# Patient Record
Sex: Female | Born: 1980 | Race: White | Hispanic: No | Smoking: Current every day smoker
Health system: Southern US, Community
[De-identification: ages and names within clinical notes are randomized; demographics above are authoritative.]

## PROBLEM LIST (undated history)

## (undated) ENCOUNTER — Inpatient Hospital Stay (HOSPITAL_COMMUNITY): Payer: Self-pay

## (undated) DIAGNOSIS — N83209 Unspecified ovarian cyst, unspecified side: Secondary | ICD-10-CM

## (undated) DIAGNOSIS — N939 Abnormal uterine and vaginal bleeding, unspecified: Secondary | ICD-10-CM

## (undated) DIAGNOSIS — IMO0002 Reserved for concepts with insufficient information to code with codable children: Secondary | ICD-10-CM

## (undated) DIAGNOSIS — R87629 Unspecified abnormal cytological findings in specimens from vagina: Secondary | ICD-10-CM

## (undated) DIAGNOSIS — R519 Headache, unspecified: Secondary | ICD-10-CM

## (undated) DIAGNOSIS — R87619 Unspecified abnormal cytological findings in specimens from cervix uteri: Secondary | ICD-10-CM

## (undated) DIAGNOSIS — F329 Major depressive disorder, single episode, unspecified: Secondary | ICD-10-CM

## (undated) DIAGNOSIS — F419 Anxiety disorder, unspecified: Secondary | ICD-10-CM

## (undated) DIAGNOSIS — F32A Depression, unspecified: Secondary | ICD-10-CM

## (undated) DIAGNOSIS — F410 Panic disorder [episodic paroxysmal anxiety] without agoraphobia: Secondary | ICD-10-CM

## (undated) DIAGNOSIS — G5790 Unspecified mononeuropathy of unspecified lower limb: Secondary | ICD-10-CM

## (undated) DIAGNOSIS — R9389 Abnormal findings on diagnostic imaging of other specified body structures: Secondary | ICD-10-CM

## (undated) DIAGNOSIS — B009 Herpesviral infection, unspecified: Secondary | ICD-10-CM

## (undated) DIAGNOSIS — N949 Unspecified condition associated with female genital organs and menstrual cycle: Secondary | ICD-10-CM

## (undated) DIAGNOSIS — G039 Meningitis, unspecified: Secondary | ICD-10-CM

## (undated) DIAGNOSIS — N764 Abscess of vulva: Secondary | ICD-10-CM

## (undated) DIAGNOSIS — N946 Dysmenorrhea, unspecified: Secondary | ICD-10-CM

## (undated) DIAGNOSIS — Z8669 Personal history of other diseases of the nervous system and sense organs: Secondary | ICD-10-CM

## (undated) HISTORY — DX: Unspecified ovarian cyst, unspecified side: N83.209

## (undated) HISTORY — DX: Headache, unspecified: R51.9

## (undated) HISTORY — DX: Abnormal uterine and vaginal bleeding, unspecified: N93.9

## (undated) HISTORY — DX: Unspecified abnormal cytological findings in specimens from vagina: R87.629

## (undated) HISTORY — DX: Dysmenorrhea, unspecified: N94.6

## (undated) HISTORY — PX: DILATION AND CURETTAGE OF UTERUS: SHX78

## (undated) HISTORY — DX: Unspecified condition associated with female genital organs and menstrual cycle: N94.9

## (undated) HISTORY — DX: Reserved for concepts with insufficient information to code with codable children: IMO0002

## (undated) HISTORY — PX: ABDOMINAL HYSTERECTOMY: SHX81

## (undated) HISTORY — DX: Herpesviral infection, unspecified: B00.9

## (undated) HISTORY — DX: Meningitis, unspecified: G03.9

## (undated) HISTORY — DX: Unspecified abnormal cytological findings in specimens from cervix uteri: R87.619

## (undated) HISTORY — DX: Abnormal findings on diagnostic imaging of other specified body structures: R93.89

## (undated) HISTORY — PX: CHOLECYSTECTOMY: SHX55

## (undated) HISTORY — DX: Abscess of vulva: N76.4

## (undated) HISTORY — DX: Personal history of other diseases of the nervous system and sense organs: Z86.69

## (undated) HISTORY — PX: OVARIAN CYST REMOVAL: SHX89

## (undated) HISTORY — PX: APPENDECTOMY: SHX54

---

## 2001-04-12 ENCOUNTER — Encounter: Payer: Self-pay | Admitting: *Deleted

## 2001-04-12 ENCOUNTER — Emergency Department (HOSPITAL_COMMUNITY): Admission: EM | Admit: 2001-04-12 | Discharge: 2001-04-12 | Payer: Self-pay | Admitting: *Deleted

## 2001-04-17 ENCOUNTER — Encounter: Payer: Self-pay | Admitting: General Surgery

## 2001-04-17 ENCOUNTER — Observation Stay (HOSPITAL_COMMUNITY): Admission: EM | Admit: 2001-04-17 | Discharge: 2001-04-18 | Payer: Self-pay | Admitting: Emergency Medicine

## 2001-09-10 ENCOUNTER — Encounter: Payer: Self-pay | Admitting: *Deleted

## 2001-09-10 ENCOUNTER — Emergency Department (HOSPITAL_COMMUNITY): Admission: EM | Admit: 2001-09-10 | Discharge: 2001-09-10 | Payer: Self-pay | Admitting: *Deleted

## 2001-12-31 ENCOUNTER — Emergency Department (HOSPITAL_COMMUNITY): Admission: EM | Admit: 2001-12-31 | Discharge: 2001-12-31 | Payer: Self-pay | Admitting: *Deleted

## 2002-04-27 ENCOUNTER — Emergency Department (HOSPITAL_COMMUNITY): Admission: EM | Admit: 2002-04-27 | Discharge: 2002-04-27 | Payer: Self-pay | Admitting: Internal Medicine

## 2002-07-05 ENCOUNTER — Ambulatory Visit (HOSPITAL_COMMUNITY): Admission: RE | Admit: 2002-07-05 | Discharge: 2002-07-05 | Payer: Self-pay | Admitting: Family Medicine

## 2002-07-05 ENCOUNTER — Encounter: Payer: Self-pay | Admitting: Family Medicine

## 2002-08-24 ENCOUNTER — Emergency Department (HOSPITAL_COMMUNITY): Admission: EM | Admit: 2002-08-24 | Discharge: 2002-08-24 | Payer: Self-pay | Admitting: Emergency Medicine

## 2002-08-29 ENCOUNTER — Emergency Department (HOSPITAL_COMMUNITY): Admission: EM | Admit: 2002-08-29 | Discharge: 2002-08-30 | Payer: Self-pay | Admitting: *Deleted

## 2002-08-30 ENCOUNTER — Encounter: Payer: Self-pay | Admitting: *Deleted

## 2002-09-04 ENCOUNTER — Emergency Department (HOSPITAL_COMMUNITY): Admission: EM | Admit: 2002-09-04 | Discharge: 2002-09-05 | Payer: Self-pay | Admitting: *Deleted

## 2002-09-05 ENCOUNTER — Encounter: Payer: Self-pay | Admitting: *Deleted

## 2002-10-18 ENCOUNTER — Emergency Department (HOSPITAL_COMMUNITY): Admission: EM | Admit: 2002-10-18 | Discharge: 2002-10-19 | Payer: Self-pay | Admitting: Emergency Medicine

## 2002-10-19 ENCOUNTER — Encounter: Payer: Self-pay | Admitting: Emergency Medicine

## 2002-12-25 ENCOUNTER — Emergency Department (HOSPITAL_COMMUNITY): Admission: EM | Admit: 2002-12-25 | Discharge: 2002-12-25 | Payer: Self-pay | Admitting: Emergency Medicine

## 2002-12-30 ENCOUNTER — Observation Stay (HOSPITAL_COMMUNITY): Admission: EM | Admit: 2002-12-30 | Discharge: 2002-12-30 | Payer: Self-pay | Admitting: Emergency Medicine

## 2002-12-30 ENCOUNTER — Encounter: Payer: Self-pay | Admitting: Obstetrics and Gynecology

## 2003-03-15 ENCOUNTER — Ambulatory Visit (HOSPITAL_COMMUNITY): Admission: RE | Admit: 2003-03-15 | Discharge: 2003-03-15 | Payer: Self-pay | Admitting: Family Medicine

## 2003-03-15 ENCOUNTER — Encounter: Payer: Self-pay | Admitting: Family Medicine

## 2003-04-17 ENCOUNTER — Encounter: Payer: Self-pay | Admitting: Emergency Medicine

## 2003-04-17 ENCOUNTER — Emergency Department (HOSPITAL_COMMUNITY): Admission: EM | Admit: 2003-04-17 | Discharge: 2003-04-17 | Payer: Self-pay | Admitting: Emergency Medicine

## 2003-09-20 ENCOUNTER — Emergency Department (HOSPITAL_COMMUNITY): Admission: EM | Admit: 2003-09-20 | Discharge: 2003-09-20 | Payer: Self-pay | Admitting: Emergency Medicine

## 2003-09-20 ENCOUNTER — Encounter: Payer: Self-pay | Admitting: Emergency Medicine

## 2003-11-30 ENCOUNTER — Emergency Department (HOSPITAL_COMMUNITY): Admission: EM | Admit: 2003-11-30 | Discharge: 2003-12-01 | Payer: Self-pay | Admitting: Emergency Medicine

## 2004-03-04 ENCOUNTER — Emergency Department (HOSPITAL_COMMUNITY): Admission: EM | Admit: 2004-03-04 | Discharge: 2004-03-04 | Payer: Self-pay | Admitting: Emergency Medicine

## 2004-06-06 ENCOUNTER — Ambulatory Visit (HOSPITAL_COMMUNITY): Admission: RE | Admit: 2004-06-06 | Discharge: 2004-06-06 | Payer: Self-pay | Admitting: Obstetrics and Gynecology

## 2004-08-31 ENCOUNTER — Emergency Department (HOSPITAL_COMMUNITY): Admission: EM | Admit: 2004-08-31 | Discharge: 2004-09-01 | Payer: Self-pay | Admitting: Emergency Medicine

## 2004-09-16 ENCOUNTER — Emergency Department (HOSPITAL_COMMUNITY): Admission: EM | Admit: 2004-09-16 | Discharge: 2004-09-16 | Payer: Self-pay | Admitting: Emergency Medicine

## 2004-11-19 ENCOUNTER — Emergency Department (HOSPITAL_COMMUNITY): Admission: EM | Admit: 2004-11-19 | Discharge: 2004-11-20 | Payer: Self-pay | Admitting: *Deleted

## 2005-01-14 ENCOUNTER — Emergency Department (HOSPITAL_COMMUNITY): Admission: EM | Admit: 2005-01-14 | Discharge: 2005-01-14 | Payer: Self-pay | Admitting: Emergency Medicine

## 2005-03-06 ENCOUNTER — Emergency Department (HOSPITAL_COMMUNITY): Admission: EM | Admit: 2005-03-06 | Discharge: 2005-03-06 | Payer: Self-pay | Admitting: Emergency Medicine

## 2005-03-22 ENCOUNTER — Emergency Department (HOSPITAL_COMMUNITY): Admission: EM | Admit: 2005-03-22 | Discharge: 2005-03-22 | Payer: Self-pay | Admitting: Emergency Medicine

## 2005-08-27 ENCOUNTER — Emergency Department (HOSPITAL_COMMUNITY): Admission: EM | Admit: 2005-08-27 | Discharge: 2005-08-27 | Payer: Self-pay | Admitting: Emergency Medicine

## 2005-11-04 ENCOUNTER — Emergency Department (HOSPITAL_COMMUNITY): Admission: EM | Admit: 2005-11-04 | Discharge: 2005-11-04 | Payer: Self-pay | Admitting: Emergency Medicine

## 2006-02-15 ENCOUNTER — Emergency Department (HOSPITAL_COMMUNITY): Admission: EM | Admit: 2006-02-15 | Discharge: 2006-02-15 | Payer: Self-pay | Admitting: Emergency Medicine

## 2006-02-26 ENCOUNTER — Emergency Department (HOSPITAL_COMMUNITY): Admission: EM | Admit: 2006-02-26 | Discharge: 2006-02-27 | Payer: Self-pay | Admitting: Emergency Medicine

## 2006-06-04 ENCOUNTER — Emergency Department (HOSPITAL_COMMUNITY): Admission: EM | Admit: 2006-06-04 | Discharge: 2006-06-04 | Payer: Self-pay | Admitting: Emergency Medicine

## 2006-07-31 ENCOUNTER — Emergency Department (HOSPITAL_COMMUNITY): Admission: EM | Admit: 2006-07-31 | Discharge: 2006-07-31 | Payer: Self-pay | Admitting: Emergency Medicine

## 2006-08-19 ENCOUNTER — Emergency Department (HOSPITAL_COMMUNITY): Admission: EM | Admit: 2006-08-19 | Discharge: 2006-08-19 | Payer: Self-pay | Admitting: Emergency Medicine

## 2006-12-01 ENCOUNTER — Emergency Department (HOSPITAL_COMMUNITY): Admission: EM | Admit: 2006-12-01 | Discharge: 2006-12-01 | Payer: Self-pay | Admitting: Emergency Medicine

## 2007-01-04 ENCOUNTER — Emergency Department (HOSPITAL_COMMUNITY): Admission: EM | Admit: 2007-01-04 | Discharge: 2007-01-04 | Payer: Self-pay | Admitting: Emergency Medicine

## 2007-02-05 ENCOUNTER — Emergency Department (HOSPITAL_COMMUNITY): Admission: EM | Admit: 2007-02-05 | Discharge: 2007-02-06 | Payer: Self-pay | Admitting: Emergency Medicine

## 2007-03-11 ENCOUNTER — Emergency Department (HOSPITAL_COMMUNITY): Admission: EM | Admit: 2007-03-11 | Discharge: 2007-03-11 | Payer: Self-pay | Admitting: Emergency Medicine

## 2007-04-18 ENCOUNTER — Emergency Department (HOSPITAL_COMMUNITY): Admission: EM | Admit: 2007-04-18 | Discharge: 2007-04-18 | Payer: Self-pay | Admitting: Emergency Medicine

## 2007-04-19 ENCOUNTER — Ambulatory Visit (HOSPITAL_COMMUNITY): Admission: RE | Admit: 2007-04-19 | Discharge: 2007-04-19 | Payer: Self-pay | Admitting: Emergency Medicine

## 2007-05-30 ENCOUNTER — Emergency Department (HOSPITAL_COMMUNITY): Admission: EM | Admit: 2007-05-30 | Discharge: 2007-05-30 | Payer: Self-pay | Admitting: Emergency Medicine

## 2007-05-30 ENCOUNTER — Emergency Department (HOSPITAL_COMMUNITY): Admission: EM | Admit: 2007-05-30 | Discharge: 2007-05-31 | Payer: Self-pay | Admitting: Emergency Medicine

## 2007-06-03 ENCOUNTER — Emergency Department (HOSPITAL_COMMUNITY): Admission: EM | Admit: 2007-06-03 | Discharge: 2007-06-04 | Payer: Self-pay | Admitting: Emergency Medicine

## 2007-08-21 ENCOUNTER — Emergency Department (HOSPITAL_COMMUNITY): Admission: EM | Admit: 2007-08-21 | Discharge: 2007-08-21 | Payer: Self-pay | Admitting: Obstetrics and Gynecology

## 2007-08-23 ENCOUNTER — Emergency Department (HOSPITAL_COMMUNITY): Admission: EM | Admit: 2007-08-23 | Discharge: 2007-08-23 | Payer: Self-pay | Admitting: Emergency Medicine

## 2007-10-27 ENCOUNTER — Emergency Department (HOSPITAL_COMMUNITY): Admission: EM | Admit: 2007-10-27 | Discharge: 2007-10-27 | Payer: Self-pay | Admitting: Emergency Medicine

## 2007-11-26 ENCOUNTER — Emergency Department (HOSPITAL_COMMUNITY): Admission: EM | Admit: 2007-11-26 | Discharge: 2007-11-26 | Payer: Self-pay | Admitting: Emergency Medicine

## 2007-12-07 ENCOUNTER — Emergency Department (HOSPITAL_COMMUNITY): Admission: EM | Admit: 2007-12-07 | Discharge: 2007-12-07 | Payer: Self-pay | Admitting: *Deleted

## 2008-01-21 ENCOUNTER — Emergency Department (HOSPITAL_COMMUNITY): Admission: EM | Admit: 2008-01-21 | Discharge: 2008-01-21 | Payer: Self-pay | Admitting: Emergency Medicine

## 2008-01-30 ENCOUNTER — Emergency Department (HOSPITAL_COMMUNITY): Admission: EM | Admit: 2008-01-30 | Discharge: 2008-01-30 | Payer: Self-pay | Admitting: Emergency Medicine

## 2008-02-23 ENCOUNTER — Emergency Department (HOSPITAL_COMMUNITY): Admission: EM | Admit: 2008-02-23 | Discharge: 2008-02-23 | Payer: Self-pay | Admitting: Emergency Medicine

## 2008-03-02 ENCOUNTER — Emergency Department (HOSPITAL_COMMUNITY): Admission: EM | Admit: 2008-03-02 | Discharge: 2008-03-03 | Payer: Self-pay | Admitting: Emergency Medicine

## 2008-04-20 ENCOUNTER — Emergency Department (HOSPITAL_COMMUNITY): Admission: EM | Admit: 2008-04-20 | Discharge: 2008-04-21 | Payer: Self-pay | Admitting: Emergency Medicine

## 2008-04-21 ENCOUNTER — Emergency Department (HOSPITAL_COMMUNITY): Admission: EM | Admit: 2008-04-21 | Discharge: 2008-04-21 | Payer: Self-pay | Admitting: Emergency Medicine

## 2008-05-04 ENCOUNTER — Emergency Department (HOSPITAL_COMMUNITY): Admission: EM | Admit: 2008-05-04 | Discharge: 2008-05-04 | Payer: Self-pay | Admitting: Emergency Medicine

## 2008-05-16 ENCOUNTER — Emergency Department (HOSPITAL_COMMUNITY): Admission: EM | Admit: 2008-05-16 | Discharge: 2008-05-17 | Payer: Self-pay | Admitting: Emergency Medicine

## 2008-05-18 ENCOUNTER — Emergency Department (HOSPITAL_COMMUNITY): Admission: EM | Admit: 2008-05-18 | Discharge: 2008-05-18 | Payer: Self-pay | Admitting: Emergency Medicine

## 2008-05-30 ENCOUNTER — Emergency Department (HOSPITAL_COMMUNITY): Admission: EM | Admit: 2008-05-30 | Discharge: 2008-05-30 | Payer: Self-pay | Admitting: Emergency Medicine

## 2008-09-01 ENCOUNTER — Emergency Department (HOSPITAL_COMMUNITY): Admission: EM | Admit: 2008-09-01 | Discharge: 2008-09-02 | Payer: Self-pay | Admitting: Emergency Medicine

## 2008-09-20 ENCOUNTER — Emergency Department (HOSPITAL_COMMUNITY): Admission: EM | Admit: 2008-09-20 | Discharge: 2008-09-20 | Payer: Self-pay | Admitting: Emergency Medicine

## 2009-01-22 ENCOUNTER — Emergency Department (HOSPITAL_COMMUNITY): Admission: EM | Admit: 2009-01-22 | Discharge: 2009-01-22 | Payer: Self-pay | Admitting: Emergency Medicine

## 2009-05-06 ENCOUNTER — Emergency Department (HOSPITAL_COMMUNITY): Admission: EM | Admit: 2009-05-06 | Discharge: 2009-05-06 | Payer: Self-pay | Admitting: Emergency Medicine

## 2009-07-10 ENCOUNTER — Emergency Department (HOSPITAL_COMMUNITY): Admission: EM | Admit: 2009-07-10 | Discharge: 2009-07-10 | Payer: Self-pay | Admitting: Emergency Medicine

## 2009-09-11 ENCOUNTER — Emergency Department (HOSPITAL_COMMUNITY): Admission: EM | Admit: 2009-09-11 | Discharge: 2009-09-11 | Payer: Self-pay | Admitting: Emergency Medicine

## 2009-10-13 ENCOUNTER — Emergency Department (HOSPITAL_COMMUNITY): Admission: EM | Admit: 2009-10-13 | Discharge: 2009-10-13 | Payer: Self-pay | Admitting: Emergency Medicine

## 2010-01-12 ENCOUNTER — Emergency Department (HOSPITAL_COMMUNITY): Admission: EM | Admit: 2010-01-12 | Discharge: 2010-01-12 | Payer: Self-pay | Admitting: Emergency Medicine

## 2010-03-15 ENCOUNTER — Emergency Department (HOSPITAL_COMMUNITY): Admission: EM | Admit: 2010-03-15 | Discharge: 2010-03-15 | Payer: Self-pay | Admitting: Emergency Medicine

## 2010-05-02 ENCOUNTER — Emergency Department (HOSPITAL_COMMUNITY): Admission: EM | Admit: 2010-05-02 | Discharge: 2010-05-03 | Payer: Self-pay | Admitting: Emergency Medicine

## 2010-05-03 ENCOUNTER — Emergency Department (HOSPITAL_COMMUNITY): Admission: EM | Admit: 2010-05-03 | Discharge: 2010-05-04 | Payer: Self-pay | Admitting: Emergency Medicine

## 2010-05-28 ENCOUNTER — Inpatient Hospital Stay (HOSPITAL_COMMUNITY): Admission: AD | Admit: 2010-05-28 | Discharge: 2010-05-29 | Payer: Self-pay | Admitting: Obstetrics & Gynecology

## 2010-06-02 ENCOUNTER — Ambulatory Visit: Payer: Self-pay | Admitting: Advanced Practice Midwife

## 2010-06-02 ENCOUNTER — Inpatient Hospital Stay (HOSPITAL_COMMUNITY): Admission: AD | Admit: 2010-06-02 | Discharge: 2010-06-03 | Payer: Self-pay | Admitting: Internal Medicine

## 2010-06-13 ENCOUNTER — Inpatient Hospital Stay (HOSPITAL_COMMUNITY): Admission: AD | Admit: 2010-06-13 | Discharge: 2010-06-13 | Payer: Self-pay | Admitting: Obstetrics & Gynecology

## 2010-06-13 ENCOUNTER — Ambulatory Visit: Payer: Self-pay | Admitting: Gynecology

## 2010-06-17 ENCOUNTER — Other Ambulatory Visit: Admission: RE | Admit: 2010-06-17 | Discharge: 2010-06-17 | Payer: Self-pay | Admitting: Obstetrics and Gynecology

## 2010-06-20 ENCOUNTER — Inpatient Hospital Stay (HOSPITAL_COMMUNITY): Admission: AD | Admit: 2010-06-20 | Discharge: 2010-06-20 | Payer: Self-pay | Admitting: Obstetrics & Gynecology

## 2010-06-20 ENCOUNTER — Ambulatory Visit: Payer: Self-pay | Admitting: Physician Assistant

## 2010-07-02 ENCOUNTER — Inpatient Hospital Stay (HOSPITAL_COMMUNITY): Admission: AD | Admit: 2010-07-02 | Discharge: 2010-07-03 | Payer: Self-pay | Admitting: Obstetrics and Gynecology

## 2010-07-11 ENCOUNTER — Inpatient Hospital Stay (HOSPITAL_COMMUNITY): Admission: AD | Admit: 2010-07-11 | Discharge: 2010-07-11 | Payer: Self-pay | Admitting: Obstetrics & Gynecology

## 2010-07-11 ENCOUNTER — Ambulatory Visit: Payer: Self-pay | Admitting: Physician Assistant

## 2010-08-15 ENCOUNTER — Inpatient Hospital Stay (HOSPITAL_COMMUNITY): Admission: AD | Admit: 2010-08-15 | Discharge: 2010-08-15 | Payer: Self-pay | Admitting: Family Medicine

## 2010-08-15 ENCOUNTER — Ambulatory Visit: Payer: Self-pay | Admitting: Family Medicine

## 2010-08-20 ENCOUNTER — Inpatient Hospital Stay (HOSPITAL_COMMUNITY): Admission: AD | Admit: 2010-08-20 | Discharge: 2010-08-20 | Payer: Self-pay | Admitting: Obstetrics & Gynecology

## 2010-08-23 ENCOUNTER — Inpatient Hospital Stay (HOSPITAL_COMMUNITY): Admission: AD | Admit: 2010-08-23 | Discharge: 2010-08-23 | Payer: Self-pay | Admitting: Obstetrics & Gynecology

## 2010-08-24 ENCOUNTER — Inpatient Hospital Stay (HOSPITAL_COMMUNITY): Admission: AD | Admit: 2010-08-24 | Discharge: 2010-08-29 | Payer: Self-pay | Admitting: Obstetrics & Gynecology

## 2010-09-21 ENCOUNTER — Ambulatory Visit: Payer: Self-pay | Admitting: Obstetrics and Gynecology

## 2010-09-21 ENCOUNTER — Inpatient Hospital Stay (HOSPITAL_COMMUNITY): Admission: AD | Admit: 2010-09-21 | Discharge: 2010-09-21 | Payer: Self-pay | Admitting: Obstetrics & Gynecology

## 2010-10-28 ENCOUNTER — Inpatient Hospital Stay (HOSPITAL_COMMUNITY): Admission: AD | Admit: 2010-10-28 | Discharge: 2010-10-28 | Payer: Self-pay | Admitting: Obstetrics & Gynecology

## 2010-11-02 ENCOUNTER — Inpatient Hospital Stay (HOSPITAL_COMMUNITY): Admission: AD | Admit: 2010-11-02 | Discharge: 2010-11-02 | Payer: Self-pay | Admitting: Obstetrics & Gynecology

## 2010-11-24 ENCOUNTER — Inpatient Hospital Stay (HOSPITAL_COMMUNITY): Admission: AD | Admit: 2010-11-24 | Discharge: 2010-11-24 | Payer: Self-pay | Admitting: Obstetrics & Gynecology

## 2010-11-30 ENCOUNTER — Inpatient Hospital Stay (HOSPITAL_COMMUNITY)
Admission: AD | Admit: 2010-11-30 | Discharge: 2010-11-30 | Payer: Self-pay | Source: Home / Self Care | Admitting: Obstetrics & Gynecology

## 2010-12-02 ENCOUNTER — Inpatient Hospital Stay (HOSPITAL_COMMUNITY)
Admission: AD | Admit: 2010-12-02 | Discharge: 2010-12-02 | Payer: Self-pay | Source: Home / Self Care | Admitting: Obstetrics and Gynecology

## 2010-12-04 ENCOUNTER — Inpatient Hospital Stay (HOSPITAL_COMMUNITY)
Admission: AD | Admit: 2010-12-04 | Discharge: 2010-12-04 | Payer: Self-pay | Source: Home / Self Care | Admitting: Obstetrics and Gynecology

## 2010-12-05 ENCOUNTER — Emergency Department (HOSPITAL_COMMUNITY): Admission: EM | Admit: 2010-12-05 | Discharge: 2010-04-19 | Payer: Self-pay | Admitting: Emergency Medicine

## 2010-12-08 ENCOUNTER — Encounter: Payer: Self-pay | Admitting: Obstetrics & Gynecology

## 2010-12-08 ENCOUNTER — Inpatient Hospital Stay (HOSPITAL_COMMUNITY)
Admission: AD | Admit: 2010-12-08 | Discharge: 2010-12-11 | Payer: Self-pay | Source: Home / Self Care | Attending: Obstetrics & Gynecology | Admitting: Obstetrics & Gynecology

## 2011-01-19 ENCOUNTER — Encounter: Payer: Self-pay | Admitting: Emergency Medicine

## 2011-01-25 ENCOUNTER — Emergency Department (HOSPITAL_COMMUNITY)
Admission: EM | Admit: 2011-01-25 | Discharge: 2011-01-26 | Payer: Self-pay | Source: Home / Self Care | Admitting: Emergency Medicine

## 2011-01-25 ENCOUNTER — Emergency Department (HOSPITAL_COMMUNITY)
Admission: EM | Admit: 2011-01-25 | Discharge: 2011-01-25 | Payer: Self-pay | Source: Home / Self Care | Admitting: Emergency Medicine

## 2011-02-05 ENCOUNTER — Emergency Department (HOSPITAL_COMMUNITY)
Admission: EM | Admit: 2011-02-05 | Discharge: 2011-02-05 | Disposition: A | Discharge status: Home / Self Care | Payer: BC Managed Care – PPO | Attending: Emergency Medicine | Admitting: Emergency Medicine

## 2011-02-05 DIAGNOSIS — Y929 Unspecified place or not applicable: Secondary | ICD-10-CM | POA: Insufficient documentation

## 2011-02-05 DIAGNOSIS — J45909 Unspecified asthma, uncomplicated: Secondary | ICD-10-CM | POA: Insufficient documentation

## 2011-02-05 DIAGNOSIS — X58XXXA Exposure to other specified factors, initial encounter: Secondary | ICD-10-CM | POA: Insufficient documentation

## 2011-02-05 DIAGNOSIS — M545 Low back pain, unspecified: Secondary | ICD-10-CM | POA: Insufficient documentation

## 2011-02-05 DIAGNOSIS — S335XXA Sprain of ligaments of lumbar spine, initial encounter: Secondary | ICD-10-CM | POA: Insufficient documentation

## 2011-02-05 LAB — PREGNANCY, URINE: Preg Test, Ur: NEGATIVE

## 2011-02-05 LAB — URINALYSIS, ROUTINE W REFLEX MICROSCOPIC
Nitrite: NEGATIVE
Specific Gravity, Urine: 1.015 (ref 1.005–1.030)
pH: 7 (ref 5.0–8.0)

## 2011-02-22 ENCOUNTER — Emergency Department (HOSPITAL_COMMUNITY)
Admission: EM | Admit: 2011-02-22 | Discharge: 2011-02-22 | Disposition: A | Discharge status: Home / Self Care | Payer: BC Managed Care – PPO | Attending: Emergency Medicine | Admitting: Emergency Medicine

## 2011-02-22 ENCOUNTER — Emergency Department (HOSPITAL_COMMUNITY): Payer: BC Managed Care – PPO

## 2011-02-22 DIAGNOSIS — N760 Acute vaginitis: Secondary | ICD-10-CM | POA: Insufficient documentation

## 2011-02-22 DIAGNOSIS — N898 Other specified noninflammatory disorders of vagina: Secondary | ICD-10-CM | POA: Insufficient documentation

## 2011-02-22 DIAGNOSIS — R109 Unspecified abdominal pain: Secondary | ICD-10-CM | POA: Insufficient documentation

## 2011-02-22 DIAGNOSIS — B9689 Other specified bacterial agents as the cause of diseases classified elsewhere: Secondary | ICD-10-CM | POA: Insufficient documentation

## 2011-02-22 DIAGNOSIS — A499 Bacterial infection, unspecified: Secondary | ICD-10-CM | POA: Insufficient documentation

## 2011-02-22 LAB — DIFFERENTIAL
Basophils Absolute: 0 10*3/uL (ref 0.0–0.1)
Basophils Relative: 0 % (ref 0–1)
Neutro Abs: 7.3 10*3/uL (ref 1.7–7.7)
Neutrophils Relative %: 75 % (ref 43–77)

## 2011-02-22 LAB — COMPREHENSIVE METABOLIC PANEL
AST: 18 U/L (ref 0–37)
Albumin: 3.9 g/dL (ref 3.5–5.2)
Alkaline Phosphatase: 61 U/L (ref 39–117)
Chloride: 108 mEq/L (ref 96–112)
Creatinine, Ser: 0.67 mg/dL (ref 0.4–1.2)
GFR calc Af Amer: >60 mL/min (ref >60)
Potassium: 3.7 mEq/L (ref 3.5–5.1)
Total Bilirubin: 0.6 mg/dL (ref 0.3–1.2)
Total Protein: 6.8 g/dL (ref 6.0–8.3)

## 2011-02-22 LAB — URINALYSIS, ROUTINE W REFLEX MICROSCOPIC
Specific Gravity, Urine: >1.03 — ABNORMAL HIGH (ref 1.005–1.030)
Urine Glucose, Fasting: NEGATIVE mg/dL
Urobilinogen, UA: 0.2 mg/dL (ref 0.0–1.0)

## 2011-02-22 LAB — CBC
Hemoglobin: 11.6 g/dL — ABNORMAL LOW (ref 12.0–15.0)
RBC: 4.22 MIL/uL (ref 3.87–5.11)

## 2011-02-22 LAB — WET PREP, GENITAL: Yeast Wet Prep HPF POC: NONE SEEN

## 2011-02-22 LAB — PREGNANCY, URINE: Preg Test, Ur: NEGATIVE

## 2011-02-22 LAB — URINE MICROSCOPIC-ADD ON

## 2011-02-22 LAB — POCT PREGNANCY, URINE: Preg Test, Ur: NEGATIVE

## 2011-02-22 MED ORDER — IOHEXOL 300 MG/ML  SOLN
100.0000 mL | Freq: Once | INTRAMUSCULAR | Status: AC | PRN
  Administered 2011-02-22: 100 mL via INTRAVENOUS

## 2011-03-10 ENCOUNTER — Emergency Department (HOSPITAL_COMMUNITY)
Admission: EM | Admit: 2011-03-10 | Discharge: 2011-03-10 | Disposition: A | Discharge status: Home / Self Care | Payer: BC Managed Care – PPO | Attending: Emergency Medicine | Admitting: Emergency Medicine

## 2011-03-10 DIAGNOSIS — R112 Nausea with vomiting, unspecified: Secondary | ICD-10-CM | POA: Insufficient documentation

## 2011-03-10 DIAGNOSIS — H53149 Visual discomfort, unspecified: Secondary | ICD-10-CM | POA: Insufficient documentation

## 2011-03-10 DIAGNOSIS — G43909 Migraine, unspecified, not intractable, without status migrainosus: Secondary | ICD-10-CM | POA: Insufficient documentation

## 2011-03-11 LAB — CBC
MCHC: 34.6 g/dL (ref 30.0–36.0)
MCHC: 34.6 g/dL (ref 30.0–36.0)
Platelets: 242 10*3/uL (ref 150–400)
Platelets: 297 10*3/uL (ref 150–400)
RDW: 14.5 % (ref 11.5–15.5)
RDW: 14.6 % (ref 11.5–15.5)
WBC: 11.7 10*3/uL — ABNORMAL HIGH (ref 4.0–10.5)
WBC: 15.1 10*3/uL — ABNORMAL HIGH (ref 4.0–10.5)

## 2011-03-11 LAB — KLEIHAUER-BETKE STAIN: Quantitation Fetal Hemoglobin: 15 mL

## 2011-03-11 LAB — URINALYSIS, ROUTINE W REFLEX MICROSCOPIC
Glucose, UA: NEGATIVE mg/dL
Glucose, UA: NEGATIVE mg/dL
Hgb urine dipstick: NEGATIVE
Hgb urine dipstick: NEGATIVE
Ketones, ur: NEGATIVE mg/dL
Protein, ur: NEGATIVE mg/dL
Specific Gravity, Urine: 1.01 (ref 1.005–1.030)
Urobilinogen, UA: 0.2 mg/dL (ref 0.0–1.0)
pH: 8 (ref 5.0–8.0)

## 2011-03-11 LAB — WET PREP, GENITAL
Clue Cells Wet Prep HPF POC: NONE SEEN
Trich, Wet Prep: NONE SEEN
Yeast Wet Prep HPF POC: NONE SEEN

## 2011-03-11 LAB — GLUCOSE, CAPILLARY: Glucose-Capillary: 79 mg/dL (ref 70–99)

## 2011-03-12 LAB — URINALYSIS, ROUTINE W REFLEX MICROSCOPIC
Bilirubin Urine: NEGATIVE
Hgb urine dipstick: NEGATIVE
Ketones, ur: NEGATIVE mg/dL
Nitrite: NEGATIVE
Specific Gravity, Urine: 1.015 (ref 1.005–1.030)
pH: 6 (ref 5.0–8.0)

## 2011-03-12 LAB — URINE MICROSCOPIC-ADD ON

## 2011-03-12 LAB — WET PREP, GENITAL

## 2011-03-13 LAB — URINALYSIS, ROUTINE W REFLEX MICROSCOPIC
Nitrite: NEGATIVE
Specific Gravity, Urine: >1.03 — ABNORMAL HIGH (ref 1.005–1.030)
Urobilinogen, UA: 0.2 mg/dL (ref 0.0–1.0)
pH: 6 (ref 5.0–8.0)

## 2011-03-13 LAB — CULTURE, ROUTINE-ABSCESS: Gram Stain: NONE SEEN

## 2011-03-14 LAB — PROTEIN S ACTIVITY: Protein S Activity: 59 % — ABNORMAL LOW (ref 69–129)

## 2011-03-14 LAB — COMPREHENSIVE METABOLIC PANEL
ALT: 16 U/L (ref 0–35)
ALT: 18 U/L (ref 0–35)
AST: 19 U/L (ref 0–37)
Albumin: 3.3 g/dL — ABNORMAL LOW (ref 3.5–5.2)
Alkaline Phosphatase: 45 U/L (ref 39–117)
Alkaline Phosphatase: 47 U/L (ref 39–117)
BUN: 3 mg/dL — ABNORMAL LOW (ref 6–23)
CO2: 22 mEq/L (ref 19–32)
Calcium: 8.7 mg/dL (ref 8.4–10.5)
Chloride: 105 mEq/L (ref 96–112)
Chloride: 106 mEq/L (ref 96–112)
GFR calc Af Amer: >60 mL/min (ref >60)
GFR calc non Af Amer: >60 mL/min (ref >60)
Glucose, Bld: 87 mg/dL (ref 70–99)
Glucose, Bld: 99 mg/dL (ref 70–99)
Potassium: 3.3 mEq/L — ABNORMAL LOW (ref 3.5–5.1)
Potassium: 3.4 mEq/L — ABNORMAL LOW (ref 3.5–5.1)
Sodium: 134 mEq/L — ABNORMAL LOW (ref 135–145)
Sodium: 134 mEq/L — ABNORMAL LOW (ref 135–145)
Total Bilirubin: 0.3 mg/dL (ref 0.3–1.2)
Total Bilirubin: 0.7 mg/dL (ref 0.3–1.2)
Total Protein: 6.6 g/dL (ref 6.0–8.3)

## 2011-03-14 LAB — DIFFERENTIAL
Basophils Absolute: 0 10*3/uL (ref 0.0–0.1)
Lymphocytes Relative: 12 % (ref 12–46)
Lymphocytes Relative: 6 % — ABNORMAL LOW (ref 12–46)
Lymphs Abs: 0.8 10*3/uL (ref 0.7–4.0)
Lymphs Abs: 1.1 10*3/uL (ref 0.7–4.0)
Monocytes Absolute: 0.4 10*3/uL (ref 0.1–1.0)
Monocytes Relative: 4 % (ref 3–12)
Neutro Abs: 11.8 10*3/uL — ABNORMAL HIGH (ref 1.7–7.7)
Neutro Abs: 7.5 10*3/uL (ref 1.7–7.7)

## 2011-03-14 LAB — URINALYSIS, ROUTINE W REFLEX MICROSCOPIC
Bilirubin Urine: NEGATIVE
Bilirubin Urine: NEGATIVE
Glucose, UA: NEGATIVE mg/dL
Glucose, UA: NEGATIVE mg/dL
Glucose, UA: NEGATIVE mg/dL
Hgb urine dipstick: NEGATIVE
Hgb urine dipstick: NEGATIVE
Ketones, ur: 15 mg/dL — AB
Ketones, ur: NEGATIVE mg/dL
Protein, ur: 30 mg/dL — AB
Protein, ur: NEGATIVE mg/dL
Specific Gravity, Urine: >1.03 — ABNORMAL HIGH (ref 1.005–1.030)
pH: 5.5 (ref 5.0–8.0)
pH: 6 (ref 5.0–8.0)

## 2011-03-14 LAB — WOUND CULTURE

## 2011-03-14 LAB — CBC
HCT: 27.3 % — ABNORMAL LOW (ref 36.0–46.0)
HCT: 29.3 % — ABNORMAL LOW (ref 36.0–46.0)
HCT: 31.1 % — ABNORMAL LOW (ref 36.0–46.0)
Hemoglobin: 10.8 g/dL — ABNORMAL LOW (ref 12.0–15.0)
Hemoglobin: 9.4 g/dL — ABNORMAL LOW (ref 12.0–15.0)
MCHC: 34.8 g/dL (ref 30.0–36.0)
MCV: 87.4 fL (ref 78.0–100.0)
MCV: 87.8 fL (ref 78.0–100.0)
Platelets: 216 10*3/uL (ref 150–400)
RBC: 3.34 MIL/uL — ABNORMAL LOW (ref 3.87–5.11)
RDW: 13.5 % (ref 11.5–15.5)
RDW: 13.7 % (ref 11.5–15.5)
WBC: 13.1 10*3/uL — ABNORMAL HIGH (ref 4.0–10.5)
WBC: 9 10*3/uL (ref 4.0–10.5)

## 2011-03-14 LAB — ANTITHROMBIN III: AntiThromb III Func: 87 % (ref 76–126)

## 2011-03-14 LAB — BETA-2-GLYCOPROTEIN I ABS, IGG/M/A: Beta-2-Glycoprotein I IgA: 0 A Units (ref <20)

## 2011-03-14 LAB — FACTOR 5 LEIDEN

## 2011-03-14 LAB — LUPUS ANTICOAGULANT PANEL
DRVVT: 51.8 secs — ABNORMAL HIGH (ref 36.2–44.3)
Lupus Anticoagulant: DETECTED — AB
PTT Lupus Anticoagulant: 53.5 secs — ABNORMAL HIGH (ref 30.0–45.6)
PTTLA 4:1 Mix: 48.3 secs — ABNORMAL HIGH (ref 30.0–45.6)

## 2011-03-14 LAB — URINE MICROSCOPIC-ADD ON

## 2011-03-14 LAB — PROTEIN C ACTIVITY: Protein C Activity: 141 % — ABNORMAL HIGH (ref 75–133)

## 2011-03-14 LAB — CK TOTAL AND CKMB (NOT AT ARMC): CK, MB: 0.5 ng/mL (ref 0.3–4.0)

## 2011-03-14 LAB — LIPASE, BLOOD: Lipase: 21 U/L (ref 11–59)

## 2011-03-14 LAB — HOMOCYSTEINE: Homocysteine: 3.1 umol/L — ABNORMAL LOW (ref 4.0–15.4)

## 2011-03-14 LAB — PROTHROMBIN GENE MUTATION

## 2011-03-14 LAB — PROTEIN C, TOTAL: Protein C, Total: 92 % (ref 70–140)

## 2011-03-14 LAB — CREATININE, SERUM: GFR calc non Af Amer: >60 mL/min (ref >60)

## 2011-03-16 ENCOUNTER — Emergency Department (HOSPITAL_COMMUNITY)
Admission: EM | Admit: 2011-03-16 | Discharge: 2011-03-16 | Disposition: A | Discharge status: Home / Self Care | Payer: BC Managed Care – PPO | Attending: Emergency Medicine | Admitting: Emergency Medicine

## 2011-03-16 DIAGNOSIS — R51 Headache: Secondary | ICD-10-CM | POA: Insufficient documentation

## 2011-03-16 DIAGNOSIS — R109 Unspecified abdominal pain: Secondary | ICD-10-CM | POA: Insufficient documentation

## 2011-03-16 DIAGNOSIS — R112 Nausea with vomiting, unspecified: Secondary | ICD-10-CM | POA: Insufficient documentation

## 2011-03-16 DIAGNOSIS — R197 Diarrhea, unspecified: Secondary | ICD-10-CM | POA: Insufficient documentation

## 2011-03-16 DIAGNOSIS — F411 Generalized anxiety disorder: Secondary | ICD-10-CM | POA: Insufficient documentation

## 2011-03-16 DIAGNOSIS — R10819 Abdominal tenderness, unspecified site: Secondary | ICD-10-CM | POA: Insufficient documentation

## 2011-03-16 DIAGNOSIS — J45909 Unspecified asthma, uncomplicated: Secondary | ICD-10-CM | POA: Insufficient documentation

## 2011-03-16 DIAGNOSIS — Z79899 Other long term (current) drug therapy: Secondary | ICD-10-CM | POA: Insufficient documentation

## 2011-03-16 DIAGNOSIS — K5289 Other specified noninfective gastroenteritis and colitis: Secondary | ICD-10-CM | POA: Insufficient documentation

## 2011-03-16 LAB — URINALYSIS, ROUTINE W REFLEX MICROSCOPIC
Bilirubin Urine: NEGATIVE
Bilirubin Urine: NEGATIVE
Glucose, UA: NEGATIVE mg/dL
Glucose, UA: NEGATIVE mg/dL
Glucose, UA: NEGATIVE mg/dL
Glucose, UA: NEGATIVE mg/dL
Hgb urine dipstick: NEGATIVE
Hgb urine dipstick: NEGATIVE
Hgb urine dipstick: NEGATIVE
Specific Gravity, Urine: 1.025 (ref 1.005–1.030)
Specific Gravity, Urine: >1.03 — ABNORMAL HIGH (ref 1.005–1.030)
Specific Gravity, Urine: 1.015 (ref 1.005–1.030)
Specific Gravity, Urine: 1.02 (ref 1.005–1.030)
Specific Gravity, Urine: >1.03 — ABNORMAL HIGH (ref 1.005–1.030)
Urobilinogen, UA: 0.2 mg/dL (ref 0.0–1.0)
pH: 6 (ref 5.0–8.0)
pH: 5.5 (ref 5.0–8.0)

## 2011-03-16 LAB — URINE MICROSCOPIC-ADD ON

## 2011-03-16 LAB — DIFFERENTIAL
Basophils Relative: 1 % (ref 0–1)
Lymphocytes Relative: 25 % (ref 12–46)
Lymphs Abs: 1.6 10*3/uL (ref 0.7–4.0)
Monocytes Relative: 6 % (ref 3–12)
Neutro Abs: 4.1 10*3/uL (ref 1.7–7.7)
Neutrophils Relative %: 64 % (ref 43–77)

## 2011-03-16 LAB — COMPREHENSIVE METABOLIC PANEL
ALT: 28 U/L (ref 0–35)
Albumin: 3.6 g/dL (ref 3.5–5.2)
Alkaline Phosphatase: 73 U/L (ref 39–117)
Chloride: 109 mEq/L (ref 96–112)
Glucose, Bld: 85 mg/dL (ref 70–99)
Potassium: 3.9 mEq/L (ref 3.5–5.1)
Sodium: 138 mEq/L (ref 135–145)
Total Bilirubin: 0.7 mg/dL (ref 0.3–1.2)
Total Protein: 6.8 g/dL (ref 6.0–8.3)

## 2011-03-16 LAB — URINE CULTURE

## 2011-03-16 LAB — WET PREP, GENITAL: Clue Cells Wet Prep HPF POC: NONE SEEN

## 2011-03-16 LAB — LIPASE, BLOOD: Lipase: 19 U/L (ref 11–59)

## 2011-03-17 LAB — COMPREHENSIVE METABOLIC PANEL
ALT: 36 U/L — ABNORMAL HIGH (ref 0–35)
AST: 23 U/L (ref 0–37)
Alkaline Phosphatase: 42 U/L (ref 39–117)
CO2: 24 mEq/L (ref 19–32)
Chloride: 105 mEq/L (ref 96–112)
Creatinine, Ser: 0.48 mg/dL (ref 0.4–1.2)
GFR calc Af Amer: >60 mL/min (ref >60)
GFR calc non Af Amer: >60 mL/min (ref >60)
Potassium: 3.2 mEq/L — ABNORMAL LOW (ref 3.5–5.1)
Total Bilirubin: 0.2 mg/dL — ABNORMAL LOW (ref 0.3–1.2)

## 2011-03-17 LAB — URINALYSIS, ROUTINE W REFLEX MICROSCOPIC
Bilirubin Urine: NEGATIVE
Glucose, UA: NEGATIVE mg/dL
Hgb urine dipstick: NEGATIVE
Ketones, ur: NEGATIVE mg/dL
Protein, ur: NEGATIVE mg/dL
Urobilinogen, UA: 0.2 mg/dL (ref 0.0–1.0)

## 2011-03-17 LAB — DIFFERENTIAL
Basophils Relative: 0 % (ref 0–1)
Eosinophils Absolute: 0.3 10*3/uL (ref 0.0–0.7)
Eosinophils Relative: 3 % (ref 0–5)
Neutrophils Relative %: 58 % (ref 43–77)

## 2011-03-17 LAB — CBC
HCT: 33.3 % — ABNORMAL LOW (ref 36.0–46.0)
MCHC: 34.4 g/dL (ref 30.0–36.0)
MCV: 86.8 fL (ref 78.0–100.0)
Platelets: 242 10*3/uL (ref 150–400)
RDW: 12.8 % (ref 11.5–15.5)

## 2011-03-27 ENCOUNTER — Emergency Department (HOSPITAL_COMMUNITY)
Admission: EM | Admit: 2011-03-27 | Discharge: 2011-03-28 | Disposition: A | Discharge status: Home / Self Care | Payer: No Typology Code available for payment source | Attending: Emergency Medicine | Admitting: Emergency Medicine

## 2011-03-27 DIAGNOSIS — M542 Cervicalgia: Secondary | ICD-10-CM | POA: Insufficient documentation

## 2011-03-27 DIAGNOSIS — Y92410 Unspecified street and highway as the place of occurrence of the external cause: Secondary | ICD-10-CM | POA: Insufficient documentation

## 2011-03-27 DIAGNOSIS — Z043 Encounter for examination and observation following other accident: Secondary | ICD-10-CM | POA: Insufficient documentation

## 2011-03-28 ENCOUNTER — Emergency Department (HOSPITAL_COMMUNITY): Payer: No Typology Code available for payment source

## 2011-04-03 LAB — URINALYSIS, ROUTINE W REFLEX MICROSCOPIC
Bilirubin Urine: NEGATIVE
Ketones, ur: NEGATIVE mg/dL
Nitrite: NEGATIVE
Protein, ur: NEGATIVE mg/dL
pH: 6 (ref 5.0–8.0)

## 2011-04-03 LAB — PREGNANCY, URINE: Preg Test, Ur: NEGATIVE

## 2011-04-06 LAB — WET PREP, GENITAL
Trich, Wet Prep: NONE SEEN
WBC, Wet Prep HPF POC: NONE SEEN
Yeast Wet Prep HPF POC: NONE SEEN

## 2011-04-06 LAB — DIFFERENTIAL
Basophils Relative: 1 % (ref 0–1)
Lymphocytes Relative: 27 % (ref 12–46)
Lymphocytes Relative: 28 % (ref 12–46)
Lymphs Abs: 2 10*3/uL (ref 0.7–4.0)
Lymphs Abs: 2.3 10*3/uL (ref 0.7–4.0)
Monocytes Absolute: 0.4 10*3/uL (ref 0.1–1.0)
Monocytes Relative: 5 % (ref 3–12)
Monocytes Relative: 5 % (ref 3–12)
Neutro Abs: 4.6 10*3/uL (ref 1.7–7.7)
Neutro Abs: 5.7 10*3/uL (ref 1.7–7.7)
Neutrophils Relative %: 63 % (ref 43–77)
Neutrophils Relative %: 65 % (ref 43–77)

## 2011-04-06 LAB — URINALYSIS, ROUTINE W REFLEX MICROSCOPIC
Bilirubin Urine: NEGATIVE
Glucose, UA: NEGATIVE mg/dL
Hgb urine dipstick: NEGATIVE
Specific Gravity, Urine: 1.03 (ref 1.005–1.030)
Urobilinogen, UA: 0.2 mg/dL (ref 0.0–1.0)

## 2011-04-06 LAB — CBC
Hemoglobin: 12.4 g/dL (ref 12.0–15.0)
MCHC: 35.3 g/dL (ref 30.0–36.0)
MCV: 85.3 fL (ref 78.0–100.0)
Platelets: 279 10*3/uL (ref 150–400)
RBC: 3.94 MIL/uL (ref 3.87–5.11)
RBC: 4.11 MIL/uL (ref 3.87–5.11)
WBC: 7.2 10*3/uL (ref 4.0–10.5)
WBC: 8.7 10*3/uL (ref 4.0–10.5)

## 2011-04-06 LAB — GC/CHLAMYDIA PROBE AMP, GENITAL
Chlamydia, DNA Probe: NEGATIVE
GC Probe Amp, Genital: NEGATIVE

## 2011-04-06 LAB — HCG, QUANTITATIVE, PREGNANCY: hCG, Beta Chain, Quant, S: <2 m[IU]/mL (ref <5)

## 2011-04-10 ENCOUNTER — Emergency Department (HOSPITAL_COMMUNITY): Payer: BC Managed Care – PPO

## 2011-04-10 ENCOUNTER — Emergency Department (HOSPITAL_COMMUNITY)
Admission: EM | Admit: 2011-04-10 | Discharge: 2011-04-10 | Disposition: A | Discharge status: Home / Self Care | Payer: BC Managed Care – PPO | Attending: Emergency Medicine | Admitting: Emergency Medicine

## 2011-04-10 DIAGNOSIS — Y9289 Other specified places as the place of occurrence of the external cause: Secondary | ICD-10-CM | POA: Insufficient documentation

## 2011-04-10 DIAGNOSIS — J45909 Unspecified asthma, uncomplicated: Secondary | ICD-10-CM | POA: Insufficient documentation

## 2011-04-10 DIAGNOSIS — IMO0002 Reserved for concepts with insufficient information to code with codable children: Secondary | ICD-10-CM | POA: Insufficient documentation

## 2011-04-10 DIAGNOSIS — M542 Cervicalgia: Secondary | ICD-10-CM | POA: Insufficient documentation

## 2011-04-10 LAB — CBC
HCT: 37.4 % (ref 36.0–46.0)
Hemoglobin: 12.5 g/dL (ref 12.0–15.0)
MCV: 81 fL (ref 78.0–100.0)
RBC: 4.62 MIL/uL (ref 3.87–5.11)
WBC: 10.8 10*3/uL — ABNORMAL HIGH (ref 4.0–10.5)

## 2011-04-10 LAB — COMPREHENSIVE METABOLIC PANEL
ALT: 23 U/L (ref 0–35)
Alkaline Phosphatase: 63 U/L (ref 39–117)
Chloride: 105 mEq/L (ref 96–112)
Glucose, Bld: 87 mg/dL (ref 70–99)
Potassium: 2.9 mEq/L — ABNORMAL LOW (ref 3.5–5.1)
Sodium: 136 mEq/L (ref 135–145)
Total Bilirubin: 1 mg/dL (ref 0.3–1.2)
Total Protein: 7.2 g/dL (ref 6.0–8.3)

## 2011-04-10 LAB — URINALYSIS, ROUTINE W REFLEX MICROSCOPIC
Glucose, UA: NEGATIVE mg/dL
Hgb urine dipstick: NEGATIVE
Specific Gravity, Urine: >1.03 — ABNORMAL HIGH (ref 1.005–1.030)

## 2011-04-10 LAB — DIFFERENTIAL
Basophils Relative: 0 % (ref 0–1)
Eosinophils Absolute: 0.1 10*3/uL (ref 0.0–0.7)
Eosinophils Relative: 1 % (ref 0–5)
Lymphs Abs: 2.6 10*3/uL (ref 0.7–4.0)
Monocytes Relative: 5 % (ref 3–12)

## 2011-04-10 LAB — LIPASE, BLOOD: Lipase: 19 U/L (ref 11–59)

## 2011-04-10 MED ORDER — IOHEXOL 300 MG/ML  SOLN
100.0000 mL | Freq: Once | INTRAMUSCULAR | Status: AC | PRN
  Administered 2011-04-10: 100 mL via INTRAVENOUS

## 2011-04-14 LAB — POCT CARDIAC MARKERS
CKMB, poc: <1 ng/mL — ABNORMAL LOW (ref 1.0–8.0)
Troponin i, poc: <0.05 ng/mL (ref 0.00–0.09)

## 2011-04-14 LAB — D-DIMER, QUANTITATIVE: D-Dimer, Quant: 0.33 ug/mL-FEU (ref 0.00–0.48)

## 2011-05-02 ENCOUNTER — Emergency Department (HOSPITAL_COMMUNITY)
Admission: EM | Admit: 2011-05-02 | Discharge: 2011-05-03 | Disposition: A | Discharge status: Home / Self Care | Payer: BC Managed Care – PPO | Attending: Emergency Medicine | Admitting: Emergency Medicine

## 2011-05-02 DIAGNOSIS — R51 Headache: Secondary | ICD-10-CM | POA: Insufficient documentation

## 2011-05-02 DIAGNOSIS — H53149 Visual discomfort, unspecified: Secondary | ICD-10-CM | POA: Insufficient documentation

## 2011-05-02 DIAGNOSIS — F411 Generalized anxiety disorder: Secondary | ICD-10-CM | POA: Insufficient documentation

## 2011-05-02 DIAGNOSIS — R112 Nausea with vomiting, unspecified: Secondary | ICD-10-CM | POA: Insufficient documentation

## 2011-05-02 DIAGNOSIS — J45909 Unspecified asthma, uncomplicated: Secondary | ICD-10-CM | POA: Insufficient documentation

## 2011-05-16 NOTE — Op Note (Signed)
NAME:  Barbara Jennings, Barbara Jennings                          ACCOUNT NO.:  [id number]   MEDICAL RECORD NO.:  [id number]                   PATIENT TYPE:  AMB   LOCATION:  DAY                                  FACILITY:  [facility]   PHYSICIAN:  Tilda Burrow, M.D.              DATE OF BIRTH:  [birth year]   DATE OF PROCEDURE:  06/06/2004  DATE OF DISCHARGE:                                 OPERATIVE REPORT   PREOPERATIVE DIAGNOSES:  1. Right lower quadrant pain.  2. Symptomatic right ovarian cyst.   POSTOPERATIVE DIAGNOSES:  1. Painful right ovarian cyst.  2. Pelvic adhesions bilaterally.  3. Infarcted epiploic fat.   PROCEDURE:  Diagnostic laparoscopy, removal of epiploic fat appendage,  aspiration of simple right ovarian cyst, lysis of adhesions.   SURGEON:  Tilda Burrow, M.D.   ASSISTANT:  Morrie Sheldon, R.N.   ANESTHESIA:  General.   COMPLICATIONS:  None.   FINDINGS:  1. A 2-cm epiploic fat appendage in the right lower quadrant with paracytic     blood supply to the omentum.  2. Simple 4 to 5-cm right ovarian cyst, benign in characteristics.  3. Free-floating staples from the old appendectomy, located in the right     lower quadrant overlying the right uterosacral ligament, removed.  4. Thin filmy adhesions from the lateral aspects of the descending colon and     sigmoid colon to left lateral sidewall.  5. Thin adhesions from the area of the prior appendectomy to the right     sidewall.  6. No endometriosis noted in the cul-de-sac and no adhesions around the     uterosacrals.   DESCRIPTION OF PROCEDURE:  The patient was taken to the operating room and  prepped and draped for combined abdominal and vaginal procedure.   A speculum was used to grasp the cervix.  A single-tooth tenaculum was  inserted.  A Foley catheter was then placed.  The patient then had insertion  of a Veress needle periumbilically with water droplet technique used to  confirm intraperitoneal placement.   Pneumoperitoneum was achieved under 14  mmHg pressure.  The 10-mm laparoscopic trocar was introduced without  difficulty.  We then inspected the pelvis, and there was absolutely no  evidence of trauma or injury to bowel or omentum.  Inspection of the lower  pelvis allowed placement of the 12-mm suprapubic trocar under direct  visualization.  We then identified the 2-cm flat ellipse of firm fibrotic  fatty tissue with paracytic blood supply to the omentum in the right lower  quadrant.  It was overlying the cyst.  It was noted in photo #1.  We excised  the specimen after positioning it as shown in photo #2.  We inspected the  ovary on the right.  It was a simple cyst, smooth, translucent, without  excrescences or suspicion of pathology.  We then inspected the cul-de-sac  and found it clear of any  endometriosis.  The right uterosacral ligament was  identified in photo #3 and shows some fibrinous adhesions where the free-  floating staples that had fallen from the appendectomy were located in the  cul-de-sac.  These were extracted and removed.  We then proceeded to inspect  the right ovarian cyst and aspirated it as shown in photo #4.  There was  absolutely no suspicious matter in the internal portions of the cyst that  could be identified.  There was no internal solid component.  We then  proceeded with photo #5 on the left side, showing adhesions at the pelvic  brim, some of which was thought to be normal in origin; however, the lateral  aspects of the descending colon had thin adhesions to the pelvic sidewall  which were within the pelvic and abdominal gutter, and these were cut free,  allowing the sigmoid to be more mobile.  The right side was then inspected,  and there were some small attachments felt to be in the area of the  ileocecal valve area.  These were thought to represent either normal  attachments or attachments associated with post-appendicitis adhesions.  These were cut free to  increase mobility of the ileum and cecum.  We then  inspected the upper abdomen and again reconfirmed that there were no  adhesions or abnormalities upward.  Photo #7 shows the normal-appearing  ovary which easily flipped back into the pelvis.   The patient was allowed to awake and go to the recovery room after deflation  of the abdomen and irrigation of the pelvis with saline solution and closure  of the fascia at the suprapubic site with 0 Vicryl and then subcutaneous 4-0  Dexon closure of the three skin puncture sites.  (The third site was a 5-mm  trocar placed under direct visualization in the right lower quadrant.)   The patient went to the recovery room in good condition.  Once she got to  the recovery room, she was highly dramatic, with tearful complaints that she  could not go home, that she was uncomfortable, etc.  However, she desired to  go to the bathroom, went to the bathroom, did fine.  She then changed her  mind and desired to go home promptly and did so with family members.      ___________________________________________                                            Tilda Burrow, M.D.   JVF/MEDQ  D:  06/06/2004  T:  06/07/2004  Job:  [id number]

## 2011-05-16 NOTE — Op Note (Signed)
   NAME:  Barbara Jennings, Barbara Jennings                           ACCOUNT NO.:  [id number]   MEDICAL RECORD NO.:  [id number]                   PATIENT TYPE:  OBV   LOCATION:  A419                                 FACILITY:  [facility]   PHYSICIAN:  Lazaro Arms, M.D.                DATE OF BIRTH:  07-28-1981   DATE OF PROCEDURE:  12/30/2002  DATE OF DISCHARGE:                                 OPERATIVE REPORT   PREOPERATIVE DIAGNOSES:  1. Intra-uterine pregnancy at [redacted] weeks gestation.  2. Incomplete abortion.   POSTOPERATIVE DIAGNOSES:  1. Intra-uterine pregnancy at [redacted] weeks gestation.  2. Incomplete abortion.   OPERATION/PROCEDURE:  Suction, gentle sharp uterine curettage.   ANESTHESIA:  MAC.   FINDINGS:  The patient came in this morning complaining of heavy bleeding at  [redacted] weeks gestation.  She had an ultrasound that showed products of conception  debris and bleeding.  Her bleeding continued to be heavy as was her  cramping.  As a result, we proceeded with surgical management.   DESCRIPTION OF PROCEDURE:  The patient was taken to the operating room and  placed in the supine position where she underwent IV sedation.  She was  placed in the dorsal lithotomy position.  She was prepped in the usual  sterile fashion.  The cervix did not have to be dilated because it was  already dilated to allow the passage of an 8 curved suction catheter.  Three  passes were made.  The tissue was returned.  A gentle sharp curettage was  performed and one more suction curettage.  All tissue was felt to be removed  in all areas.  The patient tolerated the procedure well.  She experienced  minimal blood loss, was taken to the recovery room in good, stable  condition.  All counts were correct.  Specimen was sent to pathology and she  was given methargen 0.2 IM.                                                 Lazaro Arms, M.D.    Loraine Maple  D:  12/30/2002  T:  12/30/2002  Job:  [id number]

## 2011-05-16 NOTE — H&P (Signed)
NAME:  Barbara Jennings, Barbara Jennings                          ACCOUNT NO.:  [id number]   MEDICAL RECORD NO.:  [id number]                   PATIENT TYPE:  AMB   LOCATION:  DAY                                  FACILITY:  [facility]   PHYSICIAN:  Tilda Burrow, M.D.              DATE OF BIRTH:  1981/09/28   DATE OF ADMISSION:  06/06/2004  DATE OF DISCHARGE:                                HISTORY & PHYSICAL   ADMITTING DIAGNOSIS:  Symptomatic right ovarian cyst, incapacitating pelvic  pain.   HISTORY OF PRESENT ILLNESS:  This 30 year old female, gravida 1, miscarriage  1, has been evaluated in our office since May 24, 2004 in referral from  [facility] for severe pain.  She was seen on May 22, 2004 in  the emergency room for left lower quadrant pain perceived as worse every  morning and debilitating, the patient described as worse than the pain of  the miscarriage.  The pain has been present since her current menses began,  May 19, 2004.  She was on tramadol as well as ibuprofen and required  morphine in the emergency room.  Evaluation in the ER included an ultrasound  which identified a 4.1 x 4.2 x 3.3-cm cyst in the right ovary.  The ovary  was flipped forward.  She was seen in our office, where the pain levels were  debilitating to the patient.  She was further noted to have stable vulvar  dystrophy and vitiligo not currently a problem.  She was treated with Tylox  and this did not hold her pain so she was reassessed and scheduled for  laparoscopic removal of the cystic structure in the right ovary and possibly  the entire right ovary.  Preop course also notable for diffuse urticaria  associated with the Tylox.   REVIEW OF SYSTEMS:  Review of systems positive for nausea and vomiting the  first 2 days of the pain, improved since then.  She had chills and sweats  all week, no fever.  Bowel movements are regular.   PHYSICAL EXAM:  GENERAL:  Physical exam reveals a healthy, large-framed  Caucasian female, alert and oriented x3.  HEENT:  Pupils equal, round and reactive.  Extraocular movements intact.  NECK:  Neck supple, trachea midline.  CHEST:  Chest clear to auscultation.  ABDOMEN:  Abdominal pain, right lower quadrant, 8 on a scale of 1-10.  PELVIC:  Bimanual exam shows tenderness more noted by the abdominal hand.  External genitalia shows vitiligo and vulvar dystrophy.  EXTREMITIES:  Extremities grossly normal.   IMPRESSION:  1. Right lower quadrant pain secondary to right ovarian cyst, doubt ovarian     torsion.  2. Vitiligo, stable.  3. Vulvar dystrophy, stable.   PLAN:  Laparoscopic evaluation of right tube and ovary.  Will remove cyst or  possibly entire right ovary.  Surgery is scheduled for June 06, 2004.     ___________________________________________  Tilda Burrow, M.D.   JVF/MEDQ  D:  06/05/2004  T:  06/06/2004  Job:  [id number]   cc:   [facility]

## 2011-05-18 ENCOUNTER — Emergency Department (HOSPITAL_COMMUNITY)
Admission: EM | Admit: 2011-05-18 | Discharge: 2011-05-18 | Disposition: A | Discharge status: Home / Self Care | Payer: BC Managed Care – PPO | Attending: Emergency Medicine | Admitting: Emergency Medicine

## 2011-05-18 DIAGNOSIS — F411 Generalized anxiety disorder: Secondary | ICD-10-CM | POA: Insufficient documentation

## 2011-05-18 DIAGNOSIS — L259 Unspecified contact dermatitis, unspecified cause: Secondary | ICD-10-CM | POA: Insufficient documentation

## 2011-05-18 DIAGNOSIS — J45909 Unspecified asthma, uncomplicated: Secondary | ICD-10-CM | POA: Insufficient documentation

## 2011-05-18 DIAGNOSIS — G43909 Migraine, unspecified, not intractable, without status migrainosus: Secondary | ICD-10-CM | POA: Insufficient documentation

## 2011-05-18 DIAGNOSIS — Z79899 Other long term (current) drug therapy: Secondary | ICD-10-CM | POA: Insufficient documentation

## 2011-08-14 ENCOUNTER — Emergency Department (HOSPITAL_COMMUNITY)
Admission: EM | Admit: 2011-08-14 | Discharge: 2011-08-14 | Disposition: A | Discharge status: Home / Self Care | Payer: BC Managed Care – PPO | Attending: Emergency Medicine | Admitting: Emergency Medicine

## 2011-08-14 ENCOUNTER — Emergency Department (HOSPITAL_COMMUNITY): Payer: BC Managed Care – PPO

## 2011-08-14 DIAGNOSIS — IMO0002 Reserved for concepts with insufficient information to code with codable children: Secondary | ICD-10-CM | POA: Insufficient documentation

## 2011-08-14 DIAGNOSIS — Y92009 Unspecified place in unspecified non-institutional (private) residence as the place of occurrence of the external cause: Secondary | ICD-10-CM | POA: Insufficient documentation

## 2011-08-14 DIAGNOSIS — S60229A Contusion of unspecified hand, initial encounter: Secondary | ICD-10-CM | POA: Insufficient documentation

## 2011-08-14 DIAGNOSIS — F17200 Nicotine dependence, unspecified, uncomplicated: Secondary | ICD-10-CM | POA: Insufficient documentation

## 2011-08-14 HISTORY — DX: Anxiety disorder, unspecified: F41.9

## 2011-08-14 HISTORY — DX: Major depressive disorder, single episode, unspecified: F32.9

## 2011-08-14 HISTORY — DX: Panic disorder (episodic paroxysmal anxiety): F41.0

## 2011-08-14 HISTORY — DX: Depression, unspecified: F32.A

## 2011-08-14 MED ORDER — IBUPROFEN 800 MG PO TABS: 800.0000 mg | ORAL_TABLET | Freq: Three times a day (TID) | ORAL | Status: AC

## 2011-08-14 MED ORDER — ONDANSETRON 4 MG PO TBDP
4.0000 mg | ORAL_TABLET | Freq: Once | ORAL | Status: AC
  Administered 2011-08-14: 4 mg via ORAL
  Filled 2011-08-14: qty 1

## 2011-08-14 MED ORDER — OXYCODONE-ACETAMINOPHEN 5-325 MG PO TABS
1.0000 | ORAL_TABLET | Freq: Once | ORAL | Status: AC
  Administered 2011-08-14: 1 via ORAL
  Filled 2011-08-14: qty 1
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
## 2011-08-16 ENCOUNTER — Encounter (HOSPITAL_COMMUNITY): Payer: Self-pay | Admitting: *Deleted

## 2011-08-16 DIAGNOSIS — J45909 Unspecified asthma, uncomplicated: Secondary | ICD-10-CM | POA: Insufficient documentation

## 2011-08-16 DIAGNOSIS — F17200 Nicotine dependence, unspecified, uncomplicated: Secondary | ICD-10-CM | POA: Insufficient documentation

## 2011-08-16 DIAGNOSIS — F410 Panic disorder [episodic paroxysmal anxiety] without agoraphobia: Secondary | ICD-10-CM | POA: Insufficient documentation

## 2011-08-16 DIAGNOSIS — F341 Dysthymic disorder: Secondary | ICD-10-CM | POA: Insufficient documentation

## 2011-08-16 DIAGNOSIS — R51 Headache: Secondary | ICD-10-CM | POA: Insufficient documentation

## 2011-08-16 NOTE — ED Notes (Signed)
Pt reports headache x 3 days unrelieved by imitrex injection

## 2011-08-17 ENCOUNTER — Emergency Department (HOSPITAL_COMMUNITY)
Admission: EM | Admit: 2011-08-17 | Discharge: 2011-08-17 | Disposition: A | Discharge status: Home / Self Care | Payer: BC Managed Care – PPO | Attending: Emergency Medicine | Admitting: Emergency Medicine

## 2011-08-17 DIAGNOSIS — R51 Headache: Secondary | ICD-10-CM

## 2011-08-17 MED ORDER — ONDANSETRON HCL 4 MG PO TABS
4.0000 mg | ORAL_TABLET | Freq: Once | ORAL | Status: AC
  Administered 2011-08-17: 4 mg via ORAL
  Filled 2011-08-17: qty 1

## 2011-08-17 MED ORDER — DIPHENHYDRAMINE HCL 50 MG/ML IJ SOLN
25.0000 mg | Freq: Once | INTRAMUSCULAR | Status: AC
  Administered 2011-08-17: 25 mg via INTRAMUSCULAR
  Filled 2011-08-17: qty 1

## 2011-08-17 MED ORDER — HYDROMORPHONE HCL 1 MG/ML IJ SOLN
1.0000 mg | Freq: Once | INTRAMUSCULAR | Status: AC
  Administered 2011-08-17: 1 mg via INTRAMUSCULAR
  Filled 2011-08-17: qty 1

## 2011-08-17 MED ORDER — KETOROLAC TROMETHAMINE 60 MG/2ML IM SOLN
60.0000 mg | Freq: Once | INTRAMUSCULAR | Status: AC
Start: 2011-08-17 — End: 2011-08-17
  Administered 2011-08-17: 60 mg via INTRAMUSCULAR
  Filled 2011-08-17: qty 2

## 2011-08-17 NOTE — ED Provider Notes (Signed)
History     CSN: [id number] Arrival date & time: 08/17/2011 12:22 AM  Chief Complaint  Patient presents with  . Headache   HPI Comments: Seen 0102.  Patient is a 30 y.o. female presenting with headaches. The history is provided by the patient.  Headache  This is a new problem. Episode onset: 2 days. Normally is resolved by imitrex injection. No relief with imitrex. The problem has not changed since onset.The pain is located in the right unilateral region. The quality of the pain is described as throbbing. The pain is at a severity of 7/10. The pain is moderate. The pain does not radiate. Associated symptoms include anorexia, nausea and vomiting. Pertinent negatives include no fever, no chest pressure, no near-syncope, no orthopnea, no palpitations and no shortness of breath. Treatments tried: imitrex. The treatment provided no relief.    Past Medical History  Diagnosis Date  . Asthma   . Anxiety   . Panic attack   . Depression   . Migraine     Past Surgical History  Procedure Date  . Cesarean section   . Ovarian cyst removal   . Appendectomy   . Cholecystectomy     No family history on file.  History  Substance Use Topics  . Smoking status: Current Everyday Smoker    Types: Cigarettes  . Smokeless tobacco: Not on file  . Alcohol Use: No    OB History    Grav Para Term Preterm Abortions TAB SAB Ect Mult Living                  Review of Systems  Constitutional: Negative for fever.  Respiratory: Negative for shortness of breath.   Cardiovascular: Negative for palpitations, orthopnea and near-syncope.  Gastrointestinal: Positive for nausea, vomiting and anorexia.  Neurological: Positive for headaches.  All other systems reviewed and are negative.    Physical Exam  BP 115/58  Pulse 60  Temp(Src) 98.3 F (36.8 C) (Oral)  Resp 20  Ht 5\' 6"  (1.676 m)  Wt 236 lb (107.049 kg)  BMI 38.09 kg/m2  SpO2 100%  LMP 08/14/2011  Physical Exam  Constitutional: She  is oriented to person, place, and time. She appears well-developed and well-nourished.  HENT:  Head: Normocephalic and atraumatic.  Right Ear: External ear normal.  Left Ear: External ear normal.  Nose: Nose normal.  Mouth/Throat: Oropharynx is clear and moist.  Eyes: EOM are normal. Pupils are equal, round, and reactive to light.  Neck: Normal range of motion. Neck supple.  Cardiovascular: Normal rate, normal heart sounds and intact distal pulses.   Pulmonary/Chest: Effort normal and breath sounds normal.  Abdominal: Soft. Bowel sounds are normal.  Musculoskeletal: Normal range of motion.  Neurological: She is alert and oriented to person, place, and time. She has normal reflexes. No cranial nerve deficit. Coordination normal.  Skin: Skin is warm and dry.  Psychiatric: She has a normal mood and affect. Her behavior is normal.    ED Course  Procedures  MDM Patient with h/o migraines presents with typical headache. Headache improved with treatment.Pt feels improved after observation and/or treatment in ED. MDM Reviewed: nursing note and vitals        Nicoletta Dress. Colon Branch, MD 08/17/11 0330

## 2011-09-22 LAB — RAPID URINE DRUG SCREEN, HOSP PERFORMED
Barbiturates: NOT DETECTED
Benzodiazepines: POSITIVE — AB

## 2011-09-22 LAB — DIFFERENTIAL
Basophils Absolute: 0.1
Basophils Relative: 1
Eosinophils Relative: 2
Lymphocytes Relative: 40

## 2011-09-22 LAB — BASIC METABOLIC PANEL
BUN: 6
CO2: 25
Calcium: 9
GFR calc non Af Amer: >60
Glucose, Bld: 98
Potassium: 3.6

## 2011-09-22 LAB — CBC
HCT: 36
MCHC: 35.6
Platelets: 319
RDW: 12.8

## 2011-09-22 LAB — PREGNANCY, URINE: Preg Test, Ur: NEGATIVE

## 2011-09-24 LAB — D-DIMER, QUANTITATIVE: D-Dimer, Quant: 0.31

## 2011-09-29 LAB — STREP A DNA PROBE: Group A Strep Probe: NEGATIVE

## 2011-10-07 LAB — URINE MICROSCOPIC-ADD ON

## 2011-10-07 LAB — URINALYSIS, ROUTINE W REFLEX MICROSCOPIC
Bilirubin Urine: NEGATIVE
Leukocytes, UA: NEGATIVE
Nitrite: POSITIVE — AB
Specific Gravity, Urine: <1.005 — ABNORMAL LOW
pH: 6

## 2011-10-10 LAB — URINALYSIS, ROUTINE W REFLEX MICROSCOPIC
Bilirubin Urine: NEGATIVE
Glucose, UA: NEGATIVE
Hgb urine dipstick: NEGATIVE
Ketones, ur: NEGATIVE
Protein, ur: NEGATIVE
Urobilinogen, UA: 0.2

## 2011-11-05 ENCOUNTER — Encounter (HOSPITAL_COMMUNITY): Payer: Self-pay | Admitting: *Deleted

## 2011-11-05 ENCOUNTER — Emergency Department (HOSPITAL_COMMUNITY)
Admission: EM | Admit: 2011-11-05 | Discharge: 2011-11-05 | Disposition: A | Discharge status: Home / Self Care | Payer: BC Managed Care – PPO | Attending: Emergency Medicine | Admitting: Emergency Medicine

## 2011-11-05 DIAGNOSIS — R51 Headache: Secondary | ICD-10-CM | POA: Insufficient documentation

## 2011-11-05 MED ORDER — DIPHENHYDRAMINE HCL 50 MG/ML IJ SOLN
12.5000 mg | Freq: Once | INTRAMUSCULAR | Status: AC
  Administered 2011-11-05: 12.5 mg via INTRAVENOUS
  Filled 2011-11-05: qty 1

## 2011-11-05 MED ORDER — VALPROATE SODIUM 500 MG/5ML IV SOLN
500.0000 mg | Freq: Once | INTRAVENOUS | Status: AC
  Administered 2011-11-05: 500 mg via INTRAVENOUS
  Filled 2011-11-05: qty 5

## 2011-11-05 MED ORDER — VALPROATE SODIUM 500 MG/5ML IV SOLN
INTRAVENOUS | Status: AC
  Filled 2011-11-05: qty 5

## 2011-11-05 MED ORDER — TRAMADOL HCL 50 MG PO TABS
50.0000 mg | ORAL_TABLET | Freq: Once | ORAL | Status: AC
  Administered 2011-11-05: 50 mg via ORAL
  Filled 2011-11-05: qty 1

## 2011-11-05 MED ORDER — SODIUM CHLORIDE 0.9 % IV BOLUS (SEPSIS)
500.0000 mL | Freq: Once | INTRAVENOUS | Status: AC
  Administered 2011-11-05: 500 mL via INTRAVENOUS

## 2011-11-05 MED ORDER — SUMATRIPTAN SUCCINATE 6 MG/0.5ML ~~LOC~~ SOLN
6.0000 mg | Freq: Once | SUBCUTANEOUS | Status: AC
  Administered 2011-11-05: 6 mg via SUBCUTANEOUS
  Filled 2011-11-05: qty 0.5

## 2011-11-05 MED ORDER — METOCLOPRAMIDE HCL 5 MG/ML IJ SOLN
10.0000 mg | Freq: Once | INTRAMUSCULAR | Status: AC
  Administered 2011-11-05: 10 mg via INTRAVENOUS
  Filled 2011-11-05: qty 2

## 2011-11-05 MED ORDER — METHOCARBAMOL 500 MG PO TABS
500.0000 mg | ORAL_TABLET | Freq: Once | ORAL | Status: AC
  Administered 2011-11-05: 500 mg via ORAL
  Filled 2011-11-05: qty 1

## 2011-11-05 MED ORDER — KETOROLAC TROMETHAMINE 30 MG/ML IJ SOLN
30.0000 mg | Freq: Once | INTRAMUSCULAR | Status: AC
  Administered 2011-11-05: 30 mg via INTRAVENOUS
  Filled 2011-11-05: qty 1

## 2011-11-05 NOTE — ED Notes (Signed)
Rivka Spring, RN at bedsdie attempting IV access at this time. Into room to assess patient. Sates she has a history of migraines and has had a migraine for the past 4 days. States it is to the right side of head. Nothing makes pain better and "everything" makes it worse. No visual disturbances. Patient keeping arm over eye. Is alert and oriented x 4.

## 2011-11-05 NOTE — ED Notes (Addendum)
Into room to see patient. Resting in bed with eyes closed. States pain remains 10\10. Given warm blanket per request. Denies any other needs. Call bell within reach. Will continue to monitor. MD mae aware

## 2011-11-05 NOTE — ED Notes (Signed)
Patients pain 4\10 at this time. No distress. Equal chest rise and fall. Call bell within reach. depakote infusion complete; iv saline locked with no signs of infiltration.

## 2011-11-05 NOTE — ED Notes (Signed)
Patient remains having 10/10 headache. Denies any needs. Call bell within reach. MD is aware.

## 2011-11-05 NOTE — ED Notes (Signed)
Patient ambulatory to bathroom to obtain urine specimen via clean catch. 

## 2011-11-05 NOTE — ED Notes (Signed)
Medicated per md order for 7/10 headache. Denies any needs at this time. No distress. Equal chest rise and fall. A&O x 4. Call bell within reach. Will continue to monitor. ERROR IN MAR: Pain 7/10 not 10/10.

## 2011-11-05 NOTE — ED Notes (Signed)
Patient medicated per md order for 10/10 pain. Denies any needs. Normal saline continues to infuse well with no signs of infiltration. Call bell within reach. Denies any needs. Will continue to monitor.

## 2011-11-05 NOTE — ED Notes (Signed)
Patient ambulatory to bathroom. Steady pain. Pain 10\10. Denies dizziness or light headedness.

## 2011-11-05 NOTE — ED Notes (Signed)
MD at bedside. 

## 2011-11-05 NOTE — ED Notes (Signed)
Headache for 4 days , neck hurts, "feels stiff", vomiting  No fever  No injury

## 2011-11-05 NOTE — ED Notes (Signed)
Pain 4/10 at this time.

## 2011-11-05 NOTE — ED Provider Notes (Signed)
History     CSN: [id number] Arrival date & time: 11/05/2011 12:58 AM   First MD Initiated Contact with Patient 11/05/11 0118      Chief Complaint  Patient presents with  . Headache    (Consider location/radiation/quality/duration/timing/severity/associated sxs/prior treatment) Patient is a 30 y.o. female presenting with headaches. The history is provided by the patient. No language interpreter was used.  Headache  This is a recurrent problem. The current episode started more than 2 days ago (4 days ago). The problem occurs constantly. The problem has not changed since onset.The headache is associated with nothing. The pain is located in the right unilateral region. The quality of the pain is described as throbbing. The pain is at a severity of 8/10. The pain is severe. The pain does not radiate. Pertinent negatives include no anorexia, no fever, no malaise/fatigue, no chest pressure, no near-syncope, no orthopnea, no palpitations, no syncope, no shortness of breath, no nausea and no vomiting. She has tried nothing for the symptoms. The treatment provided no relief.  No f/c/r.  No neck stiffness, no phot nor phonophobia.  This is a typical migraine for her and there ARE NO ATYPICAL FEATURES.  No sick contacts, no antecedent illness.  No rashes on the skin.  The headache was not sudden onset, not the worst HA of her life.  No changes in cognition, no weakness no numbness no focal neurologic deficits.    Past Medical History  Diagnosis Date  . Asthma   . Anxiety   . Panic attack   . Depression   . Migraine     Past Surgical History  Procedure Date  . Cesarean section   . Ovarian cyst removal   . Appendectomy   . Cholecystectomy     History reviewed. No pertinent family history.  History  Substance Use Topics  . Smoking status: Current Everyday Smoker    Types: Cigarettes  . Smokeless tobacco: Not on file  . Alcohol Use: No    OB History    Grav Para Term Preterm Abortions  TAB SAB Ect Mult Living                  Review of Systems  Constitutional: Negative for fever, chills, malaise/fatigue and fatigue.  HENT: Negative for hearing loss, facial swelling, neck stiffness and tinnitus.   Eyes: Negative for photophobia, discharge, redness and itching.  Respiratory: Negative for shortness of breath.   Cardiovascular: Negative for chest pain, palpitations, orthopnea, syncope and near-syncope.  Gastrointestinal: Negative.  Negative for nausea, vomiting and anorexia.  Genitourinary: Negative for difficulty urinating.  Musculoskeletal: Negative.   Neurological: Positive for headaches. Negative for seizures, syncope, speech difficulty, weakness, light-headedness and numbness.  Hematological: Negative.   Psychiatric/Behavioral: Negative.     Allergies  Review of patient's allergies indicates no known allergies.  Home Medications   Current Outpatient Rx  Name Route Sig Dispense Refill  . IMITREX IJ Injection Inject as directed.      Marland Kitchen ALPRAZOLAM 1 MG PO TABS Oral Take 1 mg by mouth at bedtime as needed.      Marland Kitchen CITALOPRAM HYDROBROMIDE 20 MG PO TABS Oral Take 20 mg by mouth daily.        BP 119/73  Pulse 86  Temp(Src) 97.5 F (36.4 C) (Oral)  Resp 20  Ht 5\' 6"  (1.676 m)  Wt 232 lb (105.235 kg)  BMI 37.45 kg/m2  SpO2 100%  LMP 10/05/2011  Physical Exam  Constitutional: She is oriented to  person, place, and time. She appears well-developed and well-nourished. No distress.  HENT:  Head: Normocephalic and atraumatic.  Left Ear: External ear normal.  Nose: Nose normal.  Mouth/Throat: Oropharyngeal exudate present.  Eyes: Conjunctivae and EOM are normal. Pupils are equal, round, and reactive to light. Right eye exhibits no discharge. Left eye exhibits no discharge.  Neck: Normal range of motion. Neck supple.       No meningeal signs, no Kernigs no Bruzinskis  Cardiovascular: Normal rate and regular rhythm.   No murmur heard. Pulmonary/Chest: Effort  normal and breath sounds normal. No respiratory distress. She has no wheezes.  Abdominal: Soft. Bowel sounds are normal.  Musculoskeletal: Normal range of motion. She exhibits no edema and no tenderness.  Lymphadenopathy:    She has no cervical adenopathy.  Neurological: She is alert and oriented to person, place, and time. She displays normal reflexes. No cranial nerve deficit.  Skin: Skin is warm and dry.  Psychiatric: She has a normal mood and affect.    ED Course  Procedures (including critical care time)   Labs Reviewed  POCT PREGNANCY, URINE  POCT PREGNANCY, URINE   No results found.   No diagnosis found.    MDM  Reexamined patient: patient sleeping in room, awoke patient and she states pain is about 4 points better.  EOMI, no focal neuro deficits, neck supple.  Will give imitrex reassess ans plan discharge  Pain ,arled;y improved: return for fever, stiff neck rashes on the skin or worsening pain, weakness numbness changes in cognition changes in vision or any concerns.  Patient verbalizes understanding and agrees to follow up      Destanee Bedonie Smitty Cords, MD 11/05/11 [phone]

## 2011-12-30 ENCOUNTER — Emergency Department (HOSPITAL_COMMUNITY)
Admission: EM | Admit: 2011-12-30 | Discharge: 2011-12-30 | Disposition: A | Discharge status: Home / Self Care | Payer: BC Managed Care – PPO | Attending: Emergency Medicine | Admitting: Emergency Medicine

## 2011-12-30 ENCOUNTER — Emergency Department (HOSPITAL_COMMUNITY): Payer: BC Managed Care – PPO

## 2011-12-30 ENCOUNTER — Encounter (HOSPITAL_COMMUNITY): Payer: Self-pay | Admitting: *Deleted

## 2011-12-30 DIAGNOSIS — M542 Cervicalgia: Secondary | ICD-10-CM | POA: Insufficient documentation

## 2011-12-30 DIAGNOSIS — F17200 Nicotine dependence, unspecified, uncomplicated: Secondary | ICD-10-CM | POA: Insufficient documentation

## 2011-12-30 DIAGNOSIS — W19XXXA Unspecified fall, initial encounter: Secondary | ICD-10-CM

## 2011-12-30 DIAGNOSIS — M545 Low back pain, unspecified: Secondary | ICD-10-CM | POA: Insufficient documentation

## 2011-12-30 DIAGNOSIS — F411 Generalized anxiety disorder: Secondary | ICD-10-CM | POA: Insufficient documentation

## 2011-12-30 DIAGNOSIS — F3289 Other specified depressive episodes: Secondary | ICD-10-CM | POA: Insufficient documentation

## 2011-12-30 DIAGNOSIS — T07XXXA Unspecified multiple injuries, initial encounter: Secondary | ICD-10-CM | POA: Insufficient documentation

## 2011-12-30 DIAGNOSIS — Y9352 Activity, horseback riding: Secondary | ICD-10-CM

## 2011-12-30 DIAGNOSIS — G43909 Migraine, unspecified, not intractable, without status migrainosus: Secondary | ICD-10-CM | POA: Insufficient documentation

## 2011-12-30 DIAGNOSIS — J45909 Unspecified asthma, uncomplicated: Secondary | ICD-10-CM | POA: Insufficient documentation

## 2011-12-30 DIAGNOSIS — R079 Chest pain, unspecified: Secondary | ICD-10-CM | POA: Insufficient documentation

## 2011-12-30 DIAGNOSIS — F329 Major depressive disorder, single episode, unspecified: Secondary | ICD-10-CM | POA: Insufficient documentation

## 2011-12-30 DIAGNOSIS — M549 Dorsalgia, unspecified: Secondary | ICD-10-CM

## 2011-12-30 DIAGNOSIS — R109 Unspecified abdominal pain: Secondary | ICD-10-CM | POA: Insufficient documentation

## 2011-12-30 LAB — POCT I-STAT, CHEM 8
BUN: 6 mg/dL (ref 6–23)
Calcium, Ion: 1.16 mmol/L (ref 1.12–1.32)
Chloride: 105 mEq/L (ref 96–112)
Creatinine, Ser: 0.6 mg/dL (ref 0.50–1.10)
Glucose, Bld: 99 mg/dL (ref 70–99)
HCT: 38 % (ref 36.0–46.0)
Hemoglobin: 12.9 g/dL (ref 12.0–15.0)
Potassium: 3.6 mEq/L (ref 3.5–5.1)
Sodium: 141 mEq/L (ref 135–145)
TCO2: 24 mmol/L (ref 0–100)

## 2011-12-30 MED ORDER — DIAZEPAM 5 MG/ML IJ SOLN
5.0000 mg | Freq: Once | INTRAMUSCULAR | Status: AC
  Administered 2011-12-30: 5 mg via INTRAVENOUS

## 2011-12-30 MED ORDER — HYDROMORPHONE HCL PF 1 MG/ML IJ SOLN
1.0000 mg | Freq: Once | INTRAMUSCULAR | Status: AC
  Administered 2011-12-30: 1 mg via INTRAVENOUS

## 2011-12-30 MED ORDER — SODIUM CHLORIDE 0.9 % IV BOLUS (SEPSIS)
1000.0000 mL | Freq: Once | INTRAVENOUS | Status: AC
  Administered 2011-12-30: 1000 mL via INTRAVENOUS

## 2011-12-30 MED ORDER — KETOROLAC TROMETHAMINE 30 MG/ML IJ SOLN
15.0000 mg | Freq: Once | INTRAMUSCULAR | Status: AC
  Administered 2011-12-30: 19:00:00 via INTRAVENOUS

## 2011-12-30 MED ORDER — KETOROLAC TROMETHAMINE 30 MG/ML IJ SOLN
INTRAMUSCULAR | Status: AC
  Filled 2011-12-30: qty 1

## 2011-12-30 MED ORDER — HYDROMORPHONE HCL PF 1 MG/ML IJ SOLN
1.0000 mg | Freq: Once | INTRAMUSCULAR | Status: AC
  Administered 2011-12-30: 1 mg via INTRAVENOUS
  Filled 2011-12-30: qty 1

## 2011-12-30 MED ORDER — DIAZEPAM 5 MG/ML IJ SOLN
INTRAMUSCULAR | Status: AC
  Filled 2011-12-30: qty 2

## 2011-12-30 MED ORDER — ONDANSETRON HCL 4 MG/2ML IJ SOLN
4.0000 mg | Freq: Once | INTRAMUSCULAR | Status: AC
  Administered 2011-12-30: 4 mg via INTRAVENOUS
  Filled 2011-12-30: qty 2

## 2011-12-30 MED ORDER — IOHEXOL 300 MG/ML  SOLN
100.0000 mL | Freq: Once | INTRAMUSCULAR | Status: AC | PRN
  Administered 2011-12-30: 100 mL via INTRAVENOUS

## 2011-12-30 MED ORDER — HYDROMORPHONE HCL PF 1 MG/ML IJ SOLN
INTRAMUSCULAR | Status: AC
  Filled 2011-12-30: qty 1

## 2011-12-30 MED ORDER — NAPROXEN 500 MG PO TABS: 500.0000 mg | ORAL_TABLET | Freq: Two times a day (BID) | ORAL | Status: DC | PRN

## 2011-12-30 MED ORDER — OXYCODONE-ACETAMINOPHEN 5-325 MG PO TABS: 8.0000 | ORAL_TABLET | ORAL | Status: AC | PRN

## 2011-12-30 NOTE — ED Notes (Signed)
Pt states that she was thrown from horse today, fell against a fence. Pt c/o pain to neck, lower back, abd area, denies any loc.

## 2011-12-30 NOTE — ED Notes (Signed)
Pt still c/o pain at a 10, edp notified, additional orders given

## 2011-12-30 NOTE — ED Provider Notes (Cosign Needed)
History    30yF s/p fall from horse. Happened shortly before arrival. Pt thrown into side of fence on L side. Doesn't think hit head. No LOC. No HA. C/o neck, abdominal, lower back and L chest pain. Mild nausea. No vomiting. No visual changes. No numbness, weakness or tingling. Denies use of blood thinning medications.  CSN: [id number]  Arrival date & time 12/30/11  1546   First MD Initiated Contact with Patient 12/30/11 1627      Chief Complaint  Patient presents with  . Fall    (Consider location/radiation/quality/duration/timing/severity/associated sxs/prior treatment) HPI  Past Medical History  Diagnosis Date  . Asthma   . Anxiety   . Panic attack   . Depression   . Migraine     Past Surgical History  Procedure Date  . Cesarean section   . Ovarian cyst removal   . Appendectomy   . Cholecystectomy     No family history on file.  History  Substance Use Topics  . Smoking status: Current Everyday Smoker    Types: Cigarettes  . Smokeless tobacco: Not on file  . Alcohol Use: No    OB History    Grav Para Term Preterm Abortions TAB SAB Ect Mult Living                  Review of Systems   Review of symptoms negative unless otherwise noted in HPI.  Allergies  Coconut flavor  Home Medications   Current Outpatient Rx  Name Route Sig Dispense Refill  . CITALOPRAM HYDROBROMIDE 20 MG PO TABS Oral Take 20 mg by mouth daily.      . ALBUTEROL SULFATE HFA 108 (90 BASE) MCG/ACT IN AERS Inhalation Inhale 2 puffs into the lungs every 6 (six) hours as needed. For Shortness of Breath     . ALPRAZOLAM 1 MG PO TABS Oral Take 1 mg by mouth 2 (two) times daily as needed. For anxiety    . ETONOGESTREL 68 MG [place] IMPL Subcutaneous Inject 1 each into the skin once.      Willette Brace IJ Injection Inject as directed.        BP 132/67  Pulse 91  Resp 20  SpO2 100%  Physical Exam  Nursing note and vitals reviewed. Constitutional: She appears well-developed and well-nourished.  No distress.  HENT:  Head: Normocephalic and atraumatic.  Right Ear: External ear normal.  Left Ear: External ear normal.  Eyes: Conjunctivae are normal. Right eye exhibits no discharge. Left eye exhibits no discharge.  Neck: Neck supple.  Cardiovascular: Normal rate, regular rhythm and normal heart sounds.  Exam reveals no gallop and no friction rub.   No murmur heard. Pulmonary/Chest: Effort normal and breath sounds normal. No respiratory distress.  Abdominal: Soft. She exhibits no distension.       Mild diffuse tenderness. No distension. No rebound or guarding.  Musculoskeletal: She exhibits no edema and no tenderness.       Mild midline tenderness lower c spine and lumbar area. No step-off. No overlying skin changes. Mild tenderness L chest wall. No crepitus. Pelvis stable. MOderate pain with ROM R hip. Neurovascularly intact distally.  Neurological: She is alert. She exhibits normal muscle tone.  Skin: Skin is warm and dry.  Psychiatric: She has a normal mood and affect. Her behavior is normal. Thought content normal.    ED Course  Procedures (including critical care time)   Labs Reviewed  POCT I-STAT, CHEM 8  I-STAT, CHEM 8  PREGNANCY, URINE  Dg Ribs Unilateral W/chest Left  12/30/2011  *RADIOLOGY REPORT*  Clinical Data: Fall from horse  LEFT RIBS AND CHEST - 3+ VIEW  Comparison: 01/22/2009  Findings: Four views left ribs submitted.  Cardiomediastinal silhouette is stable.  No acute infiltrate or pulmonary edema.  No left rib fracture is identified.  No diagnostic pneumothorax. Contrast material from CT scan noted in the left renal collecting system.  IMPRESSION: No left rib fracture is identified.  No diagnostic pneumothorax.  Original Report Authenticated By: Natasha Mead, M.D.   Dg Lumbar Spine Complete  12/30/2011  *RADIOLOGY REPORT*  Clinical Data: Pain post fall  LUMBAR SPINE - COMPLETE 4+ VIEW  Comparison: CT scan of the abdomen same day  Findings: Five views of the lumbar  spine submitted.  No acute fracture or subluxation.  There is moderate disc space flattening at L5 S1 level.  Mild anterior spurring lower endplate of L5. Post cholecystectomy surgical clips are noted.  IMPRESSION: No acute fracture or subluxation.  Moderate disc space flattening at L5 S1 level.  Original Report Authenticated By: Natasha Mead, M.D.   Dg Hip Complete Right  12/30/2011  *RADIOLOGY REPORT*  Clinical Data: Fall from horse  RIGHT HIP - COMPLETE 2+ VIEW  Comparison: CT scan pelvis same day  Findings: Contrast material from recent CT scan noted within urinary bladder.  The three views of the right hip submitted.  No right hip fracture or subluxation.  IMPRESSION: No acute fracture or subluxation.  Original Report Authenticated By: Natasha Mead, M.D.   Ct Cervical Spine Wo Contrast  12/30/2011  *RADIOLOGY REPORT*  Clinical Data: Fall from horse.  Pain.  CT CERVICAL SPINE WITHOUT CONTRAST  Technique:  Multidetector CT imaging of the cervical spine was performed. Multiplanar CT image reconstructions were also generated.  Comparison: 04/10/2011  Findings: Spinal visualization through the top of T1.  From the mid C6 level inferiorly is mildly degraded by overlying soft tissues. Prevertebral soft tissues are within normal limits.  No apical pneumothorax.  Skull base intact.  Developmental lucency in the anterior arch of C1, no change.  No acute fracture or subluxation. Facets are well-aligned.  Coronal reformats demonstrate a normal C1-C2 articulation.  IMPRESSION: No acute findings within the cervical spine.  Original Report Authenticated By: Consuello Bossier, M.D.   Ct Abdomen Pelvis W Contrast  12/30/2011  *RADIOLOGY REPORT*  Clinical Data: Fall from horse.  Left flank pain.  Stomach and back pain.  CT ABDOMEN AND PELVIS WITH CONTRAST  Technique:  Multidetector CT imaging of the abdomen and pelvis was performed following the standard protocol during bolus administration of intravenous contrast.  Contrast:  OMNIPAQUE IOHEXOL 300 MG/ML IV SOLN  Comparison: 04/10/2011  Findings: Clear lung bases.  No basilar pneumothorax.  Heart size upper normal, without pericardial or pleural effusion.  Normal liver, spleen, stomach, pancreas. Cholecystectomy without biliary ductal dilatation.  Normal adrenal glands and kidneys. No retroperitoneal or retrocrural adenopathy.  Normal colon and terminal ileum.  Appendectomy.  Normal small bowel.  No pneumatosis, free intraperitoneal air or fluid/hemorrhage.  No pelvic adenopathy.  Normal urinary bladder and uterus.  Dominant follicle within the right ovary. No significant free fluid.  Mild irregularity of the bilateral sacroiliac joints is similar to on the prior and likely degenerative.  Age advanced lumbar spondylosis.  IMPRESSION:  1.  No acute or post-traumatic deformity within the abdomen/pelvis. 2.  Similar irregularity of the bilateral sacroiliac joints. Favored to be degenerative.  Correlate with other causes of sacroiliitis.  Original Report Authenticated By: Consuello Bossier, M.D.     1. Activities involving horseback riding   2. Fall   3. Contusion, multiple sites   4. Acute back pain       MDM  30yf s/p fall from horse. Pain in multiple area. Consider fx, contusion sprain. Imaging negative for serious acute traumatic injury. Neuro exam nonfocal. Plan prn pain meds and fu as needed. Strict return precautions discussed.        Raeford Razor, MD 12/30/11 Barry Brunner

## 2011-12-30 NOTE — ED Notes (Signed)
Pt returned from xray, requesting something for pain, edp notified, additional orders given

## 2011-12-30 NOTE — ED Notes (Signed)
Pt remains in xray.

## 2011-12-30 NOTE — ED Notes (Signed)
Reports was bucked and thrown from her horse today at approx 1330; denies LOC; reports lower back pain, neck pain, and LUQ abd pain; pt is in [employer] C-collar from triage; alert, oriented x 4; answers questions appropriately.

## 2011-12-30 NOTE — ED Notes (Signed)
Pt states that her pian has not changed any from the pain medication, EDP notified,additional orders given

## 2011-12-30 NOTE — ED Notes (Signed)
c-collar applied at triage. Cms remains intact

## 2011-12-30 NOTE — ED Notes (Signed)
Pt given pre pack of percocets,

## 2012-01-05 MED FILL — Oxycodone w/ Acetaminophen Tab 5-325 MG: ORAL | Qty: 6 | Status: AC

## 2012-03-04 ENCOUNTER — Encounter (HOSPITAL_COMMUNITY): Payer: Self-pay | Admitting: *Deleted

## 2012-03-04 ENCOUNTER — Emergency Department (HOSPITAL_COMMUNITY)
Admission: EM | Admit: 2012-03-04 | Discharge: 2012-03-05 | Disposition: A | Discharge status: Home / Self Care | Payer: BC Managed Care – PPO | Attending: Emergency Medicine | Admitting: Emergency Medicine

## 2012-03-04 DIAGNOSIS — R51 Headache: Secondary | ICD-10-CM

## 2012-03-04 DIAGNOSIS — R1110 Vomiting, unspecified: Secondary | ICD-10-CM | POA: Insufficient documentation

## 2012-03-04 DIAGNOSIS — G43909 Migraine, unspecified, not intractable, without status migrainosus: Secondary | ICD-10-CM | POA: Insufficient documentation

## 2012-03-04 DIAGNOSIS — J45909 Unspecified asthma, uncomplicated: Secondary | ICD-10-CM | POA: Insufficient documentation

## 2012-03-04 MED ORDER — KETOROLAC TROMETHAMINE 30 MG/ML IJ SOLN
30.0000 mg | Freq: Once | INTRAMUSCULAR | Status: AC
  Administered 2012-03-04: 30 mg via INTRAVENOUS
  Filled 2012-03-04: qty 1

## 2012-03-04 MED ORDER — HYDROMORPHONE HCL PF 2 MG/ML IJ SOLN
2.0000 mg | Freq: Once | INTRAMUSCULAR | Status: AC
  Administered 2012-03-04: 2 mg via INTRAVENOUS
  Filled 2012-03-04: qty 1

## 2012-03-04 MED ORDER — SODIUM CHLORIDE 0.9 % IV SOLN
Freq: Once | INTRAVENOUS | Status: AC
  Administered 2012-03-04: 20 mL/h via INTRAVENOUS

## 2012-03-04 MED ORDER — PROMETHAZINE HCL 25 MG PO TABS: 25.0000 mg | ORAL_TABLET | Freq: Four times a day (QID) | ORAL | Status: DC | PRN

## 2012-03-04 MED ORDER — OXYCODONE-ACETAMINOPHEN 5-325 MG PO TABS: 1.0000 | ORAL_TABLET | Freq: Four times a day (QID) | ORAL | Status: AC | PRN

## 2012-03-04 MED ORDER — OXYCODONE-ACETAMINOPHEN 5-325 MG PO TABS: 1.0000 | ORAL_TABLET | Freq: Four times a day (QID) | ORAL | Status: DC | PRN

## 2012-03-04 MED ORDER — METOCLOPRAMIDE HCL 5 MG/ML IJ SOLN
10.0000 mg | Freq: Once | INTRAMUSCULAR | Status: AC
  Administered 2012-03-04: 10 mg via INTRAVENOUS
  Filled 2012-03-04: qty 2

## 2012-03-04 NOTE — ED Notes (Signed)
Migraine HA with N/V since yesterday morning per pt., hx of migraines

## 2012-03-04 NOTE — ED Provider Notes (Signed)
History     CSN: [id number]  Arrival date & time 03/04/12  2112   First MD Initiated Contact with Patient 03/04/12 2132      Chief Complaint  Patient presents with  . Migraine  . Emesis    (Consider location/radiation/quality/duration/timing/severity/associated sxs/prior treatment) Patient is a 31 y.o. female presenting with migraine and vomiting. The history is provided by the patient (pt complains of a headache). No language interpreter was used.  Migraine This is a new problem. The current episode started 12 to 24 hours ago. The problem occurs constantly. The problem has not changed since onset.Associated symptoms include headaches. Pertinent negatives include no chest pain and no abdominal pain. The symptoms are aggravated by nothing. The symptoms are relieved by nothing. She has tried nothing for the symptoms. The treatment provided moderate relief.  Emesis  Associated symptoms include headaches. Pertinent negatives include no abdominal pain, no cough and no diarrhea.    Past Medical History  Diagnosis Date  . Asthma   . Anxiety   . Panic attack   . Depression   . Migraine     Past Surgical History  Procedure Date  . Cesarean section   . Ovarian cyst removal   . Appendectomy   . Cholecystectomy     History reviewed. No pertinent family history.  History  Substance Use Topics  . Smoking status: Current Everyday Smoker    Types: Cigarettes  . Smokeless tobacco: Not on file  . Alcohol Use: No    OB History    Grav Para Term Preterm Abortions TAB SAB Ect Mult Living                  Review of Systems  Constitutional: Negative for fatigue.  HENT: Negative for congestion, sinus pressure and ear discharge.   Eyes: Negative for discharge.  Respiratory: Negative for cough.   Cardiovascular: Negative for chest pain.  Gastrointestinal: Positive for vomiting. Negative for abdominal pain and diarrhea.  Genitourinary: Negative for frequency and hematuria.    Musculoskeletal: Negative for back pain.  Skin: Negative for rash.  Neurological: Positive for headaches. Negative for seizures.  Hematological: Negative.   Psychiatric/Behavioral: Negative for hallucinations.    Allergies  Coconut flavor  Home Medications   Current Outpatient Rx  Name Route Sig Dispense Refill  . IMITREX IJ Injection Inject as directed.      . ALBUTEROL SULFATE HFA 108 (90 BASE) MCG/ACT IN AERS Inhalation Inhale 2 puffs into the lungs every 6 (six) hours as needed. For Shortness of Breath     . ETONOGESTREL 68 MG [place] IMPL Subcutaneous Inject 1 each into the skin once.        BP 133/74  Pulse 85  Temp(Src) 98.6 F (37 C) (Oral)  Resp 14  Ht 5\' 6"  (1.676 m)  Wt 231 lb (104.781 kg)  BMI 37.28 kg/m2  SpO2 100%  Physical Exam  Constitutional: She is oriented to person, place, and time. She appears well-developed.  HENT:  Head: Normocephalic and atraumatic.  Eyes: Conjunctivae and EOM are normal. No scleral icterus.  Neck: Neck supple. No thyromegaly present.  Cardiovascular: Normal rate and regular rhythm.  Exam reveals no gallop and no friction rub.   No murmur heard. Pulmonary/Chest: No stridor. She has no wheezes. She has no rales. She exhibits no tenderness.  Abdominal: She exhibits no distension. There is no tenderness. There is no rebound.  Musculoskeletal: Normal range of motion. She exhibits no edema.  Lymphadenopathy:  She has no cervical adenopathy.  Neurological: She is oriented to person, place, and time. Coordination normal.  Skin: No rash noted. No erythema.  Psychiatric: She has a normal mood and affect. Her behavior is normal.    ED Course  Procedures (including critical care time)  Labs Reviewed - No data to display No results found.   No diagnosis found.    MDM  The chart was scribed for me under my direct supervision.  I personally performed the history, physical, and medical decision making and all procedures in the  evaluation of this patient.Benny Lennert, MD 03/04/12 2329

## 2012-03-04 NOTE — Discharge Instructions (Signed)
Follow up with your md as needed °

## 2012-03-05 MED ORDER — OXYCODONE-ACETAMINOPHEN 5-325 MG PO TABS: 1.0000 | ORAL_TABLET | Freq: Four times a day (QID) | ORAL | Status: DC | PRN

## 2012-03-09 ENCOUNTER — Encounter (HOSPITAL_COMMUNITY): Payer: Self-pay | Admitting: Emergency Medicine

## 2012-03-09 ENCOUNTER — Emergency Department (HOSPITAL_COMMUNITY)
Admission: EM | Admit: 2012-03-09 | Discharge: 2012-03-09 | Disposition: A | Discharge status: Home / Self Care | Payer: BC Managed Care – PPO | Attending: Emergency Medicine | Admitting: Emergency Medicine

## 2012-03-09 DIAGNOSIS — G43909 Migraine, unspecified, not intractable, without status migrainosus: Secondary | ICD-10-CM

## 2012-03-09 MED ORDER — OXYCODONE-ACETAMINOPHEN 5-325 MG PO TABS
1.0000 | ORAL_TABLET | Freq: Once | ORAL | Status: AC
  Administered 2012-03-09: 1 via ORAL
  Filled 2012-03-09: qty 1

## 2012-03-09 MED ORDER — KETOROLAC TROMETHAMINE 60 MG/2ML IM SOLN
60.0000 mg | Freq: Once | INTRAMUSCULAR | Status: AC
  Administered 2012-03-09: 60 mg via INTRAMUSCULAR
  Filled 2012-03-09: qty 2

## 2012-03-09 MED ORDER — DEXAMETHASONE SODIUM PHOSPHATE 4 MG/ML IJ SOLN
10.0000 mg | Freq: Once | INTRAMUSCULAR | Status: AC
  Administered 2012-03-09: 10 mg via INTRAMUSCULAR
  Filled 2012-03-09: qty 3

## 2012-03-09 MED ORDER — METOCLOPRAMIDE HCL 5 MG/ML IJ SOLN
10.0000 mg | Freq: Once | INTRAMUSCULAR | Status: AC
  Administered 2012-03-09: 10 mg via INTRAMUSCULAR
  Filled 2012-03-09: qty 2

## 2012-03-09 MED FILL — Oxycodone w/ Acetaminophen Tab 5-325 MG: ORAL | Qty: 6 | Status: AC

## 2012-03-09 NOTE — ED Provider Notes (Signed)
Pt well appearing, ambulatory, gait normal, no focal neuro deficits Reports she woke up with this HA Doubt SAH or other acute neurologic process at this time BP 133/74  Pulse 82  Temp(Src) 98.7 F (37.1 C) (Oral)  Resp 20  Ht 5\' 5"  (1.651 m)  Wt 232 lb (105.235 kg)  BMI 38.61 kg/m2  SpO2 98%   Joya Gaskins, MD 03/09/12 2255

## 2012-03-09 NOTE — ED Provider Notes (Signed)
History     CSN: [id number]  Arrival date & time 03/09/12  2033   First MD Initiated Contact with Patient 03/09/12 2056      Chief Complaint  Patient presents with  . Headache    (Consider location/radiation/quality/duration/timing/severity/associated sxs/prior treatment) HPI Comments: Patient who was seen here on 03/04/2012 for migraine headache returns to the emergency department this evening with the complaint of continued headache.  She states that she only got approximately one hour of relief from the medication she received on her previous visit.  Chelsea stay she was seen by her primary care physician earlier today and received 2 injections without any improvement of pain. She states that she has an appointment with a neurologist for next week. She states that the pain is similar to previous headaches except the pain is lasting longer. She also reports nausea and vomiting intermittently. She denies any chest pain, dyspnea, numbness, or weakness.  Patient is a 31 y.o. female presenting with headaches. The history is provided by the patient. No language interpreter was used.  Headache  This is a recurrent problem. The current episode started more than 2 days ago. The problem occurs constantly. The problem has not changed since onset.The headache is associated with bright light. The pain is located in the bilateral, frontal and temporal region. The quality of the pain is described as throbbing. The pain is at a severity of 9/10. The pain does not radiate. Associated symptoms include nausea and vomiting. Pertinent negatives include no fever, no chest pressure, no near-syncope, no palpitations, no syncope and no shortness of breath. She has tried oral narcotic analgesics and NSAIDs for the symptoms. The treatment provided no relief.    Past Medical History  Diagnosis Date  . Asthma   . Anxiety   . Panic attack   . Depression   . Migraine     Past Surgical History  Procedure Date  .  Cesarean section   . Ovarian cyst removal   . Appendectomy   . Cholecystectomy     History reviewed. No pertinent family history.  History  Substance Use Topics  . Smoking status: Current Everyday Smoker -- 0.5 packs/day    Types: Cigarettes  . Smokeless tobacco: Not on file  . Alcohol Use: No    OB History    Grav Para Term Preterm Abortions TAB SAB Ect Mult Living                  Review of Systems  Constitutional: Negative for fever.  Respiratory: Negative for shortness of breath.   Cardiovascular: Negative for palpitations, syncope and near-syncope.  Gastrointestinal: Positive for nausea and vomiting.  Neurological: Positive for headaches.    Allergies  Coconut flavor  Home Medications   Current Outpatient Rx  Name Route Sig Dispense Refill  . ALBUTEROL SULFATE HFA 108 (90 BASE) MCG/ACT IN AERS Inhalation Inhale 2 puffs into the lungs every 6 (six) hours as needed. For Shortness of Breath     . TORADOL IJ Injection Inject as directed once.    . OXYCODONE-ACETAMINOPHEN 5-325 MG PO TABS Oral Take 1 tablet by mouth every 6 (six) hours as needed for pain. 6 tablet 0  . PROMETHAZINE HCL 25 MG PO TABS Oral Take 1 tablet (25 mg total) by mouth every 6 (six) hours as needed for nausea. 15 tablet 0  . PHENERGAN IJ Injection Inject as directed once.    Willette Brace IJ Injection Inject as directed.      Marland Kitchen  ETONOGESTREL 68 MG [place] IMPL Subcutaneous Inject 1 each into the skin once.        BP 133/74  Pulse 82  Temp(Src) 98.7 F (37.1 C) (Oral)  Resp 20  Ht 5\' 5"  (1.651 m)  Wt 232 lb (105.235 kg)  BMI 38.61 kg/m2  SpO2 98%  Physical Exam  Nursing note and vitals reviewed. Constitutional: She is oriented to person, place, and time. She appears well-developed and well-nourished. No distress.  HENT:  Head: Normocephalic and atraumatic.  Mouth/Throat: Oropharynx is clear and moist.  Eyes: EOM are normal. Pupils are equal, round, and reactive to light.  Neck: Normal range of  motion, full passive range of motion without pain and phonation normal. Neck supple. No Brudzinski's sign and no Kernig's sign noted.  Cardiovascular: Normal rate, regular rhythm, normal heart sounds and intact distal pulses.   No murmur heard. Pulmonary/Chest: Effort normal and breath sounds normal. No respiratory distress.  Abdominal: Soft. She exhibits no distension. There is no tenderness.  Musculoskeletal: Normal range of motion. She exhibits no tenderness.  Lymphadenopathy:    She has no cervical adenopathy.  Neurological: She is alert and oriented to person, place, and time. No cranial nerve deficit or sensory deficit. She exhibits normal muscle tone. Coordination and gait normal. GCS eye subscore is 4. GCS verbal subscore is 5. GCS motor subscore is 6.  Skin: Skin is warm and dry.  Psychiatric: She has a normal mood and affect.    ED Course  Procedures (including critical care time)       MDM     I have reviewed previous ED charts and nursing notes. Patient was also seen by her primary care physician for the same earlier today.  Her vitals remained stable she is nontoxic appearing, no meningeal signs. No focal neuro deficits . Patient has also been seen here several times previously for headaches.  A recheck of patient she still reports a diffuse headache with no significant improvement of her symptoms. I discussed the patient's history and exam findings with the EDP. Dr. Bebe Shaggy has evaluated the patient and her care plan was discussed. Patient has an appointment with [facility] neurology next week and she agrees to keep her followup appointment.     Nabeel Gladson L. [place], [place] 03/10/12 0007

## 2012-03-09 NOTE — Discharge Instructions (Signed)
Recurrent Migraine Headache You have a recurrent migraine headache. The caregiver can usually provide good relief for this headache. If this headache is the same as your previous migraine headaches, it is safe to treat you without repeating a complete evaluation.   These headaches usually have at least two of the following problems:   They occur on one side of the head, pulsate, and are severe enough to prevent daily activities.   They are aggravated by daily physical activities.  You may have one or more of the following symptoms:   Nausea (feeling sick to your stomach).   Vomiting.   Pain with exposure to bright lights or loud noises.  Most headache sufferers have a family history of migraines. Your headaches may also be related to alcohol and smoking habits. Too much sleep, too little sleep, mood, and anxiety may also play a part. Changing some of these triggers may help you lower the number and level of pain of the headaches. Headaches may be related to menses (female menstruation). There are numerous medications that can prevent these headaches. Your caregiver can help you with a medication or regimen (procedure to follow). If this has been a chronic (long-term) condition, the use of long-term narcotics is not recommended. Using long-term narcotics can cause recurrent migraines. Narcotics are only a temporary measure only. They are used for the infrequent migraine that fails to respond to all other measures. SEEK MEDICAL CARE IF:    You do not get relief from the medications given to you.   You have a recurrence of pain.   This headache begins to differ from past migraine (for example if it is more severe).  SEEK IMMEDIATE MEDICAL CARE IF:  You have a fever.   You have a stiff neck.   You have vision loss or have changes in vision.   You have problems with feeling lightheaded, become faint, or lose your balance.   You have muscular weakness.   You have loss of muscular  control.   You develop severe symptoms different from your first symptoms.   You start losing your balance or have trouble walking.   You feel faint or pass out.  MAKE SURE YOU:    Understand these instructions.   Will watch your condition.   Will get help right away if you are not doing well or get worse.  Document Released: 09/09/2001 Document Revised: 12/04/2011 Document Reviewed: 08/03/2008 [employer] Patient Information 2012 [employer]. 

## 2012-03-09 NOTE — ED Notes (Signed)
Pt with migraine x 6 days.  Seen previous for same.  Has apt with neurologist.

## 2012-03-11 NOTE — ED Provider Notes (Signed)
Medical screening examination/treatment/procedure(s) were conducted as a shared visit with non-physician practitioner(s) and myself.  I personally evaluated the patient during the encounter  Pt well appearing, no distress, no neuro deficits.  Suspicion for acute neurologic process is low  Joya Gaskins, MD 03/11/12 1658

## 2012-04-26 ENCOUNTER — Encounter (HOSPITAL_COMMUNITY): Payer: Self-pay | Admitting: Emergency Medicine

## 2012-04-26 ENCOUNTER — Emergency Department (HOSPITAL_COMMUNITY)
Admission: EM | Admit: 2012-04-26 | Discharge: 2012-04-26 | Disposition: A | Discharge status: Home / Self Care | Payer: BC Managed Care – PPO | Attending: Emergency Medicine | Admitting: Emergency Medicine

## 2012-04-26 DIAGNOSIS — R112 Nausea with vomiting, unspecified: Secondary | ICD-10-CM | POA: Insufficient documentation

## 2012-04-26 DIAGNOSIS — Z79899 Other long term (current) drug therapy: Secondary | ICD-10-CM | POA: Insufficient documentation

## 2012-04-26 DIAGNOSIS — J45909 Unspecified asthma, uncomplicated: Secondary | ICD-10-CM | POA: Insufficient documentation

## 2012-04-26 DIAGNOSIS — F341 Dysthymic disorder: Secondary | ICD-10-CM | POA: Insufficient documentation

## 2012-04-26 DIAGNOSIS — R51 Headache: Secondary | ICD-10-CM | POA: Insufficient documentation

## 2012-04-26 MED ORDER — DIPHENHYDRAMINE HCL 50 MG/ML IJ SOLN
50.0000 mg | Freq: Once | INTRAMUSCULAR | Status: AC
  Administered 2012-04-26: 50 mg via INTRAMUSCULAR
  Filled 2012-04-26: qty 1

## 2012-04-26 MED ORDER — DROPERIDOL 2.5 MG/ML IJ SOLN
1.2500 mg | Freq: Once | INTRAMUSCULAR | Status: AC
  Administered 2012-04-26: 1.25 mg via INTRAVENOUS
  Filled 2012-04-26: qty 2

## 2012-04-26 MED ORDER — DEXTROSE 5 % IV SOLN
500.0000 mg | Freq: Once | INTRAVENOUS | Status: AC
  Administered 2012-04-26: 500 mg via INTRAVENOUS
  Filled 2012-04-26: qty 5

## 2012-04-26 MED ORDER — DEXAMETHASONE SODIUM PHOSPHATE 4 MG/ML IJ SOLN
10.0000 mg | Freq: Once | INTRAMUSCULAR | Status: AC
  Administered 2012-04-26: 10 mg via INTRAVENOUS
  Filled 2012-04-26: qty 2
  Filled 2012-04-26: qty 1

## 2012-04-26 MED ORDER — SODIUM CHLORIDE 0.9 % IV BOLUS (SEPSIS)
1000.0000 mL | Freq: Once | INTRAVENOUS | Status: AC
  Administered 2012-04-26: 1000 mL via INTRAVENOUS

## 2012-04-26 MED ORDER — METOCLOPRAMIDE HCL 5 MG/ML IJ SOLN
10.0000 mg | Freq: Once | INTRAMUSCULAR | Status: AC
  Administered 2012-04-26: 10 mg via INTRAMUSCULAR
  Filled 2012-04-26: qty 2

## 2012-04-26 MED ORDER — KETOROLAC TROMETHAMINE 60 MG/2ML IM SOLN
60.0000 mg | Freq: Once | INTRAMUSCULAR | Status: AC
  Administered 2012-04-26: 60 mg via INTRAMUSCULAR
  Filled 2012-04-26: qty 2

## 2012-04-26 NOTE — ED Notes (Signed)
History of same. Took imitrex last at 5am with no relief.

## 2012-04-26 NOTE — Discharge Instructions (Signed)

## 2012-04-26 NOTE — ED Provider Notes (Signed)
History   This chart was scribed for Glynn Octave, MD by Clarita Crane. The patient was seen in room APA19/APA19. Patient's care was started at 0935.    CSN: [id number]  Arrival date & time 04/26/12  1191   First MD Initiated Contact with Patient 04/26/12 [phone]      Chief Complaint  Patient presents with  . Migraine    (Consider location/radiation/quality/duration/timing/severity/associated sxs/prior treatment) HPI Barbara Jennings is a 31 y.o. female who presents to the Emergency Department complaining of constant moderate to severe HA described as similar to previous migraines onset this morning upon awaking and persistent since with associated nausea and vomiting. Patient states HA is aggravated with exposure to light and noise and was not relieved with use of Imitrex this morning. Denies chest pain, abdominal pain, fever, chills, SOB. Patient with h/o asthma, anxiety, panic attack, migraines.  Past Medical History  Diagnosis Date  . Asthma   . Anxiety   . Panic attack   . Depression   . Migraine     Past Surgical History  Procedure Date  . Cesarean section   . Ovarian cyst removal   . Appendectomy   . Cholecystectomy     History reviewed. No pertinent family history.  History  Substance Use Topics  . Smoking status: Current Everyday Smoker -- 0.5 packs/day    Types: Cigarettes  . Smokeless tobacco: Not on file  . Alcohol Use: No    OB History    Grav Para Term Preterm Abortions TAB SAB Ect Mult Living                  Review of Systems A complete 10 system review of systems was obtained and all systems are negative except as noted in the HPI and PMH.   Allergies  Coconut flavor  Home Medications   Current Outpatient Rx  Name Route Sig Dispense Refill  . ALBUTEROL SULFATE HFA 108 (90 BASE) MCG/ACT IN AERS Inhalation Inhale 2 puffs into the lungs every 6 (six) hours as needed. For Shortness of Breath     . SUMATRIPTAN SUCCINATE 6 MG/0.5ML [place] SOLN  Subcutaneous Inject 6 mg into the skin every 2 (two) hours as needed. Migraine    . ETONOGESTREL 68 MG [place] IMPL Subcutaneous Inject 1 each into the skin once.        BP 137/71  Pulse 82  Temp(Src) 98.7 F (37.1 C) (Oral)  Resp 20  Ht 5\' 6"  (1.676 m)  Wt 243 lb (110.224 kg)  BMI 39.22 kg/m2  SpO2 99%  LMP 04/19/2012  Physical Exam  Nursing note and vitals reviewed. Constitutional: She is oriented to person, place, and time. She appears well-developed and well-nourished. No distress.  HENT:  Head: Normocephalic and atraumatic.  Eyes: EOM are normal. Pupils are equal, round, and reactive to light.       Unable to evaluate for papilledema.   Neck: Neck supple. No tracheal deviation present.       No meningismus.   Cardiovascular: Normal rate and regular rhythm.  Exam reveals no gallop and no friction rub.   No murmur heard. Pulmonary/Chest: Effort normal. No respiratory distress. She has no wheezes. She has no rales.  Abdominal: Soft. She exhibits no distension. There is no tenderness.  Musculoskeletal: Normal range of motion. She exhibits no edema.  Neurological: She is alert and oriented to person, place, and time. She has normal strength. No cranial nerve deficit or sensory deficit. Coordination normal.  Skin: Skin  is warm and dry.  Psychiatric: She has a normal mood and affect. Her behavior is normal.    ED Course  Procedures (including critical care time)  DIAGNOSTIC STUDIES: Oxygen Saturation is 99% on room air, normal by my interpretation.    COORDINATION OF CARE: 10:00AM-Patient informed of current plan for treatment and evaluation and agrees with plan at this time.  10:58AM- Patient states HA is still present at this time.  11:55AM- Patient states HA persists after administration of additional medications.  Labs Reviewed - No data to display No results found.   No diagnosis found.    MDM  Typical migraine headache that woke from sleep. No weakness, numbness,  tingling, fever, vomiting.   Nonfocal neuro exam, no meningismus.   Headache improved after medication administration.  I personally performed the services described in this documentation, which was scribed in my presence.  The recorded information has been reviewed and considered.    Glynn Octave, MD 04/26/12 1459

## 2012-05-13 ENCOUNTER — Emergency Department (HOSPITAL_COMMUNITY)
Admission: EM | Admit: 2012-05-13 | Discharge: 2012-05-13 | Disposition: A | Discharge status: Home / Self Care | Payer: BC Managed Care – PPO | Attending: Emergency Medicine | Admitting: Emergency Medicine

## 2012-05-13 ENCOUNTER — Encounter (HOSPITAL_COMMUNITY): Payer: Self-pay | Admitting: *Deleted

## 2012-05-13 DIAGNOSIS — M79609 Pain in unspecified limb: Secondary | ICD-10-CM | POA: Insufficient documentation

## 2012-05-13 DIAGNOSIS — F411 Generalized anxiety disorder: Secondary | ICD-10-CM | POA: Insufficient documentation

## 2012-05-13 DIAGNOSIS — W57XXXA Bitten or stung by nonvenomous insect and other nonvenomous arthropods, initial encounter: Secondary | ICD-10-CM | POA: Insufficient documentation

## 2012-05-13 DIAGNOSIS — R609 Edema, unspecified: Secondary | ICD-10-CM | POA: Insufficient documentation

## 2012-05-13 DIAGNOSIS — R112 Nausea with vomiting, unspecified: Secondary | ICD-10-CM | POA: Insufficient documentation

## 2012-05-13 DIAGNOSIS — S40269A Insect bite (nonvenomous) of unspecified shoulder, initial encounter: Secondary | ICD-10-CM | POA: Insufficient documentation

## 2012-05-13 DIAGNOSIS — J45909 Unspecified asthma, uncomplicated: Secondary | ICD-10-CM | POA: Insufficient documentation

## 2012-05-13 DIAGNOSIS — F329 Major depressive disorder, single episode, unspecified: Secondary | ICD-10-CM | POA: Insufficient documentation

## 2012-05-13 DIAGNOSIS — M7989 Other specified soft tissue disorders: Secondary | ICD-10-CM | POA: Insufficient documentation

## 2012-05-13 DIAGNOSIS — F17200 Nicotine dependence, unspecified, uncomplicated: Secondary | ICD-10-CM | POA: Insufficient documentation

## 2012-05-13 DIAGNOSIS — Z79899 Other long term (current) drug therapy: Secondary | ICD-10-CM | POA: Insufficient documentation

## 2012-05-13 DIAGNOSIS — F3289 Other specified depressive episodes: Secondary | ICD-10-CM | POA: Insufficient documentation

## 2012-05-13 MED ORDER — OXYCODONE-ACETAMINOPHEN 5-325 MG PO TABS: 2.0000 | ORAL_TABLET | ORAL | Status: AC | PRN

## 2012-05-13 MED ORDER — DIPHENHYDRAMINE HCL 25 MG PO TABS: 25.0000 mg | ORAL_TABLET | Freq: Four times a day (QID) | ORAL | Status: DC

## 2012-05-13 MED ORDER — CEPHALEXIN 500 MG PO CAPS: 500.0000 mg | ORAL_CAPSULE | Freq: Four times a day (QID) | ORAL | Status: AC

## 2012-05-13 NOTE — ED Provider Notes (Cosign Needed Addendum)
History  This chart was scribed for Carleene Cooper III, MD by Stevphen Meuse. This patient was seen in room APA11/APA11 and the patient's care was started at 10:45 A.Judie Petit   CSN: [id number]  Arrival date & time 05/13/12  1004   First MD Initiated Contact with Patient 05/13/12 1032      Chief Complaint  Patient presents with  . Insect Bite    (Consider location/radiation/quality/duration/timing/severity/associated sxs/prior treatment) The history is provided by the patient. No language interpreter was used.   AME HEAGLE is a 31 y.o. female who presents to the Emergency Department complaining of approximately 24 hours of sudden onset, gradually worsening non radiating insect bite. She states that she was outside with her daughter yesterday and she felt something like a mosquito bite but did not see the actual bite. She says that she took an hour nap afterwards and woke up to swelling in her right upper arm. She states that she woke up today with n/v and  pain and swelling in her same arm. Pt denies fever, SOB and dizziness as associated symptoms. Pt denies any modifying factors. Pt has a h/o panic attacks, depression, anxiety, migraines but is otherwise normally healthy. Pt is a current everyday smoker but denies a h/o alcohol use.  Pt's PCP: Dr Genia Hotter  Past Medical History  Diagnosis Date  . Asthma   . Anxiety   . Panic attack   . Depression   . Migraine     Past Surgical History  Procedure Date  . Cesarean section   . Ovarian cyst removal   . Appendectomy   . Cholecystectomy     No family history on file.  History  Substance Use Topics  . Smoking status: Current Everyday Smoker -- 0.5 packs/day    Types: Cigarettes  . Smokeless tobacco: Not on file  . Alcohol Use: No    OB History    Grav Para Term Preterm Abortions TAB SAB Ect Mult Living                  Review of Systems  Constitutional: Negative for fever.  Respiratory: Negative for shortness of  breath.   Gastrointestinal: Positive for nausea and vomiting.  Neurological: Negative for dizziness.    Allergies  Coconut flavor  Home Medications   Current Outpatient Rx  Name Route Sig Dispense Refill  . ALBUTEROL SULFATE HFA 108 (90 BASE) MCG/ACT IN AERS Inhalation Inhale 2 puffs into the lungs every 6 (six) hours as needed. For Shortness of Breath     . ETONOGESTREL 68 MG [place] IMPL Subcutaneous Inject 1 each into the skin once.      . SUMATRIPTAN SUCCINATE 6 MG/0.5ML [place] SOLN Subcutaneous Inject 6 mg into the skin every 2 (two) hours as needed. Migraine      Triage Vitals: BP 126/77  Pulse 98  Temp(Src) 98 F (36.7 C) (Oral)  Resp 20  Ht 5\' 6"  (1.676 m)  Wt 233 lb (105.688 kg)  BMI 37.61 kg/m2  SpO2 97%  LMP 04/19/2012  Physical Exam  Nursing note and vitals reviewed. Constitutional: She is oriented to person, place, and time. She appears well-developed and well-nourished.  HENT:  Head: Normocephalic and atraumatic.  Eyes: Conjunctivae and EOM are normal. Pupils are equal, round, and reactive to light.  Neck: Normal range of motion. Neck supple.  Cardiovascular: Normal rate and regular rhythm.   Pulmonary/Chest: Effort normal and breath sounds normal.  Abdominal: Soft. Bowel sounds are normal.  Musculoskeletal: Normal  range of motion. She exhibits edema (Mild swelling of upper arm around area of suspected insect bite).  Neurological: She is alert and oriented to person, place, and time.  Skin: Skin is warm and dry.  Psychiatric: She has a normal mood and affect. Thought content normal.    ED Course  Procedures (including critical care time) DIAGNOSTIC STUDIES: Oxygen Saturation is 97% on room air, adequate by my interpretation.    COORDINATION OF CARE:  10:54AM: Advised pt that she would need to take a antiboitic to help fight the infection. Also prescribed antihistamine and pain medication to help alleviate her symptoms. Also advised pt of the signs that she  would need to look for to return to the hospital  11:14 AM Patient to be treated for her infected insect bite with Keflex 500 mg 4 times a day, Benadryl 25 mg every 4 hours if needed for itching, and Percocet every 4 hours if needed for pain. She should return to the hospital ED if she has a high fever, increased swelling or pain that is uncontrolled, or red streaks going up her arm.  1. Infected insect bite      I personally performed the services described in this documentation, which was scribed in my presence. The recorded information has been reviewed and considered.  Osvaldo Human, M.D.     Carleene Cooper III, MD 05/13/12 1115  Carleene Cooper III, MD 05/13/12 1130

## 2012-05-13 NOTE — Discharge Instructions (Signed)
Barbara Jennings, you have an infected insect bite on the right upper arm.  Take the antibiotic Keflex 500 mg four times per day for one week to treat the infection.  Take the antihistamine medicine Benadryl 25 mg every 4 hours if needed for itching.  Take the pain medicine Percocet every 4 hours if needed for pain.  Return to the ED if you develop a fever, have pain not controlled by your medication, or if you see swelling going up your arm.

## 2012-05-13 NOTE — ED Notes (Signed)
Redness, swelling, itching to right upper arm since yesterday.  Reports was outside when area started itching.  Woke up this morning with pain, swelling, n/v, and neck pain.

## 2012-06-03 ENCOUNTER — Encounter (HOSPITAL_COMMUNITY): Payer: Self-pay | Admitting: *Deleted

## 2012-06-03 ENCOUNTER — Emergency Department (HOSPITAL_COMMUNITY)
Admission: EM | Admit: 2012-06-03 | Discharge: 2012-06-03 | Disposition: A | Discharge status: Home / Self Care | Payer: BC Managed Care – PPO | Attending: Emergency Medicine | Admitting: Emergency Medicine

## 2012-06-03 DIAGNOSIS — F411 Generalized anxiety disorder: Secondary | ICD-10-CM | POA: Insufficient documentation

## 2012-06-03 DIAGNOSIS — J45909 Unspecified asthma, uncomplicated: Secondary | ICD-10-CM | POA: Insufficient documentation

## 2012-06-03 DIAGNOSIS — F329 Major depressive disorder, single episode, unspecified: Secondary | ICD-10-CM | POA: Insufficient documentation

## 2012-06-03 DIAGNOSIS — R51 Headache: Secondary | ICD-10-CM

## 2012-06-03 DIAGNOSIS — F3289 Other specified depressive episodes: Secondary | ICD-10-CM | POA: Insufficient documentation

## 2012-06-03 DIAGNOSIS — G43909 Migraine, unspecified, not intractable, without status migrainosus: Secondary | ICD-10-CM | POA: Insufficient documentation

## 2012-06-03 MED ORDER — DIPHENHYDRAMINE HCL 50 MG/ML IJ SOLN
50.0000 mg | Freq: Once | INTRAMUSCULAR | Status: AC
  Administered 2012-06-03: 50 mg via INTRAMUSCULAR
  Filled 2012-06-03: qty 1

## 2012-06-03 MED ORDER — METOCLOPRAMIDE HCL 5 MG/ML IJ SOLN
10.0000 mg | Freq: Once | INTRAMUSCULAR | Status: AC
  Administered 2012-06-03: 10 mg via INTRAMUSCULAR
  Filled 2012-06-03: qty 2

## 2012-06-03 NOTE — ED Provider Notes (Signed)
History  This chart was scribed for Joya Gaskins, MD by Bennett Scrape. This patient was seen in room APA04/APA04 and the patient's care was started at 8:49PM.   CSN: [id number]  Arrival date & time 06/03/12  2033   First MD Initiated Contact with Patient 06/03/12 2049      Chief Complaint  Patient presents with  . Headache    Patient is a 31 y.o. female presenting with headaches. The history is provided by the patient. No language interpreter was used.  Headache  This is a recurrent problem. The current episode started 12 to 24 hours ago. The problem occurs constantly. The problem has been gradually worsening. The quality of the pain is described as throbbing. The pain does not radiate.    Barbara Jennings is a 31 y.o. female who presents to the Emergency Department complaining of 19 hours of gradual onset, gradually worsening, constant diffuse HA described as a pounding sensation that woke her up this morning with associated nausea and non-bloody emesis. The pain does not radiate. She reports a 101 fever earlier today that has resolved. She denies taking any OTC medications for her symptoms. Pt has a h/o migraines and when asked how often she gets HAs, she responded "Too often". She denies weakness, rash, cough, rhinorrhea, back pain and chest pain as associated symptoms. She has a h/o asthma, anxiety and depression. She is a current everyday smoker but denies alcohol use.  PCP is Dr. Genia Hotter.  Past Medical History  Diagnosis Date  . Asthma   . Anxiety   . Panic attack   . Depression   . Migraine     Past Surgical History  Procedure Date  . Cesarean section   . Ovarian cyst removal   . Appendectomy   . Cholecystectomy     No family history on file.  History  Substance Use Topics  . Smoking status: Current Everyday Smoker -- 0.5 packs/day    Types: Cigarettes  . Smokeless tobacco: Not on file  . Alcohol Use: No     Review of Systems   A complete 10 system  review of systems was obtained and all systems are negative except as noted in the HPI and PMH.   Allergies  Coconut flavor  Home Medications   Current Outpatient Rx  Name Route Sig Dispense Refill  . ALBUTEROL SULFATE HFA 108 (90 BASE) MCG/ACT IN AERS Inhalation Inhale 2 puffs into the lungs every 6 (six) hours as needed. For Shortness of Breath     . DIPHENHYDRAMINE HCL 25 MG PO TABS Oral Take 1 tablet (25 mg total) by mouth every 6 (six) hours. 20 tablet 0  . ETONOGESTREL 68 MG [place] IMPL Subcutaneous Inject 1 each into the skin once.      . SUMATRIPTAN SUCCINATE 6 MG/0.5ML [place] SOLN Subcutaneous Inject 6 mg into the skin every 2 (two) hours as needed. Migraine      Triage Vitals: BP 120/68  Pulse 74  Temp 98.2 F (36.8 C)  Resp 20  Ht 5\' 6"  (1.676 m)  Wt 234 lb (106.142 kg)  BMI 37.77 kg/m2  SpO2 100%  Physical Exam  Nursing note and vitals reviewed.  CONSTITUTIONAL: Well developed/well nourished HEAD AND FACE: Normocephalic/atraumatic EYES: EOMI/PERRL, no nystagmus ENMT: Mucous membranes moist NECK: supple no meningeal signs SPINE:entire spine nontender CV: S1/S2 noted, no murmurs/rubs/gallops noted LUNGS: Lungs are clear to auscultation bilaterally, no apparent distress ABDOMEN: soft, nontender, no rebound or guarding GU:no cva tenderness NEURO:  Pt is awake/alert, moves all extremitiesx4, facies symmetric, no arm or leg drift is noted Cranial nerves 3/4/5/6/07/06/09/11/12 tested and intact Gait normal,  No past pointing EXTREMITIES: pulses normal, full ROM SKIN: warm, color normal PSYCH: no abnormalities of mood noted  ED Course  Procedures   DIAGNOSTIC STUDIES: Oxygen Saturation is 100% on room air, normal by my interpretation.    COORDINATION OF CARE: 9:13Pm-Discussed treatment plan with pt and pt agreed.  Pt with multiple visits to the ED over the past several months for migraine headache.  Suspicion for acute neurologic process is low (reported fever at home,  but none here and did not take any antipyretics) doubt meningitis  also reports similar to prior headaches.  No neuro deficits.  She is ambulatory.  She needs outpatient management of these headaches   The patient appears reasonably screened and/or stabilized for discharge and I doubt any other medical condition or other [facility] requiring further screening, evaluation, or treatment in the ED at this time prior to discharge.     MDM  Nursing notes including past medical history, social history and family history reviewed and considered in documentation Previous records reviewed and considered       I personally performed the services described in this documentation, which was scribed in my presence. The recorded information has been reviewed and considered.      Joya Gaskins, MD 06/03/12 2312

## 2012-06-03 NOTE — ED Notes (Signed)
Headache with vomiting onset 0200 today

## 2012-06-03 NOTE — Discharge Instructions (Signed)

## 2012-10-31 ENCOUNTER — Encounter (HOSPITAL_COMMUNITY): Payer: Self-pay | Admitting: *Deleted

## 2012-10-31 ENCOUNTER — Emergency Department (HOSPITAL_COMMUNITY): Payer: BC Managed Care – PPO

## 2012-10-31 ENCOUNTER — Emergency Department (HOSPITAL_COMMUNITY)
Admission: EM | Admit: 2012-10-31 | Discharge: 2012-10-31 | Disposition: A | Discharge status: Home / Self Care | Payer: BC Managed Care – PPO | Attending: Emergency Medicine | Admitting: Emergency Medicine

## 2012-10-31 DIAGNOSIS — F411 Generalized anxiety disorder: Secondary | ICD-10-CM | POA: Insufficient documentation

## 2012-10-31 DIAGNOSIS — M791 Myalgia, unspecified site: Secondary | ICD-10-CM

## 2012-10-31 DIAGNOSIS — IMO0001 Reserved for inherently not codable concepts without codable children: Secondary | ICD-10-CM | POA: Insufficient documentation

## 2012-10-31 DIAGNOSIS — R05 Cough: Secondary | ICD-10-CM | POA: Insufficient documentation

## 2012-10-31 DIAGNOSIS — F17200 Nicotine dependence, unspecified, uncomplicated: Secondary | ICD-10-CM | POA: Insufficient documentation

## 2012-10-31 DIAGNOSIS — G43909 Migraine, unspecified, not intractable, without status migrainosus: Secondary | ICD-10-CM | POA: Insufficient documentation

## 2012-10-31 DIAGNOSIS — R51 Headache: Secondary | ICD-10-CM

## 2012-10-31 DIAGNOSIS — Z91018 Allergy to other foods: Secondary | ICD-10-CM | POA: Insufficient documentation

## 2012-10-31 DIAGNOSIS — F329 Major depressive disorder, single episode, unspecified: Secondary | ICD-10-CM | POA: Insufficient documentation

## 2012-10-31 DIAGNOSIS — R1110 Vomiting, unspecified: Secondary | ICD-10-CM | POA: Insufficient documentation

## 2012-10-31 DIAGNOSIS — F3289 Other specified depressive episodes: Secondary | ICD-10-CM | POA: Insufficient documentation

## 2012-10-31 DIAGNOSIS — J45909 Unspecified asthma, uncomplicated: Secondary | ICD-10-CM | POA: Insufficient documentation

## 2012-10-31 DIAGNOSIS — R059 Cough, unspecified: Secondary | ICD-10-CM | POA: Insufficient documentation

## 2012-10-31 DIAGNOSIS — R509 Fever, unspecified: Secondary | ICD-10-CM | POA: Insufficient documentation

## 2012-10-31 DIAGNOSIS — Z79899 Other long term (current) drug therapy: Secondary | ICD-10-CM | POA: Insufficient documentation

## 2012-10-31 DIAGNOSIS — M542 Cervicalgia: Secondary | ICD-10-CM | POA: Insufficient documentation

## 2012-10-31 LAB — COMPREHENSIVE METABOLIC PANEL
Alkaline Phosphatase: 67 U/L (ref 39–117)
BUN: 8 mg/dL (ref 6–23)
Creatinine, Ser: 0.64 mg/dL (ref 0.50–1.10)
GFR calc Af Amer: >90 mL/min (ref >90)
Glucose, Bld: 109 mg/dL — ABNORMAL HIGH (ref 70–99)
Potassium: 3.4 mEq/L — ABNORMAL LOW (ref 3.5–5.1)
Total Bilirubin: 0.3 mg/dL (ref 0.3–1.2)
Total Protein: 7 g/dL (ref 6.0–8.3)

## 2012-10-31 LAB — URINE MICROSCOPIC-ADD ON

## 2012-10-31 LAB — URINALYSIS, ROUTINE W REFLEX MICROSCOPIC
Ketones, ur: NEGATIVE mg/dL
Leukocytes, UA: NEGATIVE
Nitrite: NEGATIVE
Protein, ur: NEGATIVE mg/dL
pH: 6 (ref 5.0–8.0)

## 2012-10-31 LAB — CBC WITH DIFFERENTIAL/PLATELET
Basophils Relative: 1 % (ref 0–1)
Eosinophils Absolute: 0.2 10*3/uL (ref 0.0–0.7)
HCT: 37.3 % (ref 36.0–46.0)
Hemoglobin: 12.8 g/dL (ref 12.0–15.0)
Lymphs Abs: 3 10*3/uL (ref 0.7–4.0)
MCH: 28.9 pg (ref 26.0–34.0)
MCHC: 34.3 g/dL (ref 30.0–36.0)
MCV: 84.2 fL (ref 78.0–100.0)
Monocytes Absolute: 0.4 10*3/uL (ref 0.1–1.0)
Monocytes Relative: 4 % (ref 3–12)
Neutrophils Relative %: 59 % (ref 43–77)
RBC: 4.43 MIL/uL (ref 3.87–5.11)

## 2012-10-31 LAB — PREGNANCY, URINE: Preg Test, Ur: NEGATIVE

## 2012-10-31 MED ORDER — KETOROLAC TROMETHAMINE 30 MG/ML IJ SOLN
30.0000 mg | Freq: Once | INTRAMUSCULAR | Status: AC
  Administered 2012-10-31: 30 mg via INTRAVENOUS
  Filled 2012-10-31: qty 1

## 2012-10-31 MED ORDER — SODIUM CHLORIDE 0.9 % IV BOLUS (SEPSIS)
1000.0000 mL | Freq: Once | INTRAVENOUS | Status: AC
  Administered 2012-10-31: 1000 mL via INTRAVENOUS

## 2012-10-31 MED ORDER — IBUPROFEN 800 MG PO TABS: 800.0000 mg | ORAL_TABLET | Freq: Three times a day (TID) | ORAL | Status: DC

## 2012-10-31 MED ORDER — VALPROATE SODIUM 500 MG/5ML IV SOLN
INTRAVENOUS | Status: AC
  Filled 2012-10-31: qty 5

## 2012-10-31 MED ORDER — ONDANSETRON HCL 4 MG/2ML IJ SOLN
4.0000 mg | Freq: Once | INTRAMUSCULAR | Status: AC
  Administered 2012-10-31: 4 mg via INTRAVENOUS
  Filled 2012-10-31: qty 2

## 2012-10-31 MED ORDER — DEXAMETHASONE SODIUM PHOSPHATE 4 MG/ML IJ SOLN
10.0000 mg | Freq: Once | INTRAMUSCULAR | Status: AC
  Administered 2012-10-31: 10 mg via INTRAVENOUS
  Filled 2012-10-31: qty 3
  Filled 2012-10-31: qty 1

## 2012-10-31 MED ORDER — METOCLOPRAMIDE HCL 5 MG/ML IJ SOLN
10.0000 mg | Freq: Once | INTRAMUSCULAR | Status: AC
  Administered 2012-10-31: 10 mg via INTRAVENOUS
  Filled 2012-10-31: qty 2

## 2012-10-31 MED ORDER — VALPROATE SODIUM 500 MG/5ML IV SOLN
500.0000 mg | Freq: Once | INTRAVENOUS | Status: AC
  Administered 2012-10-31: 500 mg via INTRAVENOUS
  Filled 2012-10-31: qty 5

## 2012-10-31 NOTE — ED Provider Notes (Signed)
History  This chart was scribed for Glynn Octave, MD by Ardeen Jourdain. This patient was seen in room APA11/APA11 and the patient's care was started at 1827.  CSN: [id number]  Arrival date & time 10/31/12  1819   First MD Initiated Contact with Patient 10/31/12 1827      Chief Complaint  Patient presents with  . Neck Pain  . Emesis     The history is provided by the patient. No language interpreter was used.   Barbara Jennings is a 31 y.o. female who presents to the Emergency Department complaining of HA with associated generalized body aches, neck pain, stiffness, cough, fever of 101 and emesis. She denies painful urination, hematuria, abdominal pain or injury. She states that the symptoms began last night but suddenly worsened at 9:00 AM this morning and have been constant since. She took Excedrin migraine for the pain this morning with no relief. She reports having possible sick contact. She has a h/o migraine, asthma and anxiety. She is a current everyday smoker but denies alcohol use.   Past Medical History  Diagnosis Date  . Asthma   . Anxiety   . Panic attack   . Depression   . Migraine     Past Surgical History  Procedure Date  . Cesarean section   . Ovarian cyst removal   . Appendectomy   . Cholecystectomy     No family history on file.  History  Substance Use Topics  . Smoking status: Current Every Day Smoker -- 0.5 packs/day    Types: Cigarettes  . Smokeless tobacco: Not on file  . Alcohol Use: No   No OB history available.    Review of Systems  All other systems reviewed and are negative.  A complete 10 system review of systems was obtained and all systems are negative except as noted in the HPI and PMH.    Allergies  Coconut flavor  Home Medications   Current Outpatient Rx  Name  Route  Sig  Dispense  Refill  . DIPHENHYDRAMINE HCL 25 MG PO TABS   Oral   Take 1 tablet (25 mg total) by mouth every 6 (six) hours.   20 tablet   0   .  ETONOGESTREL 68 MG Mertzon IMPL   Subcutaneous   Inject 1 each into the skin once.             Triage Vitals: BP 137/79  Pulse 95  Temp 98.2 F (36.8 C) (Oral)  Resp 20  Ht 5\' 6"  (1.676 m)  Wt 238 lb (107.956 kg)  BMI 38.41 kg/m2  SpO2 100%  Physical Exam  Nursing note and vitals reviewed. Constitutional: She is oriented to person, place, and time. She appears well-developed and well-nourished. No distress.  HENT:  Head: Normocephalic and atraumatic.  Eyes: EOM are normal. Pupils are equal, round, and reactive to light.  Neck: Normal range of motion. Neck supple. No tracheal deviation present.  Cardiovascular: Normal rate, regular rhythm and normal heart sounds.   Pulmonary/Chest: Effort normal and breath sounds normal. No respiratory distress.  Abdominal: Soft. She exhibits no distension. There is no tenderness.  Musculoskeletal: Normal range of motion. She exhibits no edema.       Paraspinal cervical tendeness  Neurological: She is alert and oriented to person, place, and time.       2-12 cranial nerves intact, 5-5 strength throughout, no meningismus, no ataxia  Skin: Skin is warm and dry.  Psychiatric: She has a  normal mood and affect. Her behavior is normal.    ED Course  Procedures (including critical care time)  DIAGNOSTIC STUDIES: Oxygen Saturation is 100% on room air, normal by my interpretation.    COORDINATION OF CARE:  6:36 PM: Discussed treatment plan which includes a CXR and blood work with pt at bedside and pt agreed to plan.  6:45 PM: Medication orders- sodium chloride 0.9 % bolus 1,000 mL Once, ondansetron (ZOFRAN) injection 4 mg Once, ketorolac (TORADOL) 30 MG/ML injection 30 mg Once, metoCLOPramide (REGLAN) injection 10 mg Once   9:20 PM: Pt recheck, she appears comfortable and normal        Results for orders placed during the hospital encounter of 10/31/12  CBC WITH DIFFERENTIAL      Component Value Range   WBC 8.8  4.0 - 10.5 K/uL   RBC 4.43   3.87 - 5.11 MIL/uL   Hemoglobin 12.8  12.0 - 15.0 g/dL   HCT 14.7  82.9 - 56.2 %   MCV 84.2  78.0 - 100.0 fL   MCH 28.9  26.0 - 34.0 pg   MCHC 34.3  30.0 - 36.0 g/dL   RDW 13.0  86.5 - 78.4 %   Platelets 303  150 - 400 K/uL   Neutrophils Relative 59  43 - 77 %   Neutro Abs 5.2  1.7 - 7.7 K/uL   Lymphocytes Relative 34  12 - 46 %   Lymphs Abs 3.0  0.7 - 4.0 K/uL   Monocytes Relative 4  3 - 12 %   Monocytes Absolute 0.4  0.1 - 1.0 K/uL   Eosinophils Relative 2  0 - 5 %   Eosinophils Absolute 0.2  0.0 - 0.7 K/uL   Basophils Relative 1  0 - 1 %   Basophils Absolute 0.0  0.0 - 0.1 K/uL  COMPREHENSIVE METABOLIC PANEL      Component Value Range   Sodium 135  135 - 145 mEq/L   Potassium 3.4 (*) 3.5 - 5.1 mEq/L   Chloride 101  96 - 112 mEq/L   CO2 24  19 - 32 mEq/L   Glucose, Bld 109 (*) 70 - 99 mg/dL   BUN 8  6 - 23 mg/dL   Creatinine, Ser 6.96  0.50 - 1.10 mg/dL   Calcium 9.0  8.4 - 29.5 mg/dL   Total Protein 7.0  6.0 - 8.3 g/dL   Albumin 3.4 (*) 3.5 - 5.2 g/dL   AST 18  0 - 37 U/L   ALT 16  0 - 35 U/L   Alkaline Phosphatase 67  39 - 117 U/L   Total Bilirubin 0.3  0.3 - 1.2 mg/dL   GFR calc non Af Amer >90  >90 mL/min   GFR calc Af Amer >90  >90 mL/min  URINALYSIS, ROUTINE W REFLEX MICROSCOPIC      Component Value Range   Color, Urine YELLOW  YELLOW   APPearance CLEAR  CLEAR   Specific Gravity, Urine 1.020  1.005 - 1.030   pH 6.0  5.0 - 8.0   Glucose, UA NEGATIVE  NEGATIVE mg/dL   Hgb urine dipstick SMALL (*) NEGATIVE   Bilirubin Urine NEGATIVE  NEGATIVE   Ketones, ur NEGATIVE  NEGATIVE mg/dL   Protein, ur NEGATIVE  NEGATIVE mg/dL   Urobilinogen, UA 0.2  0.0 - 1.0 mg/dL   Nitrite NEGATIVE  NEGATIVE   Leukocytes, UA NEGATIVE  NEGATIVE  PREGNANCY, URINE      Component Value Range  Preg Test, Ur NEGATIVE  NEGATIVE  RAPID STREP SCREEN      Component Value Range   Streptococcus, Group A Screen (Direct) NEGATIVE  NEGATIVE  URINE MICROSCOPIC-ADD ON      Component Value  Range   Squamous Epithelial / LPF FEW (*) RARE   WBC, UA 7-10  <3 WBC/hpf   RBC / HPF 7-10  <3 RBC/hpf   Bacteria, UA MANY (*) RARE   Dg Chest 2 View  10/31/2012  *RADIOLOGY REPORT*  Clinical Data: Cough.  Fever.  Weakness.  Nausea and vomiting. Headache.  2-day history of these symptoms.  CHEST - 2 VIEW  Comparison: One-view chest x-ray 12/30/2011.  Two-view chest x-ray 01/22/2009, 09/20/2008, 05/18/2008.  Findings: Cardiac silhouette upper normal in size to slightly enlarged but stable.  Hilar and mediastinal contours otherwise unremarkable.  Suboptimal inspiration due to body habitus accounts for crowded bronchovascular markings at the bases; taking this into account, lungs clear.  No pleural effusions.  Degenerative changes involving the thoracic spine.  No significant interval change.  IMPRESSION: Suboptimal inspiration.  Stable borderline to mild cardiomegaly. No acute cardiopulmonary disease.   Original Report Authenticated By: Hulan Saas, M.D.      No diagnosis found.    MDM  12 hours of body aches, typical migraine headache, nausea and vomiting generalized weakness. She reports temperature of 101 at home but no fever here. Vital stable, no distress. Nonfocal neurological exam. No meningismus. Paraspinal cervical pain.  Patient reports some symptomatic improvement after medications. She continues to complain of pain in her bilateral shoulders, upper back and neck worse with movement. She has no meningismus. I discussed with the patient clinically did not have meningitis but the only way to be 100%r percent sure when is lumbar puncture. She immediately declined this test stating she's had it before at age 69.  Clinically she appears well does not have bacterial meningitis so I feel is appropriate to defer lumbar puncture at this time per patient's wishes. She will followup if she develops worsening symptoms.  I personally performed the services described in this documentation,  which was scribed in my presence.  The recorded information has been reviewed and considered.    Glynn Octave, MD 10/31/12 [phone]

## 2012-10-31 NOTE — ED Notes (Signed)
Reports body aches and feeling "stiff" all over, states this is worse in her neck - onset last night.  Reports onset of n/v today.

## 2012-11-02 ENCOUNTER — Encounter (HOSPITAL_COMMUNITY): Payer: Self-pay | Admitting: Emergency Medicine

## 2012-11-02 ENCOUNTER — Emergency Department (HOSPITAL_COMMUNITY)
Admission: EM | Admit: 2012-11-02 | Discharge: 2012-11-02 | Disposition: A | Discharge status: Home / Self Care | Payer: BC Managed Care – PPO | Attending: Emergency Medicine | Admitting: Emergency Medicine

## 2012-11-02 ENCOUNTER — Emergency Department (HOSPITAL_COMMUNITY): Payer: BC Managed Care – PPO

## 2012-11-02 DIAGNOSIS — F17200 Nicotine dependence, unspecified, uncomplicated: Secondary | ICD-10-CM | POA: Insufficient documentation

## 2012-11-02 DIAGNOSIS — Z8669 Personal history of other diseases of the nervous system and sense organs: Secondary | ICD-10-CM | POA: Insufficient documentation

## 2012-11-02 DIAGNOSIS — J45909 Unspecified asthma, uncomplicated: Secondary | ICD-10-CM | POA: Insufficient documentation

## 2012-11-02 DIAGNOSIS — B349 Viral infection, unspecified: Secondary | ICD-10-CM

## 2012-11-02 DIAGNOSIS — M542 Cervicalgia: Secondary | ICD-10-CM | POA: Insufficient documentation

## 2012-11-02 DIAGNOSIS — Z8659 Personal history of other mental and behavioral disorders: Secondary | ICD-10-CM | POA: Insufficient documentation

## 2012-11-02 DIAGNOSIS — B9789 Other viral agents as the cause of diseases classified elsewhere: Secondary | ICD-10-CM | POA: Insufficient documentation

## 2012-11-02 LAB — COMPREHENSIVE METABOLIC PANEL
AST: 12 U/L (ref 0–37)
Albumin: 3.3 g/dL — ABNORMAL LOW (ref 3.5–5.2)
Alkaline Phosphatase: 56 U/L (ref 39–117)
Chloride: 103 mEq/L (ref 96–112)
Potassium: 3.4 mEq/L — ABNORMAL LOW (ref 3.5–5.1)
Sodium: 138 mEq/L (ref 135–145)
Total Bilirubin: 0.4 mg/dL (ref 0.3–1.2)

## 2012-11-02 LAB — CBC WITH DIFFERENTIAL/PLATELET
Basophils Absolute: 0 10*3/uL (ref 0.0–0.1)
Basophils Relative: 0 % (ref 0–1)
Hemoglobin: 12.2 g/dL (ref 12.0–15.0)
MCHC: 34.1 g/dL (ref 30.0–36.0)
Neutro Abs: 6.2 10*3/uL (ref 1.7–7.7)
Neutrophils Relative %: 59 % (ref 43–77)
Platelets: 294 10*3/uL (ref 150–400)
RDW: 13.7 % (ref 11.5–15.5)

## 2012-11-02 LAB — URINE CULTURE

## 2012-11-02 LAB — URINALYSIS, ROUTINE W REFLEX MICROSCOPIC
Bilirubin Urine: NEGATIVE
Glucose, UA: NEGATIVE mg/dL
Ketones, ur: NEGATIVE mg/dL
Leukocytes, UA: NEGATIVE
Specific Gravity, Urine: 1.025 (ref 1.005–1.030)
pH: 6 (ref 5.0–8.0)

## 2012-11-02 LAB — URINE MICROSCOPIC-ADD ON

## 2012-11-02 LAB — D-DIMER, QUANTITATIVE: D-Dimer, Quant: 0.37 ug/mL-FEU (ref 0.00–0.48)

## 2012-11-02 MED ORDER — HYDROMORPHONE HCL PF 1 MG/ML IJ SOLN
0.5000 mg | Freq: Once | INTRAMUSCULAR | Status: AC
  Administered 2012-11-02: 0.5 mg via INTRAVENOUS
  Filled 2012-11-02: qty 1

## 2012-11-02 MED ORDER — SODIUM CHLORIDE 0.9 % IV BOLUS (SEPSIS)
1000.0000 mL | Freq: Once | INTRAVENOUS | Status: AC
  Administered 2012-11-02: 1000 mL via INTRAVENOUS

## 2012-11-02 MED ORDER — HYDROMORPHONE HCL PF 1 MG/ML IJ SOLN
0.5000 mg | Freq: Once | INTRAMUSCULAR | Status: AC
  Administered 2012-11-02: 0.5 mg via INTRAVENOUS

## 2012-11-02 MED ORDER — HYDROMORPHONE HCL PF 1 MG/ML IJ SOLN
INTRAMUSCULAR | Status: AC
  Filled 2012-11-02: qty 1

## 2012-11-02 NOTE — ED Notes (Signed)
Pt requesting more pain meds.  Notified edp

## 2012-11-02 NOTE — ED Notes (Signed)
Pt states was seen here on Sunday for similar symptoms. Pt states feels like she is getting worse. Neck pain, shortness of breath and states had fever last night. Last motrin 800mg  at 3am

## 2012-11-02 NOTE — ED Notes (Signed)
Pt reports pain all over her body.  Notified edp

## 2012-11-02 NOTE — ED Provider Notes (Signed)
History  This chart was scribed for Donnetta Hutching, MD by Ladona Ridgel Day. This patient was seen in room APA19/APA19 and the patient's care was started at 1306.   CSN: [id number]  Arrival date & time 11/02/12  1306   First MD Initiated Contact with Patient 11/02/12 1320      Chief Complaint  Patient presents with  . Shortness of Breath  . Neck Pain   Patient is a 31 y.o. female presenting with shortness of breath. The history is provided by the patient. No language interpreter was used.  Shortness of Breath  The current episode started 2 days ago. The problem occurs continuously. The problem has been gradually worsening. The problem is moderate. Nothing relieves the symptoms. The symptoms are aggravated by activity. Associated symptoms include chest pressure. Pertinent negatives include no fever and no shortness of breath. There was no intake of a foreign body. Recently, medical care has been given at this facility. Services received include medications given and tests performed.   Barbara Jennings is a 31 y.o. female who presents to the Emergency Department with complaints of fatigue, SOB, and neck pain. She was seen here 2 days ago for similar symptoms but states her symptoms have worsened since. She states has not been able to keep much in liquids or food down so she feels tired and dehydrated. She states pain with deep breaths and easily fatigued with exertion. She states had some liquid yesterday.   Her PCP is Dr. Phillips Odor  Past Medical History  Diagnosis Date  . Asthma   . Anxiety   . Panic attack   . Depression   . Migraine     Past Surgical History  Procedure Date  . Cesarean section   . Ovarian cyst removal   . Appendectomy   . Cholecystectomy     No family history on file.  History  Substance Use Topics  . Smoking status: Current Every Day Smoker -- 0.5 packs/day    Types: Cigarettes  . Smokeless tobacco: Not on file  . Alcohol Use: No    OB History    Grav Para Term  Preterm Abortions TAB SAB Ect Mult Living                  Review of Systems  Constitutional: Negative for fever and chills.  Respiratory: Negative for shortness of breath.   Gastrointestinal: Negative for nausea and vomiting.  Neurological: Negative for weakness.  All other systems reviewed and are negative.  A complete 10 system review of systems was obtained and all systems are negative except as noted in the HPI and PMH.    Allergies  Coconut flavor  Home Medications   Current Outpatient Rx  Name  Route  Sig  Dispense  Refill  . IBUPROFEN 800 MG PO TABS   Oral   Take 800 mg by mouth 3 (three) times daily. Pain         . ETONOGESTREL 68 MG Young IMPL   Subcutaneous   Inject 1 each into the skin once.             Triage Vitals: BP 120/71  Pulse 72  Temp 98.3 F (36.8 C) (Oral)  Resp 18  Ht 5\' 6"  (1.676 m)  Wt 238 lb (107.956 kg)  BMI 38.41 kg/m2  SpO2 97%  Physical Exam  Nursing note and vitals reviewed. Constitutional: She is oriented to person, place, and time. She appears well-developed and well-nourished.  HENT:  Head: Normocephalic  and atraumatic.  Eyes: Conjunctivae normal and EOM are normal. Pupils are equal, round, and reactive to light.  Neck: Normal range of motion. Neck supple.       No meningeal signs, non tender neck  Cardiovascular: Normal rate, regular rhythm and normal heart sounds.   Pulmonary/Chest: Effort normal and breath sounds normal.  Abdominal: Soft. Bowel sounds are normal.  Musculoskeletal: Normal range of motion. She exhibits tenderness (minimal paraspinus tenderness to palpation).  Neurological: She is alert and oriented to person, place, and time.  Skin: Skin is warm and dry.  Psychiatric: She has a normal mood and affect.    ED Course  Procedures (including critical care time) DIAGNOSTIC STUDIES: Oxygen Saturation is 97% on room air, normal by my interpretation.    COORDINATION OF CARE: At 130 PM Discussed treatment  plan with patient which includes IV fluids, CXR, D-dimer, blood work, UA. Patient agrees.   Labs Reviewed  CBC WITH DIFFERENTIAL - Abnormal; Notable for the following:    WBC 10.6 (*)     HCT 35.8 (*)     All other components within normal limits  D-DIMER, QUANTITATIVE  COMPREHENSIVE METABOLIC PANEL  LIPASE, BLOOD  URINALYSIS, ROUTINE W REFLEX MICROSCOPIC  PREGNANCY, URINE   Dg Chest 2 View  10/31/2012  *RADIOLOGY REPORT*  Clinical Data: Cough.  Fever.  Weakness.  Nausea and vomiting. Headache.  2-day history of these symptoms.  CHEST - 2 VIEW  Comparison: One-view chest x-ray 12/30/2011.  Two-view chest x-ray 01/22/2009, 09/20/2008, 05/18/2008.  Findings: Cardiac silhouette upper normal in size to slightly enlarged but stable.  Hilar and mediastinal contours otherwise unremarkable.  Suboptimal inspiration due to body habitus accounts for crowded bronchovascular markings at the bases; taking this into account, lungs clear.  No pleural effusions.  Degenerative changes involving the thoracic spine.  No significant interval change.  IMPRESSION: Suboptimal inspiration.  Stable borderline to mild cardiomegaly. No acute cardiopulmonary disease.   Original Report Authenticated By: Hulan Saas, M.D.      No diagnosis found.   Date: 11/02/2012  Rate: 50  Rhythm: sinus bradycardia  QRS Axis: normal  Intervals: normal  ST/T Wave abnormalities: inverted T wavws ant [old]  Conduction Disutrbances:  none  Narrative Interpretation:   Old EKG Reviewed: changes noted  MDM   No clinical evidence of meningitis. Cervical spine is nontender posteriorly.  Patient does not appear toxic. Feels  better after IV fluids.     I personally performed the services described in this documentation, which was scribed in my presence. The recorded information has been reviewed and considered.          Donnetta Hutching, MD 11/02/12 [phone]

## 2013-03-12 ENCOUNTER — Encounter (HOSPITAL_COMMUNITY): Payer: Self-pay | Admitting: *Deleted

## 2013-03-12 ENCOUNTER — Emergency Department (HOSPITAL_COMMUNITY)
Admission: EM | Admit: 2013-03-12 | Discharge: 2013-03-13 | Disposition: A | Discharge status: Home / Self Care | Payer: BC Managed Care – PPO | Attending: Emergency Medicine | Admitting: Emergency Medicine

## 2013-03-12 DIAGNOSIS — R112 Nausea with vomiting, unspecified: Secondary | ICD-10-CM | POA: Insufficient documentation

## 2013-03-12 DIAGNOSIS — F17200 Nicotine dependence, unspecified, uncomplicated: Secondary | ICD-10-CM | POA: Insufficient documentation

## 2013-03-12 DIAGNOSIS — H53149 Visual discomfort, unspecified: Secondary | ICD-10-CM | POA: Insufficient documentation

## 2013-03-12 DIAGNOSIS — G8929 Other chronic pain: Secondary | ICD-10-CM | POA: Insufficient documentation

## 2013-03-12 DIAGNOSIS — Z8659 Personal history of other mental and behavioral disorders: Secondary | ICD-10-CM | POA: Insufficient documentation

## 2013-03-12 DIAGNOSIS — G43909 Migraine, unspecified, not intractable, without status migrainosus: Secondary | ICD-10-CM | POA: Insufficient documentation

## 2013-03-12 DIAGNOSIS — J45909 Unspecified asthma, uncomplicated: Secondary | ICD-10-CM | POA: Insufficient documentation

## 2013-03-12 MED ORDER — SODIUM CHLORIDE 0.9 % IV SOLN
1000.0000 mL | INTRAVENOUS | Status: DC
  Administered 2013-03-12: 1000 mL via INTRAVENOUS

## 2013-03-12 MED ORDER — HYDROMORPHONE HCL PF 2 MG/ML IJ SOLN
2.0000 mg | Freq: Once | INTRAMUSCULAR | Status: AC
  Administered 2013-03-13: 2 mg via INTRAVENOUS
  Filled 2013-03-12: qty 1

## 2013-03-12 MED ORDER — DIPHENHYDRAMINE HCL 50 MG/ML IJ SOLN
25.0000 mg | Freq: Once | INTRAMUSCULAR | Status: AC
  Administered 2013-03-12: 25 mg via INTRAVENOUS
  Filled 2013-03-12: qty 1

## 2013-03-12 MED ORDER — SODIUM CHLORIDE 0.9 % IV SOLN
1000.0000 mL | Freq: Once | INTRAVENOUS | Status: AC
  Administered 2013-03-12: 1000 mL via INTRAVENOUS

## 2013-03-12 MED ORDER — METOCLOPRAMIDE HCL 5 MG/ML IJ SOLN
10.0000 mg | Freq: Once | INTRAMUSCULAR | Status: AC
  Administered 2013-03-12: 10 mg via INTRAVENOUS
  Filled 2013-03-12: qty 2

## 2013-03-12 MED ORDER — SODIUM CHLORIDE 0.9 % IV SOLN
INTRAVENOUS | Status: DC
  Administered 2013-03-12: 22:00:00 via INTRAVENOUS

## 2013-03-12 MED ORDER — DEXAMETHASONE SODIUM PHOSPHATE 4 MG/ML IJ SOLN
10.0000 mg | Freq: Once | INTRAMUSCULAR | Status: AC
  Administered 2013-03-12: 10 mg via INTRAVENOUS
  Filled 2013-03-12: qty 3

## 2013-03-12 MED ORDER — ONDANSETRON HCL 4 MG/2ML IJ SOLN
4.0000 mg | Freq: Once | INTRAMUSCULAR | Status: AC
  Administered 2013-03-13: 4 mg via INTRAVENOUS
  Filled 2013-03-12: qty 2

## 2013-03-12 NOTE — ED Provider Notes (Signed)
History  This chart was scribed for Osvaldo Human, MD by Bennett Scrape, ED Scribe. This patient was seen in room APA11/APA11 and the patient's care was started at 9:54 PM.  CSN: [id number]  Arrival date & time 03/12/13  1940   First MD Initiated Contact with Patient 03/12/13 2154      Chief Complaint  Patient presents with  . Headache     Patient is a 32 y.o. female presenting with headaches. The history is provided by the patient. No language interpreter was used.  Headache Radiates to:  Does not radiate Severity currently:  10/10 Severity at highest:  10/10 Onset quality:  Gradual Duration:  3 days Timing:  Constant Progression:  Waxing and waning Chronicity:  Chronic Similar to prior headaches: yes   Worsened by:  Light Associated symptoms: nausea and vomiting   Associated symptoms: no abdominal pain, no cough, no diarrhea, no ear pain, no fever, no seizures and no sore throat     Barbara Jennings is a 32 y.o. female with a h/o migraines since the age of 84 who presents to the Emergency Department complaining of 3 days of HA described as a migraine with associated photophobia, nausea and emesis. She reports taking 3 days worth of Imitrex injections with mild temporary improvement. She denies that Imitrex has worked in the past. She reports that her aunt has migraines as well. She denies fever, cough, SOB and CP as associated symptoms. She is currently on Implanon birth control but denies any changes in the migraines. She reports that the migraines resolved during her pregnancy but returned shortly after she gave birth. She has a h/o depression and asthma as well. She is a 0.25 ppd smoker and a social alcohol user.    Past Medical History  Diagnosis Date  . Asthma   . Anxiety   . Panic attack   . Depression   . Migraine     Past Surgical History  Procedure Laterality Date  . Cesarean section    . Ovarian cyst removal    . Appendectomy    . Cholecystectomy       History reviewed. No pertinent family history.  History  Substance Use Topics  . Smoking status: Current Every Day Smoker -- 0.50 packs/day    Types: Cigarettes  . Smokeless tobacco: Not on file  . Alcohol Use: Yes     Comment: socially    No OB history provided.  Review of Systems  Constitutional: Negative for fever.  HENT: Negative for ear pain and sore throat.   Respiratory: Negative for cough and shortness of breath.   Cardiovascular: Negative for chest pain.  Gastrointestinal: Positive for nausea and vomiting. Negative for abdominal pain and diarrhea.  Genitourinary: Negative for dysuria and frequency.  Skin: Negative for rash.  Neurological: Positive for headaches. Negative for seizures and syncope.  All other systems reviewed and are negative.    Allergies  Coconut flavor-confirmed by pt at bedside  Home Medications   Current Outpatient Rx  Name  Route  Sig  Dispense  Refill  . SUMAtriptan (IMITREX) 6 MG/0.5ML SOLN injection   Subcutaneous   Inject 6 mg into the skin every 2 (two) hours as needed for migraine or headache. F         . etonogestrel (IMPLANON) 68 MG IMPL implant   Subcutaneous   Inject 1 each into the skin once.             Triage Vitals: BP 127/67  Pulse 99  Temp(Src) 97.4 F (36.3 C) (Oral)  Resp 18  Ht 5\' 6"  (1.676 m)  Wt 248 lb (112.492 kg)  BMI 40.05 kg/m2  SpO2 100%  Physical Exam  Nursing note and vitals reviewed. Constitutional: She is oriented to person, place, and time. She appears well-developed and well-nourished. No distress.  HENT:  Head: Normocephalic and atraumatic.  Mouth/Throat: Oropharynx is clear and moist.  Eyes: Conjunctivae and EOM are normal.  She is intensely photophobic   Neck: Neck supple. No tracheal deviation present.  Cardiovascular: Normal rate and regular rhythm.  Exam reveals no gallop and no friction rub.   No murmur heard. Pulmonary/Chest: Effort normal and breath sounds normal. No  respiratory distress.  Abdominal: Soft. There is no tenderness.  Musculoskeletal: Normal range of motion. She exhibits no edema.  Neurological: She is alert and oriented to person, place, and time.  Neurologically intact  Skin: Skin is warm and dry.  Psychiatric: She has a normal mood and affect. Her behavior is normal.    ED Course  Procedures (including critical care time)  DIAGNOSTIC STUDIES: Oxygen Saturation is 100% on room air, nroam by my interpretation.    COORDINATION OF CARE: 10:16 PM-Discussed treatment plan which includes migraine cocktail with pt at bedside and pt agreed to plan.   10:30 PM-Ordered 10 mg Decadron injection, 10 mg Reglan injection and 25 mg Benadryl injection.  11:21 PM No further vomiting, but headache is still pounding.  Will give rescue medication of Dilaudid 2 mg IV and Zofran 4 mg IV.       1. Migraine headache    I personally performed the services described in this documentation, which was scribed in my presence. The recorded information has been reviewed and is accurate.  Osvaldo Human, MD      Carleene Cooper III, MD 03/13/13 [phone]

## 2013-03-12 NOTE — ED Notes (Signed)
Pt with migraine HA with N/V x 3 days

## 2013-03-13 ENCOUNTER — Encounter (HOSPITAL_COMMUNITY): Payer: Self-pay | Admitting: *Deleted

## 2013-03-13 ENCOUNTER — Emergency Department (HOSPITAL_COMMUNITY)
Admission: EM | Admit: 2013-03-13 | Discharge: 2013-03-13 | Disposition: A | Discharge status: Home / Self Care | Payer: BC Managed Care – PPO | Attending: Emergency Medicine | Admitting: Emergency Medicine

## 2013-03-13 DIAGNOSIS — F17200 Nicotine dependence, unspecified, uncomplicated: Secondary | ICD-10-CM | POA: Insufficient documentation

## 2013-03-13 DIAGNOSIS — Z8659 Personal history of other mental and behavioral disorders: Secondary | ICD-10-CM | POA: Insufficient documentation

## 2013-03-13 DIAGNOSIS — Z79899 Other long term (current) drug therapy: Secondary | ICD-10-CM | POA: Insufficient documentation

## 2013-03-13 DIAGNOSIS — R112 Nausea with vomiting, unspecified: Secondary | ICD-10-CM | POA: Insufficient documentation

## 2013-03-13 DIAGNOSIS — J45909 Unspecified asthma, uncomplicated: Secondary | ICD-10-CM | POA: Insufficient documentation

## 2013-03-13 DIAGNOSIS — G43909 Migraine, unspecified, not intractable, without status migrainosus: Secondary | ICD-10-CM

## 2013-03-13 DIAGNOSIS — R109 Unspecified abdominal pain: Secondary | ICD-10-CM | POA: Insufficient documentation

## 2013-03-13 MED ORDER — HYDROMORPHONE HCL PF 2 MG/ML IJ SOLN
INTRAMUSCULAR | Status: AC
  Filled 2013-03-13: qty 1

## 2013-03-13 MED ORDER — NAPROXEN 500 MG PO TABS: 500.0000 mg | ORAL_TABLET | Freq: Two times a day (BID) | ORAL | Status: DC

## 2013-03-13 MED ORDER — OXYCODONE-ACETAMINOPHEN 5-325 MG PO TABS: 1.0000 | ORAL_TABLET | ORAL | Status: DC | PRN

## 2013-03-13 MED ORDER — VALPROATE SODIUM 500 MG/5ML IV SOLN
500.0000 mg | Freq: Once | INTRAVENOUS | Status: AC
  Administered 2013-03-13: 500 mg via INTRAVENOUS
  Filled 2013-03-13: qty 5

## 2013-03-13 MED ORDER — VALPROATE SODIUM 500 MG/5ML IV SOLN
INTRAVENOUS | Status: AC
  Filled 2013-03-13: qty 5

## 2013-03-13 MED ORDER — OXYCODONE-ACETAMINOPHEN 5-325 MG PO TABS: 2.0000 | ORAL_TABLET | ORAL | Status: DC | PRN

## 2013-03-13 MED ORDER — ONDANSETRON HCL 4 MG/2ML IJ SOLN
4.0000 mg | Freq: Once | INTRAMUSCULAR | Status: AC
  Administered 2013-03-13: 4 mg via INTRAVENOUS
  Filled 2013-03-13: qty 2

## 2013-03-13 MED ORDER — SODIUM CHLORIDE 0.9 % IV BOLUS (SEPSIS)
1000.0000 mL | Freq: Once | INTRAVENOUS | Status: AC
  Administered 2013-03-13: 1000 mL via INTRAVENOUS

## 2013-03-13 MED ORDER — HYDROMORPHONE HCL PF 2 MG/ML IJ SOLN
2.0000 mg | Freq: Once | INTRAMUSCULAR | Status: AC
  Administered 2013-03-13: 2 mg via INTRAVENOUS

## 2013-03-13 MED ORDER — HYDROMORPHONE HCL PF 2 MG/ML IJ SOLN
2.0000 mg | Freq: Once | INTRAMUSCULAR | Status: AC
  Administered 2013-03-13: 2 mg via INTRAVENOUS
  Filled 2013-03-13: qty 1

## 2013-03-13 NOTE — ED Notes (Signed)
Discharge instructions reviewed with pt, questions answered. Pt verbalized understanding.  

## 2013-03-13 NOTE — ED Notes (Signed)
Pt states meds from last night helped but HA comes back worse than before and same thing happened today after meds wore off from today as well per pt., sensitive to light, pt ambulated wearing sunglasses to room

## 2013-03-13 NOTE — ED Notes (Signed)
Pt given work note, refused wheelchair.

## 2013-03-13 NOTE — ED Provider Notes (Signed)
History  This chart was scribed for Barbara Roller, MD by Bennett Scrape, ED Scribe. This patient was seen in room [facility]14/[facility]14 and the patient's care was started at 10:14 PM.  CSN: [id number]  Arrival date & time 03/13/13  1958   First MD Initiated Contact with Patient 03/13/13 2214      Chief Complaint  Patient presents with  . Migraine    The history is provided by the patient. No language interpreter was used.    Barbara Jennings is a 32 y.o. female with a h/o migraines who presents to the Emergency Department complaining of 4 days of gradual onset, waxing and waning, constant light-sided HA described as pounding with associated photophobia, nausea and emesis. Pt was seen in the ED last night for the same with no improvement. She was seen today by her PCP today and was given Toradol and phenergan medication with no improvement. She states that this HA is more severe than her normal migraines due to the location being specific. She states that usually her migraines are generalized. She denies having any prior successful therapies at home. She is on Imitrex injections at home but states that it only helps when she takes it with having auras. She reports taking ibuprofen yesterday with no improvement. She reports that the Dilaudid helps with the pain and phenergan helps with the nausea when she is seen in the ED.  She denies sinus drainage, fever and stiff neck as associated symptoms. She also has a h/o asthma, anxiety and depression. She is a current everyday smoker and occasional alcohol user.  Past Medical History  Diagnosis Date  . Asthma   . Anxiety   . Panic attack   . Depression   . Migraine     Past Surgical History  Procedure Laterality Date  . Cesarean section    . Ovarian cyst removal    . Appendectomy    . Cholecystectomy      History reviewed. No pertinent family history.  History  Substance Use Topics  . Smoking status: Current Every Day Smoker -- 0.50 packs/day     Types: Cigarettes  . Smokeless tobacco: Not on file  . Alcohol Use: Yes     Comment: socially    No OB history provided.  Review of Systems  Constitutional: Negative for fever and chills.  HENT: Negative for neck stiffness.   Eyes: Positive for photophobia.  Gastrointestinal: Positive for nausea and vomiting. Negative for abdominal pain.  Neurological: Positive for headaches.  All other systems reviewed and are negative.    Allergies  Coconut flavor  Home Medications   Current Outpatient Rx  Name  Route  Sig  Dispense  Refill  . SUMAtriptan (IMITREX) 6 MG/0.5ML SOLN injection   Subcutaneous   Inject 6 mg into the skin every 2 (two) hours as needed for migraine or headache. F         . etonogestrel (IMPLANON) 68 MG IMPL implant   Subcutaneous   Inject 1 each into the skin once.           . naproxen (NAPROSYN) 500 MG tablet   Oral   Take 1 tablet (500 mg total) by mouth 2 (two) times daily with a meal.   30 tablet   0   . oxyCODONE-acetaminophen (PERCOCET) 5-325 MG per tablet   Oral   Take 1 tablet by mouth every 4 (four) hours as needed for pain.   20 tablet   0  Triage Vitals: BP 147/80  Pulse 87  Temp(Src) 98.3 F (36.8 C) (Oral)  Resp 20  Ht 5\' 6"  (1.676 m)  Wt 247 lb (112.038 kg)  BMI 39.89 kg/m2  SpO2 100%  Physical Exam  Nursing note and vitals reviewed. Constitutional: She is oriented to person, place, and time. She appears well-developed and well-nourished. No distress.  HENT:  Head: Normocephalic and atraumatic.  Mouth/Throat: Oropharynx is clear and moist.  Oropharynx is clear, no exudate asymmetry hypertrophy or erythema, nasal passages are clear, tympanic membranes are clear, no tenderness over the sinuses bilaterally  Eyes: Conjunctivae and EOM are normal. Pupils are equal, round, and reactive to light.  Neck: Neck supple. No tracheal deviation present.  Very supple neck, no lymphadenopathy  Cardiovascular: Normal rate and  regular rhythm.  Exam reveals no gallop and no friction rub.   No murmur heard. Pulmonary/Chest: Effort normal and breath sounds normal. No respiratory distress.  Abdominal: Soft. There is tenderness.  Musculoskeletal: Normal range of motion. She exhibits no edema.  Neurological: She is alert and oriented to person, place, and time.  The patient has clear speech, calculated movements, no ataxia, no limb or truncal ataxia, clear speech, normal strength of all 4 extremities, normal pupillary exam is normal extraocular movements bilaterally.  Skin: Skin is warm and dry.  Psychiatric: She has a normal mood and affect. Her behavior is normal.    ED Course  Procedures (including critical care time)  DIAGNOSTIC STUDIES: Oxygen Saturation is 100% on room air, normal by my interpretation.    COORDINATION OF CARE: 10:20 PM-Discussed treatment plan which includes dilaudid, Depacon and bolus with pt at bedside and pt agreed to plan.   10:30 PM- Ordered 4 mg Zofran injection, 1,000 mL of bolus, Depacon IVPB and 2mg  Dilaudid injection  Labs Reviewed - No data to display No results found.   1. Migraine headache       MDM  The patient has a very benign presentation despite stating that her headache is severe she has no focal neurologic deficits, no stiff neck, no fever, speaking in full sentences and is alert and oriented. After getting 500 mg of Depakote and 1 mg of Dilaudid she states that she had temporary relief but then it returned. I discussed with her the other treatment options including other medications, she has requested one more dose of Dilaudid and will go home with a prescription for Percocet. Though I agree that this is suboptimal care, she has had both medications last night, medications this morning at her doctor's office and again this evening. I am somewhat concerned about adding additional different medications to this list and at this time because I do not think this is a  secondary headache she should be stable to go home and followup with neurology. Phone numbers given, patient stable and understands the discharge instructions and the indications for return.   I personally performed the services described in this documentation, which was scribed in my presence. The recorded information has been reviewed and is accurate.          Barbara Roller, MD 03/13/13 [phone]

## 2013-03-13 NOTE — ED Notes (Signed)
Pt's ride here to transport pt home, seen walking out with another individual

## 2013-03-13 NOTE — ED Notes (Addendum)
Pt has had a migraine x 4 days. Pt was seen here last night for the same, went to PCP this morning and was given Toradol and phenergan

## 2013-03-19 ENCOUNTER — Emergency Department (HOSPITAL_COMMUNITY)
Admission: EM | Admit: 2013-03-19 | Discharge: 2013-03-19 | Disposition: A | Discharge status: Home / Self Care | Payer: BC Managed Care – PPO | Attending: Emergency Medicine | Admitting: Emergency Medicine

## 2013-03-19 ENCOUNTER — Encounter (HOSPITAL_COMMUNITY): Payer: Self-pay | Admitting: Emergency Medicine

## 2013-03-19 DIAGNOSIS — J45909 Unspecified asthma, uncomplicated: Secondary | ICD-10-CM | POA: Insufficient documentation

## 2013-03-19 DIAGNOSIS — B029 Zoster without complications: Secondary | ICD-10-CM | POA: Insufficient documentation

## 2013-03-19 DIAGNOSIS — G43909 Migraine, unspecified, not intractable, without status migrainosus: Secondary | ICD-10-CM | POA: Insufficient documentation

## 2013-03-19 DIAGNOSIS — F17200 Nicotine dependence, unspecified, uncomplicated: Secondary | ICD-10-CM | POA: Insufficient documentation

## 2013-03-19 DIAGNOSIS — Z79899 Other long term (current) drug therapy: Secondary | ICD-10-CM | POA: Insufficient documentation

## 2013-03-19 DIAGNOSIS — Z8659 Personal history of other mental and behavioral disorders: Secondary | ICD-10-CM | POA: Insufficient documentation

## 2013-03-19 MED ORDER — OXYCODONE-ACETAMINOPHEN 5-325 MG PO TABS: 1.0000 | ORAL_TABLET | ORAL | Status: DC | PRN

## 2013-03-19 MED ORDER — VALACYCLOVIR HCL 1 G PO TABS: 1000.0000 mg | ORAL_TABLET | Freq: Three times a day (TID) | ORAL | Status: AC

## 2013-03-19 NOTE — ED Notes (Signed)
Patient reports tingling under arms bilaterally on Monday night and when she woke on Tuesday had rash. Patient reports burning and tingling, with extreme pain with touch.

## 2013-03-20 NOTE — ED Provider Notes (Signed)
History     CSN: [id number]  Arrival date & time 03/19/13  1507   First MD Initiated Contact with Patient 03/19/13 1600      Chief Complaint  Patient presents with  . Rash    (Consider location/radiation/quality/duration/timing/severity/associated sxs/prior treatment) HPI Comments: Barbara Jennings is a 32 y.o. Female with now a 5 day history of painful rash in her bilateral axilla.  She describes burning and sharp pain which started the  Left axilla first 6 days ago, then within 24 hours has broken out in rash.  She denies fevers or chills and there has been no drainage from the lesions.  She denies itching,  No other family members have a similar rash and she has had no new exposures to medicines, skin products,  Detergents or soaps,  except for a dose of depacon she was given 7 days ago here for migraine relief (headach is now better).  She denies prior similar symptoms.  She has taken naproxen with no relief of her pain.       The history is provided by the patient.    Past Medical History  Diagnosis Date  . Asthma   . Anxiety   . Panic attack   . Depression   . Migraine     Past Surgical History  Procedure Laterality Date  . Cesarean section    . Ovarian cyst removal    . Appendectomy    . Cholecystectomy      Family History  Problem Relation Age of Onset  . Diabetes Other   . Cancer Other   . Heart failure Other     History  Substance Use Topics  . Smoking status: Current Every Day Smoker -- 0.50 packs/day for 12 years    Types: Cigarettes  . Smokeless tobacco: Never Used  . Alcohol Use: Yes     Comment: socially    OB History   Grav Para Term Preterm Abortions TAB SAB Ect Mult Living   7 2 1 1 5  5   1       Review of Systems  Constitutional: Negative for fever and chills.  HENT: Negative for facial swelling.   Respiratory: Negative for shortness of breath and wheezing.   Gastrointestinal: Negative for nausea.  Skin: Positive for rash.   Neurological: Negative for numbness.    Allergies  Coconut flavor  Home Medications   Current Outpatient Rx  Name  Route  Sig  Dispense  Refill  . etonogestrel (IMPLANON) 68 MG IMPL implant   Subcutaneous   Inject 1 each into the skin once.           . naproxen (NAPROSYN) 500 MG tablet   Oral   Take 1 tablet (500 mg total) by mouth 2 (two) times daily with a meal.   30 tablet   0   . oxyCODONE-acetaminophen (PERCOCET/ROXICET) 5-325 MG per tablet   Oral   Take 2 tablets by mouth every 4 (four) hours as needed for pain.   20 tablet   0   . oxyCODONE-acetaminophen (ROXICET) 5-325 MG per tablet   Oral   Take 1 tablet by mouth every 4 (four) hours as needed for pain.   30 tablet   0   . SUMAtriptan (IMITREX) 6 MG/0.5ML SOLN injection   Subcutaneous   Inject 6 mg into the skin every 2 (two) hours as needed for migraine or headache. F         . valACYclovir (VALTREX) 1000 MG  tablet   Oral   Take 1 tablet (1,000 mg total) by mouth 3 (three) times daily.   21 tablet   0     BP 109/65  Pulse 88  Temp(Src) 98.1 F (36.7 C) (Oral)  Resp 18  Ht 5\' 6"  (1.676 m)  Wt 243 lb (110.224 kg)  BMI 39.24 kg/m2  SpO2 100%  Physical Exam  Constitutional: She appears well-developed and well-nourished. No distress.  HENT:  Head: Normocephalic.  Neck: Neck supple.  Cardiovascular: Normal rate.   Pulmonary/Chest: Effort normal. She has no wheezes.  Musculoskeletal: Normal range of motion. She exhibits no edema.  Skin: Rash noted. Rash is papular.  Bilateral distribution of erythematous papules with no surrounding erythema.  No pustules or drainage.  Distribution in right axilla somewhat linear,  Left axilla more c shaped around the posterior edge of the axilla,  But also with lesions on her lateral breast.    ED Course  Procedures (including critical care time)  Labs Reviewed - No data to display No results found.   1. Shingles outbreak       MDM  Pt was also  seen by Dr. Adriana Simas who agrees with tx for possible shingles.  Pain and burning c/w this diagnosis,  Distribution is atypical,  Esp since bilateral,  Does not follow dermatomal distribution in the left axilla,  But does in the right.  Pt prescribed valtrax,  Oxycodone.  Exposure precautions given, work note given.  Pt to f/u with her pcp in 2 days for a recheck.        Burgess Amor, PA-C 03/20/13 1311

## 2013-03-22 NOTE — ED Provider Notes (Signed)
Medical screening examination/treatment/procedure(s) were conducted as a shared visit with non-physician practitioner(s) and myself.  I personally evaluated the patient during the encounter.  Unusual presentation, but looks like shingles  Donnetta Hutching, MD 03/22/13 1046

## 2013-05-06 ENCOUNTER — Other Ambulatory Visit (HOSPITAL_COMMUNITY): Payer: Self-pay | Admitting: Family Medicine

## 2013-05-06 DIAGNOSIS — R221 Localized swelling, mass and lump, neck: Secondary | ICD-10-CM

## 2013-05-10 ENCOUNTER — Other Ambulatory Visit (HOSPITAL_COMMUNITY): Payer: BC Managed Care – PPO

## 2013-05-11 ENCOUNTER — Ambulatory Visit (HOSPITAL_COMMUNITY)
Admission: RE | Admit: 2013-05-11 | Discharge: 2013-05-11 | Disposition: A | Discharge status: Home / Self Care | Payer: BC Managed Care – PPO | Source: Ambulatory Visit | Attending: Family Medicine | Admitting: Family Medicine

## 2013-05-11 DIAGNOSIS — R221 Localized swelling, mass and lump, neck: Secondary | ICD-10-CM

## 2013-05-11 DIAGNOSIS — R220 Localized swelling, mass and lump, head: Secondary | ICD-10-CM | POA: Insufficient documentation

## 2013-05-26 ENCOUNTER — Encounter: Payer: Self-pay | Admitting: *Deleted

## 2013-05-26 DIAGNOSIS — B009 Herpesviral infection, unspecified: Secondary | ICD-10-CM | POA: Insufficient documentation

## 2013-05-27 ENCOUNTER — Other Ambulatory Visit: Payer: Self-pay | Admitting: Adult Health

## 2013-06-18 ENCOUNTER — Encounter (HOSPITAL_COMMUNITY): Payer: Self-pay | Admitting: *Deleted

## 2013-06-18 ENCOUNTER — Emergency Department (HOSPITAL_COMMUNITY)
Admission: EM | Admit: 2013-06-18 | Discharge: 2013-06-19 | Disposition: A | Discharge status: Home / Self Care | Payer: BC Managed Care – PPO | Attending: Emergency Medicine | Admitting: Emergency Medicine

## 2013-06-18 DIAGNOSIS — Z8619 Personal history of other infectious and parasitic diseases: Secondary | ICD-10-CM | POA: Insufficient documentation

## 2013-06-18 DIAGNOSIS — J45909 Unspecified asthma, uncomplicated: Secondary | ICD-10-CM | POA: Insufficient documentation

## 2013-06-18 DIAGNOSIS — H5789 Other specified disorders of eye and adnexa: Secondary | ICD-10-CM | POA: Insufficient documentation

## 2013-06-18 DIAGNOSIS — G43909 Migraine, unspecified, not intractable, without status migrainosus: Secondary | ICD-10-CM | POA: Insufficient documentation

## 2013-06-18 DIAGNOSIS — H53149 Visual discomfort, unspecified: Secondary | ICD-10-CM | POA: Insufficient documentation

## 2013-06-18 DIAGNOSIS — Z8659 Personal history of other mental and behavioral disorders: Secondary | ICD-10-CM | POA: Insufficient documentation

## 2013-06-18 DIAGNOSIS — H5712 Ocular pain, left eye: Secondary | ICD-10-CM

## 2013-06-18 DIAGNOSIS — H5710 Ocular pain, unspecified eye: Secondary | ICD-10-CM | POA: Insufficient documentation

## 2013-06-18 DIAGNOSIS — F17200 Nicotine dependence, unspecified, uncomplicated: Secondary | ICD-10-CM | POA: Insufficient documentation

## 2013-06-18 NOTE — ED Notes (Signed)
Pt reporting left eye irritation began about noon today, has gotten progressively worse through day.  Minimal relief from flushing eye.

## 2013-06-19 MED ORDER — TETRACAINE HCL 0.5 % OP SOLN
OPHTHALMIC | Status: AC
  Administered 2013-06-19: 01:00:00
  Filled 2013-06-19: qty 2

## 2013-06-19 MED ORDER — KETOROLAC TROMETHAMINE 0.5 % OP SOLN
1.0000 [drp] | Freq: Once | OPHTHALMIC | Status: AC
  Administered 2013-06-19: 1 [drp] via OPHTHALMIC
  Filled 2013-06-19: qty 5

## 2013-06-19 MED ORDER — FLUORESCEIN SODIUM 1 MG OP STRP
ORAL_STRIP | OPHTHALMIC | Status: AC
  Administered 2013-06-19: 01:00:00
  Filled 2013-06-19: qty 1

## 2013-06-19 MED ORDER — TOBRAMYCIN 0.3 % OP SOLN
1.0000 [drp] | Freq: Once | OPHTHALMIC | Status: AC
  Administered 2013-06-19: 1 [drp] via OPHTHALMIC
  Filled 2013-06-19: qty 5

## 2013-06-19 MED ORDER — CYCLOPENTOLATE HCL 1 % OP SOLN
1.0000 [drp] | Freq: Once | OPHTHALMIC | Status: AC
  Administered 2013-06-19: 1 [drp] via OPHTHALMIC
  Filled 2013-06-19: qty 2

## 2013-06-19 MED ORDER — PREDNISONE 20 MG PO TABS: ORAL_TABLET | ORAL | Status: DC

## 2013-06-19 NOTE — ED Provider Notes (Signed)
History     CSN: [id number]  Arrival date & time 06/18/13  2303   First MD Initiated Contact with Patient 06/19/13 0008      Chief Complaint  Patient presents with  . Eye Pain    (Consider location/radiation/quality/duration/timing/severity/associated sxs/prior treatment) HPI Comments: Barbara Jennings is a 32 y.o. female who presents to the Emergency Department complaining of left eye pain that has worsened throughout the day.  Thought she got something in her eye.  Rinsed her eye with water.  Also reports excessive tearing, redness to her eye and photophobia.  Has contacts but has not worn them in a week.  Denies headache, facial weakness, vision loss or vomiting  Patient is a 32 y.o. female presenting with eye pain. The history is provided by the patient.  Eye Pain This is a new problem. The current episode started today. The problem occurs constantly. The problem has been unchanged. Associated symptoms include a visual change. Pertinent negatives include no chest pain, fever, headaches, nausea, neck pain, numbness, rash, sore throat, swollen glands, vertigo, vomiting or weakness. Exacerbated by: bright light. Treatments tried: rinsing her eye. The treatment provided no relief.    Past Medical History  Diagnosis Date  . Asthma   . Anxiety   . Panic attack   . Depression   . Migraine   . Abnormal Pap smear   . HSV-2 (herpes simplex virus 2) infection     Past Surgical History  Procedure Laterality Date  . Cesarean section    . Ovarian cyst removal    . Appendectomy    . Cholecystectomy    . Dilation and curettage of uterus      Family History  Problem Relation Age of Onset  . Diabetes Other   . Cancer Other     breast  . Heart failure Other     History  Substance Use Topics  . Smoking status: Current Every Day Smoker -- 0.50 packs/day for 12 years    Types: Cigarettes  . Smokeless tobacco: Never Used  . Alcohol Use: Yes     Comment: socially    OB History   Grav Para Term Preterm Abortions TAB SAB Ect Mult Living   7 2 1 1 5  5   1       Review of Systems  Constitutional: Negative for fever, activity change and appetite change.  HENT: Negative for sore throat, facial swelling, trouble swallowing, neck pain and neck stiffness.   Eyes: Positive for photophobia, pain, redness and visual disturbance. Negative for discharge.  Respiratory: Negative for shortness of breath.   Cardiovascular: Negative for chest pain.  Gastrointestinal: Negative for nausea and vomiting.  Skin: Negative for rash and wound.  Neurological: Negative for dizziness, vertigo, facial asymmetry, speech difficulty, weakness, numbness and headaches.  Psychiatric/Behavioral: Negative for confusion and decreased concentration.  All other systems reviewed and are negative.    Allergies  Coconut flavor; Codeine; and Vicodin  Home Medications   Current Outpatient Rx  Name  Route  Sig  Dispense  Refill  . etonogestrel (IMPLANON) 68 MG IMPL implant   Subcutaneous   Inject 1 each into the skin once.           . naproxen (NAPROSYN) 500 MG tablet   Oral   Take 1 tablet (500 mg total) by mouth 2 (two) times daily with a meal.   30 tablet   0   . oxyCODONE-acetaminophen (PERCOCET/ROXICET) 5-325 MG per tablet   Oral  Take 2 tablets by mouth every 4 (four) hours as needed for pain.   20 tablet   0   . oxyCODONE-acetaminophen (ROXICET) 5-325 MG per tablet   Oral   Take 1 tablet by mouth every 4 (four) hours as needed for pain.   30 tablet   0   . SUMAtriptan (IMITREX) 6 MG/0.5ML SOLN injection   Subcutaneous   Inject 6 mg into the skin every 2 (two) hours as needed for migraine or headache. F           BP 136/80  Pulse 85  Temp(Src) 98.9 F (37.2 C) (Oral)  Resp 18  Ht 5\' 6"  (1.676 m)  Wt 273 lb (123.832 kg)  BMI 44.08 kg/m2  SpO2 99%  Physical Exam  Nursing note and vitals reviewed. Constitutional: She is oriented to person, place, and time. She  appears well-developed and well-nourished. No distress.  HENT:  Head: Normocephalic and atraumatic.  Mouth/Throat: Oropharynx is clear and moist.  Eyes: EOM are normal. Pupils are equal, round, and reactive to light. No foreign bodies found. Right eye exhibits no discharge. Left eye exhibits no chemosis, no discharge, no exudate and no hordeolum. No foreign body present in the left eye. Left conjunctiva is injected. Left conjunctiva has no hemorrhage. Left eye exhibits normal extraocular motion and no nystagmus.  Fundoscopic exam:      The left eye shows no exudate, no hemorrhage and no papilledema.  Slit lamp exam:      The left eye shows no corneal abrasion, no corneal flare, no corneal ulcer, no foreign body, no hyphema and no fluorescein uptake.  Neck: Normal range of motion and phonation normal. Neck supple. No rigidity. No Brudzinski's sign and no Kernig's sign noted.  Cardiovascular: Normal rate, regular rhythm, normal heart sounds and intact distal pulses.   No murmur heard. Pulmonary/Chest: Effort normal and breath sounds normal. No respiratory distress.  Musculoskeletal: Normal range of motion.  Neurological: She is alert and oriented to person, place, and time. No cranial nerve deficit or sensory deficit. She exhibits normal muscle tone. Coordination and gait normal.  Reflex Scores:      Tricep reflexes are 2+ on the right side and 2+ on the left side.      Bicep reflexes are 2+ on the right side and 2+ on the left side. Skin: Skin is warm and dry.    ED Course  Procedures (including critical care time)  Labs Reviewed - No data to display No results found.      MDM      Visual Acuity:    Bilateral Distance 20/20  R Distance 20/20  L Distance 20/20     Patient is well appearing.  Minimal redness of the left sclera. Likely anterior iritis No obvious FB's, trauma, hyphema or corneal ulcer.  Patient last wore her contacts one week ago.  Advised not to wear contacts for  at least two weeks or until advised by her ophthalmologist.  Will treat with prednisone ,  tobramycin and ketorolac ophth. Drops.  Advised her to f/u with her eye doctor on Monday and also given referral info for [facility].        Drishti Pepperman L. Kaizlee Carlino, PA-C 06/21/13 1703

## 2013-06-19 NOTE — ED Notes (Signed)
Visual Acuity was done with glasses on.

## 2013-06-22 NOTE — ED Provider Notes (Signed)
Medical screening examination/treatment/procedure(s) were performed by non-physician practitioner and as supervising physician I was immediately available for consultation/collaboration.   Benny Lennert, MD 06/22/13 [phone]

## 2013-07-19 ENCOUNTER — Other Ambulatory Visit: Payer: Self-pay | Admitting: Adult Health

## 2013-07-31 ENCOUNTER — Emergency Department (HOSPITAL_COMMUNITY)
Admission: EM | Admit: 2013-07-31 | Discharge: 2013-08-01 | Disposition: A | Discharge status: Home / Self Care | Payer: BC Managed Care – PPO | Attending: Emergency Medicine | Admitting: Emergency Medicine

## 2013-07-31 ENCOUNTER — Encounter (HOSPITAL_COMMUNITY): Payer: Self-pay | Admitting: *Deleted

## 2013-07-31 DIAGNOSIS — G43909 Migraine, unspecified, not intractable, without status migrainosus: Secondary | ICD-10-CM | POA: Insufficient documentation

## 2013-07-31 DIAGNOSIS — R112 Nausea with vomiting, unspecified: Secondary | ICD-10-CM | POA: Insufficient documentation

## 2013-07-31 DIAGNOSIS — J45909 Unspecified asthma, uncomplicated: Secondary | ICD-10-CM | POA: Insufficient documentation

## 2013-07-31 DIAGNOSIS — Z79899 Other long term (current) drug therapy: Secondary | ICD-10-CM | POA: Insufficient documentation

## 2013-07-31 DIAGNOSIS — G8929 Other chronic pain: Secondary | ICD-10-CM | POA: Insufficient documentation

## 2013-07-31 DIAGNOSIS — F17200 Nicotine dependence, unspecified, uncomplicated: Secondary | ICD-10-CM | POA: Insufficient documentation

## 2013-07-31 DIAGNOSIS — Z8659 Personal history of other mental and behavioral disorders: Secondary | ICD-10-CM | POA: Insufficient documentation

## 2013-07-31 DIAGNOSIS — Z8619 Personal history of other infectious and parasitic diseases: Secondary | ICD-10-CM | POA: Insufficient documentation

## 2013-07-31 MED ORDER — DEXAMETHASONE 6 MG PO TABS
10.0000 mg | ORAL_TABLET | Freq: Once | ORAL | Status: AC
  Administered 2013-07-31: 10 mg via ORAL
  Filled 2013-07-31: qty 1

## 2013-07-31 MED ORDER — KETOROLAC TROMETHAMINE 60 MG/2ML IM SOLN
60.0000 mg | Freq: Once | INTRAMUSCULAR | Status: AC
  Administered 2013-07-31: 60 mg via INTRAMUSCULAR
  Filled 2013-07-31: qty 2

## 2013-07-31 MED ORDER — PROMETHAZINE HCL 25 MG PO TABS: 25.0000 mg | ORAL_TABLET | Freq: Four times a day (QID) | ORAL | Status: DC | PRN

## 2013-07-31 MED ORDER — ONDANSETRON 8 MG PO TBDP
8.0000 mg | ORAL_TABLET | Freq: Once | ORAL | Status: AC
  Administered 2013-07-31: 8 mg via ORAL
  Filled 2013-07-31: qty 1

## 2013-07-31 MED ORDER — HYDROMORPHONE HCL PF 2 MG/ML IJ SOLN
2.0000 mg | Freq: Once | INTRAMUSCULAR | Status: AC
  Administered 2013-07-31: 2 mg via INTRAMUSCULAR
  Filled 2013-07-31: qty 1

## 2013-07-31 MED ORDER — DEXAMETHASONE 4 MG PO TABS
ORAL_TABLET | ORAL | Status: AC
  Filled 2013-07-31: qty 3

## 2013-07-31 MED ORDER — DIPHENHYDRAMINE HCL 50 MG/ML IJ SOLN
50.0000 mg | Freq: Once | INTRAMUSCULAR | Status: AC
  Administered 2013-07-31: 50 mg via INTRAMUSCULAR
  Filled 2013-07-31: qty 1

## 2013-07-31 MED ORDER — OXYCODONE-ACETAMINOPHEN 5-325 MG PO TABS: ORAL_TABLET | ORAL | Status: DC

## 2013-07-31 NOTE — ED Notes (Signed)
Pt with migraine HA since 0100 this morning and has N/V, has taken two Imitrex injections last at 1600 today

## 2013-07-31 NOTE — ED Provider Notes (Signed)
CSN: [id number]     Arrival date & time 07/31/13  2149 History     First MD Initiated Contact with Patient 07/31/13 2244     Chief Complaint  Patient presents with  . Migraine    HPI Pt was seen at 2240.  Per pt, c/o gradual onset and persistence of constant acute flair of her chronic migraine headache since last night. Has been associated with several intermittent episodes of N/V.  Describes the headache as per her usual chronic daily migraine headache pain pattern for many years. States she took her usual Imitrex without relief.  Denies headache was sudden or maximal in onset or at any time.  Denies visual changes, no focal motor weakness, no tingling/numbness in extremities, no fevers, no neck pain, no rash.     [facility] Past Medical History  Diagnosis Date  . Asthma   . Anxiety   . Panic attack   . Depression   . Migraine   . Abnormal Pap smear   . HSV-2 (herpes simplex virus 2) infection    Past Surgical History  Procedure Laterality Date  . Cesarean section    . Ovarian cyst removal    . Appendectomy    . Cholecystectomy    . Dilation and curettage of uterus     Family History  Problem Relation Age of Onset  . Diabetes Other   . Cancer Other     breast  . Heart failure Other    History  Substance Use Topics  . Smoking status: Current Every Day Smoker -- 0.50 packs/day for 12 years    Types: Cigarettes  . Smokeless tobacco: Never Used  . Alcohol Use: Yes     Comment: socially   OB History   Grav Para Term Preterm Abortions TAB SAB Ect Mult Living   7 2 1 1 5  5   1      Review of Systems ROS: Statement: All systems negative except as marked or noted in the HPI; Constitutional: Negative for fever and chills. ; ; Eyes: Negative for eye pain, redness and discharge. ; ; ENMT: Negative for ear pain, hoarseness, nasal congestion, sinus pressure and sore throat. ; ; Cardiovascular: Negative for chest pain, palpitations, diaphoresis, dyspnea and  peripheral edema. ; ; Respiratory: Negative for cough, wheezing and stridor. ; ; Gastrointestinal: +N/V. Negative for diarrhea, abdominal pain, blood in stool, hematemesis, jaundice and rectal bleeding. . ; ; Genitourinary: Negative for dysuria, flank pain and hematuria. ; ; Musculoskeletal: Negative for back pain and neck pain. Negative for swelling and trauma.; ; Skin: Negative for pruritus, rash, abrasions, blisters, bruising and skin lesion.; ; Neuro: +headache. Negative for lightheadedness and neck stiffness. Negative for weakness, altered level of consciousness , altered mental status, extremity weakness, paresthesias, involuntary movement, seizure and syncope.       Allergies  Coconut flavor; Codeine; and Vicodin  Home Medications   Current Outpatient Rx  Name  Route  Sig  Dispense  Refill  . phentermine 37.5 MG capsule   Oral   Take 37.5 mg by mouth daily.         . SUMAtriptan (IMITREX) 6 MG/0.5ML SOLN injection   Subcutaneous   Inject 6 mg into the skin every 2 (two) hours as needed for migraine or headache. F         . etonogestrel (IMPLANON) 68 MG IMPL implant   Subcutaneous   Inject 1 each into the skin once.           Marland Kitchen  oxyCODONE-acetaminophen (PERCOCET/ROXICET) 5-325 MG per tablet      1 or 2 tabs PO q6h prn pain   10 tablet   0   . EXPIRED: promethazine (PHENERGAN) 25 MG tablet   Oral   Take 1 tablet (25 mg total) by mouth every 6 (six) hours as needed for nausea.   15 tablet   0   . promethazine (PHENERGAN) 25 MG tablet   Oral   Take 1 tablet (25 mg total) by mouth every 6 (six) hours as needed for nausea (or headache).   8 tablet   0    BP 127/64  Pulse 85  Temp(Src) 98.3 F (36.8 C) (Oral)  Resp 20  Ht 5\' 6"  (1.676 m)  Wt 268 lb (121.564 kg)  BMI 43.28 kg/m2  SpO2 100% Physical Exam 2245: Physical examination:  Nursing notes reviewed; Vital signs and O2 SAT reviewed;  Constitutional: Well developed, Well nourished, Well hydrated, In no  acute distress; Head:  Normocephalic, atraumatic; Eyes: EOMI, PERRL, No scleral icterus; ENMT: Mouth and pharynx normal, Mucous membranes moist; Neck: Supple, Full range of motion, No lymphadenopathy; Cardiovascular: Regular rate and rhythm, No murmur, rub, or gallop; Respiratory: Breath sounds clear & equal bilaterally, No rales, rhonchi, wheezes.  Speaking full sentences with ease, Normal respiratory effort/excursion; Chest: Nontender, Movement normal; Abdomen: Soft, Nontender, Nondistended, Normal bowel sounds;; Extremities: Pulses normal, No tenderness, No edema, No calf edema or asymmetry.; Neuro: AA&Ox3, Major CN grossly intact. No facial droop. Speech clear. Climbs on and off stretcher easily by herself. Gait steady. No gross focal motor or sensory deficits in extremities.; Skin: Color normal, Warm, Dry.   ED Course   Procedures  MDM  *MDM Reviewed: previous chart, nursing note and vitals   2330:  Long hx of chronic pain with multiple ED visits for same.  Pt endorses acute flair of her usual long standing chronic pain today, no change from her usual chronic pain pattern.  Pt encouraged to f/u with her PMD and the [facility] doctor for good continuity of care and control of her chronic pain.  Verb understanding.     Laray Anger, DO 08/03/13 1530

## 2013-08-01 NOTE — ED Notes (Signed)
Pt reporting no relief from medication.  Explained to pt that medication hasn't had time to reach full effect.  Pt angry she wasn't discharged with go pack, and only given prescription.

## 2013-09-21 ENCOUNTER — Other Ambulatory Visit: Payer: Self-pay | Admitting: Adult Health

## 2013-11-19 ENCOUNTER — Encounter (HOSPITAL_COMMUNITY): Payer: Self-pay | Admitting: Emergency Medicine

## 2013-11-19 ENCOUNTER — Emergency Department (HOSPITAL_COMMUNITY)
Admission: EM | Admit: 2013-11-19 | Discharge: 2013-11-20 | Disposition: A | Discharge status: Home / Self Care | Payer: Medicaid Other | Attending: Emergency Medicine | Admitting: Emergency Medicine

## 2013-11-19 DIAGNOSIS — Z8659 Personal history of other mental and behavioral disorders: Secondary | ICD-10-CM | POA: Insufficient documentation

## 2013-11-19 DIAGNOSIS — IMO0002 Reserved for concepts with insufficient information to code with codable children: Secondary | ICD-10-CM | POA: Insufficient documentation

## 2013-11-19 DIAGNOSIS — Y929 Unspecified place or not applicable: Secondary | ICD-10-CM | POA: Insufficient documentation

## 2013-11-19 DIAGNOSIS — G43909 Migraine, unspecified, not intractable, without status migrainosus: Secondary | ICD-10-CM | POA: Insufficient documentation

## 2013-11-19 DIAGNOSIS — Z8619 Personal history of other infectious and parasitic diseases: Secondary | ICD-10-CM | POA: Insufficient documentation

## 2013-11-19 DIAGNOSIS — F17200 Nicotine dependence, unspecified, uncomplicated: Secondary | ICD-10-CM | POA: Insufficient documentation

## 2013-11-19 DIAGNOSIS — S0990XA Unspecified injury of head, initial encounter: Secondary | ICD-10-CM | POA: Insufficient documentation

## 2013-11-19 DIAGNOSIS — Y9389 Activity, other specified: Secondary | ICD-10-CM | POA: Insufficient documentation

## 2013-11-19 DIAGNOSIS — R519 Headache, unspecified: Secondary | ICD-10-CM

## 2013-11-19 DIAGNOSIS — J45909 Unspecified asthma, uncomplicated: Secondary | ICD-10-CM | POA: Insufficient documentation

## 2013-11-19 MED ORDER — KETOROLAC TROMETHAMINE 30 MG/ML IJ SOLN
30.0000 mg | Freq: Once | INTRAMUSCULAR | Status: AC
  Administered 2013-11-19: 30 mg via INTRAVENOUS
  Filled 2013-11-19: qty 1

## 2013-11-19 MED ORDER — SODIUM CHLORIDE 0.9 % IV SOLN
1000.0000 mL | Freq: Once | INTRAVENOUS | Status: AC
  Administered 2013-11-19: 1000 mL via INTRAVENOUS

## 2013-11-19 MED ORDER — METOCLOPRAMIDE HCL 5 MG/ML IJ SOLN
10.0000 mg | Freq: Once | INTRAMUSCULAR | Status: AC
  Administered 2013-11-19: 10 mg via INTRAVENOUS
  Filled 2013-11-19: qty 2

## 2013-11-19 MED ORDER — MAGNESIUM SULFATE IN D5W 10-5 MG/ML-% IV SOLN
1.0000 g | Freq: Once | INTRAVENOUS | Status: AC
  Administered 2013-11-20: 1 g via INTRAVENOUS
  Filled 2013-11-19: qty 100

## 2013-11-19 MED ORDER — SODIUM CHLORIDE 0.9 % IV SOLN
1000.0000 mL | INTRAVENOUS | Status: DC
  Administered 2013-11-19: 1000 mL via INTRAVENOUS

## 2013-11-19 MED ORDER — VALPROATE SODIUM 500 MG/5ML IV SOLN
1000.0000 mg | Freq: Once | INTRAVENOUS | Status: AC
  Administered 2013-11-19: 1000 mg via INTRAVENOUS
  Filled 2013-11-19: qty 10

## 2013-11-19 MED ORDER — VALPROATE SODIUM 500 MG/5ML IV SOLN
INTRAVENOUS | Status: AC
  Filled 2013-11-19: qty 10

## 2013-11-19 MED ORDER — MAGNESIUM SULFATE IN D5W 10-5 MG/ML-% IV SOLN
1.0000 g | Freq: Once | INTRAVENOUS | Status: AC
  Administered 2013-11-19: 1 g via INTRAVENOUS
  Filled 2013-11-19: qty 100

## 2013-11-19 MED ORDER — DIPHENHYDRAMINE HCL 50 MG/ML IJ SOLN
25.0000 mg | Freq: Once | INTRAMUSCULAR | Status: AC
  Administered 2013-11-19: 25 mg via INTRAVENOUS
  Filled 2013-11-19: qty 1

## 2013-11-19 MED ORDER — MAGNESIUM SULFATE IN D5W 10-5 MG/ML-% IV SOLN
INTRAVENOUS | Status: AC
  Filled 2013-11-19: qty 100

## 2013-11-19 MED ORDER — LORAZEPAM 2 MG/ML IJ SOLN
1.0000 mg | Freq: Once | INTRAMUSCULAR | Status: AC
  Administered 2013-11-19: 1 mg via INTRAVENOUS
  Filled 2013-11-19: qty 1

## 2013-11-19 NOTE — ED Notes (Signed)
No nausea, given ice chips

## 2013-11-19 NOTE — ED Notes (Signed)
Hit head on car door yesterday and now with continued HA and now N/V since this morning, denies LOC

## 2013-11-19 NOTE — ED Provider Notes (Signed)
CSN: [id number]     Arrival date & time 11/19/13  1923 History   First MD Initiated Contact with Patient 11/19/13 1929     Chief Complaint  Patient presents with  . Head Injury  . Dizziness   (Consider location/radiation/quality/duration/timing/severity/associated sxs/prior Treatment) HPI  Patient reports she has a history of migraine headaches. She reports yesterday she was bending down to pick up a toy her daughter had dropped in the car and when she stood up she hit her head on the top of the open door frame, she did not have loss of consciousness. She states she felt dizzy for a while. She states she started out with some mild discomfort but now it has progressed to a migraine-type headache that she's had in the past. She states her pain is on the top of her head and slightly to the right. She states she has a throbbing headache like someone beating her in the head on the right upper side of her head. She has had nausea and vomiting about 6 times. She denies any diarrhea. She has photophobia and some noise sensitivity. She denies any visual changes. She denies any numbness or tingling in her extremities. She states she gets a bad migraine about once or twice every 2-3 months.   PCP Dr Phillips Odor  Past Medical History  Diagnosis Date  . Asthma   . Anxiety   . Panic attack   . Depression   . Migraine   . Abnormal Pap smear   . HSV-2 (herpes simplex virus 2) infection    Past Surgical History  Procedure Laterality Date  . Cesarean section    . Ovarian cyst removal    . Appendectomy    . Cholecystectomy    . Dilation and curettage of uterus     Family History  Problem Relation Age of Onset  . Diabetes Other   . Cancer Other     breast  . Heart failure Other    History  Substance Use Topics  . Smoking status: Current Every Day Smoker -- 0.50 packs/day for 12 years    Types: Cigarettes  . Smokeless tobacco: Never Used  . Alcohol Use: Yes     Comment: socially   Full time  student  OB History   Grav Para Term Preterm Abortions TAB SAB Ect Mult Living   7 2 1 1 5  5   1      Review of Systems  All other systems reviewed and are negative.    Allergies  Coconut flavor; Codeine; and Vicodin  Home Medications   Current Outpatient Rx  Name  Route  Sig  Dispense  Refill  . SUMAtriptan (IMITREX) 6 MG/0.5ML SOLN injection   Subcutaneous   Inject 6 mg into the skin every 2 (two) hours as needed for migraine or headache. F         . etonogestrel (IMPLANON) 68 MG IMPL implant   Subcutaneous   Inject 1 each into the skin once.            BP 138/78  Pulse 102  Temp(Src) 98 F (36.7 C) (Oral)  Resp 20  Ht 5\' 6"  (1.676 m)  Wt 242 lb (109.77 kg)  BMI 39.08 kg/m2  SpO2 99%  Vital signs normal except tachycardia  Physical Exam  Nursing note and vitals reviewed. Constitutional: She is oriented to person, place, and time. She appears well-developed and well-nourished.  Non-toxic appearance. She does not appear ill. No distress.  HENT:  Head: Normocephalic and atraumatic.    Right Ear: External ear normal.  Left Ear: External ear normal.  Nose: Nose normal. No mucosal edema or rhinorrhea.  Mouth/Throat: Oropharynx is clear and moist and mucous membranes are normal. No dental abscesses or uvula swelling.  Location of headache noted  Eyes: Conjunctivae and EOM are normal. Pupils are equal, round, and reactive to light.  Neck: Normal range of motion and full passive range of motion without pain. Neck supple.  Cardiovascular: Normal rate, regular rhythm and normal heart sounds.  Exam reveals no gallop and no friction rub.   No murmur heard. Pulmonary/Chest: Effort normal and breath sounds normal. No respiratory distress. She has no wheezes. She has no rhonchi. She has no rales. She exhibits no tenderness and no crepitus.  Abdominal: Soft. Normal appearance and bowel sounds are normal. She exhibits no distension. There is no tenderness. There is no  rebound and no guarding.  Musculoskeletal: Normal range of motion. She exhibits no edema and no tenderness.  Moves all extremities well.   Neurological: She is alert and oriented to person, place, and time. She has normal strength. No cranial nerve deficit.  Skin: Skin is warm, dry and intact. No rash noted. No erythema. No pallor.  Psychiatric: She has a normal mood and affect. Her speech is normal and behavior is normal. Her mood appears not anxious.    ED Course  Procedures (including critical care time)  Medications  0.9 %  sodium chloride infusion (0 mLs Intravenous Stopped 11/19/13 2108)    Followed by  0.9 %  sodium chloride infusion (1,000 mLs Intravenous New Bag/Given 11/19/13 2113)  metoCLOPramide (REGLAN) injection 10 mg (10 mg Intravenous Given 11/19/13 2024)  diphenhydrAMINE (BENADRYL) injection 25 mg (25 mg Intravenous Given 11/19/13 2028)  ketorolac (TORADOL) 30 MG/ML injection 30 mg (30 mg Intravenous Given 11/19/13 2030)  valproate (DEPACON) 1,000 mg in dextrose 5 % 50 mL IVPB (0 mg Intravenous Stopped 11/19/13 2309)  magnesium sulfate IVPB 1 g 100 mL (0 g Intravenous Stopped 11/20/13 0019)  LORazepam (ATIVAN) injection 1 mg (1 mg Intravenous Given 11/19/13 2246)  magnesium sulfate IVPB 1 g 100 mL (1 g Intravenous New Bag/Given 11/20/13 0019)    Headache continues to improve, but is not gone. States she feels she can go home and sleep now. We discussed not using narcotics for migraine headaches and she agrees.    Labs Review Labs Reviewed - No data to display Imaging Review No results found.  EKG Interpretation   None       MDM   1. Headache     New Prescriptions   ONDANSETRON (ZOFRAN ODT) 8 MG DISINTEGRATING TABLET    Take 1 tablet (8 mg total) by mouth every 8 (eight) hours as needed for nausea or vomiting.   Plan discharge   Devoria Albe, MD, Franz Dell, MD 11/20/13 [phone]

## 2013-11-20 MED ORDER — MAGNESIUM SULFATE IN D5W 10-5 MG/ML-% IV SOLN
INTRAVENOUS | Status: AC
  Filled 2013-11-20: qty 100

## 2013-11-20 MED ORDER — ONDANSETRON 8 MG PO TBDP: 8.0000 mg | ORAL_TABLET | Freq: Three times a day (TID) | ORAL | Status: DC | PRN

## 2013-12-08 ENCOUNTER — Encounter (HOSPITAL_COMMUNITY): Payer: Self-pay | Admitting: Emergency Medicine

## 2013-12-08 ENCOUNTER — Emergency Department (HOSPITAL_COMMUNITY)
Admission: EM | Admit: 2013-12-08 | Discharge: 2013-12-08 | Disposition: A | Discharge status: Home / Self Care | Payer: Medicaid Other | Attending: Emergency Medicine | Admitting: Emergency Medicine

## 2013-12-08 ENCOUNTER — Emergency Department (HOSPITAL_COMMUNITY): Payer: Medicaid Other

## 2013-12-08 DIAGNOSIS — W010XXA Fall on same level from slipping, tripping and stumbling without subsequent striking against object, initial encounter: Secondary | ICD-10-CM | POA: Insufficient documentation

## 2013-12-08 DIAGNOSIS — S1093XA Contusion of unspecified part of neck, initial encounter: Secondary | ICD-10-CM

## 2013-12-08 DIAGNOSIS — S0003XA Contusion of scalp, initial encounter: Secondary | ICD-10-CM | POA: Insufficient documentation

## 2013-12-08 DIAGNOSIS — J45909 Unspecified asthma, uncomplicated: Secondary | ICD-10-CM | POA: Insufficient documentation

## 2013-12-08 DIAGNOSIS — Y9301 Activity, walking, marching and hiking: Secondary | ICD-10-CM | POA: Insufficient documentation

## 2013-12-08 DIAGNOSIS — Z791 Long term (current) use of non-steroidal anti-inflammatories (NSAID): Secondary | ICD-10-CM | POA: Insufficient documentation

## 2013-12-08 DIAGNOSIS — G43909 Migraine, unspecified, not intractable, without status migrainosus: Secondary | ICD-10-CM | POA: Insufficient documentation

## 2013-12-08 DIAGNOSIS — W1809XA Striking against other object with subsequent fall, initial encounter: Secondary | ICD-10-CM | POA: Insufficient documentation

## 2013-12-08 DIAGNOSIS — IMO0002 Reserved for concepts with insufficient information to code with codable children: Secondary | ICD-10-CM | POA: Insufficient documentation

## 2013-12-08 DIAGNOSIS — Z8619 Personal history of other infectious and parasitic diseases: Secondary | ICD-10-CM | POA: Insufficient documentation

## 2013-12-08 DIAGNOSIS — F17200 Nicotine dependence, unspecified, uncomplicated: Secondary | ICD-10-CM | POA: Insufficient documentation

## 2013-12-08 DIAGNOSIS — F3289 Other specified depressive episodes: Secondary | ICD-10-CM | POA: Insufficient documentation

## 2013-12-08 DIAGNOSIS — F329 Major depressive disorder, single episode, unspecified: Secondary | ICD-10-CM | POA: Insufficient documentation

## 2013-12-08 DIAGNOSIS — S0990XA Unspecified injury of head, initial encounter: Secondary | ICD-10-CM | POA: Insufficient documentation

## 2013-12-08 DIAGNOSIS — M6281 Muscle weakness (generalized): Secondary | ICD-10-CM | POA: Insufficient documentation

## 2013-12-08 DIAGNOSIS — R209 Unspecified disturbances of skin sensation: Secondary | ICD-10-CM | POA: Insufficient documentation

## 2013-12-08 DIAGNOSIS — R112 Nausea with vomiting, unspecified: Secondary | ICD-10-CM | POA: Insufficient documentation

## 2013-12-08 DIAGNOSIS — F410 Panic disorder [episodic paroxysmal anxiety] without agoraphobia: Secondary | ICD-10-CM | POA: Insufficient documentation

## 2013-12-08 DIAGNOSIS — Y929 Unspecified place or not applicable: Secondary | ICD-10-CM | POA: Insufficient documentation

## 2013-12-08 DIAGNOSIS — Z79899 Other long term (current) drug therapy: Secondary | ICD-10-CM | POA: Insufficient documentation

## 2013-12-08 MED ORDER — MORPHINE SULFATE 10 MG/ML IJ SOLN
10.0000 mg | Freq: Once | INTRAMUSCULAR | Status: AC
  Administered 2013-12-08: 10 mg via INTRAMUSCULAR
  Filled 2013-12-08: qty 1

## 2013-12-08 MED ORDER — PROMETHAZINE HCL 25 MG/ML IJ SOLN
25.0000 mg | Freq: Once | INTRAMUSCULAR | Status: AC
  Administered 2013-12-08: 25 mg via INTRAMUSCULAR
  Filled 2013-12-08: qty 1

## 2013-12-08 MED ORDER — NAPROXEN 500 MG PO TABS: 500.0000 mg | ORAL_TABLET | Freq: Two times a day (BID) | ORAL | Status: DC

## 2013-12-08 MED ORDER — ONDANSETRON 4 MG PO TBDP: 4.0000 mg | ORAL_TABLET | Freq: Three times a day (TID) | ORAL | Status: DC | PRN

## 2013-12-08 MED ORDER — HYDROCODONE-ACETAMINOPHEN 5-325 MG PO TABS: 2.0000 | ORAL_TABLET | ORAL | Status: DC | PRN

## 2013-12-08 NOTE — ED Provider Notes (Signed)
CSN: [id number]     Arrival date & time 12/08/13  1105 History  This chart was scribed for Roney Marion, MD,  by Ashley Jacobs, ED Scribe. The patient was seen in room APA19/APA19 and the patient's care was started at 11:59 AM.    First MD Initiated Contact with Patient 12/08/13 1149     Chief Complaint  Patient presents with  . Fall   (Consider location/radiation/quality/duration/timing/severity/associated sxs/prior Treatment) The history is provided by the patient. No language interpreter was used.   HPI Comments: Barbara Jennings is a 32 y.o. female who presents to the Emergency Department complaining of fall. Pt explains she was slipped backwards and fell down four steps 10 hours ago.  During the fall the base of her neck impacted the edge of the step causing her head to extends backwards.She denies LOC. After the fall she was able to ambulate but she had generalized weakness. Pt has vomited four times since the incident. She reports the associated symptoms of nausea, headache "pounding", and severe neck pain. Pt is experiencing paraesthesia to her upper extremities. She denies shooting pain down her arms, neck and back. Pt states her anterior right shoulder and upper chest is mildly painful. Denies incontinence, abdominal pain, hip pain and loss of gait. Past Medical History  Diagnosis Date  . Asthma   . Anxiety   . Panic attack   . Depression   . Migraine   . Abnormal Pap smear   . HSV-2 (herpes simplex virus 2) infection    Past Surgical History  Procedure Laterality Date  . Cesarean section    . Ovarian cyst removal    . Appendectomy    . Cholecystectomy    . Dilation and curettage of uterus     Family History  Problem Relation Age of Onset  . Diabetes Other   . Cancer Other     breast  . Heart failure Other    History  Substance Use Topics  . Smoking status: Current Every Day Smoker -- 0.50 packs/day for 12 years    Types: Cigarettes  . Smokeless tobacco: Never  Used  . Alcohol Use: Yes     Comment: socially   OB History   Grav Para Term Preterm Abortions TAB SAB Ect Mult Living   7 2 1 1 5  5   1      Review of Systems  Constitutional: Negative for activity change.  Cardiovascular: Positive for chest pain.  Gastrointestinal: Positive for nausea and vomiting. Negative for abdominal pain.  Musculoskeletal: Positive for arthralgias, back pain, myalgias, neck pain and neck stiffness. Negative for gait problem.  Neurological: Positive for weakness. Negative for dizziness and syncope.  Psychiatric/Behavioral: Negative for confusion.  All other systems reviewed and are negative.    Allergies  Coconut flavor  Home Medications   Current Outpatient Rx  Name  Route  Sig  Dispense  Refill  . etonogestrel (IMPLANON) 68 MG IMPL implant   Subcutaneous   Inject 1 each into the skin once.           . SUMAtriptan (IMITREX) 6 MG/0.5ML SOLN injection   Subcutaneous   Inject 6 mg into the skin every 2 (two) hours as needed for migraine or headache. F         . HYDROcodone-acetaminophen (NORCO/VICODIN) 5-325 MG per tablet   Oral   Take 2 tablets by mouth every 4 (four) hours as needed.   10 tablet   0   .  naproxen (NAPROSYN) 500 MG tablet   Oral   Take 1 tablet (500 mg total) by mouth 2 (two) times daily.   30 tablet   0   . ondansetron (ZOFRAN ODT) 4 MG disintegrating tablet   Oral   Take 1 tablet (4 mg total) by mouth every 8 (eight) hours as needed for nausea.   6 tablet   0    BP 124/88  Pulse 85  Temp(Src) 98 F (36.7 C) (Oral)  Resp 16  Ht 5\' 6"  (1.676 m)  Wt 234 lb (106.142 kg)  BMI 37.79 kg/m2  SpO2 100% Physical Exam  Nursing note and vitals reviewed. Constitutional: She is oriented to person, place, and time. She appears well-developed and well-nourished. No distress.  HENT:  Head: Normocephalic and atraumatic.  Mouth/Throat: Oropharynx is clear and moist.  Eyes: Conjunctivae are normal. Pupils are equal, round,  and reactive to light. No scleral icterus.  symmetric pupils 4mm  Neck: Neck supple.  Cardiovascular: Normal rate, regular rhythm, normal heart sounds and intact distal pulses.   No murmur heard. Pulmonary/Chest: Effort normal and breath sounds normal. No stridor. No respiratory distress. She has no rales.  Abdominal: Soft. Bowel sounds are normal. She exhibits no distension. There is no tenderness.  Musculoskeletal: She exhibits tenderness.  midline neck tenderness   Neurological: She is alert and oriented to person, place, and time.   Normal sensation above and below clavicle No pronator drift Normal feeling and normal strength  Skin: Skin is warm and dry. No rash noted.  Psychiatric: She has a normal mood and affect. Her behavior is normal.    ED Course  Procedures (including critical care time) DIAGNOSTIC STUDIES: Oxygen Saturation is 100% on room air, normal by my interpretation.    COORDINATION OF CARE: 12:03 Discussed course of care with pt which includes morphine injections 10 mg, phenergan 25 mg, head CT and cervical CT. Pt understands and agrees.  Labs Review Labs Reviewed - No data to display Imaging Review Ct Head Wo Contrast  12/08/2013   CLINICAL DATA:  Headache.  Fall.  EXAM: CT HEAD WITHOUT CONTRAST  CT CERVICAL SPINE WITHOUT CONTRAST  TECHNIQUE: Multidetector CT imaging of the head and cervical spine was performed following the standard protocol without intravenous contrast. Multiplanar CT image reconstructions of the cervical spine were also generated.  COMPARISON:  Head CT 04/10/2011 and cervical spine CT 12/30/2011  FINDINGS: CT HEAD FINDINGS  The ventricles and sulci are within normal limits for age. There is no evidence of acute infarct, intracranial hemorrhage, mass, midline shift, or extra-axial collection. The orbits are unremarkable. The visualized paranasal sinuses and mastoid air cells are clear. There is no evidence of acute fracture.  CT CERVICAL SPINE  FINDINGS  Well corticated ossicle inferior to the anterior C1 ring is unchanged and likely developmental. There is straightening of the normal cervical lordosis. There is no listhesis. There is no evidence of acute cervical spine fracture. Vertebral body heights are preserved. There is no prevertebral soft tissue swelling. Disc spaces are maintained. Visualized lung apices are clear.  IMPRESSION: 1. Unremarkable head CT. 2. No evidence of acute abnormality in the cervical spine.   Electronically Signed   By: Sebastian Ache   On: 12/08/2013 12:59   Ct Cervical Spine Wo Contrast  12/08/2013   CLINICAL DATA:  Headache.  Fall.  EXAM: CT HEAD WITHOUT CONTRAST  CT CERVICAL SPINE WITHOUT CONTRAST  TECHNIQUE: Multidetector CT imaging of the head and cervical spine was performed  following the standard protocol without intravenous contrast. Multiplanar CT image reconstructions of the cervical spine were also generated.  COMPARISON:  Head CT 04/10/2011 and cervical spine CT 12/30/2011  FINDINGS: CT HEAD FINDINGS  The ventricles and sulci are within normal limits for age. There is no evidence of acute infarct, intracranial hemorrhage, mass, midline shift, or extra-axial collection. The orbits are unremarkable. The visualized paranasal sinuses and mastoid air cells are clear. There is no evidence of acute fracture.  CT CERVICAL SPINE FINDINGS  Well corticated ossicle inferior to the anterior C1 ring is unchanged and likely developmental. There is straightening of the normal cervical lordosis. There is no listhesis. There is no evidence of acute cervical spine fracture. Vertebral body heights are preserved. There is no prevertebral soft tissue swelling. Disc spaces are maintained. Visualized lung apices are clear.  IMPRESSION: 1. Unremarkable head CT. 2. No evidence of acute abnormality in the cervical spine.   Electronically Signed   By: Sebastian Ache   On: 12/08/2013 12:59    EKG Interpretation   None       MDM   1.  Neck contusion, initial encounter     CTs are normal. Neurological exam remained normal. She is removed from cervical collar. He'll be ice anti-inflammatories pain medications.  I personally performed the services described in this documentation, which was scribed in my presence. The recorded information has been reviewed and is accurate.   EKG Interpretation   None            Roney Marion, MD 12/08/13 1354

## 2013-12-08 NOTE — ED Notes (Signed)
Pt states a wooden step split into as she was walking from her porch. Pt fell back and hit her neck on a step. She has since developed a headache, neck pain, right upper chest/ shoulder pain, and has vomited x 4. Fall occurred at 0230 this morning.

## 2013-12-21 ENCOUNTER — Encounter (HOSPITAL_COMMUNITY): Payer: Self-pay | Admitting: Emergency Medicine

## 2013-12-21 ENCOUNTER — Emergency Department (HOSPITAL_COMMUNITY)
Admission: EM | Admit: 2013-12-21 | Discharge: 2013-12-22 | Disposition: A | Discharge status: Home / Self Care | Payer: Medicaid Other | Attending: Emergency Medicine | Admitting: Emergency Medicine

## 2013-12-21 DIAGNOSIS — Z8659 Personal history of other mental and behavioral disorders: Secondary | ICD-10-CM | POA: Insufficient documentation

## 2013-12-21 DIAGNOSIS — F17200 Nicotine dependence, unspecified, uncomplicated: Secondary | ICD-10-CM | POA: Insufficient documentation

## 2013-12-21 DIAGNOSIS — G43909 Migraine, unspecified, not intractable, without status migrainosus: Secondary | ICD-10-CM | POA: Insufficient documentation

## 2013-12-21 DIAGNOSIS — J45909 Unspecified asthma, uncomplicated: Secondary | ICD-10-CM | POA: Insufficient documentation

## 2013-12-21 DIAGNOSIS — Z8619 Personal history of other infectious and parasitic diseases: Secondary | ICD-10-CM | POA: Insufficient documentation

## 2013-12-21 DIAGNOSIS — Z79899 Other long term (current) drug therapy: Secondary | ICD-10-CM | POA: Insufficient documentation

## 2013-12-21 MED ORDER — DIPHENHYDRAMINE HCL 50 MG/ML IJ SOLN
25.0000 mg | Freq: Once | INTRAMUSCULAR | Status: AC
  Administered 2013-12-21: 25 mg via INTRAVENOUS
  Filled 2013-12-21: qty 1

## 2013-12-21 MED ORDER — DEXAMETHASONE SODIUM PHOSPHATE 4 MG/ML IJ SOLN
10.0000 mg | Freq: Once | INTRAMUSCULAR | Status: AC
  Administered 2013-12-21: 10 mg via INTRAVENOUS
  Filled 2013-12-21: qty 3

## 2013-12-21 MED ORDER — ONDANSETRON HCL 4 MG PO TABS: 4.0000 mg | ORAL_TABLET | Freq: Four times a day (QID) | ORAL | Status: DC

## 2013-12-21 MED ORDER — METOCLOPRAMIDE HCL 5 MG/ML IJ SOLN
10.0000 mg | Freq: Once | INTRAMUSCULAR | Status: AC
  Administered 2013-12-21: 10 mg via INTRAVENOUS
  Filled 2013-12-21: qty 2

## 2013-12-21 MED ORDER — SODIUM CHLORIDE 0.9 % IV SOLN
Freq: Once | INTRAVENOUS | Status: AC
  Administered 2013-12-21: 23:00:00 via INTRAVENOUS

## 2013-12-21 NOTE — ED Notes (Signed)
Pt with continued HA since fall about 2 weeks ago, pt with N/V starting today

## 2013-12-21 NOTE — ED Notes (Signed)
Pt given gingerale to take small sips and a warm blanket.

## 2013-12-21 NOTE — ED Provider Notes (Signed)
CSN: [id number]     Arrival date & time 12/21/13  2128 History   First MD Initiated Contact with Patient 12/21/13 2152     Chief Complaint  Patient presents with  . Headache   (Consider location/radiation/quality/duration/timing/severity/associated sxs/prior Treatment) Patient is a 32 y.o. female presenting with headaches. The history is provided by the patient.  Headache Pain location:  R parietal Quality: throbbing. Radiates to:  Does not radiate Severity currently:  9/10 Onset quality:  Gradual Duration:  3 days Timing:  Constant Progression:  Worsening Similar to prior headaches: yes   Relieved by:  Nothing Worsened by:  Light and sound Ineffective treatments:  Prescription medications and resting in a darkened room Associated symptoms: nausea, photophobia and vomiting   Associated symptoms: no back pain, no congestion, no cough, no ear pain, no facial pain, no fever, no neck pain and no neck stiffness    Barbara Jennings is a 32 y.o. female who presents to the ED with headache. She fell about 2 weeks ago and had neck and head pain. She got better after that. Three days ago she began having a headache again. She started back taking the hydrocodone and it was working at first but yesterday started having nausea and vomiting. She has a history of migraines and this feels similar to other migraines. The headache is above her right eye.    Past Medical History  Diagnosis Date  . Asthma   . Anxiety   . Panic attack   . Depression   . Migraine   . Abnormal Pap smear   . HSV-2 (herpes simplex virus 2) infection    Past Surgical History  Procedure Laterality Date  . Cesarean section    . Ovarian cyst removal    . Appendectomy    . Cholecystectomy    . Dilation and curettage of uterus     Family History  Problem Relation Age of Onset  . Diabetes Other   . Cancer Other     breast  . Heart failure Other    History  Substance Use Topics  . Smoking status: Current Every  Day Smoker -- 0.50 packs/day for 12 years    Types: Cigarettes  . Smokeless tobacco: Never Used  . Alcohol Use: Yes     Comment: socially   OB History   Grav Para Term Preterm Abortions TAB SAB Ect Mult Living   7 2 1 1 5  5   1      Review of Systems  Constitutional: Negative for fever and chills.  HENT: Negative for congestion and ear pain.   Eyes: Positive for photophobia.  Respiratory: Negative for cough.   Gastrointestinal: Positive for nausea and vomiting.  Genitourinary: Negative for dysuria and frequency.  Musculoskeletal: Negative for back pain, neck pain and neck stiffness.  Skin: Negative for rash and wound.  Neurological: Positive for light-headedness and headaches.  Psychiatric/Behavioral: Negative for confusion. The patient is not nervous/anxious.     Allergies  Coconut flavor  Home Medications   Current Outpatient Rx  Name  Route  Sig  Dispense  Refill  . etonogestrel (IMPLANON) 68 MG IMPL implant   Subcutaneous   Inject 1 each into the skin once.           Marland Kitchen HYDROcodone-acetaminophen (NORCO/VICODIN) 5-325 MG per tablet   Oral   Take 2 tablets by mouth every 4 (four) hours as needed.   10 tablet   0   . naproxen (NAPROSYN) 500 MG tablet  Oral   Take 1 tablet (500 mg total) by mouth 2 (two) times daily.   30 tablet   0   . ondansetron (ZOFRAN ODT) 4 MG disintegrating tablet   Oral   Take 1 tablet (4 mg total) by mouth every 8 (eight) hours as needed for nausea.   6 tablet   0   . SUMAtriptan (IMITREX) 6 MG/0.5ML SOLN injection   Subcutaneous   Inject 6 mg into the skin every 2 (two) hours as needed for migraine or headache. F          BP 121/65  Pulse 92  Temp(Src) 98.1 F (36.7 C) (Oral)  Resp 20  Ht 5\' 6"  (1.676 m)  Wt 237 lb (107.502 kg)  BMI 38.27 kg/m2  SpO2 100% Physical Exam  Nursing note and vitals reviewed. Constitutional: She is oriented to person, place, and time. She appears well-developed and well-nourished.  HENT:   Head: Normocephalic.  Right Ear: Tympanic membrane and external ear normal.  Left Ear: Tympanic membrane and external ear normal.  Mouth/Throat: Uvula is midline, oropharynx is clear and moist and mucous membranes are normal.  Eyes: Conjunctivae and EOM are normal. Pupils are equal, round, and reactive to light.  Neck: Normal range of motion. Neck supple.  Cardiovascular: Normal rate, regular rhythm and normal heart sounds.   Pulmonary/Chest: Effort normal and breath sounds normal. No respiratory distress.  Abdominal: Soft. Bowel sounds are normal. There is no tenderness. There is no CVA tenderness.  Musculoskeletal: Normal range of motion. She exhibits no edema and no tenderness.  Pedal and radial pulses strong bilateral.   Neurological: She is alert and oriented to person, place, and time. She has normal strength and normal reflexes. No cranial nerve deficit or sensory deficit. She displays a negative Romberg sign. Coordination and gait normal.  Skin: Skin is warm and dry.  Psychiatric: She has a normal mood and affect. Her behavior is normal. Judgment and thought content normal.    ED Course  Procedures: IV hydration, Benadryl 25 mg, Reglan 10 mg and Decadron 10 mg. IV @ 23:50 patient states she is feeling better and would like to try some PO fluids.   EKG Interpretation   None       MDM  32 y.o. female with headache similar to migraines she has had in the past. Normal neuro exam. Feeling better after treatment of medications and IV hydration in the ED. Stable for discharge home without any immediate complications. She will return for worsening symptoms. Will give Zofran for nauseas.  Discussed with the patient plan of care and all questioned fully answered. She voices understanding.    Medication List    TAKE these medications       ondansetron 4 MG tablet  Commonly known as:  ZOFRAN  Take 1 tablet (4 mg total) by mouth every 6 (six) hours.      ASK your doctor about  these medications       HYDROcodone-acetaminophen 5-325 MG per tablet  Commonly known as:  NORCO/VICODIN  Take 2 tablets by mouth every 4 (four) hours as needed.     IMPLANON 68 MG Impl implant  Generic drug:  etonogestrel  Inject 1 each into the skin once.     naproxen 500 MG tablet  Commonly known as:  NAPROSYN  Take 1 tablet (500 mg total) by mouth 2 (two) times daily.     ondansetron 4 MG disintegrating tablet  Commonly known as:  ZOFRAN ODT  Take 1 tablet (4 mg total) by mouth every 8 (eight) hours as needed for nausea.     SUMAtriptan 6 MG/0.5ML Soln injection  Commonly known as:  IMITREX  Inject 6 mg into the skin every 2 (two) hours as needed for migraine or headache. [id number] Perla, NP 12/21/13 [phone]

## 2013-12-22 NOTE — ED Provider Notes (Signed)
Medical screening examination/treatment/procedure(s) were conducted as a shared visit with non-physician practitioner(s) and myself.  I personally evaluated the patient during the encounter.  EKG Interpretation   None      No neurological deficits. Patient feels much better after IV fluids, pain management.   Donnetta Hutching, MD 12/22/13 [phone]

## 2014-01-26 ENCOUNTER — Other Ambulatory Visit: Payer: Self-pay | Admitting: Adult Health

## 2014-01-27 ENCOUNTER — Ambulatory Visit (INDEPENDENT_AMBULATORY_CARE_PROVIDER_SITE_OTHER): Payer: BC Managed Care – PPO | Admitting: Adult Health

## 2014-01-27 ENCOUNTER — Other Ambulatory Visit (HOSPITAL_COMMUNITY)
Admission: RE | Admit: 2014-01-27 | Discharge: 2014-01-27 | Disposition: A | Discharge status: Home / Self Care | Payer: Medicaid Other | Source: Ambulatory Visit | Attending: Adult Health | Admitting: Adult Health

## 2014-01-27 ENCOUNTER — Encounter: Payer: Self-pay | Admitting: Adult Health

## 2014-01-27 ENCOUNTER — Encounter (INDEPENDENT_AMBULATORY_CARE_PROVIDER_SITE_OTHER): Payer: Self-pay

## 2014-01-27 VITALS — BP 150/70 | HR 78 | Ht 67.0 in | Wt 282.0 lb

## 2014-01-27 DIAGNOSIS — Z0000 Encounter for general adult medical examination without abnormal findings: Secondary | ICD-10-CM

## 2014-01-27 DIAGNOSIS — Z1151 Encounter for screening for human papillomavirus (HPV): Secondary | ICD-10-CM | POA: Insufficient documentation

## 2014-01-27 DIAGNOSIS — Z124 Encounter for screening for malignant neoplasm of cervix: Secondary | ICD-10-CM | POA: Insufficient documentation

## 2014-01-27 DIAGNOSIS — R87810 Cervical high risk human papillomavirus (HPV) DNA test positive: Secondary | ICD-10-CM | POA: Insufficient documentation

## 2014-01-27 DIAGNOSIS — N949 Unspecified condition associated with female genital organs and menstrual cycle: Secondary | ICD-10-CM

## 2014-01-27 DIAGNOSIS — Z01419 Encounter for gynecological examination (general) (routine) without abnormal findings: Secondary | ICD-10-CM

## 2014-01-27 HISTORY — DX: Unspecified condition associated with female genital organs and menstrual cycle: N94.9

## 2014-01-27 LAB — COMPREHENSIVE METABOLIC PANEL
ALT: 17 U/L (ref 0–35)
AST: 17 U/L (ref 0–37)
Albumin: 4.2 g/dL (ref 3.5–5.2)
Alkaline Phosphatase: 63 U/L (ref 39–117)
BUN: 6 mg/dL (ref 6–23)
CO2: 25 meq/L (ref 19–32)
CREATININE: 0.61 mg/dL (ref 0.50–1.10)
Calcium: 9.2 mg/dL (ref 8.4–10.5)
Chloride: 104 mEq/L (ref 96–112)
Glucose, Bld: 81 mg/dL (ref 70–99)
Potassium: 4.4 mEq/L (ref 3.5–5.3)
SODIUM: 138 meq/L (ref 135–145)
TOTAL PROTEIN: 7.1 g/dL (ref 6.0–8.3)
Total Bilirubin: 0.7 mg/dL (ref 0.2–1.2)

## 2014-01-27 LAB — CBC
HCT: 38.1 % (ref 36.0–46.0)
HEMOGLOBIN: 13.2 g/dL (ref 12.0–15.0)
MCH: 28.9 pg (ref 26.0–34.0)
MCHC: 34.6 g/dL (ref 30.0–36.0)
MCV: 83.4 fL (ref 78.0–100.0)
PLATELETS: 355 10*3/uL (ref 150–400)
RBC: 4.57 MIL/uL (ref 3.87–5.11)
RDW: 14.2 % (ref 11.5–15.5)
WBC: 7.9 10*3/uL (ref 4.0–10.5)

## 2014-01-27 LAB — SEDIMENTATION RATE: Sed Rate: 14 mm/hr (ref 0–22)

## 2014-01-27 MED ORDER — HYDROCODONE-ACETAMINOPHEN 5-325 MG PO TABS
1.0000 | ORAL_TABLET | Freq: Four times a day (QID) | ORAL | Status: DC | PRN
Start: 2014-01-27 — End: 2014-02-16

## 2014-01-27 NOTE — Patient Instructions (Signed)
Pelvic Pain, Female Female pelvic pain can be caused by many different things and start from a variety of places. Pelvic pain refers to pain that is located in the lower half of the abdomen and between your hips. The pain may occur over a short period of time (acute) or may be reoccurring (chronic). The cause of pelvic pain may be related to disorders affecting the female reproductive organs (gynecologic), but it may also be related to the bladder, kidney stones, an intestinal complication, or muscle or skeletal problems. Getting help right away for pelvic pain is important, especially if there has been severe, sharp, or a sudden onset of unusual pain. It is also important to get help right away because some types of pelvic pain can be life threatening.  CAUSES  Below are only some of the causes of pelvic pain. The causes of pelvic pain can be in one of several categories.   Gynecologic.  Pelvic inflammatory disease.  Sexually transmitted infection.  Ovarian cyst or a twisted ovarian ligament (ovarian torsion).  Uterine lining that grows outside the uterus (endometriosis).  Fibroids, cysts, or tumors.  Ovulation.  Pregnancy.  Pregnancy that occurs outside the uterus (ectopic pregnancy).  Miscarriage.  Labor.  Abruption of the placenta or ruptured uterus.  Infection.  Uterine infection (endometritis).  Bladder infection.  Diverticulitis.  Miscarriage related to a uterine infection (septic abortion).  Bladder.  Inflammation of the bladder (cystitis).  Kidney stone(s).  Gastrointenstinal.  Constipation.  Diverticulitis.  Neurologic.  Trauma.  Feeling pelvic pain because of mental or emotional causes (psychosomatic).  Cancers of the bowel or pelvis. EVALUATION  Your caregiver will want to take a careful history of your concerns. This includes recent changes in your health, a careful gynecologic history of your periods (menses), and a sexual history. Obtaining  your family history and medical history is also important. Your caregiver may suggest a pelvic exam. A pelvic exam will help identify the location and severity of the pain. It also helps in the evaluation of which organ system may be involved. In order to identify the cause of the pelvic pain and be properly treated, your caregiver may order tests. These tests may include:   A pregnancy test.  Pelvic ultrasonography.  An X-ray exam of the abdomen.  A urinalysis or evaluation of vaginal discharge.  Blood tests. HOME CARE INSTRUCTIONS   Only take over-the-counter or prescription medicines for pain, discomfort, or fever as directed by your caregiver.   Rest as directed by your caregiver.   Eat a balanced diet.   Drink enough fluids to make your urine clear or pale yellow, or as directed.   Avoid sexual intercourse if it causes pain.   Apply warm or cold compresses to the lower abdomen depending on which one helps the pain.   Avoid stressful situations.   Keep a journal of your pelvic pain. Write down when it started, where the pain is located, and if there are things that seem to be associated with the pain, such as food or your menstrual cycle.  Follow up with your caregiver as directed.  SEEK MEDICAL CARE IF:  Your medicine does not help your pain.  You have abnormal vaginal discharge. SEEK IMMEDIATE MEDICAL CARE IF:   You have heavy bleeding from the vagina.   Your pelvic pain increases.   You feel lightheaded or faint.   You have chills.   You have pain with urination or blood in your urine.   You have uncontrolled  diarrhea or vomiting.   You have a fever or persistent symptoms for more than 3 days.  You have a fever and your symptoms suddenly get worse.   You are being physically or sexually abused.  MAKE SURE YOU:  Understand these instructions.  Will watch your condition.  Will get help if you are not doing well or get worse. Document  Released: 11/11/2004 Document Revised: 06/15/2012 Document Reviewed: 04/05/2012 [facility]Exit[facility] Patient Information 2014 [place], [place]. Return on 2/9 for [place]

## 2014-01-27 NOTE — Progress Notes (Signed)
Patient ID: Barbara Jennings, female   DOB: 22-Jun-1981, 33 y.o.   MRN: [id number] History of Present Illness: Barbara Jennings is a 33 year old white female, divorced, in for a pap and physical and she is complaining of pelvic pain right at C section scar, has pressure and pulling.Has  Implanon and it is due out in February.   Current Medications, Allergies, Past Medical History, Past Surgical History, Family History and Social History were reviewed in [employer] record.     Review of Systems: Patient denies any headaches, blurred vision, shortness of breath, chest pain, abdominal pain, problems with bowel movements, urination, or intercourse, not having sex last time 2 years ago. No joint pain or mood swings    Physical Exam:BP 150/70  Pulse 78  Ht _0  (1.702 m)  Wt 282 lb (127.914 kg)  BMI 44.16 kg/m2 General:  Well developed, well nourished, no acute distress Skin:  Warm and dry Neck:  Midline trachea, normal thyroid Lungs; Clear to auscultation bilaterally Breast:  No dominant palpable mass, retraction, or nipple discharge, has hidradenitis  Cardiovascular: Regular rate and rhythm Abdomen:  Soft, non tender, no hepatosplenomegaly Pelvic:  External genitalia is normal in appearance.  The vagina is normal in appearance. The cervix is bulbous, pap with HPV performed. Negative CMT. Uterus is felt to be normal size, shape, and contour.  No  adnexal masses, has tenderness RLQ  And near scar. Extremities:  No swelling or varicosities noted Psych:  No mood changes, alert and cooperative ,seems happy  GC/CHL sent  Impression: Yearly gyn exam Pelvic pain    Plan: Return on 2/9 for [place] and see me, will schedule implanon removal then Check CBC,CMP,TSH and ESR rx norco 5-325 mg #30 1 every 6 hours prn pain no refills Review handout on pelvic pain Physical in 1 year

## 2014-01-28 LAB — TSH: TSH: 1.413 u[IU]/mL (ref 0.350–4.500)

## 2014-01-28 LAB — GC/CHLAMYDIA PROBE AMP
CT Probe RNA: NEGATIVE
GC Probe RNA: NEGATIVE

## 2014-01-30 ENCOUNTER — Telehealth: Payer: Self-pay | Admitting: Adult Health

## 2014-01-30 NOTE — Telephone Encounter (Signed)
Left message labs normal

## 2014-02-02 ENCOUNTER — Telehealth: Payer: Self-pay | Admitting: Adult Health

## 2014-02-02 ENCOUNTER — Ambulatory Visit (INDEPENDENT_AMBULATORY_CARE_PROVIDER_SITE_OTHER): Payer: BC Managed Care – PPO | Admitting: Adult Health

## 2014-02-02 ENCOUNTER — Encounter: Payer: Self-pay | Admitting: Adult Health

## 2014-02-02 VITALS — BP 140/90 | Ht 66.0 in | Wt 286.0 lb

## 2014-02-02 DIAGNOSIS — R8789 Other abnormal findings in specimens from female genital organs: Secondary | ICD-10-CM

## 2014-02-02 DIAGNOSIS — IMO0002 Reserved for concepts with insufficient information to code with codable children: Secondary | ICD-10-CM

## 2014-02-02 HISTORY — DX: Reserved for concepts with insufficient information to code with codable children: IMO0002

## 2014-02-02 NOTE — Patient Instructions (Signed)
Abnormal Pap Test Information During a Pap test, the cells on the surface of your cervix are checked to see if they look normal, abnormal, or if they show signs of having been altered by a certain type of virus called human papillomavirus, or HPV. Cervical cells that have been affected by HPV are called dysplasia. Dysplasia is not cancer, but describes abnormal cells found on the surface of the cervix. Depending on the degree of dysplasia, some of the cells may be considered pre-cancerous and may turn into cancer over time if follow up with a caregiver is delayed.  WHAT DOES AN ABNORMAL PAP TEST MEAN? Having an abnormal pap test does not mean that you have cancer. However, certain types of abnormal pap tests can be a sign that a person is at a higher risk of developing cancer. Your caregiver will want to do other tests to find out more about the abnormal cells. Your abnormal Pap test results could show:   Small and uncertain changes that should be carefully watched.   Cervical dysplasia that has caused mild changes and can be followed over time.  Cervical dysplasia that is more severe and needs to be followed and treated to ensure the problem goes away.  Cancer.  When severe cervical dysplasia is found and treated early, it rarely will grow into cancer.  WHAT WILL BE DONE ABOUT MY ABNORMAL PAP TEST?  A colposcopy may be needed. This is a procedure where your cervix is examined using light and magnification.  A small tissue sample of your cervix (biopsy) may need to be removed and then examined. This is often performed if there are areas that appear infected.  A sample of cells from the cervical canal may be removed with either a small brush or scraping instrument (curette). Based on the results of the procedures above, some caregivers may recommend either cryotherapy of the cervix or a surgical LEEP where a portion of the cervix is removed. LEEP is short for "loop electrical excisional  procedure." Rarely, a caregiver may recommend a cone biopsy.This is a procedure where a small, cone-shaped sample of your cervix is taken out. The part that is taken out is the area where the abnormal cells are.  WHAT IF I HAVE A DYSPLASIA OR A CANCER? You may be referred to a specialist. Radiation may also be a treatment for more advanced cancer. Having a hysterectomy is the last treatment option for dysplasia, but it is a more common treatment for someone with cancer. All treatment options will be discussed with you by your caregiver. WHAT SHOULD YOU DO AFTER BEING TREATED? If you have had an abnormal pap test, you should continue to have regular pap tests and check-ups as directed by your caregiver. Your cervical problem will be carefully watched so it does not get worse. Also, your caregiver can watch for, and treat, any new problems that may come up. Document Released: 04/01/2011 Document Revised: 04/11/2013 Document Reviewed: 12/11/2011 [facility]Exit[facility] Patient Information 2014 [place], [place]. Return in 2 weeks for colpo with Dr Emelda Fearferguson

## 2014-02-02 NOTE — Telephone Encounter (Signed)
Left message to call about pap 

## 2014-02-02 NOTE — Progress Notes (Signed)
Subjective:     Patient ID: Barbara Jennings, female   DOB: 01/22/1981, 33 y.o.   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   Review of Systems See HPI Reviewed past medical,surgical, social and family history. Reviewed medications and allergies.     Objective:   Physical Exam BP 140/90  Ht 5\' 6"  (1.676 m)  Wt 286 lb (129.729 kg)  BMI 46.18 kg/m2   Reviewed pap with pt and colpo and I gave her handouts by Gaylyn RongKrames that has illustrations and explains abnormal pap and colpo in greater detail.  Assessment:     Abnormal pap, ASCUS +HPV    Plan:    Keep appt Monday for US as scheduled for pelvic pain Return in 2 weeks for colpo with Dr Emelda FearFerguson Review handouts

## 2014-02-06 ENCOUNTER — Ambulatory Visit (INDEPENDENT_AMBULATORY_CARE_PROVIDER_SITE_OTHER): Payer: Medicaid Other

## 2014-02-06 ENCOUNTER — Ambulatory Visit (INDEPENDENT_AMBULATORY_CARE_PROVIDER_SITE_OTHER): Payer: Medicaid Other | Admitting: Adult Health

## 2014-02-06 ENCOUNTER — Encounter: Payer: Self-pay | Admitting: Adult Health

## 2014-02-06 VITALS — BP 126/78 | Ht 66.0 in | Wt 286.0 lb

## 2014-02-06 DIAGNOSIS — N949 Unspecified condition associated with female genital organs and menstrual cycle: Secondary | ICD-10-CM

## 2014-02-06 DIAGNOSIS — N83209 Unspecified ovarian cyst, unspecified side: Secondary | ICD-10-CM | POA: Insufficient documentation

## 2014-02-06 HISTORY — DX: Unspecified ovarian cyst, unspecified side: N83.209

## 2014-02-06 NOTE — Progress Notes (Signed)
Subjective:     Patient ID: Barbara Jennings, female   DOB: 05/28/1981, 33 y.o.   MRN: [id number]  HPI Barbara Jennings is back today for US to evaluate pelvic pain. Has recently had abnormal pap and has colpo appt 2/19.and she has implanon that needs removing, discussed trying the pill, which will also shut the ovary down.  Review of Systems See HPI Reviewed past medical,surgical, social and family history. Reviewed medications and allergies.     Objective:   Physical Exam BP 126/78  Ht 5\' 6"  (1.676 m)  Wt 286 lb (129.729 kg)  BMI 46.18 kg/m2   Reviewed US with pt Uterus 8.3 x 5.7 x 5.1 cm, anteverted uterus no myometrial masses noted  Endometrium 6.4 mm, symmetrical,  Right ovary 4.2 x 3.7 x 3.3 cm, with 3.4 x 3.0 x 2.8 cm complex cystic area noted (septation noted within) tender to palp with vaginal probe  Left ovary 2.7 x 1.7 x 1.6 cm,  No free fluid noted within pelvis  Technician Comments:  Rt ovary with 3.4 x 3.0x 2.8cm complex cyst (septation noted within), anteverted uterus,Endom-6.954mm, lt ovary appears wnl, No free fluid noted within pelvis     Assessment:     Right ovarian cyst Pelvic pain    Plan:     Return in 3 days for implanon removal Review handout on ovarian cyst and contraceptive options

## 2014-02-06 NOTE — Patient Instructions (Signed)
Ovarian Cyst An ovarian cyst is a fluid-filled sac that forms on an ovary. The ovaries are small organs that produce eggs in women. Various types of cysts can form on the ovaries. Most are not cancerous. Many do not cause problems, and they often go away on their own. Some may cause symptoms and require treatment. Common types of ovarian cysts include:  Functional cysts These cysts may occur every month during the menstrual cycle. This is normal. The cysts usually go away with the next menstrual cycle if the woman does not get pregnant. Usually, there are no symptoms with a functional cyst.  Endometrioma cysts These cysts form from the tissue that lines the uterus. They are also called "chocolate cysts" because they become filled with blood that turns brown. This type of cyst can cause pain in the lower abdomen during intercourse and with your menstrual period.  Cystadenoma cysts This type develops from the cells on the outside of the ovary. These cysts can get very big and cause lower abdomen pain and pain with intercourse. This type of cyst can twist on itself, cut off its blood supply, and cause severe pain. It can also easily rupture and cause a lot of pain.  Dermoid cysts This type of cyst is sometimes found in both ovaries. These cysts may contain different kinds of body tissue, such as skin, teeth, hair, or cartilage. They usually do not cause symptoms unless they get very big.  Theca lutein cysts These cysts occur when too much of a certain hormone (human chorionic gonadotropin) is produced and overstimulates the ovaries to produce an egg. This is most common after procedures used to assist with the conception of a baby (in vitro fertilization). CAUSES   Fertility drugs can cause a condition in which multiple large cysts are formed on the ovaries. This is called ovarian hyperstimulation syndrome.  A condition called polycystic ovary syndrome can cause hormonal imbalances that can lead to  nonfunctional ovarian cysts. SIGNS AND SYMPTOMS  Many ovarian cysts do not cause symptoms. If symptoms are present, they may include:  Pelvic pain or pressure.  Pain in the lower abdomen.  Pain during sexual intercourse.  Increasing girth (swelling) of the abdomen.  Abnormal menstrual periods.  Increasing pain with menstrual periods.  Stopping having menstrual periods without being pregnant. DIAGNOSIS  These cysts are commonly found during a routine or annual pelvic exam. Tests may be ordered to find out more about the cyst. These tests may include:  Ultrasound.  X-ray of the pelvis.  CT scan.  MRI.  Blood tests. TREATMENT  Many ovarian cysts go away on their own without treatment. Your health care provider may want to check your cyst regularly for 2 3 months to see if it changes. For women in menopause, it is particularly important to monitor a cyst closely because of the higher rate of ovarian cancer in menopausal women. When treatment is needed, it may include any of the following:  A procedure to drain the cyst (aspiration). This may be done using a long needle and ultrasound. It can also be done through a laparoscopic procedure. This involves using a thin, lighted tube with a tiny camera on the end (laparoscope) inserted through a small incision.  Surgery to remove the whole cyst. This may be done using laparoscopic surgery or an open surgery involving a larger incision in the lower abdomen.  Hormone treatment or birth control pills. These methods are sometimes used to help dissolve a cyst. HOME CARE   INSTRUCTIONS   Only take over-the-counter or prescription medicines as directed by your health care provider.  Follow up with your health care provider as directed.  Get regular pelvic exams and Pap tests. SEEK MEDICAL CARE IF:   Your periods are late, irregular, or painful, or they stop.  Your pelvic pain or abdominal pain does not go away.  Your abdomen becomes  larger or swollen.  You have pressure on your bladder or trouble emptying your bladder completely.  You have pain during sexual intercourse.  You have feelings of fullness, pressure, or discomfort in your stomach.  You lose weight for no apparent reason.  You feel generally ill.  You become constipated.  You lose your appetite.  You develop acne.  You have an increase in body and facial hair.  You are gaining weight, without changing your exercise and eating habits.  You think you are pregnant. SEEK IMMEDIATE MEDICAL CARE IF:   You have increasing abdominal pain.  You feel sick to your stomach (nauseous), and you throw up (vomit).  You develop a fever that comes on suddenly.  You have abdominal pain during a bowel movement.  Your menstrual periods become heavier than usual. Document Released: 12/15/2005 Document Revised: 10/05/2013 Document Reviewed: 08/22/2013 [facility]Exit[facility] Patient Information 2014 [place]ExitCare, [place]. Return in 3 days for implanon removal Contraception Choices Contraception (birth control) is the use of any methods or devices to prevent pregnancy. Below are some methods to help avoid pregnancy. HORMONAL METHODS   Contraceptive implant This is a thin, plastic tube containing progesterone hormone. It does not contain estrogen hormone. Your health care provider inserts the tube in the inner part of the upper arm. The tube can remain in place for up to 3 years. After 3 years, the implant must be removed. The implant prevents the ovaries from releasing an egg (ovulation), thickens the cervical mucus to prevent sperm from entering the uterus, and thins the lining of the inside of the uterus.  Progesterone-only injections These injections are given every 3 months by your health care provider to prevent pregnancy. This synthetic progesterone hormone stops the ovaries from releasing eggs. It also thickens cervical mucus and changes the uterine lining. This makes it harder  for sperm to survive in the uterus.  Birth control pills These pills contain estrogen and progesterone hormone. They work by preventing the ovaries from releasing eggs (ovulation). They also cause the cervical mucus to thicken, preventing the sperm from entering the uterus. Birth control pills are prescribed by a health care provider.Birth control pills can also be used to treat heavy periods.  Minipill This type of birth control pill contains only the progesterone hormone. They are taken every day of each month and must be prescribed by your health care provider.  Birth control patch The patch contains hormones similar to those in birth control pills. It must be changed once a week and is prescribed by a health care provider.  Vaginal ring The ring contains hormones similar to those in birth control pills. It is left in the vagina for 3 weeks, removed for 1 week, and then a new one is put back in place. The patient must be comfortable inserting and removing the ring from the vagina.A health care provider's prescription is necessary.  Emergency contraception Emergency contraceptives prevent pregnancy after unprotected sexual intercourse. This pill can be taken right after sex or up to 5 days after unprotected sex. It is most effective the sooner you take the pills after having sexual  intercourse. Most emergency contraceptive pills are available without a prescription. Check with your pharmacist. Do not use emergency contraception as your only form of birth control. BARRIER METHODS   Female condom This is a thin sheath (latex or rubber) that is worn over the penis during sexual intercourse. It can be used with spermicide to increase effectiveness.  Female condom. This is a soft, loose-fitting sheath that is put into the vagina before sexual intercourse.  Diaphragm This is a soft, latex, dome-shaped barrier that must be fitted by a health care provider. It is inserted into the vagina, along with a  spermicidal jelly. It is inserted before intercourse. The diaphragm should be left in the vagina for 6 to 8 hours after intercourse.  Cervical cap This is a round, soft, latex or plastic cup that fits over the cervix and must be fitted by a health care provider. The cap can be left in place for up to 48 hours after intercourse.  Sponge This is a soft, circular piece of polyurethane foam. The sponge has spermicide in it. It is inserted into the vagina after wetting it and before sexual intercourse.  Spermicides These are chemicals that kill or block sperm from entering the cervix and uterus. They come in the form of creams, jellies, suppositories, foam, or tablets. They do not require a prescription. They are inserted into the vagina with an applicator before having sexual intercourse. The process must be repeated every time you have sexual intercourse. INTRAUTERINE CONTRACEPTION  Intrauterine device (IUD) This is a T-shaped device that is put in a woman's uterus during a menstrual period to prevent pregnancy. There are 2 types:  Copper IUD This type of IUD is wrapped in copper wire and is placed inside the uterus. Copper makes the uterus and fallopian tubes produce a fluid that kills sperm. It can stay in place for 10 years.  Hormone IUD This type of IUD contains the hormone progestin (synthetic progesterone). The hormone thickens the cervical mucus and prevents sperm from entering the uterus, and it also thins the uterine lining to prevent implantation of a fertilized egg. The hormone can weaken or kill the sperm that get into the uterus. It can stay in place for 3 5 years, depending on which type of IUD is used. PERMANENT METHODS OF CONTRACEPTION  Female tubal ligation This is when the woman's fallopian tubes are surgically sealed, tied, or blocked to prevent the egg from traveling to the uterus.  Hysteroscopic sterilization This involves placing a small coil or insert into each fallopian tube.  Your doctor uses a technique called hysteroscopy to do the procedure. The device causes scar tissue to form. This results in permanent blockage of the fallopian tubes, so the sperm cannot fertilize the egg. It takes about 3 months after the procedure for the tubes to become blocked. You must use another form of birth control for these 3 months.  Female sterilization This is when the female has the tubes that carry sperm tied off (vasectomy).This blocks sperm from entering the vagina during sexual intercourse. After the procedure, the man can still ejaculate fluid (semen). NATURAL PLANNING METHODS  Natural family planning This is not having sexual intercourse or using a barrier method (condom, diaphragm, cervical cap) on days the woman could become pregnant.  Calendar method This is keeping track of the length of each menstrual cycle and identifying when you are fertile.  Ovulation method This is avoiding sexual intercourse during ovulation.  Symptothermal method This is avoiding sexual  intercourse during ovulation, using a thermometer and ovulation symptoms.  Post ovulation method This is timing sexual intercourse after you have ovulated. Regardless of which type or method of contraception you choose, it is important that you use condoms to protect against the transmission of sexually transmitted infections (STIs). Talk with your health care provider about which form of contraception is most appropriate for you. Document Released: 12/15/2005 Document Revised: 08/17/2013 Document Reviewed: 06/09/2013 [facility] Patient Information 2014 [place], [place].

## 2014-02-09 ENCOUNTER — Ambulatory Visit (INDEPENDENT_AMBULATORY_CARE_PROVIDER_SITE_OTHER): Payer: Medicaid Other | Admitting: Adult Health

## 2014-02-09 ENCOUNTER — Encounter: Payer: Self-pay | Admitting: Adult Health

## 2014-02-09 VITALS — BP 100/70 | Wt 283.0 lb

## 2014-02-09 DIAGNOSIS — Z8669 Personal history of other diseases of the nervous system and sense organs: Secondary | ICD-10-CM | POA: Insufficient documentation

## 2014-02-09 DIAGNOSIS — Z3202 Encounter for pregnancy test, result negative: Secondary | ICD-10-CM

## 2014-02-09 DIAGNOSIS — Z309 Encounter for contraceptive management, unspecified: Secondary | ICD-10-CM | POA: Insufficient documentation

## 2014-02-09 DIAGNOSIS — Z3046 Encounter for surveillance of implantable subdermal contraceptive: Secondary | ICD-10-CM

## 2014-02-09 HISTORY — DX: Personal history of other diseases of the nervous system and sense organs: Z86.69

## 2014-02-09 LAB — POCT URINE PREGNANCY: Preg Test, Ur: NEGATIVE

## 2014-02-09 MED ORDER — NORETHINDRONE 0.35 MG PO TABS: 1.0000 | ORAL_TABLET | Freq: Every day | ORAL | Status: DC

## 2014-02-09 NOTE — Progress Notes (Signed)
Subjective:     Patient ID: Barbara Jennings, female   DOB: 03/26/1981, 33 y.o.   MRN: [id number]  HPI Barbara Jennings is here for implanon removal and get on OCs.Has history of migraines with ?aura, will rx POP.  Review of Systems See HPI Reviewed past medical,surgical, social and family history. Reviewed medications and allergies.     Objective:   Physical Exam BP 100/70  Wt 283 lb (128.368 kg)UPT negative, verbal consent obtained,Left arm cleansed with betadine, and injected with 1.5 cc 2% lidocaine and waited til numb.Under sterile technique a #11 blade was used to make small vertical incision, and a curved forceps was used to easily remove rod. Steri strips applied. Pressure dressing applied.    Assessment:     Implanon removal Contraceptive management Hx migraines with ?aura    Plan:     Use condoms x 4 weeks, keep clean and dry x 24 hours, no heavy lifting, keep steri strips on x 72 hours, Keep pressure dressing on x 24 hours. Follow up prn problems.   Rx micronor 1 daily x 1 year start today Keep appt next week for colpo

## 2014-02-09 NOTE — Patient Instructions (Signed)
Use condoms x 4 weeks, keep clean and dry x 24 hours, no heavy lifting, keep steri strips on x 72 hours, Keep pressure dressing on x 24 hours. Follow up prn problems. Keep appt next week

## 2014-02-16 ENCOUNTER — Encounter: Payer: Self-pay | Admitting: Obstetrics & Gynecology

## 2014-02-16 ENCOUNTER — Ambulatory Visit (INDEPENDENT_AMBULATORY_CARE_PROVIDER_SITE_OTHER): Payer: Medicaid Other | Admitting: Obstetrics & Gynecology

## 2014-02-16 VITALS — BP 120/80 | Wt 284.0 lb

## 2014-02-16 DIAGNOSIS — R87610 Atypical squamous cells of undetermined significance on cytologic smear of cervix (ASC-US): Secondary | ICD-10-CM

## 2014-02-16 DIAGNOSIS — N879 Dysplasia of cervix uteri, unspecified: Secondary | ICD-10-CM | POA: Insufficient documentation

## 2014-02-16 DIAGNOSIS — R87810 Cervical high risk human papillomavirus (HPV) DNA test positive: Secondary | ICD-10-CM

## 2014-02-16 MED ORDER — HYDROCODONE-ACETAMINOPHEN 5-325 MG PO TABS: 1.0000 | ORAL_TABLET | Freq: Four times a day (QID) | ORAL | Status: DC | PRN

## 2014-02-16 NOTE — Addendum Note (Signed)
Addended by: Lazaro ArmsEURE, Ezequias Lard H on: 02/16/2014 02:23 PM   Modules accepted: Orders

## 2014-02-16 NOTE — Progress Notes (Signed)
Patient ID: Barbara Jennings, female   DOB: 09/08/1981, 33 y.o.   MRN: [id number] Pap ASC-US  +HR HPV  Colpo Adequate No acetowhite changes No punctation No mosaicism No abnormal vessels  Normal colposcopy  Recommend Repeat Pap in 1 year per routine  Past Medical History  Diagnosis Date  . Asthma   . Anxiety   . Panic attack   . Depression   . Migraine   . Abnormal Pap smear   . HSV-2 (herpes simplex virus 2) infection   . Vaginal Pap smear, abnormal   . Unspecified symptom associated with female genital organs 01/27/2014  . 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 Other and unspecified ovarian cyst 02/06/2014  . Hx of migraines 02/09/2014    Has ?aura, will rx POP    Past Surgical History  Procedure Laterality Date  . Cesarean section    . Ovarian cyst removal    . Appendectomy    . Cholecystectomy    . Dilation and curettage of uterus      OB History   Grav Para Term Preterm Abortions TAB SAB Ect Mult Living   7 2 1 1 5  5   1       Allergies  Allergen Reactions  . Coconut Flavor Anaphylaxis    History   Social History  . Marital Status: Divorced    Spouse Name: N/A    Number of Children: N/A  . Years of Education: N/A   Social History Main Topics  . Smoking status: Current Every Day Smoker -- 0.50 packs/day for 12 years    Types: Cigarettes  . Smokeless tobacco: Never Used  . Alcohol Use: Yes     Comment: socially  . Drug Use: No  . Sexual Activity: Not Currently    Birth Control/ Protection: Implant   Other Topics Concern  . None   Social History Narrative  . None    Family History  Problem Relation Age of Onset  . Ulcers Father   . Cancer Maternal Aunt     breast   . Diabetes Maternal Grandmother   . Diabetes Maternal Grandfather   . Alzheimer's disease Paternal Grandmother   . Diabetes Maternal Aunt

## 2014-03-09 ENCOUNTER — Encounter (INDEPENDENT_AMBULATORY_CARE_PROVIDER_SITE_OTHER): Payer: Self-pay

## 2014-03-09 ENCOUNTER — Ambulatory Visit (INDEPENDENT_AMBULATORY_CARE_PROVIDER_SITE_OTHER): Payer: Medicaid Other | Admitting: Adult Health

## 2014-03-09 ENCOUNTER — Encounter: Payer: Self-pay | Admitting: Adult Health

## 2014-03-09 VITALS — BP 112/70 | Ht 66.0 in | Wt 289.0 lb

## 2014-03-09 DIAGNOSIS — N764 Abscess of vulva: Secondary | ICD-10-CM

## 2014-03-09 HISTORY — DX: Abscess of vulva: N76.4

## 2014-03-09 NOTE — Progress Notes (Signed)
Subjective:     Patient ID: Leighton Parodyracy M Reeg, female   DOB: 07/24/1981, 33 y.o.   MRN: [id number]  HPI [name] [facility] for I&D of right vulva abscess and was given Bactrum DS for 10 days.It started Sunday.  Review of Systems See HPI Reviewed past medical,surgical, social and family history. Reviewed medications and allergies.     Objective:   Physical Exam BP 112/70  Ht 5\' 6"  (1.676 m)  Wt 289 lb (131.09 kg)  BMI 46.67 kg/m2   Has swelling and healing abscess right vulva, no drainage at present  Assessment:     Right vulva abscess    Plan:     Finish bactrim ds Warm compresses and massage Return in 10 days for recheck Review handout on vulva abscess

## 2014-03-09 NOTE — Patient Instructions (Signed)
Vulvar Abscess  The vulva is made up of the large and small flaps of skin around the vagina opening. A vulvar abscess is an infected sac of yellowish white fluid (pus) in the skin flaps. Your doctor may make a small cut in the skin to drain the vulvar abscess.  HOME CARE  Only take medicine as told by your doctor.  Soak or take a warm water bath (sitz bath) 3 to 4 times a day for 15 to 20 minutes.  After you pee (urinate), always wipe from front to back.  Clean the vulvar abscess with soap and warm water. Do this after going to the bathroom.  Wear loose-fitting clothing.  Do not have sex until the vulvar abscess is gone or as told by your doctor. GET HELP RIGHT AWAY IF:   You have a temperature by mouth above 102 F (38.9 C).  The vulva area becomes more painful, puffy (swollen), or red.  You have fluid coming from the vulva area that is red or tan.  You have pain when you pee or have a hard time peeing. MAKE SURE YOU:  Understand these instructions.  Will watch your condition.  Will get help if you are not doing well or get worse. Document Released: 03/13/2009 Document Revised: 03/08/2012 Document Reviewed: 03/13/2009 [facility] Patient Information 2014 [place], [place]. Take meds Follow up in 1 0 days

## 2014-03-17 ENCOUNTER — Ambulatory Visit: Payer: Medicaid Other | Admitting: Adult Health

## 2014-03-26 ENCOUNTER — Emergency Department (HOSPITAL_COMMUNITY)
Admission: EM | Admit: 2014-03-26 | Discharge: 2014-03-27 | Disposition: A | Discharge status: Home / Self Care | Payer: Medicaid Other | Attending: Emergency Medicine | Admitting: Emergency Medicine

## 2014-03-26 ENCOUNTER — Encounter (HOSPITAL_COMMUNITY): Payer: Self-pay | Admitting: Emergency Medicine

## 2014-03-26 DIAGNOSIS — Z8659 Personal history of other mental and behavioral disorders: Secondary | ICD-10-CM | POA: Insufficient documentation

## 2014-03-26 DIAGNOSIS — F17200 Nicotine dependence, unspecified, uncomplicated: Secondary | ICD-10-CM | POA: Insufficient documentation

## 2014-03-26 DIAGNOSIS — J45909 Unspecified asthma, uncomplicated: Secondary | ICD-10-CM | POA: Insufficient documentation

## 2014-03-26 DIAGNOSIS — Z8619 Personal history of other infectious and parasitic diseases: Secondary | ICD-10-CM | POA: Insufficient documentation

## 2014-03-26 DIAGNOSIS — G43909 Migraine, unspecified, not intractable, without status migrainosus: Secondary | ICD-10-CM

## 2014-03-26 DIAGNOSIS — Z8742 Personal history of other diseases of the female genital tract: Secondary | ICD-10-CM | POA: Insufficient documentation

## 2014-03-26 DIAGNOSIS — Z79899 Other long term (current) drug therapy: Secondary | ICD-10-CM | POA: Insufficient documentation

## 2014-03-26 MED ORDER — MAGNESIUM SULFATE 40 MG/ML IJ SOLN
2.0000 g | Freq: Once | INTRAMUSCULAR | Status: AC
  Administered 2014-03-26: 2 g via INTRAVENOUS
  Filled 2014-03-26: qty 50

## 2014-03-26 MED ORDER — DEXAMETHASONE SODIUM PHOSPHATE 4 MG/ML IJ SOLN
10.0000 mg | Freq: Once | INTRAMUSCULAR | Status: AC
  Administered 2014-03-26: 10 mg via INTRAVENOUS
  Filled 2014-03-26: qty 3

## 2014-03-26 MED ORDER — LORAZEPAM 2 MG/ML IJ SOLN
1.0000 mg | Freq: Once | INTRAMUSCULAR | Status: AC
  Administered 2014-03-26: 1 mg via INTRAVENOUS
  Filled 2014-03-26: qty 1

## 2014-03-26 MED ORDER — VALPROATE SODIUM 500 MG/5ML IV SOLN
INTRAVENOUS | Status: AC
  Filled 2014-03-26: qty 10

## 2014-03-26 MED ORDER — VALPROATE SODIUM 500 MG/5ML IV SOLN
1000.0000 mg | Freq: Once | INTRAVENOUS | Status: AC
  Administered 2014-03-26: 1000 mg via INTRAVENOUS
  Filled 2014-03-26: qty 10

## 2014-03-26 MED ORDER — DIPHENHYDRAMINE HCL 50 MG/ML IJ SOLN
25.0000 mg | Freq: Once | INTRAMUSCULAR | Status: AC
  Administered 2014-03-26: 25 mg via INTRAVENOUS
  Filled 2014-03-26: qty 1

## 2014-03-26 MED ORDER — SODIUM CHLORIDE 0.9 % IV SOLN
1000.0000 mL | INTRAVENOUS | Status: DC
  Administered 2014-03-26: 1000 mL via INTRAVENOUS

## 2014-03-26 MED ORDER — KETOROLAC TROMETHAMINE 30 MG/ML IJ SOLN
30.0000 mg | Freq: Once | INTRAMUSCULAR | Status: AC
  Administered 2014-03-26: 30 mg via INTRAVENOUS
  Filled 2014-03-26: qty 1

## 2014-03-26 MED ORDER — SODIUM CHLORIDE 0.9 % IV SOLN
1000.0000 mL | Freq: Once | INTRAVENOUS | Status: AC
  Administered 2014-03-26: 1000 mL via INTRAVENOUS

## 2014-03-26 MED ORDER — SUMATRIPTAN SUCCINATE 6 MG/0.5ML ~~LOC~~ SOLN
6.0000 mg | Freq: Once | SUBCUTANEOUS | Status: AC
  Administered 2014-03-26: 6 mg via SUBCUTANEOUS
  Filled 2014-03-26: qty 0.5

## 2014-03-26 MED ORDER — METOCLOPRAMIDE HCL 5 MG/ML IJ SOLN
10.0000 mg | Freq: Once | INTRAMUSCULAR | Status: AC
  Administered 2014-03-26: 10 mg via INTRAVENOUS
  Filled 2014-03-26: qty 2

## 2014-03-26 MED ORDER — ONDANSETRON HCL 4 MG/2ML IJ SOLN
4.0000 mg | Freq: Once | INTRAMUSCULAR | Status: AC
  Administered 2014-03-26: 4 mg via INTRAVENOUS
  Filled 2014-03-26: qty 2

## 2014-03-26 NOTE — ED Notes (Signed)
MD at bedside. 

## 2014-03-26 NOTE — ED Notes (Signed)
Pt. States that she was feeling better until she ambulated to bathroom, then her migraine became worse again.

## 2014-03-26 NOTE — ED Provider Notes (Signed)
CSN: [id number]     Arrival date & time 03/26/14  1748 History   First MD Initiated Contact with Patient 03/26/14 1944     Chief Complaint  Patient presents with  . Migraine     (Consider location/radiation/quality/duration/timing/severity/associated sxs/prior Treatment) HPI Patient has a history of headaches. She states she got her usual headache about 3 days ago. It is right-sided and described as throbbing. She has had nausea with vomiting about 4 times. She has photophobia and noise sensitivity. She states she took her Imitrex yesterday without any relief.  PCP Dr Phillips Odor  Past Medical History  Diagnosis Date  . Asthma   . Anxiety   . Panic attack   . Depression   . Migraine   . Abnormal Pap smear   . HSV-2 (herpes simplex virus 2) infection   . Vaginal Pap smear, abnormal   . Unspecified symptom associated with female genital organs 01/27/2014  . 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 Other and unspecified ovarian cyst 02/06/2014  . Hx of migraines 02/09/2014    Has ?aura, will rx POP  . Vulvar abscess 03/09/2014    I&D 3/9 in ER at [facility] was rx'd bactrim ds x 10 days   Past Surgical History  Procedure Laterality Date  . Cesarean section    . Ovarian cyst removal    . Appendectomy    . Cholecystectomy    . Dilation and curettage of uterus     Family History  Problem Relation Age of Onset  . Ulcers Father   . Cancer Maternal Aunt     breast   . Diabetes Maternal Grandmother   . Diabetes Maternal Grandfather   . Alzheimer's disease Paternal Grandmother   . Diabetes Maternal Aunt    History  Substance Use Topics  . Smoking status: Current Every Day Smoker -- 0.50 packs/day for 12 years    Types: Cigarettes  . Smokeless tobacco: Never Used  . Alcohol Use: Yes     Comment: socially   Student employed  OB History   Grav Para Term Preterm Abortions TAB SAB Ect  Mult Living   7 2 1 1 5  5   1      Review of Systems  All other systems reviewed and are negative.      Allergies  Coconut flavor  Home Medications   Current Outpatient Rx  Name  Route  Sig  Dispense  Refill  . HYDROcodone-acetaminophen (NORCO/VICODIN) 5-325 MG per tablet   Oral   Take 1 tablet by mouth every 6 (six) hours as needed for moderate pain.   15 tablet   0   . norethindrone (MICRONOR,CAMILA,ERRIN) 0.35 MG tablet   Oral   Take 1 tablet (0.35 mg total) by mouth daily.   1 Package   11    Imitrex yesterday   BP 136/76  Pulse 92  Temp(Src) 98 F (36.7 C) (Oral)  Resp 20  Ht 5\' 6"  (1.676 m)  Wt 243 lb (110.224 kg)  BMI 39.24 kg/m2  SpO2 100%  Vital signs normal   Physical Exam  Nursing note and vitals reviewed. Constitutional: She is oriented to person, place, and time. She appears well-developed and well-nourished.  Non-toxic appearance. She does not appear ill. She appears distressed.  Patient wearing sunglasses in a dark room however the TV is on  HENT:  Head: Normocephalic and atraumatic.  Right Ear: External ear normal.  Left Ear: External ear normal.  Nose: Nose normal. No mucosal edema or rhinorrhea.  Mouth/Throat: Oropharynx is clear and moist and mucous membranes are normal. No dental abscesses or uvula swelling.  Eyes: Conjunctivae and EOM are normal. Pupils are equal, round, and reactive to light.  Neck: Normal range of motion and full passive range of motion without pain. Neck supple.  Cardiovascular: Normal rate, regular rhythm and normal heart sounds.  Exam reveals no gallop and no friction rub.   No murmur heard. Pulmonary/Chest: Effort normal and breath sounds normal. No respiratory distress. She has no wheezes. She has no rhonchi. She has no rales. She exhibits no tenderness and no crepitus.  Abdominal: Soft. Normal appearance and bowel sounds are normal. She exhibits no distension. There is no tenderness. There is no rebound and no  guarding.  Musculoskeletal: Normal range of motion. She exhibits no edema and no tenderness.  Moves all extremities well.   Neurological: She is alert and oriented to person, place, and time. She has normal strength. No cranial nerve deficit.  Skin: Skin is warm, dry and intact. No rash noted. No erythema. No pallor.  Psychiatric: She has a normal mood and affect. Her speech is normal and behavior is normal. Her mood appears not anxious.    ED Course  Procedures (including critical care time)  Medications  0.9 %  sodium chloride infusion (0 mLs Intravenous Stopped 03/26/14 2143)    Followed by  0.9 %  sodium chloride infusion (not administered)  valproate (DEPACON) 1,000 mg in dextrose 5 % 50 mL IVPB (not administered)  ketorolac (TORADOL) 30 MG/ML injection 30 mg (not administered)  metoCLOPramide (REGLAN) injection 10 mg (10 mg Intravenous Given 03/26/14 2044)  diphenhydrAMINE (BENADRYL) injection 25 mg (25 mg Intravenous Given 03/26/14 2042)  LORazepam (ATIVAN) injection 1 mg (1 mg Intravenous Given 03/26/14 2041)  dexamethasone (DECADRON) injection 10 mg (10 mg Intravenous Given 03/26/14 2037)  SUMAtriptan (IMITREX) injection 6 mg (6 mg Subcutaneous Given 03/26/14 2150)  magnesium sulfate IVPB 2 g 50 mL (0 g Intravenous Stopped 03/26/14 2209)  ondansetron (ZOFRAN) injection 4 mg (4 mg Intravenous Given 03/26/14 2145)    Patient has been seen for migraine headache 6 times since March 2014  21:30 pt sleeping, hard to wake up, initially said her headache was better and had mild nausea, then said her headache was only mildly better. She hasn't had the imitrex in almost 24 hrs, will try it now.   22:20 patient was ambulatory to the bathroom. She states her headache was improved until she got up to walk in her headache returned. We discussed adding Depacon Toradol and then she should go home and sleep.  Labs Review Labs Reviewed - No data to display Imaging Review No results found.   EKG  Interpretation None      MDM   Final diagnoses:  Migraine     Plan discharge  Devoria AlbeIva Aiyonna Lucado, MD, Franz DellFACEP    Lionell Matuszak L Jaidin Ugarte, MD 03/26/14 2222

## 2014-03-26 NOTE — ED Notes (Addendum)
Pt. Reports migraine starting 3 days ago. Pt. Reports nausea/vomiting. Pt. Reports taking Excedrin, Imitrex, and half a Vicodin at home with no relief. Pt. Reports light sensitivity and "seeing black spots". Pt. States that she does not know what she has been given at ED previously to relief headache, but the "headache cocktail does not work on me".

## 2014-03-26 NOTE — ED Notes (Signed)
Migraine x 3 days with n/v/light, sound sensitivity.  Pt also reports seeing "black spots".

## 2014-03-26 NOTE — ED Notes (Signed)
Pt. Ambulate independently to bathroom.

## 2014-03-26 NOTE — Discharge Instructions (Signed)
Recheck as needed.  Migraine Headache A migraine headache is an intense, throbbing pain on one or both sides of your head. A migraine can last for 30 minutes to several hours. CAUSES  The exact cause of a migraine headache is not always known. However, a migraine may be caused when nerves in the brain become irritated and release chemicals that cause inflammation. This causes pain. Certain things may also trigger migraines, such as:  Alcohol.  Smoking.  Stress.  Menstruation.  Aged cheeses.  Foods or drinks that contain nitrates, glutamate, aspartame, or tyramine.  Lack of sleep.  Chocolate.  Caffeine.  Hunger.  Physical exertion.  Fatigue.  Medicines used to treat chest pain (nitroglycerine), birth control pills, estrogen, and some blood pressure medicines. SIGNS AND SYMPTOMS  Pain on one or both sides of your head.  Pulsating or throbbing pain.  Severe pain that prevents daily activities.  Pain that is aggravated by any physical activity.  Nausea, vomiting, or both.  Dizziness.  Pain with exposure to bright lights, loud noises, or activity.  General sensitivity to bright lights, loud noises, or smells. Before you get a migraine, you may get warning signs that a migraine is coming (aura). An aura may include:  Seeing flashing lights.  Seeing bright spots, halos, or zig-zag lines.  Having tunnel vision or blurred vision.  Having feelings of numbness or tingling.  Having trouble talking.  Having muscle weakness. DIAGNOSIS  A migraine headache is often diagnosed based on:  Symptoms.  Physical exam.  A CT scan or MRI of your head. These imaging tests cannot diagnose migraines, but they can help rule out other causes of headaches. TREATMENT Medicines may be given for pain and nausea. Medicines can also be given to help prevent recurrent migraines.  HOME CARE INSTRUCTIONS  Only take over-the-counter or prescription medicines for pain or discomfort  as directed by your health care provider. The use of long-term narcotics is not recommended.  Lie down in a dark, quiet room when you have a migraine.  Keep a journal to find out what may trigger your migraine headaches. For example, write down:  What you eat and drink.  How much sleep you get.  Any change to your diet or medicines.  Limit alcohol consumption.  Quit smoking if you smoke.  Get 7 9 hours of sleep, or as recommended by your health care provider.  Limit stress.  Keep lights dim if bright lights bother you and make your migraines worse. SEEK IMMEDIATE MEDICAL CARE IF:   Your migraine becomes severe.  You have a fever.  You have a stiff neck.  You have vision loss.  You have muscular weakness or loss of muscle control.  You start losing your balance or have trouble walking.  You feel faint or pass out.  You have severe symptoms that are different from your first symptoms. MAKE SURE YOU:   Understand these instructions.  Will watch your condition.  Will get help right away if you are not doing well or get worse. Document Released: 12/15/2005 Document Revised: 10/05/2013 Document Reviewed: 08/22/2013 [facility] Patient Information 2014 [place] SpringsExitCare, [place]LL[place].

## 2014-05-20 ENCOUNTER — Emergency Department (HOSPITAL_COMMUNITY)
Admission: EM | Admit: 2014-05-20 | Discharge: 2014-05-20 | Disposition: A | Discharge status: Home / Self Care | Payer: Medicaid Other | Attending: Emergency Medicine | Admitting: Emergency Medicine

## 2014-05-20 ENCOUNTER — Encounter (HOSPITAL_COMMUNITY): Payer: Self-pay | Admitting: Emergency Medicine

## 2014-05-20 ENCOUNTER — Emergency Department (HOSPITAL_COMMUNITY): Payer: Medicaid Other

## 2014-05-20 DIAGNOSIS — Z8742 Personal history of other diseases of the female genital tract: Secondary | ICD-10-CM | POA: Insufficient documentation

## 2014-05-20 DIAGNOSIS — Z8619 Personal history of other infectious and parasitic diseases: Secondary | ICD-10-CM | POA: Insufficient documentation

## 2014-05-20 DIAGNOSIS — Z8679 Personal history of other diseases of the circulatory system: Secondary | ICD-10-CM | POA: Insufficient documentation

## 2014-05-20 DIAGNOSIS — S93401A Sprain of unspecified ligament of right ankle, initial encounter: Secondary | ICD-10-CM

## 2014-05-20 DIAGNOSIS — J45909 Unspecified asthma, uncomplicated: Secondary | ICD-10-CM | POA: Insufficient documentation

## 2014-05-20 DIAGNOSIS — S93409A Sprain of unspecified ligament of unspecified ankle, initial encounter: Secondary | ICD-10-CM | POA: Insufficient documentation

## 2014-05-20 DIAGNOSIS — Y929 Unspecified place or not applicable: Secondary | ICD-10-CM | POA: Insufficient documentation

## 2014-05-20 DIAGNOSIS — Y9389 Activity, other specified: Secondary | ICD-10-CM | POA: Insufficient documentation

## 2014-05-20 DIAGNOSIS — Z8659 Personal history of other mental and behavioral disorders: Secondary | ICD-10-CM | POA: Insufficient documentation

## 2014-05-20 DIAGNOSIS — X500XXA Overexertion from strenuous movement or load, initial encounter: Secondary | ICD-10-CM | POA: Insufficient documentation

## 2014-05-20 DIAGNOSIS — F17200 Nicotine dependence, unspecified, uncomplicated: Secondary | ICD-10-CM | POA: Insufficient documentation

## 2014-05-20 MED ORDER — IBUPROFEN 800 MG PO TABS
800.0000 mg | ORAL_TABLET | Freq: Once | ORAL | Status: AC
  Administered 2014-05-20: 800 mg via ORAL
  Filled 2014-05-20: qty 1

## 2014-05-20 MED ORDER — OXYCODONE-ACETAMINOPHEN 5-325 MG PO TABS
1.0000 | ORAL_TABLET | Freq: Once | ORAL | Status: AC
  Administered 2014-05-20: 1 via ORAL
  Filled 2014-05-20: qty 1

## 2014-05-20 MED ORDER — IBUPROFEN 600 MG PO TABS: 600.0000 mg | ORAL_TABLET | Freq: Three times a day (TID) | ORAL | Status: DC | PRN

## 2014-05-20 NOTE — Discharge Instructions (Signed)

## 2014-05-20 NOTE — ED Provider Notes (Signed)
CSN: [id number]     Arrival date & time 05/20/14  2104 History  This chart was scribed for Lyanne Co, MD by Quintella Reichert, ED scribe.  This patient was seen in room APA03/APA03 and the patient's care was started at 9:25 PM.   Chief Complaint  Patient presents with  . Ankle Injury    The history is provided by the patient. No language interpreter was used.    HPI Comments: Barbara Jennings is a 33 y.o. female who presents to the Emergency Department complaining of a right ankle injury sustained earlier today.  Pt states she stepped in a hole and rolled the ankle in an inversion injury.  She had immediate onset of constant, moderate-to-severe pain to that ankle with associated swelling and bruising.  She attempted to treat pain with 400mg  ibuprofen at 4 PM.   Past Medical History  Diagnosis Date  . Asthma   . Anxiety   . Panic attack   . Depression   . Migraine   . Abnormal Pap smear   . HSV-2 (herpes simplex virus 2) infection   . Vaginal Pap smear, abnormal   . Unspecified symptom associated with female genital organs 01/27/2014  . 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 Other and unspecified ovarian cyst 02/06/2014  . Hx of migraines 02/09/2014    Has ?aura, will rx POP  . Vulvar abscess 03/09/2014    I&D 3/9 in ER at [facility] was rx'd bactrim ds x 10 days    Past Surgical History  Procedure Laterality Date  . Cesarean section    . Ovarian cyst removal    . Appendectomy    . Cholecystectomy    . Dilation and curettage of uterus      Family History  Problem Relation Age of Onset  . Ulcers Father   . Cancer Maternal Aunt     breast   . Diabetes Maternal Grandmother   . Diabetes Maternal Grandfather   . Alzheimer's disease Paternal Grandmother   . Diabetes Maternal Aunt     History  Substance Use Topics  . Smoking status: Current Every Day Smoker -- 0.50 packs/day  for 12 years    Types: Cigarettes  . Smokeless tobacco: Never Used  . Alcohol Use: Yes     Comment: socially    OB History   Grav Para Term Preterm Abortions TAB SAB Ect Mult Living   7 2 1 1 5  5   1        Review of Systems A complete 10 system review of systems was obtained and all systems are negative except as noted in the HPI and PMH.     Allergies  Coconut flavor  Home Medications   Prior to Admission medications   Not on File   BP 118/60  Pulse 97  Temp(Src) 98.2 F (36.8 C)  Resp 20  Ht 5\' 6"  (1.676 m)  Wt 262 lb (118.842 kg)  BMI 42.31 kg/m2  SpO2 98%  Physical Exam  Nursing note and vitals reviewed. Constitutional: She is oriented to person, place, and time. She appears well-developed and well-nourished. No distress.  HENT:  Head: Normocephalic and atraumatic.  Eyes: EOM are normal.  Neck: Normal range of motion.  Cardiovascular: Normal rate, regular rhythm and normal heart sounds.   Normal PT and DP pulse.  Pulmonary/Chest: Effort normal and breath sounds normal.  Abdominal: Soft. She exhibits no distension. There is no tenderness.  Musculoskeletal:  Tenderness over medial and lateral right malleolus.  No obvious deformity.  Neurological: She is alert and oriented to person, place, and time.  Skin: Skin is warm and dry.  Psychiatric: She has a normal mood and affect. Judgment normal.    ED Course  Procedures (including critical care time)  DIAGNOSTIC STUDIES: Oxygen Saturation is 98% on room air, normal by my interpretation.    COORDINATION OF CARE: 9:27 PM-Discussed treatment plan which includes pain medication and x-ray with pt at bedside and pt agreed to plan.     Labs Review Labs Reviewed - No data to display  Imaging Review No results found.   EKG Interpretation None      MDM   Final diagnoses:  None    Ankle sprain. Compartments soft    I personally performed the services described in this documentation, which was  scribed in my presence. The recorded information has been reviewed and is accurate.     Lyanne CoKevin M Florencio Hollibaugh, MD 05/20/14 2213

## 2014-05-20 NOTE — ED Notes (Signed)
[place] and rolled her right ankle. Ankle is swollen and bruised. Pt also c/o some right wrist pain

## 2014-05-25 ENCOUNTER — Ambulatory Visit (HOSPITAL_COMMUNITY)
Admission: RE | Admit: 2014-05-25 | Discharge: 2014-05-25 | Disposition: A | Discharge status: Home / Self Care | Payer: Medicaid Other | Source: Ambulatory Visit | Attending: Physician Assistant | Admitting: Physician Assistant

## 2014-05-25 ENCOUNTER — Other Ambulatory Visit (HOSPITAL_COMMUNITY): Payer: Self-pay | Admitting: Physician Assistant

## 2014-05-25 DIAGNOSIS — M7989 Other specified soft tissue disorders: Secondary | ICD-10-CM | POA: Insufficient documentation

## 2014-05-25 DIAGNOSIS — S93409A Sprain of unspecified ligament of unspecified ankle, initial encounter: Secondary | ICD-10-CM

## 2014-05-25 DIAGNOSIS — M25579 Pain in unspecified ankle and joints of unspecified foot: Secondary | ICD-10-CM | POA: Insufficient documentation

## 2014-06-17 ENCOUNTER — Emergency Department (HOSPITAL_COMMUNITY): Payer: Medicaid Other

## 2014-06-17 ENCOUNTER — Emergency Department (HOSPITAL_COMMUNITY)
Admission: EM | Admit: 2014-06-17 | Discharge: 2014-06-17 | Disposition: A | Discharge status: Home / Self Care | Payer: Medicaid Other | Attending: Emergency Medicine | Admitting: Emergency Medicine

## 2014-06-17 ENCOUNTER — Encounter (HOSPITAL_COMMUNITY): Payer: Self-pay | Admitting: Emergency Medicine

## 2014-06-17 DIAGNOSIS — F17200 Nicotine dependence, unspecified, uncomplicated: Secondary | ICD-10-CM | POA: Insufficient documentation

## 2014-06-17 DIAGNOSIS — Z8679 Personal history of other diseases of the circulatory system: Secondary | ICD-10-CM | POA: Insufficient documentation

## 2014-06-17 DIAGNOSIS — Z8619 Personal history of other infectious and parasitic diseases: Secondary | ICD-10-CM | POA: Insufficient documentation

## 2014-06-17 DIAGNOSIS — M25561 Pain in right knee: Secondary | ICD-10-CM

## 2014-06-17 DIAGNOSIS — Z8742 Personal history of other diseases of the female genital tract: Secondary | ICD-10-CM | POA: Insufficient documentation

## 2014-06-17 DIAGNOSIS — J45909 Unspecified asthma, uncomplicated: Secondary | ICD-10-CM | POA: Insufficient documentation

## 2014-06-17 DIAGNOSIS — M25569 Pain in unspecified knee: Secondary | ICD-10-CM | POA: Insufficient documentation

## 2014-06-17 DIAGNOSIS — Z8659 Personal history of other mental and behavioral disorders: Secondary | ICD-10-CM | POA: Insufficient documentation

## 2014-06-17 MED ORDER — IBUPROFEN 600 MG PO TABS: 600.0000 mg | ORAL_TABLET | Freq: Four times a day (QID) | ORAL | Status: DC | PRN

## 2014-06-17 MED ORDER — HYDROCODONE-ACETAMINOPHEN 5-325 MG PO TABS
1.0000 | ORAL_TABLET | Freq: Once | ORAL | Status: AC
  Administered 2014-06-17: 1 via ORAL
  Filled 2014-06-17: qty 1

## 2014-06-17 MED ORDER — HYDROCODONE-ACETAMINOPHEN 5-325 MG PO TABS: 1.0000 | ORAL_TABLET | ORAL | Status: DC | PRN

## 2014-06-17 NOTE — ED Notes (Signed)
Right knee pain started this morning, was swimming yesterday, thinks she hurt it then

## 2014-06-17 NOTE — ED Provider Notes (Signed)
CSN: [id number]     Arrival date & time 06/17/14  1448 History   First MD Initiated Contact with Patient 06/17/14 1508     Chief Complaint  Patient presents with  . Knee Pain     (Consider location/radiation/quality/duration/timing/severity/associated sxs/prior Treatment) Patient is a 33 y.o. female presenting with knee pain. The history is provided by the patient.  Knee Pain Location:  Knee Time since incident:  1 day Injury: yes   Mechanism of injury comment:  Swimming Knee location:  R knee Pain details:    Quality:  Aching and shooting   Severity:  Moderate   Onset quality:  Gradual   Timing:  Constant   Progression:  Worsening Dislocation: no   Foreign body present:  No foreign bodies Prior injury to area:  No Relieved by:  Nothing Worsened by:  Activity and bearing weight  Leighton Parodyracy M Monger is a 33 y.o. female who presents to the ED with right knee pain. The pain started after she was swimming yesterday. The pain has increased today.  Past Medical History  Diagnosis Date  . Asthma   . Anxiety   . Panic attack   . Depression   . Migraine   . Abnormal Pap smear   . HSV-2 (herpes simplex virus 2) infection   . Vaginal Pap smear, abnormal   . Unspecified symptom associated with female genital organs 01/27/2014  . 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 Other and unspecified ovarian cyst 02/06/2014  . Hx of migraines 02/09/2014    Has ?aura, will rx POP  . Vulvar abscess 03/09/2014    [facility]'d bactrim ds x 10 days   Past Surgical History  Procedure Laterality Date  . Cesarean section    . Ovarian cyst removal    . Appendectomy    . Cholecystectomy    . Dilation and curettage of uterus     Family History  Problem Relation Age of Onset  . Ulcers Father   . Cancer Maternal Aunt     breast   . Diabetes Maternal Grandmother   . Diabetes  Maternal Grandfather   . Alzheimer's disease Paternal Grandmother   . Diabetes Maternal Aunt    History  Substance Use Topics  . Smoking status: Current Every Day Smoker -- 0.50 packs/day for 12 years    Types: Cigarettes  . Smokeless tobacco: Never Used  . Alcohol Use: Yes     Comment: socially   OB History   Grav Para Term Preterm Abortions TAB SAB Ect Mult Living   7 2 1 1 5  5   1      Review of Systems Negative except as stated in HPI   Allergies  Coconut flavor  Home Medications   Prior to Admission medications   Medication Sig Start Date End Date Taking? Authorizing Provider  ibuprofen (ADVIL,MOTRIN) 600 MG tablet Take 1 tablet (600 mg total) by mouth every 8 (eight) hours as needed. 05/20/14   Lyanne CoKevin M Campos, MD   BP 135/78  Pulse 91  Temp(Src) 98.1 F (36.7 C) (Oral)  Resp 19  SpO2 97% Physical Exam  Nursing note and vitals reviewed. Constitutional: She is oriented to person, place, and time. She appears well-developed and well-nourished.  HENT:  Head: Normocephalic and atraumatic.  Eyes: EOM are normal.  Neck: Neck supple.  Cardiovascular: Normal rate.  Pulmonary/Chest: Effort normal.  Abdominal: Soft.  Musculoskeletal: Normal range of motion.       Right knee: She exhibits swelling. She exhibits normal range of motion, no ecchymosis, no deformity and normal alignment. Tenderness found. LCL tenderness noted.       Legs: Pedal pulses equal, adequate circulation, good touch sensation. There is swelling noted to the lateral aspect of the right knee and tender with flexion of the knee and internal rotation.   Neurological: She is alert and oriented to person, place, and time. No cranial nerve deficit.  Skin: Skin is warm and dry.  Psychiatric: Her behavior is normal.    ED Course  Procedures  Dg Knee Complete 4 Views Right  06/17/2014   CLINICAL DATA:  KNEE PAIN  She  EXAM: RIGHT KNEE - COMPLETE 4+ VIEW  COMPARISON:  None.  FINDINGS: There is no evidence  of fracture, dislocation, or joint effusion. There is no evidence of arthropathy or other focal bone abnormality. Soft tissues are unremarkable.  IMPRESSION: Negative.   Electronically Signed   By: Salome HolmesHector  Cooper M.D.   On: 06/17/2014 15:38    MDM  33 y.o. obese female with right knee pain after swimming yesterday. Placed in knee immobilizer, ice elevation and pain management. Stable for discharge without neurovascular compromise. discussed with the patient in detail neurovascular checks and she will return for any problems.    Medication List    TAKE these medications       HYDROcodone-acetaminophen 5-325 MG per tablet  Commonly known as:  NORCO/VICODIN  Take 1 tablet by mouth every 4 (four) hours as needed.     ibuprofen 600 MG tablet  Commonly known as:  ADVIL,MOTRIN  Take 1 tablet (600 mg total) by mouth every 6 (six) hours as needed.      ASK your doctor about these medications       HEATHER 0.35 MG tablet  Generic drug:  norethindrone  Take 1 tablet by mouth daily.          Janne NapoleonHope M Neese, TexasNP 06/18/14 1112

## 2014-06-18 NOTE — ED Provider Notes (Signed)
Medical screening examination/treatment/procedure(s) were performed by non-physician practitioner and as supervising physician I was immediately available for consultation/collaboration.   EKG Interpretation None        Saanvika Vazques L Leilanee Righetti, MD 06/18/14 2056 

## 2014-07-03 ENCOUNTER — Encounter: Payer: Self-pay | Admitting: Orthopedic Surgery

## 2014-07-03 ENCOUNTER — Ambulatory Visit (INDEPENDENT_AMBULATORY_CARE_PROVIDER_SITE_OTHER): Payer: Medicaid Other | Admitting: Orthopedic Surgery

## 2014-07-03 ENCOUNTER — Encounter (INDEPENDENT_AMBULATORY_CARE_PROVIDER_SITE_OTHER): Payer: Self-pay

## 2014-07-03 VITALS — HR 88 | Ht 66.0 in | Wt 288.0 lb

## 2014-07-03 DIAGNOSIS — S83206A Unspecified tear of unspecified meniscus, current injury, right knee, initial encounter: Secondary | ICD-10-CM

## 2014-07-03 DIAGNOSIS — S83209A Unspecified tear of unspecified meniscus, current injury, unspecified knee, initial encounter: Secondary | ICD-10-CM | POA: Insufficient documentation

## 2014-07-03 DIAGNOSIS — IMO0002 Reserved for concepts with insufficient information to code with codable children: Secondary | ICD-10-CM

## 2014-07-03 MED ORDER — IBUPROFEN 600 MG PO TABS: 600.0000 mg | ORAL_TABLET | Freq: Four times a day (QID) | ORAL | Status: DC | PRN

## 2014-07-03 MED ORDER — HYDROCODONE-ACETAMINOPHEN 5-325 MG PO TABS: 1.0000 | ORAL_TABLET | ORAL | Status: DC | PRN

## 2014-07-03 NOTE — Progress Notes (Signed)
Subjective:     Patient ID: Barbara Jennings, female   DOB: 03/03/1981, 33 y.o.   MRN: [id number] Knee injury  HPI 33 year old female was in a pulseless state causing her to lose her balance and twisted her right knee on 06/16/2014. She got of the pool had pain in her knee causes her to limp she went home woke up the next day with severe swelling. Since that time she reports sharp throbbing burning medial joint line tenderness pain with joint swelling which is constant. Pain 8/10. Associated with numbness and tingling.  Treatments up until this point she's been on ibuprofen, hydrocodone, rest, knee brace and crutches. She went to the emergency room x-rays were negative. She saw her primary care physician who sent her here for further evaluation  Review of Systems Review of systems reports vision disturbance glasses headaches diabetes and symptoms limb pain difficulty moving her leg swelling of the joint otherwise normal    Objective:   Physical Exam Pulse 88  Ht 5\' 6"  (1.676 m)  Wt 288 lb (130.636 kg)  BMI 46.51 kg/m2  Her appearance is normal. She is well-groomed. Hygiene. She's just got a large BMI. She is oriented x3. Her mood is normal. Her gait is labored and she is limping on her right leg  Upper extremities inspection normal. Joint contractures none subluxations none muscle tone normal skin normal. Pulses normal sensation normal. Lymph nodes negative.  Lower extremities left leg normal on inspection. Full range of motion. Ligament stable. Motor exam normal. Skin normal. Pulses normal. Sensation normal. Lymph nodes normal. Reflexes normal. Balance could not be assessed  Right knee small joint effusion. Medial joint line tenderness. Knee flexion 30-90 ligament stable motor exam normal skin intact. Provocative tests Lachman test normal drawer test normal McMurray sign positive    Assessment:     Medial meniscal tear    Plan:     Surgical arthroscopy right knee recommend MRI prior.  She will continue ibuprofen and hydrocodone. She'll start 100 events a day.

## 2014-07-03 NOTE — Patient Instructions (Addendum)
MRI ordered Bend knee 100 times a day with brace off

## 2014-07-13 ENCOUNTER — Ambulatory Visit (HOSPITAL_COMMUNITY): Payer: Medicaid Other

## 2014-07-17 ENCOUNTER — Encounter (HOSPITAL_COMMUNITY): Payer: Self-pay

## 2014-07-17 ENCOUNTER — Ambulatory Visit (HOSPITAL_COMMUNITY)
Admission: RE | Admit: 2014-07-17 | Discharge: 2014-07-17 | Disposition: A | Discharge status: Home / Self Care | Payer: Medicaid Other | Source: Ambulatory Visit | Attending: Orthopedic Surgery | Admitting: Orthopedic Surgery

## 2014-07-17 DIAGNOSIS — R609 Edema, unspecified: Secondary | ICD-10-CM | POA: Insufficient documentation

## 2014-07-17 DIAGNOSIS — M25569 Pain in unspecified knee: Secondary | ICD-10-CM | POA: Insufficient documentation

## 2014-07-17 DIAGNOSIS — S83206A Unspecified tear of unspecified meniscus, current injury, right knee, initial encounter: Secondary | ICD-10-CM

## 2014-07-17 DIAGNOSIS — R29898 Other symptoms and signs involving the musculoskeletal system: Secondary | ICD-10-CM | POA: Diagnosis not present

## 2014-07-29 ENCOUNTER — Encounter (HOSPITAL_COMMUNITY): Payer: Self-pay | Admitting: Emergency Medicine

## 2014-07-29 ENCOUNTER — Emergency Department (HOSPITAL_COMMUNITY)
Admission: EM | Admit: 2014-07-29 | Discharge: 2014-07-29 | Disposition: A | Discharge status: Home / Self Care | Payer: Medicaid Other | Attending: Emergency Medicine | Admitting: Emergency Medicine

## 2014-07-29 DIAGNOSIS — R112 Nausea with vomiting, unspecified: Secondary | ICD-10-CM | POA: Insufficient documentation

## 2014-07-29 DIAGNOSIS — Z3202 Encounter for pregnancy test, result negative: Secondary | ICD-10-CM | POA: Insufficient documentation

## 2014-07-29 DIAGNOSIS — Z8679 Personal history of other diseases of the circulatory system: Secondary | ICD-10-CM | POA: Insufficient documentation

## 2014-07-29 DIAGNOSIS — R51 Headache: Secondary | ICD-10-CM | POA: Diagnosis not present

## 2014-07-29 DIAGNOSIS — F17200 Nicotine dependence, unspecified, uncomplicated: Secondary | ICD-10-CM | POA: Insufficient documentation

## 2014-07-29 DIAGNOSIS — J45909 Unspecified asthma, uncomplicated: Secondary | ICD-10-CM | POA: Diagnosis not present

## 2014-07-29 DIAGNOSIS — R519 Headache, unspecified: Secondary | ICD-10-CM

## 2014-07-29 DIAGNOSIS — Z8742 Personal history of other diseases of the female genital tract: Secondary | ICD-10-CM | POA: Diagnosis not present

## 2014-07-29 DIAGNOSIS — Z79899 Other long term (current) drug therapy: Secondary | ICD-10-CM | POA: Diagnosis not present

## 2014-07-29 DIAGNOSIS — Z8659 Personal history of other mental and behavioral disorders: Secondary | ICD-10-CM | POA: Insufficient documentation

## 2014-07-29 LAB — URINALYSIS, ROUTINE W REFLEX MICROSCOPIC
Bilirubin Urine: NEGATIVE
GLUCOSE, UA: NEGATIVE mg/dL
Ketones, ur: NEGATIVE mg/dL
LEUKOCYTES UA: NEGATIVE
NITRITE: NEGATIVE
PROTEIN: NEGATIVE mg/dL
Specific Gravity, Urine: <1.005 — ABNORMAL LOW (ref 1.005–1.030)
UROBILINOGEN UA: 0.2 mg/dL (ref 0.0–1.0)
pH: 6 (ref 5.0–8.0)

## 2014-07-29 LAB — BASIC METABOLIC PANEL
ANION GAP: 14 (ref 5–15)
BUN: 5 mg/dL — ABNORMAL LOW (ref 6–23)
CO2: 23 mEq/L (ref 19–32)
Calcium: 9.1 mg/dL (ref 8.4–10.5)
Chloride: 99 mEq/L (ref 96–112)
Creatinine, Ser: 0.56 mg/dL (ref 0.50–1.10)
GFR calc non Af Amer: >90 mL/min (ref >90)
GLUCOSE: 99 mg/dL (ref 70–99)
Potassium: 3.7 mEq/L (ref 3.7–5.3)
SODIUM: 136 meq/L — AB (ref 137–147)

## 2014-07-29 LAB — URINE MICROSCOPIC-ADD ON

## 2014-07-29 LAB — CBC WITH DIFFERENTIAL/PLATELET
Basophils Absolute: 0 10*3/uL (ref 0.0–0.1)
Basophils Relative: 0 % (ref 0–1)
Eosinophils Absolute: 0.2 10*3/uL (ref 0.0–0.7)
Eosinophils Relative: 2 % (ref 0–5)
HCT: 37.5 % (ref 36.0–46.0)
Hemoglobin: 13.4 g/dL (ref 12.0–15.0)
LYMPHS ABS: 2.1 10*3/uL (ref 0.7–4.0)
Lymphocytes Relative: 23 % (ref 12–46)
MCH: 30.4 pg (ref 26.0–34.0)
MCHC: 35.7 g/dL (ref 30.0–36.0)
MCV: 85 fL (ref 78.0–100.0)
Monocytes Absolute: 0.6 10*3/uL (ref 0.1–1.0)
Monocytes Relative: 7 % (ref 3–12)
Neutro Abs: 6.5 10*3/uL (ref 1.7–7.7)
Neutrophils Relative %: 68 % (ref 43–77)
Platelets: 297 10*3/uL (ref 150–400)
RBC: 4.41 MIL/uL (ref 3.87–5.11)
RDW: 12.9 % (ref 11.5–15.5)
WBC: 9.5 10*3/uL (ref 4.0–10.5)

## 2014-07-29 LAB — PREGNANCY, URINE: PREG TEST UR: NEGATIVE

## 2014-07-29 MED ORDER — ONDANSETRON HCL 4 MG PO TABS: 4.0000 mg | ORAL_TABLET | Freq: Four times a day (QID) | ORAL | Status: DC | PRN

## 2014-07-29 MED ORDER — SODIUM CHLORIDE 0.9 % IV SOLN
1000.0000 mL | Freq: Once | INTRAVENOUS | Status: AC
  Administered 2014-07-29: 1000 mL via INTRAVENOUS

## 2014-07-29 MED ORDER — DIPHENHYDRAMINE HCL 50 MG/ML IJ SOLN
25.0000 mg | Freq: Once | INTRAMUSCULAR | Status: AC
  Administered 2014-07-29: 25 mg via INTRAVENOUS
  Filled 2014-07-29: qty 1

## 2014-07-29 MED ORDER — MORPHINE SULFATE 4 MG/ML IJ SOLN
4.0000 mg | Freq: Once | INTRAMUSCULAR | Status: AC
  Administered 2014-07-29: 4 mg via INTRAVENOUS
  Filled 2014-07-29: qty 1

## 2014-07-29 MED ORDER — SODIUM CHLORIDE 0.9 % IV SOLN: 1000.0000 mL | INTRAVENOUS | Status: DC

## 2014-07-29 MED ORDER — METOCLOPRAMIDE HCL 5 MG/ML IJ SOLN
10.0000 mg | Freq: Once | INTRAMUSCULAR | Status: AC
  Administered 2014-07-29: 10 mg via INTRAVENOUS
  Filled 2014-07-29: qty 2

## 2014-07-29 MED ORDER — ONDANSETRON HCL 4 MG/2ML IJ SOLN
4.0000 mg | Freq: Once | INTRAMUSCULAR | Status: AC
  Administered 2014-07-29: 4 mg via INTRAVENOUS
  Filled 2014-07-29: qty 2

## 2014-07-29 NOTE — ED Provider Notes (Signed)
CSN: [id number]     Arrival date & time 07/29/14  0054 History   First MD Initiated Contact with Patient 07/29/14 0142     Chief Complaint  Patient presents with  . Emesis     (Consider location/radiation/quality/duration/timing/severity/associated sxs/prior Treatment) Patient is a 33 y.o. female presenting with vomiting. The history is provided by the patient.  Emesis She started having nausea and vomiting at about 6 PM. She is vomited an estimated 20 times. There is associated abdominal cramping and she has not developed a global headache. Headache is throbbing and she rates pain at 8/10. There is no vision change. She denies diarrhea and denies arthralgias or myalgias. There has been a sick contact in that her rhythm it has a similar illness. She has not taken anything for her symptoms.  Past Medical History  Diagnosis Date  . Asthma   . Anxiety   . Panic attack   . Depression   . Migraine   . Abnormal Pap smear   . HSV-2 (herpes simplex virus 2) infection   . Vaginal Pap smear, abnormal   . Unspecified symptom associated with female genital organs 01/27/2014  . 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 Other and unspecified ovarian cyst 02/06/2014  . Hx of migraines 02/09/2014    Has ?aura, will rx POP  . Vulvar abscess 03/09/2014    [facility] rx'd bactrim ds x 10 days   Past Surgical History  Procedure Laterality Date  . Cesarean section    . Ovarian cyst removal    . Appendectomy    . Cholecystectomy    . Dilation and curettage of uterus     Family History  Problem Relation Age of Onset  . Ulcers Father   . Cancer Maternal Aunt     breast   . Diabetes Maternal Grandmother   . Diabetes Maternal Grandfather   . Alzheimer's disease Paternal Grandmother   . Diabetes Maternal Aunt    History  Substance Use Topics  . Smoking status: Current Every Day  Smoker -- 0.50 packs/day for 12 years    Types: Cigarettes  . Smokeless tobacco: Never Used  . Alcohol Use: Yes     Comment: socially   OB History   Grav Para Term Preterm Abortions TAB SAB Ect Mult Living   7 2 1 1 5  5   1      Review of Systems  Gastrointestinal: Positive for vomiting.  All other systems reviewed and are negative.     Allergies  Coconut flavor  Home Medications   Prior to Admission medications   Medication Sig Start Date End Date Taking? Authorizing Provider  HYDROcodone-acetaminophen (NORCO/VICODIN) 5-325 MG per tablet Take 1 tablet by mouth every 4 (four) hours as needed. 07/03/14   Vickki HearingStanley E Harrison, MD  ibuprofen (ADVIL,MOTRIN) 600 MG tablet Take 1 tablet (600 mg total) by mouth every 6 (six) hours as needed. 07/03/14   Vickki HearingStanley E Harrison, MD  norethindrone (HEATHER) 0.35 MG tablet Take 1 tablet by mouth daily.    Historical Provider, MD   BP 110/70  Pulse 77  Temp(Src) 97.8 F (36.6 C) (Oral)  Resp 18  Ht 5\' 6"  (1.676 m)  Wt 260 lb (117.935 kg)  BMI 41.99 kg/m2  SpO2 100%  LMP 07/25/2014 Physical Exam  Nursing note and vitals reviewed.  33 year old female,  resting comfortably and in no acute distress. Vital signs are normal. Oxygen saturation is 100%, which is normal. Head is normocephalic and atraumatic. PERRLA, EOMI. Oropharynx is clear. Neck is nontender and supple without adenopathy or JVD. Back is nontender and there is no CVA tenderness. Lungs are clear without rales, wheezes, or rhonchi. Chest is nontender. Heart has regular rate and rhythm without murmur. Abdomen is soft, flat, nontender without masses or hepatosplenomegaly and peristalsis is hypoactive. Extremities have no cyanosis or edema, full range of motion is present. Skin is warm and dry without rash. Neurologic: Mental status is normal, cranial nerves are intact, there are no motor or sensory deficits.  ED Course  Procedures (including critical care time) Labs  Review Results for orders placed during the hospital encounter of 07/29/14  CBC WITH DIFFERENTIAL      Result Value Ref Range   WBC 9.5  4.0 - 10.5 K/uL   RBC 4.41  3.87 - 5.11 MIL/uL   Hemoglobin 13.4  12.0 - 15.0 g/dL   HCT 40.9  81.1 - 91.4 %   MCV 85.0  78.0 - 100.0 fL   MCH 30.4  26.0 - 34.0 pg   MCHC 35.7  30.0 - 36.0 g/dL   RDW 78.2  95.6 - 21.3 %   Platelets 297  150 - 400 K/uL   Neutrophils Relative % 68  43 - 77 %   Neutro Abs 6.5  1.7 - 7.7 K/uL   Lymphocytes Relative 23  12 - 46 %   Lymphs Abs 2.1  0.7 - 4.0 K/uL   Monocytes Relative 7  3 - 12 %   Monocytes Absolute 0.6  0.1 - 1.0 K/uL   Eosinophils Relative 2  0 - 5 %   Eosinophils Absolute 0.2  0.0 - 0.7 K/uL   Basophils Relative 0  0 - 1 %   Basophils Absolute 0.0  0.0 - 0.1 K/uL  BASIC METABOLIC PANEL      Result Value Ref Range   Sodium 136 (*) 137 - 147 mEq/L   Potassium 3.7  3.7 - 5.3 mEq/L   Chloride 99  96 - 112 mEq/L   CO2 23  19 - 32 mEq/L   Glucose, Bld 99  70 - 99 mg/dL   BUN 5 (*) 6 - 23 mg/dL   Creatinine, Ser 0.86  0.50 - 1.10 mg/dL   Calcium 9.1  8.4 - 57.8 mg/dL   GFR calc non Af Amer >90  >90 mL/min   GFR calc Af Amer >90  >90 mL/min   Anion gap 14  5 - 15  URINALYSIS, ROUTINE W REFLEX MICROSCOPIC      Result Value Ref Range   Color, Urine YELLOW  YELLOW   APPearance CLEAR  CLEAR   Specific Gravity, Urine <1.005 (*) 1.005 - 1.030   pH 6.0  5.0 - 8.0   Glucose, UA NEGATIVE  NEGATIVE mg/dL   Hgb urine dipstick LARGE (*) NEGATIVE   Bilirubin Urine NEGATIVE  NEGATIVE   Ketones, ur NEGATIVE  NEGATIVE mg/dL   Protein, ur NEGATIVE  NEGATIVE mg/dL   Urobilinogen, UA 0.2  0.0 - 1.0 mg/dL   Nitrite NEGATIVE  NEGATIVE   Leukocytes, UA NEGATIVE  NEGATIVE  PREGNANCY, URINE      Result Value Ref Range   Preg Test, Ur NEGATIVE  NEGATIVE  URINE MICROSCOPIC-ADD ON      Result Value Ref Range   Squamous Epithelial / LPF FEW (*) RARE   WBC,  UA 0-2  <3 WBC/hpf   RBC / HPF 0-2  <3 RBC/hpf    Bacteria, UA RARE  RARE   MDM   Final diagnoses:  Non-intractable vomiting with nausea, vomiting of unspecified type  Headache, unspecified headache type    Nausea and vomiting go which most likely represents a viral gastritis/gastroenteritis. She will be given IV fluids and and IV metoclopramide which will treat the nausea and her headache.  Nausea and vomiting were improved with metoclopramide but headache persisted. She was given a dose of morphine with excellent relief of headache. She is discharged with a prescription for ondansetron.  Dione Booze, MD 07/29/14 [phone]

## 2014-07-29 NOTE — Discharge Instructions (Signed)
Nausea and Vomiting °Nausea is a sick feeling that often comes before throwing up (vomiting). Vomiting is a reflex where stomach contents come out of your mouth. Vomiting can cause severe loss of body fluids (dehydration). Children and elderly adults can become dehydrated quickly, especially if they also have diarrhea. Nausea and vomiting are symptoms of a condition or disease. It is important to find the cause of your symptoms. °CAUSES  °· Direct irritation of the stomach lining. This irritation can result from increased acid production (gastroesophageal reflux disease), infection, food poisoning, taking certain medicines (such as nonsteroidal anti-inflammatory drugs), alcohol use, or tobacco use. °· Signals from the brain. These signals could be caused by a headache, heat exposure, an inner ear disturbance, increased pressure in the brain from injury, infection, a tumor, or a concussion, pain, emotional stimulus, or metabolic problems. °· An obstruction in the gastrointestinal tract (bowel obstruction). °· Illnesses such as diabetes, hepatitis, gallbladder problems, appendicitis, kidney problems, cancer, sepsis, atypical symptoms of a heart attack, or eating disorders. °· Medical treatments such as chemotherapy and radiation. °· Receiving medicine that makes you sleep (general anesthetic) during surgery. °DIAGNOSIS °Your caregiver may ask for tests to be done if the problems do not improve after a few days. Tests may also be done if symptoms are severe or if the reason for the nausea and vomiting is not clear. Tests may include: °· Urine tests. °· Blood tests. °· Stool tests. °· Cultures (to look for evidence of infection). °· X-rays or other imaging studies. °Test results can help your caregiver make decisions about treatment or the need for additional tests. °TREATMENT °You need to stay well hydrated. Drink frequently but in small amounts. You may wish to drink water, sports drinks, clear broth, or eat frozen  ice pops or gelatin dessert to help stay hydrated. When you eat, eating slowly may help prevent nausea. There are also some antinausea medicines that may help prevent nausea. °HOME CARE INSTRUCTIONS  °· Take all medicine as directed by your caregiver. °· If you do not have an appetite, do not force yourself to eat. However, you must continue to drink fluids. °· If you have an appetite, eat a normal diet unless your caregiver tells you differently. °¨ Eat a variety of complex carbohydrates (rice, wheat, potatoes, bread), lean meats, yogurt, fruits, and vegetables. °¨ Avoid high-fat foods because they are more difficult to digest. °· Drink enough water and fluids to keep your urine clear or pale yellow. °· If you are dehydrated, ask your caregiver for specific rehydration instructions. Signs of dehydration may include: °¨ Severe thirst. °¨ Dry lips and mouth. °¨ Dizziness. °¨ Dark urine. °¨ Decreasing urine frequency and amount. °¨ Confusion. °¨ Rapid breathing or pulse. °SEEK IMMEDIATE MEDICAL CARE IF:  °· You have blood or brown flecks (like coffee grounds) in your vomit. °· You have black or bloody stools. °· You have a severe headache or stiff neck. °· You are confused. °· You have severe abdominal pain. °· You have chest pain or trouble breathing. °· You do not urinate at least once every 8 hours. °· You develop cold or clammy skin. °· You continue to vomit for longer than 24 to 48 hours. °· You have a fever. °MAKE SURE YOU:  °· Understand these instructions. °· Will watch your condition. °· Will get help right away if you are not doing well or get worse. °Document Released: 12/15/2005 Document Revised: 03/08/2012 Document Reviewed: 05/14/2011 °[employer]® Patient Information ©2015 [employer]. This information is not intended   to replace advice given to you by your health care provider. Make sure you discuss any questions you have with your health care provider.  General Headache Without Cause A headache is  pain or discomfort felt around the head or neck area. The specific cause of a headache may not be found. There are many causes and types of headaches. A few common ones are:  Tension headaches.  Migraine headaches.  Cluster headaches.  Chronic daily headaches. HOME CARE INSTRUCTIONS   Keep all follow-up appointments with your caregiver or any specialist referral.  Only take over-the-counter or prescription medicines for pain or discomfort as directed by your caregiver.  Lie down in a dark, quiet room when you have a headache.  Keep a headache journal to find out what may trigger your migraine headaches. For example, write down:  What you eat and drink.  How much sleep you get.  Any change to your diet or medicines.  Try massage or other relaxation techniques.  Put ice packs or heat on the head and neck. Use these 3 to 4 times per day for 15 to 20 minutes each time, or as needed.  Limit stress.  Sit up straight, and do not tense your muscles.  Quit smoking if you smoke.  Limit alcohol use.  Decrease the amount of caffeine you drink, or stop drinking caffeine.  Eat and sleep on a regular schedule.  Get 7 to 9 hours of sleep, or as recommended by your caregiver.  Keep lights dim if bright lights bother you and make your headaches worse. SEEK MEDICAL CARE IF:   You have problems with the medicines you were prescribed.  Your medicines are not working.  You have a change from the usual headache.  You have nausea or vomiting. SEEK IMMEDIATE MEDICAL CARE IF:   Your headache becomes severe.  You have a fever.  You have a stiff neck.  You have loss of vision.  You have muscular weakness or loss of muscle control.  You start losing your balance or have trouble walking.  You feel faint or pass out.  You have severe symptoms that are different from your first symptoms. MAKE SURE YOU:   Understand these instructions.  Will watch your condition.  Will get  help right away if you are not doing well or get worse. Document Released: 12/15/2005 Document Revised: 03/08/2012 Document Reviewed: 12/31/2011 [facility] Patient Information 2015 [place], [place]. This information is not intended to replace advice given to you by your health care provider. Make sure you discuss any questions you have with your health care provider.  Ondansetron tablets What is this medicine? ONDANSETRON (on DAN se tron) is used to treat nausea and vomiting caused by chemotherapy. It is also used to prevent or treat nausea and vomiting after surgery. This medicine may be used for other purposes; ask your health care provider or pharmacist if you have questions. COMMON BRAND NAME(S): Zofran What should I tell my health care provider before I take this medicine? They need to know if you have any of these conditions: -heart disease -history of irregular heartbeat -liver disease -low levels of magnesium or potassium in the blood -an unusual or allergic reaction to ondansetron, granisetron, other medicines, foods, dyes, or preservatives -pregnant or trying to get pregnant -breast-feeding How should I use this medicine? Take this medicine by mouth with a glass of water. Follow the directions on your prescription label. Take your doses at regular intervals. Do not take your medicine more  often than directed. Talk to your pediatrician regarding the use of this medicine in children. Special care may be needed. Overdosage: If you think you have taken too much of this medicine contact a poison control center or emergency room at once. NOTE: This medicine is only for you. Do not share this medicine with others. What if I miss a dose? If you miss a dose, take it as soon as you can. If it is almost time for your next dose, take only that dose. Do not take double or extra doses. What may interact with this medicine? Do not take this medicine with any of the following  medications: -apomorphine -certain medicines for fungal infections like fluconazole, itraconazole, ketoconazole, posaconazole, voriconazole -cisapride -dofetilide -dronedarone -pimozide -thioridazine -ziprasidone This medicine may also interact with the following medications: -carbamazepine -certain medicines for depression, anxiety, or psychotic disturbances -fentanyl -linezolid -MAOIs like Carbex, Eldepryl, Marplan, Nardil, and Parnate -methylene blue (injected into a vein) -other medicines that prolong the QT interval (cause an abnormal heart rhythm) -phenytoin -rifampicin -tramadol This list may not describe all possible interactions. Give your health care provider a list of all the medicines, herbs, non-prescription drugs, or dietary supplements you use. Also tell them if you smoke, drink alcohol, or use illegal drugs. Some items may interact with your medicine. What should I watch for while using this medicine? Check with your doctor or health care professional right away if you have any sign of an allergic reaction. What side effects may I notice from receiving this medicine? Side effects that you should report to your doctor or health care professional as soon as possible: -allergic reactions like skin rash, itching or hives, swelling of the face, lips or tongue -breathing problems -confusion -dizziness -fast or irregular heartbeat -feeling faint or lightheaded, falls -fever and chills -loss of balance or coordination -seizures -sweating -swelling of the hands or feet -tightness in the chest -tremors -unusually weak or tired Side effects that usually do not require medical attention (report to your doctor or health care professional if they continue or are bothersome): -constipation or diarrhea -headache This list may not describe all possible side effects. Call your doctor for medical advice about side effects. You may report side effects to FDA at  1-800-FDA-1088. Where should I keep my medicine? Keep out of the reach of children. Store between 2 and 30 degrees C (36 and 86 degrees F). Throw away any unused medicine after the expiration date. NOTE: This sheet is a summary. It may not cover all possible information. If you have questions about this medicine, talk to your doctor, pharmacist, or health care provider.  2015, Elsevier/Gold Standard. (2013-09-21 16:27:45)

## 2014-07-29 NOTE — ED Notes (Signed)
Vomiting started approx 6 pm last night, has vomited multiple times since then, also with headache now.

## 2014-07-31 ENCOUNTER — Ambulatory Visit (INDEPENDENT_AMBULATORY_CARE_PROVIDER_SITE_OTHER): Payer: Medicaid Other | Admitting: Orthopedic Surgery

## 2014-07-31 VITALS — Ht 66.0 in | Wt 260.0 lb

## 2014-07-31 DIAGNOSIS — IMO0002 Reserved for concepts with insufficient information to code with codable children: Secondary | ICD-10-CM

## 2014-07-31 DIAGNOSIS — S83206A Unspecified tear of unspecified meniscus, current injury, right knee, initial encounter: Secondary | ICD-10-CM

## 2014-07-31 MED ORDER — HYDROCODONE-ACETAMINOPHEN 5-325 MG PO TABS: 1.0000 | ORAL_TABLET | ORAL | Status: DC | PRN

## 2014-07-31 NOTE — Progress Notes (Signed)
Patient ID: Barbara Jennings, female   DOB: 06/13/1981, 33 y.o.   MRN: [id number] Chief Complaint  Patient presents with  . Results    review MRI right knee, DOI 06/16/14    Patient injured her knee back and comes in for repeat evaluation an MRI followup. MRI shows no intra-articular structural damage other than some chondral changes. Review of systems negative   Vital signs stable appearance normal oriented x3 mood normal Ambulation: shows a limp  She does still have some tenderness no swelling He does have stiffness and decreased range of motion secondary bracing knee feels stable  Knee exercises follow up in 6 weeks remove brace.  She's been in knee immobilizer for support  Follow 6 weeks  Meds ordered this encounter  Medications  . HYDROcodone-acetaminophen (NORCO/VICODIN) 5-325 MG per tablet    Sig: Take 1 tablet by mouth every 4 (four) hours as needed.    Dispense:  56 tablet    Refill:  0

## 2014-07-31 NOTE — Patient Instructions (Signed)
Knee Exercises EXERCISES RANGE OF MOTION(ROM) AND STRETCHING EXERCISES These exercises may help you when beginning to rehabilitate your injury. Your symptoms may resolve with or without further involvement from your physician, physical therapist or athletic trainer. While completing these exercises, remember:    Restoring tissue flexibility helps normal motion to return to the joints. This allows healthier, less painful movement and activity.   An effective stretch should be held for at least 30 seconds.   A stretch should never be painful. You should only feel a gentle lengthening or release in the stretched tissue.  STRETCH - Knee Extension, Prone  Lie on your stomach on a firm surface, such as a bed or countertop. Place your right / left knee and leg just beyond the edge of the surface. You may wish to place a towel under the far end of your right / left thigh for comfort.   Relax your leg muscles and allow gravity to straighten your knee. Your clinician may advise you to add an ankle weight if more resistance is helpful for you.  You should feel a stretch in the back of your right / left knee.  RANGE OF MOTION - Knee Flexion, Active  Lie on your back with both knees straight. (If this causes back discomfort, bend your opposite knee, placing your foot flat on the floor.)   Slowly slide your heel back toward your buttocks until you feel a gentle stretch in the front of your knee or thigh.     STRETCH - Quadriceps, Prone   Lie on your stomach on a firm surface, such as a bed or padded floor.   Bend your right / left knee and grasp your ankle. If you are unable to reach, your ankle or pant leg, use a belt around your foot to lengthen your reach.   Gently pull your heel toward your buttocks. Your knee should not slide out to the side. You should feel a stretch in the front of your thigh and/or knee.   STRETCH  Hamstrings, Supine   Lie on your back. Loop a belt or towel over the ball of  your right / left foot.   Straighten your right / left knee and slowly pull on the belt to raise your leg. Do not allow the right / left knee to bend. Keep your opposite leg flat on the floor.  Raise the leg until you feel a gentle stretch behind your right / left knee or thigh.  STRENGTHENING EXERCISES These exercises may help you when beginning to rehabilitate your injury. They may resolve your symptoms with or without further involvement from your physician, physical therapist or athletic trainer. While completing these exercises, remember:    Muscles can gain both the endurance and the strength needed for everyday activities through controlled exercises.   Complete these exercises as instructed by your physician, physical therapist or athletic trainer. Progress the resistance and repetitions only as guided.   You may experience muscle soreness or fatigue, but the pain or discomfort you are trying to eliminate should never worsen during these exercises. If this pain does worsen, stop and make certain you are following the directions exactly. If the pain is still present after adjustments, discontinue the exercise until you can discuss the trouble with your clinician.  STRENGTH - Quadriceps, Isometrics  Lie on your back with your right / left leg extended and your opposite knee bent.   Gradually tense the muscles in the front of your right / left   thigh. You should see either your knee cap slide up toward your hip or increased dimpling just above the knee. This motion will push the back of the knee down toward the floor/mat/bed on which you are lying.   STRENGTH - Quadriceps, Short Arcs   Lie on your back. Place a rolled  towel roll under your knee so that the knee slightly bends.   Raise only your lower leg by tightening the muscles in the front of your thigh. Do not allow your thigh to rise.     STRENGTH - Quadriceps, Straight Leg Raises  Quality counts! Watch for signs that the  quadriceps muscle is working to insure you are strengthening the correct muscles and not "cheating" by substituting with healthier muscles.  Lay on your back with your right / left leg extended and your opposite knee bent.   Tense the muscles in the front of your right / left thigh. You should see either your knee cap slide up or increased dimpling just above the knee. Your thigh may even quiver.  Tighten these muscles even more and raise your leg 4 to 6 inches off the floor.   STRENGTH - Hamstring, Curls  Lay on your stomach with your legs extended. (If you lay on a bed, your feet may hang over the edge.)   Tighten the muscles in the back of your thigh to bend your right / left knee up to 90 degrees. Keep your hips flat on the bed/floor.    STRENGTH  Quadriceps, Squats  Stand in a door frame so that your feet and knees are in line with the frame.   Use your hands for balance, not support, on the frame.   Slowly lower your weight, bending at the hips and knees. Keep your lower legs upright so that they are parallel with the door frame. Squat only within the range that does not increase your knee pain. Never let your hips drop below your knees.   Slowly return upright, pushing with your legs, not pulling with your hands.   STRENGTH - Quadriceps, Wall Slides  Follow guidelines for form closely. Increased knee pain often results from poorly placed feet or knees.  Lean against a smooth wall or door and walk your feet out 18-24 inches. Place your feet hip-width apart.   Slowly slide down the wall or door until your knees bend _30________ degrees.* Keep your knees over your heels, not your toes, and in line with your hips, not falling to either side.   * Your physician, physical therapist or athletic trainer will alter this angle based on your symptoms and progress. Document Released: 10/29/2005 Document Revised: 03/08/2012 Document Reviewed: 03/29/2009 [employer] Patient Information 2013  [employer].    

## 2014-08-07 MED FILL — Ondansetron HCl Tab 4 MG: ORAL | Qty: 4 | Status: AC

## 2014-09-12 ENCOUNTER — Ambulatory Visit (INDEPENDENT_AMBULATORY_CARE_PROVIDER_SITE_OTHER): Payer: Medicaid Other | Admitting: Orthopedic Surgery

## 2014-09-12 VITALS — BP 97/77 | Ht 66.0 in | Wt 260.0 lb

## 2014-09-12 DIAGNOSIS — M25569 Pain in unspecified knee: Secondary | ICD-10-CM

## 2014-09-12 DIAGNOSIS — M25561 Pain in right knee: Principal | ICD-10-CM

## 2014-09-12 DIAGNOSIS — G8929 Other chronic pain: Secondary | ICD-10-CM

## 2014-09-12 MED ORDER — IBUPROFEN 600 MG PO TABS: 600.0000 mg | ORAL_TABLET | Freq: Four times a day (QID) | ORAL | Status: DC | PRN

## 2014-09-12 MED ORDER — HYDROCODONE-ACETAMINOPHEN 5-325 MG PO TABS: 1.0000 | ORAL_TABLET | ORAL | Status: DC | PRN

## 2014-09-12 NOTE — Patient Instructions (Signed)
Continue home exercises.

## 2014-09-12 NOTE — Progress Notes (Signed)
Followup established patient recheck right knee  Chief Complaint  Patient presents with  . Follow-up    6 week recheck Right knee/ACL following home exercises    The patient's MRI shows that she does not have anterior cruciate ligament tear she had a bone contusion without intra-articular pathology  She complains of giving way symptoms she says she's been compliant with her home exercise program has Medicaid only allows limited visits  Her review of systems is negative and the musculoskeletal and neurologic areas  Her knee exam shows quadriceps weakness no tenderness no swelling full passive range of motion extensor lag on the extensor testing but normal straight leg raise stability tests are normal muscle tone is normal skin is normal pulses are normal  The patient is advised to continue her home exercise program she can take the following medication until the prescription runs out no followup necessary  Meds ordered this encounter  Medications  . HYDROcodone-acetaminophen (NORCO/VICODIN) 5-325 MG per tablet    Sig: Take 1 tablet by mouth every 4 (four) hours as needed.    Dispense:  56 tablet    Refill:  0  . ibuprofen (ADVIL,MOTRIN) 600 MG tablet    Sig: Take 1 tablet (600 mg total) by mouth every 6 (six) hours as needed.    Dispense:  60 tablet    Refill:  2

## 2014-10-11 ENCOUNTER — Ambulatory Visit (INDEPENDENT_AMBULATORY_CARE_PROVIDER_SITE_OTHER): Payer: Medicaid Other | Admitting: Adult Health

## 2014-10-11 ENCOUNTER — Encounter: Payer: Self-pay | Admitting: Adult Health

## 2014-10-11 ENCOUNTER — Telehealth: Payer: Self-pay | Admitting: Adult Health

## 2014-10-11 VITALS — BP 128/72 | Ht 66.0 in | Wt 290.0 lb

## 2014-10-11 DIAGNOSIS — N946 Dysmenorrhea, unspecified: Secondary | ICD-10-CM

## 2014-10-11 DIAGNOSIS — Z3202 Encounter for pregnancy test, result negative: Secondary | ICD-10-CM

## 2014-10-11 DIAGNOSIS — N939 Abnormal uterine and vaginal bleeding, unspecified: Secondary | ICD-10-CM

## 2014-10-11 HISTORY — DX: Abnormal uterine and vaginal bleeding, unspecified: N93.9

## 2014-10-11 HISTORY — DX: Dysmenorrhea, unspecified: N94.6

## 2014-10-11 LAB — CBC
HCT: 39.7 % (ref 36.0–46.0)
HEMOGLOBIN: 13.5 g/dL (ref 12.0–15.0)
MCH: 28.7 pg (ref 26.0–34.0)
MCHC: 34 g/dL (ref 30.0–36.0)
MCV: 84.5 fL (ref 78.0–100.0)
Platelets: 317 10*3/uL (ref 150–400)
RBC: 4.7 MIL/uL (ref 3.87–5.11)
RDW: 14.3 % (ref 11.5–15.5)
WBC: 9.2 10*3/uL (ref 4.0–10.5)

## 2014-10-11 LAB — POCT URINE PREGNANCY: Preg Test, Ur: NEGATIVE

## 2014-10-11 MED ORDER — HYDROCODONE-ACETAMINOPHEN 5-325 MG PO TABS: 1.0000 | ORAL_TABLET | Freq: Four times a day (QID) | ORAL | Status: DC | PRN

## 2014-10-11 NOTE — Progress Notes (Signed)
Subjective:     Patient ID: Barbara Jennings, female   DOB: January 07, 1981, 33 y.o.   MRN: [id number]  HPI Christyna is a 33 year old white female in complaining of bleeding for 4 weeks on micronor and painful periods.No sex in 7-8 months,going to school.  Review of Systems See HPI Reviewed past medical,surgical, social and family history. Reviewed medications and allergies.     Objective:   Physical Exam BP 128/72  Ht 5' 6"  (1.676 m)  Wt 290 lb (131.543 kg)  BMI 46.83 kg/m2  LMP 09/16/2015UPT negative, Skin warm and dry.Pelvic: external genitalia is normal in appearance, vagina: period like blood, cervix:smooth and bulbous, uterus: normal size, shape and contour,  Mildly tender, no masses felt, adnexa: no masses or tenderness noted.  GC/CHL obtained.     Assessment:    AUB Dysmenorrhea     Plan:     GC/CHL sent Rx norco 5-325 mg #30 1 every 6 hours prn pain no refills Check CBC,MP,TSH and ESR Return in 1 week for [place] and see me Review handout on AUB and dysmenorrhea

## 2014-10-11 NOTE — Telephone Encounter (Signed)
Called complains of bleeding about 4 weeks to come in at 1:45 today

## 2014-10-11 NOTE — Patient Instructions (Signed)
Dysmenorrhea Menstrual cramps (dysmenorrhea) are caused by the muscles of the uterus tightening (contracting) during a menstrual period. For some women, this discomfort is merely bothersome. For others, dysmenorrhea can be severe enough to interfere with everyday activities for a few days each month. Primary dysmenorrhea is menstrual cramps that last a couple of days when you start having menstrual periods or soon after. This often begins after a teenager starts having her period. As a woman gets older or has a baby, the cramps will usually lessen or disappear. Secondary dysmenorrhea begins later in life, lasts longer, and the pain may be stronger than primary dysmenorrhea. The pain may start before the period and last a few days after the period.  CAUSES  Dysmenorrhea is usually caused by an underlying problem, such as:  The tissue lining the uterus grows outside of the uterus in other areas of the body (endometriosis).  The endometrial tissue, which normally lines the uterus, is found in or grows into the muscular walls of the uterus (adenomyosis).  The pelvic blood vessels are engorged with blood just before the menstrual period (pelvic congestive syndrome).  Overgrowth of cells (polyps) in the lining of the uterus or cervix.  Falling down of the uterus (prolapse) because of loose or stretched ligaments.  Depression.  Bladder problems, infection, or inflammation.  Problems with the intestine, a tumor, or irritable bowel syndrome.  Cancer of the female organs or bladder.  A severely tipped uterus.  A very tight opening or closed cervix.  Noncancerous tumors of the uterus (fibroids).  Pelvic inflammatory disease (PID).  Pelvic scarring (adhesions) from a previous surgery.  Ovarian cyst.  An intrauterine device (IUD) used for birth control. RISK FACTORS You may be at greater risk of dysmenorrhea if:  You are younger than age 62.  You started puberty early.  You have  irregular or heavy bleeding.  You have never given birth.  You have a family history of this problem.  You are a smoker. SIGNS AND SYMPTOMS   Cramping or throbbing pain in your lower abdomen.  Headaches.  Lower back pain.  Nausea or vomiting.  Diarrhea.  Sweating or dizziness.  Loose stools. DIAGNOSIS  A diagnosis is based on your history, symptoms, physical exam, diagnostic tests, or procedures. Diagnostic tests or procedures may include:  Blood tests.  Ultrasonography.  An examination of the lining of the uterus (dilation and curettage, D&C).  An examination inside your abdomen or pelvis with a scope (laparoscopy).  X-rays.  CT scan.  MRI.  An examination inside the bladder with a scope (cystoscopy).  An examination inside the intestine or stomach with a scope (colonoscopy, gastroscopy). TREATMENT  Treatment depends on the cause of the dysmenorrhea. Treatment may include:  Pain medicine prescribed by your health care provider.  Birth control pills or an IUD with progesterone hormone in it.  Hormone replacement therapy.  Nonsteroidal anti-inflammatory drugs (NSAIDs). These may help stop the production of prostaglandins.  Surgery to remove adhesions, endometriosis, ovarian cyst, or fibroids.  Removal of the uterus (hysterectomy).  Progesterone shots to stop the menstrual period.  Cutting the nerves on the sacrum that go to the female organs (presacral neurectomy).  Electric current to the sacral nerves (sacral nerve stimulation).  Antidepressant medicine.  Psychiatric therapy, counseling, or group therapy.  Exercise and physical therapy.  Meditation and yoga therapy.  Acupuncture. HOME CARE INSTRUCTIONS   Only take over-the-counter or prescription medicines as directed by your health care provider.  Place a heating pad  or hot water bottle on your lower back or abdomen. Do not sleep with the heating pad.  Use aerobic exercises, walking,  swimming, biking, and other exercises to help lessen the cramping.  Massage to the lower back or abdomen may help.  Stop smoking.  Avoid alcohol and caffeine. SEEK MEDICAL CARE IF:   Your pain does not get better with medicine.  You have pain with sexual intercourse.  Your pain increases and is not controlled with medicines.  You have abnormal vaginal bleeding with your period.  You develop nausea or vomiting with your period that is not controlled with medicine. SEEK IMMEDIATE MEDICAL CARE IF:  You pass out.  Document Released: 12/15/2005 Document Revised: 08/17/2013 Document Reviewed: 06/02/2013  [facility] Patient Information 2015 [place], [place]. This information is not intended to replace advice given to you by your health care provider. Make sure you discuss any questions you have with your health care provider. Dysfunctional Uterine Bleeding Normally, menstrual periods begin between ages 6611 to 7317 in young women. A normal menstrual cycle/period may begin every 23 days up to 35 days and lasts from 1 to 7 days. Around 12 to 14 days before your menstrual period starts, ovulation (ovary produces an egg) occurs. When counting the time between menstrual periods, count from the first day of bleeding of the previous period to the first day of bleeding of the next period. Dysfunctional (abnormal) uterine bleeding is bleeding that is different from a normal menstrual period. Your periods may come earlier or later than usual. They may be lighter, have blood clots or be heavier. You may have bleeding between periods, or you may skip one period or more. You may have bleeding after sexual intercourse, bleeding after menopause, or no menstrual period. CAUSES   Pregnancy (normal, miscarriage, tubal).  IUDs (intrauterine device, birth control).  Birth control pills.  Hormone treatment.  Menopause.  Infection of the cervix.  Blood clotting problems.  Infection of the inside lining of the  uterus.  Endometriosis, inside lining of the uterus growing in the pelvis and other female organs.  Adhesions (scar tissue) inside the uterus.  Obesity or severe weight loss.  Uterine polyps inside the uterus.  Cancer of the vagina, cervix, or uterus.  Ovarian cysts or polycystic ovary syndrome.  Medical problems (diabetes, thyroid disease).  Uterine fibroids (noncancerous tumor).  Problems with your female hormones.  Endometrial hyperplasia, very thick lining and enlarged cells inside of the uterus.  Medicines that interfere with ovulation.  Radiation to the pelvis or abdomen.  Chemotherapy. DIAGNOSIS   Your doctor will discuss the history of your menstrual periods, medicines you are taking, changes in your weight, stress in your life, and any medical problems you may have.  Your doctor will do a physical and pelvic examination.  Your doctor may want to perform certain tests to make a diagnosis, such as:  Pap test.  Blood tests.  Cultures for infection.  CT scan.  Ultrasound.  Hysteroscopy.  Laparoscopy.  MRI.  Hysterosalpingography.  D and C.  Endometrial biopsy. TREATMENT  Treatment will depend on the cause of the dysfunctional uterine bleeding (DUB). Treatment may include:  Observing your menstrual periods for a couple of months.  Prescribing medicines for medical problems, including:  Antibiotics.  Hormones.  Birth control pills.  Removing an IUD (intrauterine device, birth control).  Surgery:  D and C (scrape and remove tissue from inside the uterus).  Laparoscopy (examine inside the abdomen with a lighted tube).  Uterine ablation (destroy  lining of the uterus with electrical current, laser, heat, or freezing).  Hysteroscopy (examine cervix and uterus with a lighted tube).  Hysterectomy (remove the uterus). HOME CARE INSTRUCTIONS   If medicines were prescribed, take exactly as directed. Do not change or switch medicines without  consulting your caregiver.  Long term heavy bleeding may result in iron deficiency. Your caregiver may have prescribed iron pills. They help replace the iron that your body lost from heavy bleeding. Take exactly as directed.  Do not take aspirin or medicines that contain aspirin one week before or during your menstrual period. Aspirin may make the bleeding worse.  If you need to change your sanitary pad or tampon more than once every 2 hours, stay in bed with your feet elevated and a cold pack on your lower abdomen. Rest as much as possible, until the bleeding stops or slows down.  Eat well-balanced meals. Eat foods high in iron. Examples are:  Leafy green vegetables.  Whole-grain breads and cereals.  Eggs.  Meat.  Liver.  Do not try to lose weight until the abnormal bleeding has stopped and your blood iron level is back to normal. Do not lift more than ten pounds or do strenuous activities when you are bleeding.  For a couple of months, make note on your calendar, marking the start and ending of your period, and the type of bleeding (light, medium, heavy, spotting, clots or missed periods). This is for your caregiver to better evaluate your problem. SEEK MEDICAL CARE IF:   You develop nausea (feeling sick to your stomach) and vomiting, dizziness, or diarrhea while you are taking your medicine.  You are getting lightheaded or weak.  You have any problems that may be related to the medicine you are taking.  You develop pain with your DUB.  You want to remove your IUD.  You want to stop or change your birth control pills or hormones.  You have any type of abnormal bleeding mentioned above.  You are over 33 years old and have not had a menstrual period yet.  You are 33 years old and you are still having menstrual periods.  You have any of the symptoms mentioned above.  You develop a rash. SEEK IMMEDIATE MEDICAL CARE IF:   An oral temperature above 102 F (38.9 C)  develops.  You develop chills.  You are changing your sanitary pad or tampon more than once an hour.  You develop abdominal pain.  You pass out or faint. Document Released: 12/12/2000 Document Revised: 03/08/2012 Document Reviewed: 11/13/2009 [facility] Patient Information 2015 [place]ExitCare, [place]. This information is not intended to replace advice given to you by your health care provider. Make sure you discuss any questions you have with your health care provider. Return in 1 week for [place]

## 2014-10-12 ENCOUNTER — Encounter (HOSPITAL_COMMUNITY): Payer: Self-pay | Admitting: Emergency Medicine

## 2014-10-12 ENCOUNTER — Telehealth: Payer: Self-pay | Admitting: Adult Health

## 2014-10-12 ENCOUNTER — Emergency Department (HOSPITAL_COMMUNITY)
Admission: EM | Admit: 2014-10-12 | Discharge: 2014-10-13 | Disposition: A | Discharge status: Home / Self Care | Payer: Medicaid Other | Attending: Emergency Medicine | Admitting: Emergency Medicine

## 2014-10-12 DIAGNOSIS — J45909 Unspecified asthma, uncomplicated: Secondary | ICD-10-CM | POA: Insufficient documentation

## 2014-10-12 DIAGNOSIS — G43809 Other migraine, not intractable, without status migrainosus: Secondary | ICD-10-CM | POA: Insufficient documentation

## 2014-10-12 DIAGNOSIS — Z8659 Personal history of other mental and behavioral disorders: Secondary | ICD-10-CM | POA: Insufficient documentation

## 2014-10-12 DIAGNOSIS — Z8742 Personal history of other diseases of the female genital tract: Secondary | ICD-10-CM | POA: Diagnosis not present

## 2014-10-12 DIAGNOSIS — Y9289 Other specified places as the place of occurrence of the external cause: Secondary | ICD-10-CM | POA: Insufficient documentation

## 2014-10-12 DIAGNOSIS — Z8619 Personal history of other infectious and parasitic diseases: Secondary | ICD-10-CM | POA: Insufficient documentation

## 2014-10-12 DIAGNOSIS — W228XXA Striking against or struck by other objects, initial encounter: Secondary | ICD-10-CM | POA: Diagnosis not present

## 2014-10-12 DIAGNOSIS — Y9389 Activity, other specified: Secondary | ICD-10-CM | POA: Insufficient documentation

## 2014-10-12 DIAGNOSIS — S0990XA Unspecified injury of head, initial encounter: Secondary | ICD-10-CM | POA: Diagnosis present

## 2014-10-12 DIAGNOSIS — Z720 Tobacco use: Secondary | ICD-10-CM | POA: Diagnosis not present

## 2014-10-12 LAB — COMPREHENSIVE METABOLIC PANEL
ALT: 24 U/L (ref 0–35)
AST: 19 U/L (ref 0–37)
Albumin: 4.3 g/dL (ref 3.5–5.2)
Alkaline Phosphatase: 65 U/L (ref 39–117)
BUN: 6 mg/dL (ref 6–23)
CALCIUM: 9.4 mg/dL (ref 8.4–10.5)
CHLORIDE: 104 meq/L (ref 96–112)
CO2: 25 meq/L (ref 19–32)
CREATININE: 0.57 mg/dL (ref 0.50–1.10)
GLUCOSE: 82 mg/dL (ref 70–99)
Potassium: 4.2 mEq/L (ref 3.5–5.3)
Sodium: 137 mEq/L (ref 135–145)
TOTAL PROTEIN: 7.1 g/dL (ref 6.0–8.3)
Total Bilirubin: 0.4 mg/dL (ref 0.2–1.2)

## 2014-10-12 LAB — SEDIMENTATION RATE: SED RATE: 10 mm/h (ref 0–22)

## 2014-10-12 LAB — GC/CHLAMYDIA PROBE AMP
CT Probe RNA: NEGATIVE
GC Probe RNA: NEGATIVE

## 2014-10-12 LAB — TSH: TSH: 1.423 u[IU]/mL (ref 0.350–4.500)

## 2014-10-12 MED ORDER — HYDROMORPHONE HCL 1 MG/ML IJ SOLN
1.0000 mg | Freq: Once | INTRAMUSCULAR | Status: AC
  Administered 2014-10-12: 1 mg via INTRAVENOUS
  Filled 2014-10-12: qty 1

## 2014-10-12 MED ORDER — KETOROLAC TROMETHAMINE 30 MG/ML IJ SOLN
30.0000 mg | Freq: Once | INTRAMUSCULAR | Status: AC
  Administered 2014-10-12: 30 mg via INTRAVENOUS
  Filled 2014-10-12: qty 1

## 2014-10-12 MED ORDER — DIPHENHYDRAMINE HCL 50 MG/ML IJ SOLN
50.0000 mg | Freq: Once | INTRAMUSCULAR | Status: AC
  Administered 2014-10-12: 50 mg via INTRAVENOUS
  Filled 2014-10-12: qty 1

## 2014-10-12 MED ORDER — METOCLOPRAMIDE HCL 5 MG/ML IJ SOLN: 10.0000 mg | Freq: Once | INTRAMUSCULAR | Status: DC

## 2014-10-12 MED ORDER — METOCLOPRAMIDE HCL 5 MG/ML IJ SOLN
10.0000 mg | Freq: Once | INTRAMUSCULAR | Status: AC
  Administered 2014-10-12: 10 mg via INTRAVENOUS
  Filled 2014-10-12: qty 2

## 2014-10-12 NOTE — Telephone Encounter (Signed)
Left message labs normal

## 2014-10-12 NOTE — ED Provider Notes (Signed)
CSN: [id number]     Arrival date & time 10/12/14  2058 History  This chart was scribe for Barbara LennertJoseph L Lashun Mccants, MD by Angelene GiovanniEmmanuella Mensah, ED Scribe. The patient was seen in room APA09/APA09 and the patient's care was started at 10:28 PM.    Chief Complaint  Patient presents with  . Headache    Patient is a 33 y.o. female presenting with headaches. The history is provided by the patient. No language interpreter was used.  Headache Radiates to:  Does not radiate Onset quality:  Gradual Timing:  Constant Chronicity:  New Similar to prior headaches: yes   Relieved by:  Nothing Associated symptoms: nausea   Associated symptoms: no abdominal pain, no back pain, no congestion, no cough, no diarrhea, no fatigue, no seizures and no sinus pressure    HPI Comments: Barbara Jennings is a 33 y.o. female who presents to the Emergency Department complaining of moderate constant headache that began a few hours PTA. She notes that the pain started when she smacked her head into the edge of a door of a cabinet. She reports associated nausea. She denies syncope after hitting her head and abdominal pain. She states that she has had similar headaches in the past but not with the same severity. She reports that she was given Benadryl, Reglan, and Dilaudid for those previous headaches with relief.    Past Medical History  Diagnosis Date  . Asthma   . Anxiety   . Panic attack   . Depression   . Migraine   . Abnormal Pap smear   . HSV-2 (herpes simplex virus 2) infection   . Vaginal Pap smear, abnormal   . Unspecified symptom associated with female genital organs 01/27/2014  . 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 Other and unspecified ovarian cyst 02/06/2014  . Hx of migraines 02/09/2014    Has ?aura, will rx POP  . Vulvar abscess 03/09/2014    [facility]rim ds x 10 days  . Abnormal  uterine bleeding (AUB) 10/11/2014  . Dysmenorrhea 10/11/2014   Past Surgical History  Procedure Laterality Date  . Cesarean section    . Ovarian cyst removal    . Appendectomy    . Cholecystectomy    . Dilation and curettage of uterus     Family History  Problem Relation Age of Onset  . Ulcers Father   . Cancer Maternal Aunt     breast   . Diabetes Maternal Grandmother   . Diabetes Maternal Grandfather   . Alzheimer's disease Paternal Grandmother   . Diabetes Maternal Aunt   . Cancer Paternal Aunt     cervical   History  Substance Use Topics  . Smoking status: Current Every Day Smoker -- 0.50 packs/day for 12 years    Types: Cigarettes  . Smokeless tobacco: Never Used  . Alcohol Use: Yes     Comment: socially   OB History   Grav Para Term Preterm Abortions TAB SAB Ect Mult Living   7 2 1 1 5  5   1      Review of Systems  Constitutional: Negative for appetite change and fatigue.  HENT: Negative for congestion, ear discharge and sinus pressure.   Eyes: Negative for discharge.  Respiratory: Negative for cough.   Cardiovascular: Negative for chest pain.  Gastrointestinal: Positive for nausea. Negative for abdominal  pain and diarrhea.  Genitourinary: Negative for frequency and hematuria.  Musculoskeletal: Negative for back pain.  Skin: Negative for rash.  Neurological: Positive for headaches. Negative for seizures.  Psychiatric/Behavioral: Negative for hallucinations.      Allergies  Coconut flavor  Home Medications   Prior to Admission medications   Medication Sig Start Date End Date Taking? Authorizing Provider  HYDROcodone-acetaminophen (NORCO/VICODIN) 5-325 MG per tablet Take 1 tablet by mouth every 6 (six) hours as needed (pain).   Yes Historical Provider, MD   BP 149/72  Pulse 72  Temp(Src) 98.9 F (37.2 C) (Oral)  Resp 20  Ht 5\' 6"  (1.676 m)  Wt 268 lb (121.564 kg)  BMI 43.28 kg/m2  SpO2 100%  LMP 10/09/2014 Physical Exam  Nursing note and  vitals reviewed. Constitutional: She is oriented to person, place, and time. She appears well-developed.  HENT:  Head: Normocephalic.  Mild tenderness to top of head  Eyes: Conjunctivae and EOM are normal. No scleral icterus.  Neck: Neck supple. No thyromegaly present.  Cardiovascular: Normal rate and regular rhythm.  Exam reveals no gallop and no friction rub.   No murmur heard. Pulmonary/Chest: No stridor. She has no wheezes. She has no rales. She exhibits no tenderness.  Abdominal: She exhibits no distension. There is no tenderness. There is no rebound.  Musculoskeletal: Normal range of motion. She exhibits no edema.  Lymphadenopathy:    She has no cervical adenopathy.  Neurological: She is oriented to person, place, and time. She exhibits normal muscle tone. Coordination normal.  Skin: No rash noted. No erythema.  Psychiatric: She has a normal mood and affect. Her behavior is normal.    ED Course  Procedures (including critical care time) DIAGNOSTIC STUDIES: Oxygen Saturation is 100% on RA, normal by my interpretation.    COORDINATION OF CARE: 10:30 PM- Pt advised of plan for treatment and pt agrees.  Labs Review Labs Reviewed - No data to display  Imaging Review No results found.   EKG Interpretation None      MDM   Final diagnoses:  None    The chart was scribed for me under my direct supervision.  I personally performed the history, physical, and medical decision making and all procedures in the evaluation of this patient.Barbara Jennings.   Macala Baldonado L Jacub Waiters, MD 10/15/14 [phone]

## 2014-10-12 NOTE — ED Notes (Addendum)
Headache, since struck lt side of head against a cabinet.  No LOC, nausea and"throbbing headache'  Dizzy  Took vicodin without relief.

## 2014-10-12 NOTE — ED Notes (Signed)
Report given to Ron RN.

## 2014-10-13 NOTE — Discharge Instructions (Signed)

## 2014-10-13 NOTE — ED Notes (Signed)
Pt alert & oriented x4, stable gait. Patient given discharge instructions, paperwork & prescription(s). Patient  instructed to stop at the registration desk to finish any additional paperwork. Patient verbalized understanding. Pt left department w/ no further questions. 

## 2014-10-18 ENCOUNTER — Ambulatory Visit (INDEPENDENT_AMBULATORY_CARE_PROVIDER_SITE_OTHER): Payer: Medicaid Other | Admitting: Adult Health

## 2014-10-18 ENCOUNTER — Ambulatory Visit (INDEPENDENT_AMBULATORY_CARE_PROVIDER_SITE_OTHER): Payer: Medicaid Other

## 2014-10-18 ENCOUNTER — Encounter: Payer: Self-pay | Admitting: Adult Health

## 2014-10-18 VITALS — BP 120/76 | Ht 66.0 in | Wt 285.0 lb

## 2014-10-18 DIAGNOSIS — N946 Dysmenorrhea, unspecified: Secondary | ICD-10-CM

## 2014-10-18 DIAGNOSIS — N939 Abnormal uterine and vaginal bleeding, unspecified: Secondary | ICD-10-CM

## 2014-10-18 DIAGNOSIS — R938 Abnormal findings on diagnostic imaging of other specified body structures: Secondary | ICD-10-CM

## 2014-10-18 DIAGNOSIS — N83209 Unspecified ovarian cyst, unspecified side: Secondary | ICD-10-CM

## 2014-10-18 DIAGNOSIS — R9389 Abnormal findings on diagnostic imaging of other specified body structures: Secondary | ICD-10-CM

## 2014-10-18 DIAGNOSIS — N83201 Unspecified ovarian cyst, right side: Secondary | ICD-10-CM

## 2014-10-18 DIAGNOSIS — N8320 Unspecified ovarian cysts: Secondary | ICD-10-CM

## 2014-10-18 HISTORY — DX: Unspecified ovarian cyst, unspecified side: N83.209

## 2014-10-18 HISTORY — DX: Abnormal findings on diagnostic imaging of other specified body structures: R93.89

## 2014-10-18 MED ORDER — MEDROXYPROGESTERONE ACETATE 10 MG PO TABS: 10.0000 mg | ORAL_TABLET | Freq: Every day | ORAL | Status: DC

## 2014-10-18 NOTE — Patient Instructions (Signed)
Take provera 10 mg x 14 days then return in 4 weeks for US and see me

## 2014-10-18 NOTE — Progress Notes (Signed)
Subjective:     Patient ID: Barbara Jennings, female   DOB: 08/05/1981, 33 y.o.   MRN: [id number]  HPI Barbara Jennings is a 33 year old white female in for US for AUB and dysmenorrhea.  Review of Systems See HPI Reviewed past medical,surgical, social and family history. Reviewed medications and allergies.     Objective:   Physical Exam BP 120/76  Ht 5\' 6"  (1.676 m)  Wt 285 lb (129.275 kg)  BMI 46.02 kg/m2  LMP 10/12/2015Reviewed US with pt and also discussed with Dr Emelda FearFerguson.   Uterus 7.9 x 5.4 x 4.9 cm, retroverted  Endometrium 20.3 mm, symmetrical, no obvious mass or focal area of thickening noted  Right ovary 3.5 x 2.8 x 2.5 cm, with 2.7 x 1.7 cm simple cyst noted (tender to palp with vaginal probe)  Left ovary 1.9 x 1.4 x 1.1 cm,  No free fluid noted  Technician Comments:  Retroverted uterus with 20.3 mm, symmetrical, no obvious mass or focal area of thickening noted, Rt ovary with 2.7 x 1.7 cm simple cyst noted (tender to palp with vaginal probe), Lt ovary appears WNL no free fluid noted within the pelvis  Will give provera 10 mg x 14 days to see if can cause the endometrium to shed off and then follow up with US, if thickness persists will get endometrial biopsy.    Assessment:     Thickened endometrium AUB Dysmenorrhea Right ovarian cyst    Plan:    Rx provera 10 mg #14 take 1 daily x 14 days Return in 4 weeks for US and see me

## 2014-10-23 ENCOUNTER — Telehealth: Payer: Self-pay | Admitting: Orthopedic Surgery

## 2014-10-23 NOTE — Telephone Encounter (Signed)
Relayed message to patient, states she has already been doing these things would like to go ahead and schedule appointment, appointment scheduled

## 2014-10-23 NOTE — Telephone Encounter (Signed)
The patient can take ibuprofen 800 mg 3 times a day as ice 3 times a day as needed for swelling if no improvement after 7 days i.e. next Monday she can call the office and make an appointment for November 10

## 2014-10-23 NOTE — Telephone Encounter (Signed)
She can be reached at [phone]

## 2014-10-23 NOTE — Telephone Encounter (Signed)
Patient is asking for pain med refill Rt Knee Swelled and she cant hardly walk needs HYDROcodone-acetaminophen (NORCO/VICODIN) 5-325 MG per Please advise?

## 2014-10-23 NOTE — Telephone Encounter (Signed)
Routing to Dr Harrison 

## 2014-10-30 ENCOUNTER — Encounter: Payer: Self-pay | Admitting: Adult Health

## 2014-11-09 ENCOUNTER — Ambulatory Visit: Payer: Medicaid Other | Admitting: Orthopedic Surgery

## 2014-11-13 ENCOUNTER — Encounter: Payer: Self-pay | Admitting: Orthopedic Surgery

## 2014-11-15 ENCOUNTER — Encounter: Payer: Self-pay | Admitting: Adult Health

## 2014-11-15 ENCOUNTER — Other Ambulatory Visit: Payer: Self-pay | Admitting: Adult Health

## 2014-11-15 ENCOUNTER — Ambulatory Visit (INDEPENDENT_AMBULATORY_CARE_PROVIDER_SITE_OTHER): Payer: Medicaid Other | Admitting: Adult Health

## 2014-11-15 ENCOUNTER — Ambulatory Visit (INDEPENDENT_AMBULATORY_CARE_PROVIDER_SITE_OTHER): Payer: Medicaid Other

## 2014-11-15 VITALS — BP 128/70 | Ht 66.0 in | Wt 283.0 lb

## 2014-11-15 DIAGNOSIS — R9389 Abnormal findings on diagnostic imaging of other specified body structures: Secondary | ICD-10-CM

## 2014-11-15 DIAGNOSIS — R938 Abnormal findings on diagnostic imaging of other specified body structures: Secondary | ICD-10-CM

## 2014-11-15 DIAGNOSIS — N8320 Unspecified ovarian cysts: Secondary | ICD-10-CM

## 2014-11-15 DIAGNOSIS — N83201 Unspecified ovarian cyst, right side: Secondary | ICD-10-CM

## 2014-11-15 NOTE — Progress Notes (Addendum)
Subjective:     Patient ID: Barbara Jennings, female   DOB: 01/05/1981, 33 y.o.   MRN: [id number]  HPI [name] is a 33 year old white female in for US for AUB and thickened endometrium.Bleeding stopped Thursday, and not bleed heavier after provera.  Review of Systems See HPI Reviewed past medical,surgical, social and family history. Reviewed medications and allergies.     Objective:   Physical Exam BP 128/70 mmHg  Ht 5\' 6"  (1.676 m)  Wt 283 lb (128.368 kg)  BMI 45.70 kg/m2Reviewed US with pt.   Uterus Retroverted   Endometrium 24.8 mm, Thickened hyperechoic appearance (although no obvious solitary mass noted within)  Right ovary 2.7 x 1.9 cm, no cyst noted on today's exam although patient is Very tender to palp with vaginal probe to Rt adnexa  No free fluid noted within the pelvis  Technician Comments:  Retroverted uterus noted with thickened hyperechoic endometrium noted, Rt ovary appears WNL although Very Tender to palp with vaginal probe to Rt adnexa, no free fluid noted within the pelvis Have already checked TSH and it is 1.423.   Will get endo biopsy.  Assessment:     Thickened endometrium    Plan:     Return 11/15 for endo biopsy with Dr Emelda FearFerguson   Review  Handout on endo biopsy

## 2014-11-15 NOTE — Patient Instructions (Addendum)
Return 11/12 for endo bx with Dr Emelda FearFerguson review handout on endo biospy

## 2014-11-16 ENCOUNTER — Telehealth: Payer: Self-pay | Admitting: Adult Health

## 2014-11-16 MED ORDER — HYDROCODONE-ACETAMINOPHEN 5-325 MG PO TABS: 1.0000 | ORAL_TABLET | Freq: Four times a day (QID) | ORAL | Status: DC | PRN

## 2014-11-16 NOTE — Telephone Encounter (Signed)
Complains of pain/cramps since US has endo biopsy scheduled for 11/25, requests refill on norco, will give 30.

## 2014-11-22 ENCOUNTER — Encounter: Payer: Self-pay | Admitting: Obstetrics and Gynecology

## 2014-11-22 ENCOUNTER — Ambulatory Visit (INDEPENDENT_AMBULATORY_CARE_PROVIDER_SITE_OTHER): Payer: Medicaid Other | Admitting: Obstetrics and Gynecology

## 2014-11-22 ENCOUNTER — Telehealth: Payer: Self-pay | Admitting: Obstetrics and Gynecology

## 2014-11-22 ENCOUNTER — Other Ambulatory Visit: Payer: Self-pay | Admitting: Obstetrics and Gynecology

## 2014-11-22 VITALS — BP 130/82 | Ht 66.0 in | Wt 283.0 lb

## 2014-11-22 DIAGNOSIS — R9389 Abnormal findings on diagnostic imaging of other specified body structures: Secondary | ICD-10-CM

## 2014-11-22 DIAGNOSIS — Z3200 Encounter for pregnancy test, result unknown: Secondary | ICD-10-CM

## 2014-11-22 DIAGNOSIS — Z3202 Encounter for pregnancy test, result negative: Secondary | ICD-10-CM

## 2014-11-22 DIAGNOSIS — R934 Abnormal findings on diagnostic imaging of urinary organs: Secondary | ICD-10-CM

## 2014-11-22 DIAGNOSIS — N939 Abnormal uterine and vaginal bleeding, unspecified: Secondary | ICD-10-CM

## 2014-11-22 LAB — POCT URINE PREGNANCY: Preg Test, Ur: NEGATIVE

## 2014-11-22 MED ORDER — IBUPROFEN 600 MG PO TABS: 600.0000 mg | ORAL_TABLET | Freq: Four times a day (QID) | ORAL | Status: DC | PRN

## 2014-11-22 NOTE — Progress Notes (Signed)
Endometrial Biopsy: Patient given informed consent, signed copy in the chart, time out was performed. Time out taken. The patient was placed in the lithotomy position and the cervix brought into view with sterile speculum. Portio of cervix cleansed x 2 with betadine swabs.  A tenaculum was placed in the anterior lip of the cervix. The uterus was sounded for depth of 8 cm,. Milex uterine Explora 3 mm was introduced to into the uterus, suction created,  and an endometrial sample was obtained. All equipment was removed and accounted for.   The patient tolerated the procedure well.   A:  1. Endometrium thickened to 24.8 mm.  2. AUB   P: 1. F/U for results next week  Patient given post procedure instructions.   This chart was scribed for Tilda BurrowJohn Krishawna Stiefel V, MD by Chestine SporeSoijett Blue, ED Scribe. The patient was seen in room 2 at 9:39 AM.   12/03/14 Pathology results: benign endometrium with progestestational effect.

## 2014-11-22 NOTE — Progress Notes (Signed)
Patient ID: Barbara Jennings, female   DOB: 12/01/1981, 33 y.o.   MRN: [id number] Pt here today for endometrial biopsy. Pt has a negative UPT today. Pt given consent to read over and sign, procedure explained.

## 2014-11-22 NOTE — Telephone Encounter (Signed)
Pt had an endometrial biopsy earlier today and called needing ibuprofen for the pain. I spoke with Dr.Ferguson and he gave a verbal order for Ibuprofen 600mg  1 po q 6 hours PRN for pain, disp 30 with 2 refills. I LMOM for the pt to check with her pharmacy.

## 2014-11-27 ENCOUNTER — Telehealth: Payer: Self-pay | Admitting: Obstetrics and Gynecology

## 2014-11-27 NOTE — Telephone Encounter (Signed)
Pt aware of results. Has an appointment with JAG on Wednesday.

## 2014-11-29 ENCOUNTER — Ambulatory Visit (INDEPENDENT_AMBULATORY_CARE_PROVIDER_SITE_OTHER): Payer: Medicaid Other | Admitting: Adult Health

## 2014-11-29 ENCOUNTER — Encounter: Payer: Self-pay | Admitting: Adult Health

## 2014-11-29 VITALS — BP 120/80 | Ht 66.0 in | Wt 286.0 lb

## 2014-11-29 DIAGNOSIS — N939 Abnormal uterine and vaginal bleeding, unspecified: Secondary | ICD-10-CM

## 2014-11-29 DIAGNOSIS — R938 Abnormal findings on diagnostic imaging of other specified body structures: Secondary | ICD-10-CM

## 2014-11-29 DIAGNOSIS — N946 Dysmenorrhea, unspecified: Secondary | ICD-10-CM

## 2014-11-29 DIAGNOSIS — B977 Papillomavirus as the cause of diseases classified elsewhere: Secondary | ICD-10-CM

## 2014-11-29 DIAGNOSIS — R9389 Abnormal findings on diagnostic imaging of other specified body structures: Secondary | ICD-10-CM

## 2014-11-29 DIAGNOSIS — R8789 Other abnormal findings in specimens from female genital organs: Secondary | ICD-10-CM

## 2014-11-29 DIAGNOSIS — IMO0002 Reserved for concepts with insufficient information to code with codable children: Secondary | ICD-10-CM

## 2014-11-29 MED ORDER — HYDROCODONE-ACETAMINOPHEN 5-325 MG PO TABS: 1.0000 | ORAL_TABLET | Freq: Four times a day (QID) | ORAL | Status: DC | PRN

## 2014-11-29 NOTE — Patient Instructions (Signed)
Return in 1 week to talk with Dr Emelda FearFerguson about surgical options

## 2014-11-29 NOTE — Progress Notes (Signed)
Subjective:     Patient ID: Barbara Jennings, female   DOB: 10/31/1981, 33 y.o.   MRN: [id number]  HPI [name] is a 33 year old white female sp endo biopsy in for results.  Review of Systems See HPI Reviewed past medical,surgical, social and family history. Reviewed medications and allergies.     Objective:   Physical Exam BP 120/80 mmHg  Ht 5\' 6"  (1.676 m)  Wt 286 lb (129.729 kg)  BMI 46.18 kg/m2  LMP 11/22/2014   Reviewed endo biopsy with pt, it showed benign endometrium with progestational effect, no hyperplasia atypia or malignancy seen.She says she is tired of bleeding and having pain and wants surgical intervention, discussed ablation, and hysterectomy as option.She does not want any more children.  Assessment:     AUB Thickened endometrium Dysmenorrhea History of abnormal pap with +HPV    Plan:     Review handout on endo ablation Follow up with Dr Emelda FearFerguson in 1 week to discuss surgical options

## 2014-12-07 ENCOUNTER — Ambulatory Visit (INDEPENDENT_AMBULATORY_CARE_PROVIDER_SITE_OTHER): Payer: Medicaid Other | Admitting: Obstetrics and Gynecology

## 2014-12-07 ENCOUNTER — Encounter: Payer: Self-pay | Admitting: Obstetrics and Gynecology

## 2014-12-07 VITALS — BP 120/80 | Ht 66.0 in | Wt 282.0 lb

## 2014-12-07 DIAGNOSIS — N939 Abnormal uterine and vaginal bleeding, unspecified: Secondary | ICD-10-CM

## 2014-12-07 MED ORDER — MEGESTROL ACETATE 40 MG PO TABS: 40.0000 mg | ORAL_TABLET | Freq: Every day | ORAL | Status: DC

## 2014-12-07 NOTE — Progress Notes (Signed)
Patient ID: Barbara Jennings, female   DOB: 10/12/1981, 33 y.o.   MRN: [id number] Pt here today to discuss surgery.

## 2014-12-07 NOTE — Progress Notes (Signed)
Patient ID: Barbara Jennings, female   DOB: 10/10/1981, 33 y.o.   MRN: [id number]   [facility]  Patient name: Barbara Jennings MRN [id number]  Date of birth: 03/17/1981  CC & HPI:  Barbara Jennings is a 33 y.o. female presenting today to discuss the possibility of getting a hysterectomy.  Her endometrial biopsy came back benign.  She did not experience any relief of her vaginal bleeding while taking Provera.  She did experience relief to her vaginal bleeding while taking Megace.  She describes her abdominal cramping as intermittent but states that is severe with her menses.  She does not want anymore children.  Patient not interested at all in an endometrial ablation procedure.      ROS:  All systems have been reviewed and are negative unless otherwise indicated in the HPI.   Pertinent History Reviewed:   Reviewed: Significant for  Medical         Past Medical History  Diagnosis Date  . Asthma   . Anxiety   . Panic attack   . Depression   . Migraine   . Abnormal Pap smear   . HSV-2 (herpes simplex virus 2) infection   . Vaginal Pap smear, abnormal   . Unspecified symptom associated with female genital organs 01/27/2014  . 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 Other and unspecified ovarian cyst 02/06/2014  . Hx of migraines 02/09/2014    Has ?aura, will rx POP  . Vulvar abscess 03/09/2014    [facility] was rx'd bactrim ds x 10 days  . Abnormal uterine bleeding (AUB) 10/11/2014  . Dysmenorrhea 10/11/2014  . Thickened endometrium 10/18/2014  . Ovarian cyst 10/18/2014                              Surgical Hx:    Past Surgical History  Procedure Laterality Date  . Cesarean section    . Ovarian cyst removal    . Appendectomy    . Cholecystectomy    . Dilation and curettage of uterus     Medications: Reviewed & Updated - Jennings associated section     Current outpatient prescriptions: HYDROcodone-acetaminophen (NORCO/VICODIN) 5-325 MG per tablet, Take 1 tablet by mouth every 6 (six) hours as needed for moderate pain., Disp: 30 tablet, Rfl: 0;  ibuprofen (ADVIL,MOTRIN) 600 MG tablet, Take 1 tablet (600 mg total) by mouth every 6 (six) hours as needed., Disp: 30 tablet, Rfl: 2;  [DISCONTINUED] citalopram (CELEXA) 20 MG tablet, Take 20 mg by mouth daily.  , Disp: , Rfl:    Social History: Reviewed -  reports that she has been smoking Cigarettes.  She has a 6 pack-year smoking history. She has never used smokeless tobacco.  Objective Findings:  Vitals: Blood pressure 120/80, height 5\' 6"  (1.676 m), weight 282 lb (127.914 kg), last menstrual period 11/22/2014.  Physical Examination: Discussion only.   Assessment & Plan:   A:  1. AUB 2. Dysmenorrhea  3. Desire for permanent sterilization 4. Refusal to consider Endometrial ablation  P:  1. Supracervical hysterectomy 2. Megace supression of AUB til procedure  This chart was scribed for Tilda BurrowJohn Caidin Heidenreich V, MD by Carl Bestelina Holson, ED Scribe. The patient's care was started at 11:37 AM.

## 2014-12-07 NOTE — Patient Instructions (Signed)
Hysterectomy Information  A hysterectomy is a surgery in which your uterus is removed. This surgery may be done to treat various medical problems. After the surgery, you will no longer have menstrual periods. The surgery will also make you unable to become pregnant (sterile). The fallopian tubes and ovaries can be removed (bilateral salpingo-oophorectomy) during this surgery as well.  REASONS FOR A HYSTERECTOMY  Persistent, abnormal bleeding.  Lasting (chronic) pelvic pain or infection.  The lining of the uterus (endometrium) starts growing outside the uterus (endometriosis).  The endometrium starts growing in the muscle of the uterus (adenomyosis).  The uterus falls down into the vagina (pelvic organ prolapse).  Noncancerous growths in the uterus (uterine fibroids) that cause symptoms.  Precancerous cells.  Cervical cancer or uterine cancer. TYPES OF HYSTERECTOMIES  Supracervical hysterectomy--In this type, the top part of the uterus is removed, but not the cervix.  Total hysterectomy--The uterus and cervix are removed.  Radical hysterectomy--The uterus, the cervix, and the fibrous tissue that holds the uterus in place in the pelvis (parametrium) are removed. WAYS A HYSTERECTOMY CAN BE PERFORMED  Abdominal hysterectomy--A large surgical cut (incision) is made in the abdomen. The uterus is removed through this incision.  Vaginal hysterectomy--An incision is made in the vagina. The uterus is removed through this incision. There are no abdominal incisions.  Conventional laparoscopic hysterectomy--Three or four small incisions are made in the abdomen. A thin, lighted tube with a camera (laparoscope) is inserted into one of the incisions. Other tools are put through the other incisions. The uterus is cut into small pieces. The small pieces are removed through the incisions, or they are removed through the vagina.  Laparoscopically assisted vaginal hysterectomy (LAVH)--Three or four  small incisions are made in the abdomen. Part of the surgery is performed laparoscopically and part vaginally. The uterus is removed through the vagina.  Robot-assisted laparoscopic hysterectomy--A laparoscope and other tools are inserted into 3 or 4 small incisions in the abdomen. A computer-controlled device is used to give the surgeon a 3D image and to help control the surgical instruments. This allows for more precise movements of surgical instruments. The uterus is cut into small pieces and removed through the incisions or removed through the vagina. RISKS AND COMPLICATIONS  Possible complications associated with this procedure include:  Bleeding and risk of blood transfusion. Tell your health care provider if you do not want to receive any blood products.  Blood clots in the legs or lung.  Infection.  Injury to surrounding organs.  Problems or side effects related to anesthesia.  Conversion to an abdominal hysterectomy from one of the other techniques. WHAT TO EXPECT AFTER A HYSTERECTOMY  You will be given pain medicine.  You will need to have someone with you for the first 3-5 days after you go home.  You will need to follow up with your surgeon in 2-4 weeks after surgery to evaluate your progress.  You may have early menopause symptoms such as hot flashes, night sweats, and insomnia.  If you had a hysterectomy for a problem that was not cancer or not a condition that could lead to cancer, then you no longer need Pap tests. However, even if you no longer need a Pap test, a regular exam is a good idea to make sure no other problems are starting. Document Released: 06/10/2001 Document Revised: 10/05/2013 Document Reviewed: 08/22/2013 ExitCare Patient Information 2015 [employer]. This information is not intended to replace advice given to you by your health care   provider. Make sure you discuss any questions you have with your health care provider.  

## 2014-12-13 ENCOUNTER — Ambulatory Visit (INDEPENDENT_AMBULATORY_CARE_PROVIDER_SITE_OTHER): Payer: Medicaid Other | Admitting: Obstetrics and Gynecology

## 2014-12-13 VITALS — BP 120/80 | Ht 66.0 in | Wt 282.0 lb

## 2014-12-13 DIAGNOSIS — J069 Acute upper respiratory infection, unspecified: Secondary | ICD-10-CM

## 2014-12-13 MED ORDER — HYDROCODONE-HOMATROPINE 5-1.5 MG/5ML PO SYRP: 5.0000 mL | ORAL_SOLUTION | Freq: Four times a day (QID) | ORAL | Status: DC | PRN

## 2014-12-13 NOTE — Progress Notes (Signed)
Preoperative History and Physical  Barbara Jennings is a 33 y.o. [phone] with Patient's last menstrual period was 11/22/2014.   Barbara Jennings is a [age] y.o. female presenting today to discuss the possibility of getting a hysterectomy. Her endometrial biopsy came back benign. She did not experience any relief of her vaginal bleeding while taking Provera. She did experience relief to her vaginal bleeding while taking Megace. She describes her abdominal cramping as intermittent but states that is severe with her menses. She does not want anymore children. Patient not interested at all in an endometrial ablation procedure.   PMH:    Past Medical History  Diagnosis Date  . Asthma   . Anxiety   . Panic attack   . Depression   . Migraine   . Abnormal Pap smear   . HSV-2 (herpes simplex virus 2) infection   . Vaginal Pap smear, abnormal   . Unspecified symptom associated with female genital organs 01/27/2014  . 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 Other and unspecified ovarian cyst 02/06/2014  . Hx of migraines 02/09/2014    Has ?aura, will rx POP  . Vulvar abscess 03/09/2014    [facility] rx'd bactrim ds x 10 days  . Abnormal uterine bleeding (AUB) 10/11/2014  . Dysmenorrhea 10/11/2014  . Thickened endometrium 10/18/2014  . Ovarian cyst 10/18/2014    PSH:     Past Surgical History  Procedure Laterality Date  . Cesarean section    . Ovarian cyst removal    . Appendectomy    . Cholecystectomy    . Dilation and curettage of uterus      POb/GynH:      OB History    Gravida Para Term Preterm AB TAB SAB Ectopic Multiple Living   7 2 1 1 5  5   2       SH:   History  Substance Use Topics  . Smoking status: Current Every Day Smoker -- 0.50 packs/day for 12 years    Types: Cigarettes  . Smokeless tobacco: Never Used  . Alcohol Use: Yes     Comment: socially     FH:    Family History  Problem Relation Age of Onset  . Ulcers Father   . Cancer Maternal Aunt     breast   . Diabetes Maternal Grandmother   . Diabetes Maternal Grandfather   . Alzheimer's disease Paternal Grandmother   . Diabetes Maternal Aunt   . Cancer Paternal Aunt     cervical     Allergies:  Allergies  Allergen Reactions  . Coconut Flavor Anaphylaxis    Medications:      Current outpatient prescriptions: ibuprofen (ADVIL,MOTRIN) 600 MG tablet, Take 1 tablet (600 mg total) by mouth every 6 (six) hours as needed., Disp: 30 tablet, Rfl: 2;  megestrol (MEGACE) 40 MG tablet, Take 1 tablet (40 mg total) by mouth daily., Disp: 30 tablet, Rfl: 1 HYDROcodone-acetaminophen (NORCO/VICODIN) 5-325 MG per tablet, Take 1 tablet by mouth every 6 (six) hours as needed for moderate pain. (Patient not taking: Reported on 12/08/2014), Disp: 30 tablet, Rfl: 0;  [DISCONTINUED] citalopram (CELEXA) 20 MG tablet, Take 20 mg by mouth daily.  , Disp: , Rfl:   Review of Systems:   Review of Systems  +AUB +dysmenorrhea No other complaints.    PHYSICAL EXAM:  Blood pressure 120/80, height  5\' 6"  (1.676 m), weight 282 lb (127.914 kg), last menstrual period 11/22/2014. Physical Examination:  Pelvic - normal external genitalia, vulva, vagina, cervix, uterus and adnexa,  VULVA: normal appearing vulva with no masses, tenderness or lesions,  VAGINA: normal appearing vagina with normal color and discharge, no lesions, CERVIX: normal appearing cervix without discharge or lesions,  UTERUS: uterus is normal size, shape, consistency and nontender, anteverted, enlarged to 8 week's size,  ADNEXA: normal adnexa in size, nontender and no masses     Labs: No results found for this or any previous visit (from the past 336 hour(s)).  EKG: Orders placed or performed during the hospital encounter of 11/02/12  . ED EKG  . ED EKG  . EKG 12-Lead  . EKG 12-Lead  . EKG    Imaging Studies: [place] Pelvis  Limited  11/15/2014   FOLLOW UP GYNECOLOGIC SONOGRAM   Barbara Jennings is a 33 y.o. [phone] for a pelvic sonogram for follow up  thickened endometrium and Rt ovarian cyst s/p Provera cycle.  Uterus                      Retroverted   Endometrium          24.8 mm, Thickened hyperechoic appearance (although  no obvious solitary mass noted within)  Right ovary             2.7 x 1.9  cm, no cyst noted on today's exam  although patient is Very tender to palp with vaginal probe to Rt adnexa  No free fluid noted within the pelvis  Technician Comments:  Retroverted uterus noted with thickened hyperechoic endometrium noted, Rt  ovary appears WNL although Very Tender to palp with vaginal probe to Rt  adnexa, no free fluid noted within the pelvis   Chari ManningMcBride, Tasha 11/15/2014 10:11 AM       Assessment:  Upper respiratory infection. 1. AUB  2. Dysmenorrhea  3. Desire for permanent sterilization  4. Refusal to consider Endometrial ablation   Plan:  1. Supracervical hysterectomy to be rescheduled for 01/02/15 2. Megace supression of AUB til procedure 3. Pt to have preop CV exam 12/29  This chart was scribed for Tilda BurrowJohn Jams Trickett V, MD by Chestine SporeSoijett Blue, ED Scribe. The patient was seen in room 1 at 2:25 PM.

## 2014-12-13 NOTE — Patient Instructions (Signed)
Leighton Parodyracy M Westerfeld  12/13/2014   Your procedure is scheduled on:  12/19/2014  Report to [facility] at  615  AM.  Call this number if you have problems the morning of surgery: [phone]   Remember:   Do not eat food or drink liquids after midnight.   Take these medicines the morning of surgery with A SIP OF WATER:  hydrocodone   Do not wear jewelry, make-up or nail polish.  Do not wear lotions, powders, or perfumes.   Do not shave 48 hours prior to surgery. Men may shave face and neck.  Do not bring valuables to the hospital.  [facility] belongings or valuables.               Contacts, dentures or bridgework may not be worn into surgery.  Leave suitcase in the car. After surgery it may be brought to your room.  For patients admitted to the hospital, discharge time is determined by your  treatment team.               Patients discharged the day of surgery will not be allowed to drive home.  Name and phone number of your driver: family  Special Instructions: Shower using CHG 2 nights before surgery and the night before surgery.  If you shower the day of surgery use CHG.  Use special wash - you have one bottle of CHG for all showers.  You should use approximately 1/3 of the bottle for each shower.   Please read over the following fact sheets that you were given: Pain Booklet, Coughing and Deep Breathing, Surgical Site Infection Prevention, Anesthesia Post-op Instructions and Care and Recovery After Surgery Hysterectomy Information  A hysterectomy is a surgery in which your uterus is removed. This surgery may be done to treat various medical problems. After the surgery, you will no longer have menstrual periods. The surgery will also make you unable to become pregnant (sterile). The fallopian tubes and ovaries can be removed (bilateral salpingo-oophorectomy) during this surgery as well.  REASONS FOR A HYSTERECTOMY  Persistent, abnormal  bleeding.  Lasting (chronic) pelvic pain or infection.  The lining of the uterus (endometrium) starts growing outside the uterus (endometriosis).  The endometrium starts growing in the muscle of the uterus (adenomyosis).  The uterus falls down into the vagina (pelvic organ prolapse).  Noncancerous growths in the uterus (uterine fibroids) that cause symptoms.  Precancerous cells.  Cervical cancer or uterine cancer. TYPES OF HYSTERECTOMIES  Supracervical hysterectomy--In this type, the top part of the uterus is removed, but not the cervix.  Total hysterectomy--The uterus and cervix are removed.  Radical hysterectomy--The uterus, the cervix, and the fibrous tissue that holds the uterus in place in the pelvis (parametrium) are removed. WAYS A HYSTERECTOMY CAN BE PERFORMED  Abdominal hysterectomy--A large surgical cut (incision) is made in the abdomen. The uterus is removed through this incision.  Vaginal hysterectomy--An incision is made in the vagina. The uterus is removed through this incision. There are no abdominal incisions.  Conventional laparoscopic hysterectomy--Three or four small incisions are made in the abdomen. A thin, lighted tube with a camera (laparoscope) is inserted into one of the incisions. Other tools are put through the other incisions. The uterus is cut into small pieces. The small pieces are removed through the incisions, or they are removed through the vagina.  Laparoscopically assisted vaginal hysterectomy (LAVH)--Three or four small incisions are made in  the abdomen. Part of the surgery is performed laparoscopically and part vaginally. The uterus is removed through the vagina.  Robot-assisted laparoscopic hysterectomy--A laparoscope and other tools are inserted into 3 or 4 small incisions in the abdomen. A computer-controlled device is used to give the surgeon a 3D image and to help control the surgical instruments. This allows for more precise movements of  surgical instruments. The uterus is cut into small pieces and removed through the incisions or removed through the vagina. RISKS AND COMPLICATIONS  Possible complications associated with this procedure include:  Bleeding and risk of blood transfusion. Tell your health care provider if you do not want to receive any blood products.  Blood clots in the legs or lung.  Infection.  Injury to surrounding organs.  Problems or side effects related to anesthesia.  Conversion to an abdominal hysterectomy from one of the other techniques. WHAT TO EXPECT AFTER A HYSTERECTOMY  You will be given pain medicine.  You will need to have someone with you for the first 3-5 days after you go home.  You will need to follow up with your surgeon in 2-4 weeks after surgery to evaluate your progress.  You may have early menopause symptoms such as hot flashes, night sweats, and insomnia.  If you had a hysterectomy for a problem that was not cancer or not a condition that could lead to cancer, then you no longer need Pap tests. However, even if you no longer need a Pap test, a regular exam is a good idea to make sure no other problems are starting. Document Released: 06/10/2001 Document Revised: 10/05/2013 Document Reviewed: 08/22/2013 [facility] Patient Information 2015 [place], [place]. This information is not intended to replace advice given to you by your health care provider. Make sure you discuss any questions you have with your health care provider. Supracervical Hysterectomy A supracervical hysterectomy is surgery to remove the top part of the uterus, but not the cervix. You will no longer have menstrual periods or be able to get pregnant after this surgery. The fallopian tubes and ovaries may also be removed (bilateral salpingo-oophorectomy) during this surgery. This surgery is usually performed using a minimally invasive technique called laparoscopy. This technique allows the surgery to be done through  small incisions. The minimally invasive technique provides benefits such as less pain, less risk of infection, and shorter recovery time. LET [facility] PROVIDER KNOW ABOUT:  Any allergies you have.  All medicines you are taking, including vitamins, herbs, eye drops, creams, and over-the-counter medicines.  Previous problems you or members of your family have had with the use of anesthetics.  Any blood disorders you have.  Previous surgeries you have had.  Medical conditions you have. RISKS AND COMPLICATIONS  Generally, this is a safe procedure. However, as with any procedure, complications can occur. Possible complications include:  Bleeding.  Blood clots in the legs or lung.  Infection.  Injury to surrounding organs.  Problems related to anesthesia.  Conversion to an open abdominal surgery.  Additional surgery later to remove the cervix if you have problems with the cervix. BEFORE THE PROCEDURE  Ask your health care provider about changing or stopping your regular medicines.  Do not take aspirin or blood thinners (anticoagulants) for 1 week before the surgery, or as directed by your health care provider.  Do not eat or drink anything for 8 hours before the surgery, or as directed by your health care provider.  Quit smoking if you smoke.  Arrange for  a ride home after surgery and for someone to help you at home during recovery. PROCEDURE   You will be given an antibiotic medicine.  An IV tube will be placed in one of your veins. You will be given medicine to make you sleep (general anesthetic).  A gas (carbon dioxide) will be used to inflate your abdomen. This will allow your surgeon to look inside your abdomen, perform your surgery, and treat any other problems found if necessary.  Three or four small incisions will be made in your abdomen. One of these incisions will be made in the area of your belly button (navel). A thin, flexible tube with a tiny camera  and light on the end of it (laparoscope) will be inserted into the incision. The camera on the laparoscope sends a picture to a TV screen in the operating room. This gives your surgeon a good view inside the abdomen.  Other surgical instruments will be inserted through the other incisions.  The uterus will be cut into small pieces and removed through the small incisions.  Your incisions will be closed. AFTER THE PROCEDURE   You will be taken to a recovery area where your progress will be monitored until you are awake, stable, and taking fluids well. If there are no other problems, you will then be moved to a regular hospital room, or you will be allowed to go home.  You will likely have minimal discomfort after the surgery because the incisions are so small with the laparoscopic technique.  You will be given pain medicine while you are in the hospital and for when you go home.  If a bilateral salpingo-oophorectomy was performed before menopause, you will go through a sudden (abrupt) menopause. This can be helped with hormone medicines. Document Released: 06/02/2008 Document Revised: 10/05/2013 Document Reviewed: 06/17/2013 [facility] Patient Information 2015 [place], [place]. This information is not intended to replace advice given to you by your health care provider. Make sure you discuss any questions you have with your health care provider. PATIENT INSTRUCTIONS POST-ANESTHESIA  IMMEDIATELY FOLLOWING SURGERY:  Do not drive or operate machinery for the first twenty four hours after surgery.  Do not make any important decisions for twenty four hours after surgery or while taking narcotic pain medications or sedatives.  If you develop intractable nausea and vomiting or a severe headache please notify your doctor immediately.  FOLLOW-UP:  Please make an appointment with your surgeon as instructed. You do not need to follow up with anesthesia unless specifically instructed to do so.  WOUND CARE  INSTRUCTIONS (if applicable):  Keep a dry clean dressing on the anesthesia/puncture wound site if there is drainage.  Once the wound has quit draining you may leave it open to air.  Generally you should leave the bandage intact for twenty four hours unless there is drainage.  If the epidural site drains for more than 36-48 hours please call the anesthesia department.  QUESTIONS?:  Please feel free to call your physician or the hospital operator if you have any questions, and they will be happy to assist you.

## 2014-12-14 ENCOUNTER — Encounter (HOSPITAL_COMMUNITY)
Admission: RE | Admit: 2014-12-14 | Discharge: 2014-12-14 | Disposition: A | Discharge status: Home / Self Care | Payer: Medicaid Other | Source: Ambulatory Visit | Attending: Obstetrics and Gynecology | Admitting: Obstetrics and Gynecology

## 2014-12-25 NOTE — Patient Instructions (Addendum)
Barbara Jennings  12/25/2014   Your procedure is scheduled on:  01/02/15  [facility].  Call this number if you have problems the morning of surgery: [phone]   Remember:   Do not eat food or drink liquids after midnight.   Take these medicines the morning of surgery with A SIP OF WATER:  None   Do not wear jewelry, make-up or nail polish.  Do not wear lotions, powders, or perfumes. You may wear deodorant.  Do not shave 48 hours prior to surgery. Men may shave face and neck.  Do not bring valuables to the hospital.  [facility] any belongings or valuables.               Contacts, dentures or bridgework may not be worn into surgery.  Leave suitcase in the car. After surgery it may be brought to your room.  For patients admitted to the hospital, discharge time is determined by your treatment team.               Patients discharged the day of surgery will not be allowed to drive home.    Special Instructions: Shower using Hibiclens (CHG bath) the night before surgery and the morning of surgery. Use the one bottle for both showers.   Please read over the following fact sheets that you were given: Anesthesia Post-op Instructions and Care and Recovery After Surgery    Supracervical Hysterectomy A supracervical hysterectomy is surgery to remove the top part of the uterus, but not the cervix. You will no longer have menstrual periods or be able to get pregnant after this surgery. The fallopian tubes and ovaries may also be removed (bilateral salpingo-oophorectomy) during this surgery. This surgery is usually performed using a minimally invasive technique called laparoscopy. This technique allows the surgery to be done through small incisions. The minimally invasive technique provides benefits such as less pain, less risk of infection, and shorter recovery time. LET [facility] PROVIDER KNOW ABOUT:  Any allergies you have.  All medicines you  are taking, including vitamins, herbs, eye drops, creams, and over-the-counter medicines.  Previous problems you or members of your family have had with the use of anesthetics.  Any blood disorders you have.  Previous surgeries you have had.  Medical conditions you have. RISKS AND COMPLICATIONS  Generally, this is a safe procedure. However, as with any procedure, complications can occur. Possible complications include:  Bleeding.  Blood clots in the legs or lung.  Infection.  Injury to surrounding organs.  Problems related to anesthesia.  Conversion to an open abdominal surgery.  Additional surgery later to remove the cervix if you have problems with the cervix. BEFORE THE PROCEDURE  Ask your health care provider about changing or stopping your regular medicines.  Do not take aspirin or blood thinners (anticoagulants) for 1 week before the surgery, or as directed by your health care provider.  Do not eat or drink anything for 8 hours before the surgery, or as directed by your health care provider.  Quit smoking if you smoke.  Arrange for a ride home after surgery and for someone to help you at home during recovery. PROCEDURE   You will be given an antibiotic medicine.  An IV tube will be placed in one of your veins. You will be given medicine to make you sleep (general anesthetic).  A gas (carbon dioxide) will be used to inflate your abdomen. This will  allow your surgeon to look inside your abdomen, perform your surgery, and treat any other problems found if necessary.  Three or four small incisions will be made in your abdomen. One of these incisions will be made in the area of your belly button (navel). A thin, flexible tube with a tiny camera and light on the end of it (laparoscope) will be inserted into the incision. The camera on the laparoscope sends a picture to a TV screen in the operating room. This gives your surgeon a good view inside the abdomen.  Other  surgical instruments will be inserted through the other incisions.  The uterus will be cut into small pieces and removed through the small incisions.  Your incisions will be closed. AFTER THE PROCEDURE   You will be taken to a recovery area where your progress will be monitored until you are awake, stable, and taking fluids well. If there are no other problems, you will then be moved to a regular hospital room, or you will be allowed to go home.  You will likely have minimal discomfort after the surgery because the incisions are so small with the laparoscopic technique.  You will be given pain medicine while you are in the hospital and for when you go home.  If a bilateral salpingo-oophorectomy was performed before menopause, you will go through a sudden (abrupt) menopause. This can be helped with hormone medicines. Document Released: 06/02/2008 Document Revised: 10/05/2013 Document Reviewed: 06/17/2013 [facility] Patient Information 2015 [place], [place]. This information is not intended to replace advice given to you by your health care provider. Make sure you discuss any questions you have with your health care provider.    Supracervical Hysterectomy, Care After Refer to this sheet in the next few weeks. These instructions provide you with information on caring for yourself after your procedure. Your health care provider may also give you more specific instructions. Your treatment has been planned according to current medical practices, but problems sometimes occur. Call your health care provider if you have any problems or questions after your procedure.  WHAT TO EXPECT AFTER THE PROCEDURE After your procedure, it is typical to have some discomfort, tenderness, swelling, and bruising at the surgical sites. This normally lasts for about 2 weeks.  HOME CARE INSTRUCTIONS   Get plenty of rest and sleep.  Only take over-the-counter or prescription medicines as directed by your health care  provider.  Do not take aspirin. It can cause bleeding.  Do not drive until your health care provider approves.  Follow your health care provider's advice regarding exercise, lifting, and general activities.  Resume your usual diet as directed by your health care provider.  Do not douche, use tampons, or have sexual intercourse for at least 6 weeks or until your health care provider gives you permission.  Change your bandages (dressings) only as directed by your health care provider.  Monitor your temperature.  Take showers instead of baths for 2-3 weeks or as directed by your health care provider.  Drink enough fluids to keep your urine clear or pale yellow.  Do not drink alcohol until your health care provider gives you permission.  If you are constipated, you may take a mild laxative if your health care provider approves. Bran foods may also help with constipation problems.  Try to have someone home with you for 1-2 weeks to help with activities.  Follow up with your health care provider as directed. SEEK MEDICAL CARE IF:  You have swelling, redness,  or increasing pain in the incision area.  You have pus coming from an incision.  You notice a bad smell coming from the incision or dressing.  You have swelling, redness, or pain in the area around the IV site.  Your incision breaks open.  You feel dizzy or lightheaded.  You have pain or bleeding when you urinate.  You have persistent diarrhea.  You have persistent nausea and vomiting.  You have abnormal vaginal discharge.  You have a rash.  Your pain is not controlled with your prescribed medicine. SEEK IMMEDIATE MEDICAL CARE IF:  You have a fever.  You have severe abdominal pain.  You have chest pain.  You have shortness of breath.  You faint.  You have pain, swelling, or redness in your leg.  You have heavy vaginal bleeding with blood clots. Document Released: 10/05/2013 Document Reviewed:  10/05/2013 [facility] Patient Information 2015 [place], [place]. This information is not intended to replace advice given to you by your health care provider. Make sure you discuss any questions you have with your health care provider.    PATIENT INSTRUCTIONS POST-ANESTHESIA  IMMEDIATELY FOLLOWING SURGERY:  Do not drive or operate machinery for the first twenty four hours after surgery.  Do not make any important decisions for twenty four hours after surgery or while taking narcotic pain medications or sedatives.  If you develop intractable nausea and vomiting or a severe headache please notify your doctor immediately.  FOLLOW-UP:  Please make an appointment with your surgeon as instructed. You do not need to follow up with anesthesia unless specifically instructed to do so.  WOUND CARE INSTRUCTIONS (if applicable):  Keep a dry clean dressing on the anesthesia/puncture wound site if there is drainage.  Once the wound has quit draining you may leave it open to air.  Generally you should leave the bandage intact for twenty four hours unless there is drainage.  If the epidural site drains for more than 36-48 hours please call the anesthesia department.  QUESTIONS?:  Please feel free to call your physician or the hospital operator if you have any questions, and they will be happy to assist you.

## 2014-12-26 ENCOUNTER — Ambulatory Visit (INDEPENDENT_AMBULATORY_CARE_PROVIDER_SITE_OTHER): Payer: Medicaid Other | Admitting: Obstetrics and Gynecology

## 2014-12-26 ENCOUNTER — Other Ambulatory Visit: Payer: Self-pay | Admitting: Obstetrics and Gynecology

## 2014-12-26 ENCOUNTER — Encounter (HOSPITAL_COMMUNITY): Payer: Self-pay

## 2014-12-26 VITALS — BP 120/76 | Ht 66.0 in | Wt 283.0 lb

## 2014-12-26 DIAGNOSIS — Z01812 Encounter for preprocedural laboratory examination: Secondary | ICD-10-CM | POA: Diagnosis not present

## 2014-12-26 DIAGNOSIS — N946 Dysmenorrhea, unspecified: Secondary | ICD-10-CM

## 2014-12-26 DIAGNOSIS — N921 Excessive and frequent menstruation with irregular cycle: Secondary | ICD-10-CM

## 2014-12-26 LAB — COMPREHENSIVE METABOLIC PANEL
ALT: 18 U/L (ref 0–35)
AST: 15 U/L (ref 0–37)
Albumin: 4.1 g/dL (ref 3.5–5.2)
Alkaline Phosphatase: 61 U/L (ref 39–117)
Anion gap: 6 (ref 5–15)
BILIRUBIN TOTAL: 0.5 mg/dL (ref 0.3–1.2)
BUN: 6 mg/dL (ref 6–23)
CHLORIDE: 108 meq/L (ref 96–112)
CO2: 24 mmol/L (ref 19–32)
Calcium: 9 mg/dL (ref 8.4–10.5)
Creatinine, Ser: 0.57 mg/dL (ref 0.50–1.10)
GFR calc Af Amer: >90 mL/min (ref >90)
Glucose, Bld: 94 mg/dL (ref 70–99)
Potassium: 3.9 mmol/L (ref 3.5–5.1)
SODIUM: 138 mmol/L (ref 135–145)
Total Protein: 7 g/dL (ref 6.0–8.3)

## 2014-12-26 LAB — CBC
HEMATOCRIT: 36.8 % (ref 36.0–46.0)
Hemoglobin: 12.5 g/dL (ref 12.0–15.0)
MCH: 29.1 pg (ref 26.0–34.0)
MCHC: 34 g/dL (ref 30.0–36.0)
MCV: 85.8 fL (ref 78.0–100.0)
Platelets: 304 10*3/uL (ref 150–400)
RBC: 4.29 MIL/uL (ref 3.87–5.11)
RDW: 13.2 % (ref 11.5–15.5)
WBC: 9.1 10*3/uL (ref 4.0–10.5)

## 2014-12-26 LAB — URINE MICROSCOPIC-ADD ON

## 2014-12-26 LAB — URINALYSIS, ROUTINE W REFLEX MICROSCOPIC
Bilirubin Urine: NEGATIVE
Glucose, UA: NEGATIVE mg/dL
HGB URINE DIPSTICK: NEGATIVE
KETONES UR: NEGATIVE mg/dL
NITRITE: NEGATIVE
PROTEIN: NEGATIVE mg/dL
Specific Gravity, Urine: 1.01 (ref 1.005–1.030)
Urobilinogen, UA: 0.2 mg/dL (ref 0.0–1.0)
pH: 6 (ref 5.0–8.0)

## 2014-12-26 LAB — SURGICAL PCR SCREEN
MRSA, PCR: NEGATIVE
Staphylococcus aureus: POSITIVE — AB

## 2014-12-26 NOTE — Progress Notes (Signed)
Patient ID: Barbara Jennings, female   DOB: 11/11/1981, 33 y.o.   MRN: [id number] Pt here today for pre op visit. Pt already has surgery scheduled and has already had her exam.  Preoperative History and Physical  Barbara Parodyracy M Miah is a 33 y.o. [id number] here for surgical management of heavy and painful menses.   No significant preoperative concerns. We have reviewed the procedure, and alternatives : pt again confirms that she refuses consideration of IUD or Endometrial ablation. She understands the process of supracervical hyst. She declines bilateral salpingectomy after some discussion. She is aware that her weight will make surgery more difficult..   Proposed surgery: laparoscopic supracervical hysterectomy  Past Medical History  Diagnosis Date  . Asthma   . Anxiety   . Panic attack   . Depression   . Migraine   . Abnormal Pap smear   . HSV-2 (herpes simplex virus 2) infection   . Vaginal Pap smear, abnormal   . Unspecified symptom associated with female genital organs 01/27/2014  . 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 Other and unspecified ovarian cyst 02/06/2014  . Hx of migraines 02/09/2014    Has ?aura, will rx POP  . Vulvar abscess 03/09/2014    [facility] was rx'd bactrim ds x 10 days  . Abnormal uterine bleeding (AUB) 10/11/2014  . Dysmenorrhea 10/11/2014  . Thickened endometrium 10/18/2014  . Ovarian cyst 10/18/2014   Past Surgical History  Procedure Laterality Date  . Cesarean section    . Ovarian cyst removal    . Appendectomy    . Cholecystectomy    . Dilation and curettage of uterus     OB History    Gravida Para Term Preterm AB TAB SAB Ectopic Multiple Living   7 2 1 1 5  5   2      Patient denies any cervical dysplasia or STIs. Current Outpatient Prescriptions on File Prior to Visit  Medication Sig Dispense Refill  . [DISCONTINUED] citalopram (CELEXA) 20  MG tablet Take 20 mg by mouth daily.       No current facility-administered medications on file prior to visit.   Allergies  Allergen Reactions  . Coconut Flavor Anaphylaxis   Social History:   reports that she has been smoking Cigarettes.  She has a 6 pack-year smoking history. She has never used smokeless tobacco. She reports that she drinks alcohol. She reports that she does not use illicit drugs.  Family History  Problem Relation Age of Onset  . Ulcers Father   . Cancer Maternal Aunt     breast   . Diabetes Maternal Grandmother   . Diabetes Maternal Grandfather   . Alzheimer's disease Paternal Grandmother   . Diabetes Maternal Aunt   . Cancer Paternal Aunt     cervical    Review of Systems: Noncontributory  PHYSICAL EXAM: Blood pressure 120/76, height 5\' 6"  (1.676 m), weight 128.368 kg (283 lb), last menstrual period 11/22/2014. General appearance - alert, well appearing, and in no distress Chest - clear to auscultation, no wheezes, rales or rhonchi, symmetric air entry Heart - normal rate and regular rhythm Abdomen - soft, nontender, nondistended, no masses or organomegaly Pelvic - examination not indicated Extremities - peripheral pulses normal, no pedal edema, no clubbing or cyanosis  Labs: No results found for this or any previous visit (from the past 336 hour(s)).  Imaging Studies: No results found.  Assessment: Patient Active Problem List   Diagnosis Date Noted  . URI (upper respiratory infection) 12/13/2014  . Thickened endometrium 10/18/2014  . Ovarian cyst 10/18/2014  . Abnormal uterine bleeding (AUB) 10/11/2014  . Dysmenorrhea 10/11/2014  . Knee pain, chronic 09/12/2014  . Acute meniscal tear of knee 07/03/2014  . Vulvar abscess 03/09/2014  . Dysplasia of cervix, unspecified 02/16/2014  . Contraceptive management 02/09/2014  . Hx of migraines 02/09/2014  . Other and unspecified ovarian cyst 02/06/2014  . Abnormal cervical Papanicolaou smear with  positive human papilloma virus (HPV) DNA test 02/02/2014  . Unspecified symptom associated with female genital organs 01/27/2014  . HSV-2 (herpes simplex virus 2) infection 05/26/2013    Plan: Patient will undergo surgical management with laparoscopic supracervical hysterectomy.   The risks of surgery were discussed in detail with the patient including but not limited to: bleeding which may require transfusion or reoperation; infection which may require antibiotics; injury to surrounding organs which may involve bowel, bladder, ureters ; need for additional procedures including laparoscopy or laparotomy; thromboembolic phenomenon, surgical site problems and other postoperative/anesthesia complications. Likelihood of success in alleviating the patient's condition was discussed. Routine postoperative instructions will be reviewed with the patient and her family in detail after surgery.  The patient concurred with the proposed plan, giving informed written consent for the surgery.  Patient has been NPO since last night she will remain NPO for procedure.  Anesthesia and OR aware.  Preoperative prophylactic antibiotics and SCDs ordered on call to the OR.  .Marland Kitchen

## 2014-12-28 ENCOUNTER — Encounter (HOSPITAL_COMMUNITY)
Admission: RE | Admit: 2014-12-28 | Discharge: 2014-12-28 | Disposition: A | Discharge status: Home / Self Care | Payer: Medicaid Other | Source: Ambulatory Visit | Attending: Obstetrics and Gynecology | Admitting: Obstetrics and Gynecology

## 2014-12-28 DIAGNOSIS — Z01812 Encounter for preprocedural laboratory examination: Secondary | ICD-10-CM | POA: Diagnosis not present

## 2014-12-28 LAB — HCG, SERUM, QUALITATIVE: PREG SERUM: NEGATIVE

## 2015-01-02 ENCOUNTER — Inpatient Hospital Stay (HOSPITAL_COMMUNITY)
Admission: RE | Admit: 2015-01-02 | Discharge: 2015-01-04 | DRG: 743 | Disposition: A | Discharge status: Home / Self Care | Payer: Medicaid Other | Source: Ambulatory Visit | Attending: Obstetrics and Gynecology | Admitting: Obstetrics and Gynecology

## 2015-01-02 ENCOUNTER — Ambulatory Visit (HOSPITAL_COMMUNITY): Payer: Medicaid Other | Admitting: Anesthesiology

## 2015-01-02 ENCOUNTER — Encounter (HOSPITAL_COMMUNITY)
Admission: RE | Disposition: A | Discharge status: Home / Self Care | Payer: Self-pay | Source: Ambulatory Visit | Attending: Obstetrics and Gynecology

## 2015-01-02 ENCOUNTER — Encounter (HOSPITAL_COMMUNITY): Payer: Self-pay | Admitting: Certified Registered Nurse Anesthetist

## 2015-01-02 DIAGNOSIS — J45909 Unspecified asthma, uncomplicated: Secondary | ICD-10-CM | POA: Diagnosis present

## 2015-01-02 DIAGNOSIS — N946 Dysmenorrhea, unspecified: Secondary | ICD-10-CM | POA: Diagnosis present

## 2015-01-02 DIAGNOSIS — F410 Panic disorder [episodic paroxysmal anxiety] without agoraphobia: Secondary | ICD-10-CM | POA: Diagnosis present

## 2015-01-02 DIAGNOSIS — Z720 Tobacco use: Secondary | ICD-10-CM

## 2015-01-02 DIAGNOSIS — G43909 Migraine, unspecified, not intractable, without status migrainosus: Secondary | ICD-10-CM | POA: Diagnosis present

## 2015-01-02 DIAGNOSIS — N920 Excessive and frequent menstruation with regular cycle: Secondary | ICD-10-CM | POA: Diagnosis present

## 2015-01-02 DIAGNOSIS — R938 Abnormal findings on diagnostic imaging of other specified body structures: Secondary | ICD-10-CM | POA: Diagnosis present

## 2015-01-02 DIAGNOSIS — Z91018 Allergy to other foods: Secondary | ICD-10-CM

## 2015-01-02 DIAGNOSIS — F329 Major depressive disorder, single episode, unspecified: Secondary | ICD-10-CM | POA: Diagnosis present

## 2015-01-02 DIAGNOSIS — B009 Herpesviral infection, unspecified: Secondary | ICD-10-CM | POA: Diagnosis present

## 2015-01-02 DIAGNOSIS — Z9049 Acquired absence of other specified parts of digestive tract: Secondary | ICD-10-CM | POA: Diagnosis present

## 2015-01-02 DIAGNOSIS — F419 Anxiety disorder, unspecified: Secondary | ICD-10-CM | POA: Diagnosis present

## 2015-01-02 DIAGNOSIS — N8320 Unspecified ovarian cysts: Secondary | ICD-10-CM | POA: Diagnosis present

## 2015-01-02 DIAGNOSIS — Z90711 Acquired absence of uterus with remaining cervical stump: Secondary | ICD-10-CM | POA: Diagnosis present

## 2015-01-02 HISTORY — PX: SUPRACERVICAL ABDOMINAL HYSTERECTOMY: SHX5393

## 2015-01-02 HISTORY — PX: LAPAROSCOPIC SUPRACERVICAL HYSTERECTOMY: SHX5399

## 2015-01-02 LAB — TYPE AND SCREEN
ABO/RH(D): A POS
ANTIBODY SCREEN: NEGATIVE

## 2015-01-02 SURGERY — HYSTERECTOMY, SUPRACERVICAL, LAPAROSCOPIC
Anesthesia: General | Site: Abdomen

## 2015-01-02 MED ORDER — OXYCODONE-ACETAMINOPHEN 5-325 MG PO TABS
1.0000 | ORAL_TABLET | ORAL | Status: DC | PRN
  Administered 2015-01-03: 1 via ORAL
  Administered 2015-01-03: 2 via ORAL
  Administered 2015-01-03 (×2): 1 via ORAL
  Administered 2015-01-04 (×2): 2 via ORAL
  Filled 2015-01-02: qty 2
  Filled 2015-01-02 (×2): qty 1
  Filled 2015-01-02: qty 2
  Filled 2015-01-02: qty 1
  Filled 2015-01-02: qty 2

## 2015-01-02 MED ORDER — HYDROMORPHONE 0.3 MG/ML IV SOLN
INTRAVENOUS | Status: DC
  Administered 2015-01-02: 6.29 mg via INTRAVENOUS
  Administered 2015-01-02: 0.3 mg via INTRAVENOUS
  Filled 2015-01-02 (×2): qty 25

## 2015-01-02 MED ORDER — LIDOCAINE HCL (PF) 1 % IJ SOLN
INTRAMUSCULAR | Status: AC
  Filled 2015-01-02: qty 5

## 2015-01-02 MED ORDER — GLYCOPYRROLATE 0.2 MG/ML IJ SOLN
0.2000 mg | Freq: Once | INTRAMUSCULAR | Status: AC
  Administered 2015-01-02: 0.2 mg via INTRAVENOUS

## 2015-01-02 MED ORDER — ONDANSETRON HCL 4 MG/2ML IJ SOLN
INTRAMUSCULAR | Status: DC | PRN
  Administered 2015-01-02: 4 mg via INTRAVENOUS

## 2015-01-02 MED ORDER — CEFAZOLIN SODIUM-DEXTROSE 2-3 GM-% IV SOLR
INTRAVENOUS | Status: AC
  Filled 2015-01-02: qty 50

## 2015-01-02 MED ORDER — KETOROLAC TROMETHAMINE 30 MG/ML IJ SOLN
30.0000 mg | Freq: Once | INTRAMUSCULAR | Status: AC
  Administered 2015-01-02: 30 mg via INTRAVENOUS

## 2015-01-02 MED ORDER — ONDANSETRON HCL 4 MG/2ML IJ SOLN
4.0000 mg | Freq: Once | INTRAMUSCULAR | Status: AC | PRN
  Administered 2015-01-02: 4 mg via INTRAVENOUS

## 2015-01-02 MED ORDER — SODIUM CHLORIDE 0.9 % IJ SOLN
INTRAMUSCULAR | Status: AC
  Filled 2015-01-02: qty 20

## 2015-01-02 MED ORDER — GLYCOPYRROLATE 0.2 MG/ML IJ SOLN
INTRAMUSCULAR | Status: AC
  Filled 2015-01-02: qty 3

## 2015-01-02 MED ORDER — MIDAZOLAM HCL 2 MG/2ML IJ SOLN
INTRAMUSCULAR | Status: AC
  Filled 2015-01-02: qty 2

## 2015-01-02 MED ORDER — ROCURONIUM BROMIDE 100 MG/10ML IV SOLN
INTRAVENOUS | Status: DC | PRN
  Administered 2015-01-02: 5 mg via INTRAVENOUS
  Administered 2015-01-02: 35 mg via INTRAVENOUS
  Administered 2015-01-02 (×2): 5 mg via INTRAVENOUS

## 2015-01-02 MED ORDER — KETOROLAC TROMETHAMINE 30 MG/ML IJ SOLN
30.0000 mg | Freq: Four times a day (QID) | INTRAMUSCULAR | Status: DC
  Administered 2015-01-02 – 2015-01-04 (×7): 30 mg via INTRAVENOUS
  Filled 2015-01-02 (×7): qty 1

## 2015-01-02 MED ORDER — LACTATED RINGERS IV SOLN
INTRAVENOUS | Status: DC | PRN
  Administered 2015-01-02 (×4): via INTRAVENOUS

## 2015-01-02 MED ORDER — DIPHENHYDRAMINE HCL 12.5 MG/5ML PO ELIX
12.5000 mg | ORAL_SOLUTION | Freq: Four times a day (QID) | ORAL | Status: DC | PRN
  Administered 2015-01-04: 12.5 mg via ORAL
  Filled 2015-01-02: qty 5

## 2015-01-02 MED ORDER — FENTANYL CITRATE 0.05 MG/ML IJ SOLN: 25.0000 ug | INTRAMUSCULAR | Status: DC | PRN

## 2015-01-02 MED ORDER — FENTANYL CITRATE 0.05 MG/ML IJ SOLN
INTRAMUSCULAR | Status: AC
  Filled 2015-01-02: qty 2

## 2015-01-02 MED ORDER — FENTANYL CITRATE 0.05 MG/ML IJ SOLN
INTRAMUSCULAR | Status: DC | PRN
  Administered 2015-01-02 (×5): 50 ug via INTRAVENOUS
  Administered 2015-01-02: 100 ug via INTRAVENOUS

## 2015-01-02 MED ORDER — SODIUM CHLORIDE 0.9 % IJ SOLN: 9.0000 mL | INTRAMUSCULAR | Status: DC | PRN

## 2015-01-02 MED ORDER — PROPOFOL 10 MG/ML IV BOLUS
INTRAVENOUS | Status: DC | PRN
  Administered 2015-01-02: 150 mg via INTRAVENOUS

## 2015-01-02 MED ORDER — FENTANYL CITRATE 0.05 MG/ML IJ SOLN
INTRAMUSCULAR | Status: AC
  Filled 2015-01-02: qty 5

## 2015-01-02 MED ORDER — DEXAMETHASONE SODIUM PHOSPHATE 4 MG/ML IJ SOLN
INTRAMUSCULAR | Status: DC | PRN
  Administered 2015-01-02: 4 mg via INTRAVENOUS

## 2015-01-02 MED ORDER — ONDANSETRON HCL 4 MG/2ML IJ SOLN: 4.0000 mg | Freq: Four times a day (QID) | INTRAMUSCULAR | Status: DC | PRN

## 2015-01-02 MED ORDER — GLYCOPYRROLATE 0.2 MG/ML IJ SOLN
INTRAMUSCULAR | Status: DC | PRN
  Administered 2015-01-02: 0.6 mg via INTRAVENOUS

## 2015-01-02 MED ORDER — SODIUM CHLORIDE 0.9 % IV SOLN
INTRAVENOUS | Status: DC | PRN
  Administered 2015-01-02: 20 mL

## 2015-01-02 MED ORDER — DEXTROSE 5 % IV SOLN
3.0000 g | INTRAVENOUS | Status: DC
  Filled 2015-01-02: qty 3000

## 2015-01-02 MED ORDER — ONDANSETRON HCL 4 MG/2ML IJ SOLN
4.0000 mg | Freq: Once | INTRAMUSCULAR | Status: AC
  Administered 2015-01-02: 4 mg via INTRAVENOUS

## 2015-01-02 MED ORDER — GLYCOPYRROLATE 0.2 MG/ML IJ SOLN
INTRAMUSCULAR | Status: AC
  Filled 2015-01-02: qty 1

## 2015-01-02 MED ORDER — KETOROLAC TROMETHAMINE 30 MG/ML IJ SOLN
30.0000 mg | Freq: Four times a day (QID) | INTRAMUSCULAR | Status: DC
  Filled 2015-01-02: qty 1

## 2015-01-02 MED ORDER — DIPHENHYDRAMINE HCL 50 MG/ML IJ SOLN: 12.5000 mg | Freq: Four times a day (QID) | INTRAMUSCULAR | Status: DC | PRN

## 2015-01-02 MED ORDER — NEOSTIGMINE METHYLSULFATE 10 MG/10ML IV SOLN
INTRAVENOUS | Status: DC | PRN
  Administered 2015-01-02: 3 mg via INTRAVENOUS

## 2015-01-02 MED ORDER — ROCURONIUM BROMIDE 50 MG/5ML IV SOLN
INTRAVENOUS | Status: AC
  Filled 2015-01-02: qty 1

## 2015-01-02 MED ORDER — SUCCINYLCHOLINE CHLORIDE 20 MG/ML IJ SOLN
INTRAMUSCULAR | Status: AC
  Filled 2015-01-02: qty 1

## 2015-01-02 MED ORDER — CEFAZOLIN SODIUM 1-5 GM-% IV SOLN
INTRAVENOUS | Status: AC
  Filled 2015-01-02: qty 50

## 2015-01-02 MED ORDER — ONDANSETRON HCL 4 MG/2ML IJ SOLN
INTRAMUSCULAR | Status: AC
  Filled 2015-01-02: qty 2

## 2015-01-02 MED ORDER — BUPIVACAINE LIPOSOME 1.3 % IJ SUSP
INTRAMUSCULAR | Status: AC
  Filled 2015-01-02: qty 20

## 2015-01-02 MED ORDER — NEOSTIGMINE METHYLSULFATE 10 MG/10ML IV SOLN
INTRAVENOUS | Status: AC
  Filled 2015-01-02: qty 1

## 2015-01-02 MED ORDER — LACTATED RINGERS IV SOLN
INTRAVENOUS | Status: DC
  Administered 2015-01-02: 07:00:00 via INTRAVENOUS

## 2015-01-02 MED ORDER — KETOROLAC TROMETHAMINE 30 MG/ML IJ SOLN
INTRAMUSCULAR | Status: AC
  Filled 2015-01-02: qty 1

## 2015-01-02 MED ORDER — DEXTROSE 5 % IV SOLN
3.0000 g | INTRAVENOUS | Status: DC | PRN
  Administered 2015-01-02: 3 g via INTRAVENOUS

## 2015-01-02 MED ORDER — DOCUSATE SODIUM 100 MG PO CAPS
100.0000 mg | ORAL_CAPSULE | Freq: Two times a day (BID) | ORAL | Status: DC
  Administered 2015-01-03 – 2015-01-04 (×3): 100 mg via ORAL
  Filled 2015-01-02 (×3): qty 1

## 2015-01-02 MED ORDER — IBUPROFEN 600 MG PO TABS: 600.0000 mg | ORAL_TABLET | Freq: Four times a day (QID) | ORAL | Status: DC | PRN

## 2015-01-02 MED ORDER — MIDAZOLAM HCL 2 MG/2ML IJ SOLN
1.0000 mg | INTRAMUSCULAR | Status: DC | PRN
  Administered 2015-01-02: 2 mg via INTRAVENOUS

## 2015-01-02 MED ORDER — ONDANSETRON HCL 4 MG PO TABS: 4.0000 mg | ORAL_TABLET | Freq: Four times a day (QID) | ORAL | Status: DC | PRN

## 2015-01-02 MED ORDER — PROPOFOL 10 MG/ML IV BOLUS
INTRAVENOUS | Status: AC
  Filled 2015-01-02: qty 20

## 2015-01-02 MED ORDER — LIDOCAINE HCL (CARDIAC) 10 MG/ML IV SOLN
INTRAVENOUS | Status: DC | PRN
  Administered 2015-01-02: 50 mg via INTRAVENOUS

## 2015-01-02 MED ORDER — DEXAMETHASONE SODIUM PHOSPHATE 4 MG/ML IJ SOLN
INTRAMUSCULAR | Status: AC
  Filled 2015-01-02: qty 1

## 2015-01-02 MED ORDER — PANTOPRAZOLE SODIUM 40 MG PO TBEC
40.0000 mg | DELAYED_RELEASE_TABLET | Freq: Every day | ORAL | Status: DC
  Administered 2015-01-03 – 2015-01-04 (×2): 40 mg via ORAL
  Filled 2015-01-02 (×2): qty 1

## 2015-01-02 MED ORDER — NALOXONE HCL 0.4 MG/ML IJ SOLN: 0.4000 mg | INTRAMUSCULAR | Status: DC | PRN

## 2015-01-02 MED ORDER — 0.9 % SODIUM CHLORIDE (POUR BTL) OPTIME
TOPICAL | Status: DC | PRN
  Administered 2015-01-02: 1000 mL

## 2015-01-02 MED ORDER — SODIUM CHLORIDE 0.9 % IV SOLN
INTRAVENOUS | Status: DC
  Administered 2015-01-02 (×2): via INTRAVENOUS

## 2015-01-02 MED ORDER — DEXAMETHASONE SODIUM PHOSPHATE 4 MG/ML IJ SOLN
4.0000 mg | Freq: Once | INTRAMUSCULAR | Status: AC
  Administered 2015-01-02: 4 mg via INTRAVENOUS

## 2015-01-02 SURGICAL SUPPLY — 74 items
ADH SKN CLS APL DERMABOND .7 (GAUZE/BANDAGES/DRESSINGS)
APL SKNCLS STERI-STRIP NONHPOA (GAUZE/BANDAGES/DRESSINGS) ×1
APPLIER CLIP UNV 5X34 EPIX (ENDOMECHANICALS) ×3 IMPLANT
APR XCLPCLP 20M/L UNV 34X5 (ENDOMECHANICALS) ×1
BAG HAMPER (MISCELLANEOUS) ×3 IMPLANT
BANDAGE STRIP 1X3 FLEXIBLE (GAUZE/BANDAGES/DRESSINGS) ×12 IMPLANT
BENZOIN TINCTURE PRP APPL 2/3 (GAUZE/BANDAGES/DRESSINGS) ×3 IMPLANT
BLADE SURG SZ10 CARB STEEL (BLADE) ×6 IMPLANT
BLADE SURG SZ11 CARB STEEL (BLADE) ×3 IMPLANT
CELLS DAT CNTRL 66122 CELL SVR (MISCELLANEOUS) ×1 IMPLANT
CLOSURE WOUND 1/4 X3 (GAUZE/BANDAGES/DRESSINGS) ×1
CLOTH BEACON ORANGE TIMEOUT ST (SAFETY) ×3 IMPLANT
COVER LIGHT HANDLE STERIS (MISCELLANEOUS) ×6 IMPLANT
DECANTER SPIKE VIAL GLASS SM (MISCELLANEOUS) IMPLANT
DERMABOND ADVANCED (GAUZE/BANDAGES/DRESSINGS)
DERMABOND ADVANCED .7 DNX12 (GAUZE/BANDAGES/DRESSINGS) IMPLANT
DISSECTOR BLUNT TIP ENDO 5MM (MISCELLANEOUS) IMPLANT
DRAPE PROXIMA HALF (DRAPES) ×3 IMPLANT
DRESSING COVERLET 3X1 FLEXIBLE (GAUZE/BANDAGES/DRESSINGS) ×9 IMPLANT
DRSG OPSITE POSTOP 4X10 (GAUZE/BANDAGES/DRESSINGS) ×3 IMPLANT
DURAPREP 26ML APPLICATOR (WOUND CARE) ×3 IMPLANT
ELECT BLADE 6 FLAT ULTRCLN (ELECTRODE) ×3 IMPLANT
ELECT REM PT RETURN 9FT ADLT (ELECTROSURGICAL) ×3
ELECTRODE REM PT RTRN 9FT ADLT (ELECTROSURGICAL) ×1 IMPLANT
FILTER SMOKE EVAC LAPAROSHD (FILTER) ×3 IMPLANT
FORMALIN 10 PREFIL 480ML (MISCELLANEOUS) ×3 IMPLANT
GAUZE SPONGE 4X4 16PLY XRAY LF (GAUZE/BANDAGES/DRESSINGS) ×3 IMPLANT
GLOVE BIOGEL PI IND STRL 9 (GLOVE) ×2 IMPLANT
GLOVE BIOGEL PI INDICATOR 9 (GLOVE) ×4
GLOVE ECLIPSE 6.5 STRL STRAW (GLOVE) ×9 IMPLANT
GLOVE ECLIPSE 9.0 STRL (GLOVE) ×6 IMPLANT
GLOVE EXAM NITRILE PF MED BLUE (GLOVE) ×3 IMPLANT
GLOVE INDICATOR 7.0 STRL GRN (GLOVE) ×6 IMPLANT
GOWN SPEC L3 XXLG W/TWL (GOWN DISPOSABLE) ×6 IMPLANT
GOWN STRL REUS W/TWL LRG LVL3 (GOWN DISPOSABLE) ×6 IMPLANT
INST SET LAPROSCOPIC GYN AP (KITS) ×3 IMPLANT
IV NS IRRIG 3000ML ARTHROMATIC (IV SOLUTION) ×3 IMPLANT
KIT ROOM TURNOVER AP CYSTO (KITS) ×3 IMPLANT
MANIFOLD NEPTUNE II (INSTRUMENTS) ×3 IMPLANT
NEEDLE HYPO 21X1 ECLIPSE (NEEDLE) ×3 IMPLANT
NEEDLE HYPO 25X1 1.5 SAFETY (NEEDLE) IMPLANT
NEEDLE INSUFFLATION 14GA 120MM (NEEDLE) ×3 IMPLANT
NS IRRIG 1000ML POUR BTL (IV SOLUTION) ×3 IMPLANT
PACK PERI GYN (CUSTOM PROCEDURE TRAY) ×3 IMPLANT
PAD ARMBOARD 7.5X6 YLW CONV (MISCELLANEOUS) ×3 IMPLANT
PENCIL HANDSWITCHING (ELECTRODE) ×3 IMPLANT
RTRCTR WOUND ALEXIS 18CM MED (MISCELLANEOUS) ×3
SCALPEL HARMONIC ACE (MISCELLANEOUS) ×3 IMPLANT
SET BASIN LINEN APH (SET/KITS/TRAYS/PACK) ×3 IMPLANT
SET TUBE IRRIG SUCTION NO TIP (IRRIGATION / IRRIGATOR) ×3 IMPLANT
SLEEVE ENDOPATH XCEL 5M (ENDOMECHANICALS) ×3 IMPLANT
SOLUTION ANTI FOG 6CC (MISCELLANEOUS) ×3 IMPLANT
SPONGE LAP 18X18 X RAY DECT (DISPOSABLE) ×6 IMPLANT
STRIP CLOSURE SKIN 1/4X3 (GAUZE/BANDAGES/DRESSINGS) ×2 IMPLANT
SUT CHROMIC 0 CT 1 (SUTURE) ×30 IMPLANT
SUT CHROMIC 2 0 CT 1 (SUTURE) ×3 IMPLANT
SUT PLAIN CT 1/2CIR 2-0 27IN (SUTURE) ×6 IMPLANT
SUT VIC AB 0 CT1 27 (SUTURE) ×9
SUT VIC AB 0 CT1 27XCR 8 STRN (SUTURE) ×3 IMPLANT
SUT VIC AB 2-0 CT2 27 (SUTURE) ×3 IMPLANT
SUT VIC AB 4-0 PS2 27 (SUTURE) ×6 IMPLANT
SUT VICRYL 0 UR6 27IN ABS (SUTURE) ×3 IMPLANT
SUT VICRYL 4 0 KS 27 (SUTURE) ×3 IMPLANT
SYR 20CC LL (SYRINGE) ×3 IMPLANT
SYR BULB IRRIGATION 50ML (SYRINGE) ×3 IMPLANT
SYRINGE 10CC LL (SYRINGE) ×3 IMPLANT
TOWEL BLUE STERILE X RAY DET (MISCELLANEOUS) ×3 IMPLANT
TRAY FOLEY CATH 16FR SILVER (SET/KITS/TRAYS/PACK) ×3 IMPLANT
TROCAR ENDO BLADELESS 11MM (ENDOMECHANICALS) ×3 IMPLANT
TROCAR XCEL NON-BLD 5MMX100MML (ENDOMECHANICALS) ×3 IMPLANT
TROCAR XCEL UNIV SLVE 11M 100M (ENDOMECHANICALS) IMPLANT
TUBING INSUF HEATED (TUBING) ×3 IMPLANT
WARMER LAPAROSCOPE (MISCELLANEOUS) ×3 IMPLANT
YANKAUER SUCT BULB TIP 10FT TU (MISCELLANEOUS) ×3 IMPLANT

## 2015-01-02 NOTE — Anesthesia Postprocedure Evaluation (Signed)
  Anesthesia Post-op Note  Patient: Barbara Jennings  Procedure(s) Performed: Procedure(s): ATTEMPTED LAPAROSCOPIC SUPRACERVICAL HYSTERECTOMY CONVERTED TO OPEN AT [id number] (N/A) HYSTERECTOMY SUPRACERVICAL ABDOMINAL (N/A)  Patient Location: PACU  Anesthesia Type:General  Level of Consciousness: awake, alert , patient cooperative and responds to stimulation  Airway and Oxygen Therapy: Patient Spontanous Breathing and Patient connected to face mask oxygen  Post-op Pain: none  Post-op Assessment: Post-op Vital signs reviewed, Patient's Cardiovascular Status Stable, Respiratory Function Stable, Patent Airway, No signs of Nausea or vomiting and Pain level controlled  Post-op Vital Signs: Reviewed and stable  Last Vitals:  Filed Vitals:   01/02/15 0720  BP: 134/114  Pulse:   Temp:   Resp: 18    Complications: No apparent anesthesia complications

## 2015-01-02 NOTE — Op Note (Signed)
01/02/2015  10:28 AM  PATIENT:  Barbara Jennings  34 y.o. female  PRE-OPERATIVE DIAGNOSIS:  MENORRHAGIA  POST-OPERATIVE DIAGNOSIS:  MENORRHAGIA  PROCEDURE:  Procedure(s): ATTEMPTED LAPAROSCOPIC SUPRACERVICAL HYSTERECTOMY CONVERTED TO OPEN AT 0850 (N/A) HYSTERECTOMY SUPRACERVICAL ABDOMINAL (N/A)  SURGEON:  Surgeon(s) and Role:    * Tilda BurrowJohn Ledarius Leeson V, MD - Primary  PHYSICIAN ASSISTANT:   ASSISTANTS: Ashley RNFA   ANESTHESIA:   local and general  EBL:  Total I/O In: 3500 [I.V.:3500] Out: 600 [Urine:150; Blood:450]  BLOOD ADMINISTERED:none  DRAINS: Urinary Catheter (Foley)   LOCAL MEDICATIONS USED:  OTHER Exparel 20 cc  SPECIMEN:  Source of Specimen:  Uterus and lower uterine segment  DISPOSITION OF SPECIMEN:  PATHOLOGY  COUNTS:  YES  TOURNIQUET:  * No tourniquets in log *  DICTATION: .Dragon Dictation  PLAN OF CARE: Admit to inpatient   PATIENT DISPOSITION:  PACU - hemodynamically stable.   Delay start of Pharmacological VTE agent (>24hrs) due to surgical blood loss or risk of bleeding: not applicable  Details of procedure:. Patient was taken operating room prepped and draped for combined abdominal and vaginal procedure with Foley catheter in place, Hulka tenaculum attached to the cervix for uterine manipulation along with single-tooth tenaculum attached the cervix, legs in low lithotomy standard support with abdomen prepped and draped. Timeout was conducted. Ancef administered 3 g. Attention was directed to the abdomen where an infraumbilical vertical once similar skin incision was made as well as a transverse centimeter at the site of the old cesarean scar. Dissection down the fascia was performed and then the fascia opened for a distance of about 3/2 cm in preparation for removing the uterus through the suprapubic site. Veress needle was introduced through the umbilicus with water droplet technique confirmed intraperineal location and pneumoperitoneum achieved under 12  mmHg maximum pressure, and then laparoscopic trocar introduced through the umbilicus without difficulty. There was no suspicion of bleeding or injury to internal organs. Inspection suprapubic area showed small omental adhesions to the old cesarean scar. Right lower quadrant and left lower quadrant 5 mm trochars were inserted, and harmonic scalpel used to transect the omental adhesions the pubic trocar was then inserted through the midline without difficulty and attention the pelvis was performed. The patient was placed in Trendelenburg position, and the bowel was able to be elevated away oh away from the uterus. The visualization was still less than optimal particularly on the left side due to the sigmoid colon pelvis. For started on the right side where the right round ligament right fallopian tube and right utero-ovarian ligament were coagulated and transected with harmonic scalpel. Bladder flap was developed anteriorly being careful to avoid any deep dissection, and the bladder flap was taken off the right one half of the incision. 2 hemoclips were placed across the uterine vessels on the right side and coagulation performed above where the transection was planned. The uterine vessels on the side were not disrupted but simply coagulated and clipped  The left side was then addressed. The long left utero-ovarian ligament, fallopian tube and left round ligament were Coagulated and hemostatic. Bladder flap was taken down on this side. There was some thicker fibrotic adhesions on this side. Uterine vessels could be visualized on the left side., With hemoclips 3 placed across the uterine vessel harmonic scalpel was then used above this to coagulate the uterine vessels and then they were transected with the Harmonic scalpel. Traction. To maintain adequate visualization and with combination of traction countertraction we developed backbleeding  obscured the visual field. Her made to coagulate this with the Harmonic  scalpel but were unsuccessful. Suctioning was performed but the visualization was inadequate to continue laparoscopically. Decision was made to convert to an open procedure. Was then prepared with deflation the abdomen, removal laparoscopic trochars and instruments, lysing the patient supine and the and then opening the suprapubic incision greater transverse distance of about 10 cm the fascia opened as well. Peritoneum was opened in the midline. Pelvis was suctioned out, and Alexis wound retractor positioned but gave inadequate visualization. The converted to a Balfour retractor, and were able to pack the bowel away with some for, and the pelvis was suctioned out further and this can be placed on the upper traction. The left uterine vessels were inspected and were relatively hemostatic the bleeding was primarily backbleeding from the left lateral side of the uterus which was then treated with the achieve adequate hemostasis in the uterine vessels on the left side with 2 curved hands, then doubly ligated with 0 chromic town 1 and then 0 Vicryl. Converted to 0 Vicryl from here on out. A small portion of the upper cardinal ligament was then clamped cut and taken down. The bladder flap had been well mobilized during the laparoscopic portion of the case. Right uterine vessels were then clamped with curved Heaney clamps transected and doubly ligated with 0 Vicryl the uterine body and in difficult to fully visualize the lower uterine segment so malleable retractor in place to protect the bowel, the uterus is amputated off of the lower uterine segment. This allowed improved access, and the remaining 2 cm of low uterine segment were then taken off using cautery, Mayo scissors transectio Thomas and then the cuff of the cervix was oversewn with interrupted sutures of 0 Vicryl. Pedicles were inspected confirmed as hemostatic. Pelvis was irrigated. The pedicles to the utero-ovarian ligaments and fallopian tube were then  inspected and grasped with Kelly clamp and oversewn for precaution increased manipulation during the laparotomy portion of the case. The hemostasis was confirmed. We were well away from the retroperitoneum and its retroperitoneal structures. Urine output was steady throughout the case . She remained hemodynamically stable. \ Pelvis was irrigated again, laparotomy equipment removed, anterior peritoneum closed with 2-0 Vicryl, fascia closed with 0 Vicryl, Exparel injected into the fascia, she is closed with 20 plain, then subcuticular closure of all 4 incision sites formed and more Exparel place her in the suprapubic site. Fascial umbilical port was also closed the deep fascial level using [employer] retractors and Allis clamps to grasp the fascial edges  Sponge and needle counts were correct throughout patient to recovery room in stable condition, urine output 1 50 cc blood loss 450 cc

## 2015-01-02 NOTE — Anesthesia Preprocedure Evaluation (Signed)
Anesthesia Evaluation  Patient identified by MRN, date of birth, ID band Patient awake    Reviewed: Allergy & Precautions, NPO status , Patient's Chart, lab work & pertinent test results  Airway Mallampati: I  TM Distance: >3 FB     Dental  (+) Teeth Intact   Pulmonary asthma , Current Smoker,  breath sounds clear to auscultation        Cardiovascular negative cardio ROS  Rhythm:Regular Rate:Normal     Neuro/Psych  Headaches, PSYCHIATRIC DISORDERS Anxiety Depression    GI/Hepatic negative GI ROS,   Endo/Other  Morbid obesity  Renal/GU      Musculoskeletal   Abdominal   Peds  Hematology   Anesthesia Other Findings   Reproductive/Obstetrics                             Anesthesia Physical Anesthesia Plan  ASA: II  Anesthesia Plan: General   Post-op Pain Management:    Induction: Intravenous  Airway Management Planned: Oral ETT  Additional Equipment:   Intra-op Plan:   Post-operative Plan: Extubation in OR  Informed Consent: I have reviewed the patients History and Physical, chart, labs and discussed the procedure including the risks, benefits and alternatives for the proposed anesthesia with the patient or authorized representative who has indicated his/her understanding and acceptance.     Plan Discussed with:   Anesthesia Plan Comments:         Anesthesia Quick Evaluation  

## 2015-01-02 NOTE — Progress Notes (Signed)
Patient ambulated length of hallway, approximately 200 feet with supervision.  Patient tolerated ambulation well, no signs of distress noted.  Patient stated she felt "okay" while she walked.

## 2015-01-02 NOTE — Brief Op Note (Signed)
01/02/2015  10:28 AM  PATIENT:  Barbara Jennings  34 y.o. female  PRE-OPERATIVE DIAGNOSIS:  MENORRHAGIA  POST-OPERATIVE DIAGNOSIS:  MENORRHAGIA  PROCEDURE:  Procedure(s): ATTEMPTED LAPAROSCOPIC SUPRACERVICAL HYSTERECTOMY CONVERTED TO OPEN AT 0850 (N/A) HYSTERECTOMY SUPRACERVICAL ABDOMINAL (N/A)  SURGEON:  Surgeon(s) and Role:    * Tilda BurrowJohn Lacy Taglieri V, MD - Primary  PHYSICIAN ASSISTANT:   ASSISTANTS: Ashley RNFA   ANESTHESIA:   local and general  EBL:  Total I/O In: 3500 [I.V.:3500] Out: 600 [Urine:150; Blood:450]  BLOOD ADMINISTERED:none  DRAINS: Urinary Catheter (Foley)   LOCAL MEDICATIONS USED:  OTHER Exparel 20 cc  SPECIMEN:  Source of Specimen:  Uterus and lower uterine segment  DISPOSITION OF SPECIMEN:  PATHOLOGY  COUNTS:  YES  TOURNIQUET:  * No tourniquets in log *  DICTATION: .Dragon Dictation  PLAN OF CARE: Admit to inpatient   PATIENT DISPOSITION:  PACU - hemodynamically stable.   Delay start of Pharmacological VTE agent (>24hrs) due to surgical blood loss or risk of bleeding: not applicable

## 2015-01-02 NOTE — Care Management Utilization Note (Signed)
UR complete 

## 2015-01-02 NOTE — Transfer of Care (Signed)
Immediate Anesthesia Transfer of Care Note  Patient: Barbara Jennings  Procedure(s) Performed: Procedure(s): ATTEMPTED LAPAROSCOPIC SUPRACERVICAL HYSTERECTOMY CONVERTED TO OPEN AT 0850 (N/A) HYSTERECTOMY SUPRACERVICAL ABDOMINAL (N/A)  Patient Location: PACU  Anesthesia Type:General  Level of Consciousness: awake, alert , oriented, patient cooperative and responds to stimulation  Airway & Oxygen Therapy: Patient Spontanous Breathing and Patient connected to face mask oxygen  Post-op Assessment: Report given to PACU RN, Post -op Vital signs reviewed and stable and Patient moving all extremities X 4  Post vital signs: Reviewed and stable  Complications: No apparent anesthesia complications

## 2015-01-02 NOTE — Anesthesia Procedure Notes (Signed)
Procedure Name: Intubation Date/Time: 01/02/2015 7:41 AM Performed by: Patrcia DollyMOSES, Prentiss Hammett Pre-anesthesia Checklist: Patient identified, Patient being monitored, Timeout performed, Emergency Drugs available and Suction available Patient Re-evaluated:Patient Re-evaluated prior to inductionOxygen Delivery Method: Circle System Utilized Preoxygenation: Pre-oxygenation with 100% oxygen Intubation Type: IV induction Ventilation: Mask ventilation without difficulty Laryngoscope Size: Miller and 2 Grade View: Grade I Tube type: Oral Tube size: 7.0 mm Number of attempts: 1 Airway Equipment and Method: stylet Placement Confirmation: ETT inserted through vocal cords under direct vision,  positive ETCO2 and breath sounds checked- equal and bilateral Secured at: 21 cm Tube secured with: Tape Dental Injury: Teeth and Oropharynx as per pre-operative assessment

## 2015-01-02 NOTE — H&P (Signed)
Expand All Collapse All   Patient ID: Barbara Jennings, female DOB: 04/09/1981, 34 y.o. MRN: [id number] Pt here today for pre op visit. Pt already has surgery scheduled and has already had her exam.  Preoperative History and Physical  Barbara Parodyracy M Berberich is a 34 y.o. [id number] here for surgical management of heavy and painful menses. No significant preoperative concerns. We have reviewed the procedure, and alternatives : pt again confirms that she refuses consideration of IUD or Endometrial ablation. She understands the process of supracervical hyst. She declines bilateral salpingectomy after some discussion. She is aware that her weight will make surgery more difficult, and that a minilaparotomy, a widening of the trochar site at the old cesarean scar, will be done to extract the uterine specimen. The patient's last pap showed ASCUS with +HPV, and was evaluated with normal tissue on Colposcopy. The endometrium is benign on preOp endometrial biopsy. Marland Kitchen.Ultrasound shows a small uterus, 7.9 cm length x 5 cm width x 4.9 cm thickness. We have discussed the pro's and cons of fallopian tube removal for possible cancer risk-reduction, and patient declines to have this done unless specific abnormalities are seen at the time of surgery.   Proposed surgery: laparoscopic supracervical hysterectomy  Past Medical History  Diagnosis Date  . Asthma   . Anxiety   . Panic attack   . Depression   . Migraine   . Abnormal Pap smear   . HSV-2 (herpes simplex virus 2) infection   . Vaginal Pap smear, abnormal   . Unspecified symptom associated with female genital organs 01/27/2014  . 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 Other and unspecified ovarian cyst 02/06/2014  . Hx of migraines 02/09/2014    Has ?aura, will rx POP  . Vulvar abscess 03/09/2014    [facility] bactrim ds x 10 days  . Abnormal uterine bleeding (AUB) 10/11/2014  . Dysmenorrhea 10/11/2014  . Thickened endometrium 10/18/2014  . Ovarian cyst 10/18/2014   Past Surgical History  Procedure Laterality Date  . Cesarean section    . Ovarian cyst removal    . Appendectomy    . Cholecystectomy    . Dilation and curettage of uterus     OB History    Gravida Para Term Preterm AB TAB SAB Ectopic Multiple Living   7 2 1 1 5  5   2      Patient denies any cervical dysplasia or STIs. Current Outpatient Prescriptions on File Prior to Visit  Medication Sig Dispense Refill  . [DISCONTINUED] citalopram (CELEXA) 20 MG tablet Take 20 mg by mouth daily.      No current facility-administered medications on file prior to visit.   Allergies  Allergen Reactions  . Coconut Flavor Anaphylaxis   Social History:  reports that she has been smoking Cigarettes. She has a 6 pack-year smoking history. She has never used smokeless tobacco. She reports that she drinks alcohol. She reports that she does not use illicit drugs.  Family History  Problem Relation Age of Onset  . Ulcers Father   . Cancer Maternal Aunt     breast   . Diabetes Maternal Grandmother   . Diabetes Maternal Grandfather   . Alzheimer's disease Paternal Grandmother   . Diabetes Maternal Aunt   . Cancer Paternal Aunt     cervical    Review of Systems: Noncontributory  PHYSICAL EXAM: Blood pressure 120/76, height  (1.676 m), weight 128.368 kg (283 lb), last menstrual period 11/22/2014. General appearance - alert, well appearing, and in no distress Chest - clear to auscultation, no wheezes, rales or rhonchi, symmetric air entry Heart - normal rate and regular rhythm Abdomen - soft, nontender, nondistended, no masses or organomegaly Pelvic - examination not indicated Extremities -  peripheral pulses normal, no pedal edema, no clubbing or cyanosis  Labs: No results found for this or any previous visit (from the past 336 hour(s)).  Imaging Studies:  Imaging Results    No results found.    Assessment: Patient Active Problem List   Diagnosis Date Noted  . URI (upper respiratory infection) 12/13/2014  . Thickened endometrium 10/18/2014  . Ovarian cyst 10/18/2014  . Abnormal uterine bleeding (AUB) 10/11/2014  . Dysmenorrhea 10/11/2014  . Knee pain, chronic 09/12/2014  . Acute meniscal tear of knee 07/03/2014  . Vulvar abscess 03/09/2014  . Dysplasia of cervix, unspecified 02/16/2014  . Contraceptive management 02/09/2014  . Hx of migraines 02/09/2014  . Other and unspecified ovarian cyst 02/06/2014  . 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 Unspecified symptom associated with female genital organs 01/27/2014  . HSV-2 (herpes simplex virus 2) infection 05/26/2013    Plan: Patient will undergo surgical management with laparoscopic supracervical hysterectomy. The risks of surgery were discussed in detail with the patient including but not limited to: bleeding which may require transfusion or reoperation; infection which may require antibiotics; injury to surrounding organs which may involve bowel, bladder, ureters ; need for additional procedures including laparoscopy or laparotomy; thromboembolic phenomenon, surgical site problems and other postoperative/anesthesia complications. Likelihood of success in alleviating the patient's condition was discussed. Routine postoperative instructions will be reviewed with the patient and her family in detail after surgery. The patient concurred with the proposed plan, giving informed written consent for the surgery. Patient has been NPO since last night she will remain NPO for procedure. Anesthesia and OR aware.  Preoperative prophylactic antibiotics and SCDs ordered on call to the OR. Marland Kitchen    She has taken Magnesium Citrate yesterday to evacuate the bowel partially, with satisfactory results. She drank liquids after the bowel evacuation.  She is aware that a small minilaparotomy will be performed to remove the uterine body, at the site of her prior cesarean. She is aware and agrees that if procedure is excessively technically challenging, that it will be converted to laparotomy, and this is again confirmed the morning of surgery.

## 2015-01-02 NOTE — Progress Notes (Signed)
Day of Surgery Procedure(s) (LRB): ATTEMPTED LAPAROSCOPIC SUPRACERVICAL HYSTERECTOMY CONVERTED TO OPEN AT [id number] (N/A) HYSTERECTOMY SUPRACERVICAL ABDOMINAL (N/A)  Subjective: Patient reports incisional pain and tolerating PO.  Ambulated, wants foley out  Objective: I have reviewed patient's vital signs, intake and output and medications.  General: alert, cooperative and no distress Resp: unlabored respiration  Assessment: s/p Procedure(s): ATTEMPTED LAPAROSCOPIC SUPRACERVICAL HYSTERECTOMY CONVERTED TO OPEN AT 0850 (N/A) HYSTERECTOMY SUPRACERVICAL ABDOMINAL (N/A): stable and progressing well  Plan: d/c foley  LOS: 0 days    Barbara Jennings 01/02/2015, 6:48 PM

## 2015-01-03 LAB — BASIC METABOLIC PANEL
ANION GAP: 7 (ref 5–15)
BUN: 6 mg/dL (ref 6–23)
CHLORIDE: 109 meq/L (ref 96–112)
CO2: 23 mmol/L (ref 19–32)
Calcium: 8.4 mg/dL (ref 8.4–10.5)
Creatinine, Ser: 0.63 mg/dL (ref 0.50–1.10)
Glucose, Bld: 115 mg/dL — ABNORMAL HIGH (ref 70–99)
Potassium: 3.7 mmol/L (ref 3.5–5.1)
Sodium: 139 mmol/L (ref 135–145)

## 2015-01-03 LAB — CBC
HCT: 29.8 % — ABNORMAL LOW (ref 36.0–46.0)
Hemoglobin: 10.1 g/dL — ABNORMAL LOW (ref 12.0–15.0)
MCH: 29.7 pg (ref 26.0–34.0)
MCHC: 33.9 g/dL (ref 30.0–36.0)
MCV: 87.6 fL (ref 78.0–100.0)
PLATELETS: 293 10*3/uL (ref 150–400)
RBC: 3.4 MIL/uL — ABNORMAL LOW (ref 3.87–5.11)
RDW: 13.4 % (ref 11.5–15.5)
WBC: 10.5 10*3/uL (ref 4.0–10.5)

## 2015-01-03 MED ORDER — HYDROMORPHONE HCL 1 MG/ML IJ SOLN
1.0000 mg | INTRAMUSCULAR | Status: DC | PRN
  Administered 2015-01-03 (×4): 1 mg via INTRAVENOUS
  Administered 2015-01-04: 2 mg via INTRAVENOUS
  Filled 2015-01-03 (×4): qty 1
  Filled 2015-01-03: qty 2

## 2015-01-03 NOTE — Care Management Note (Signed)
    Page 1 of 1   01/03/2015     11:09:18 AM CARE MANAGEMENT NOTE 01/03/2015  Patient:  Barbara Jennings,Barbara Jennings   Account Number:  [id number]  Date Initiated:  01/03/2015  Documentation initiated by:  Kathyrn SheriffHILDRESS,JESSICA  Subjective/Objective Assessment:   Pt is from home with self care. Pt has no HH services, DME's or med needs prior to admission. Pt plans to dsicharge home with self care. No CM needs.     Action/Plan:   Anticipated DC Date:  01/04/2015   Anticipated DC Plan:  HOME/SELF CARE      DC Planning Services  CM consult      Choice offered to / List presented to:             Status of service:  Completed, signed off Medicare Important Message given?   (If response is "NO", the following Medicare IM given date fields will be blank) Date Medicare IM given:   Medicare IM given by:   Date Additional Medicare IM given:   Additional Medicare IM given by:    Discharge Disposition:  HOME/SELF CARE  Per UR Regulation:    If discussed at Long Length of Stay Meetings, dates discussed:    Comments:  01/03/2015 1100 Kathyrn SheriffJessica Childress, RN, MSN, [facility]

## 2015-01-03 NOTE — Addendum Note (Signed)
Addendum  created 01/03/15 1429 by Despina Hiddenobert J Martrell Eguia, CRNA   Modules edited: Notes Section   Notes Section:  File: [id number]

## 2015-01-03 NOTE — Progress Notes (Signed)
Patient has voided several times during night, but has missed hat placed in toilet which  Is why accurate output has not been recorded.

## 2015-01-03 NOTE — Progress Notes (Signed)
1 Day Post-Op Procedure(s) (LRB): ATTEMPTED LAPAROSCOPIC SUPRACERVICAL HYSTERECTOMY CONVERTED TO OPEN AT [id number] (N/A) HYSTERECTOMY SUPRACERVICAL ABDOMINAL (N/A)  Subjective: Patient reports incisional pain, tolerating PO and no problems voiding.    Objective: I have reviewed patient's vital signs, intake and output and labs.  General: alert, cooperative and mild distress GI: normal findings: soft, non-tender and incision: clean, dry and intact Vaginal Bleeding: none  CBC CBC Latest Ref Rng 01/03/2015 12/26/2014 10/11/2014  WBC 4.0 - 10.5 K/uL 10.5 9.1 9.2  Hemoglobin 12.0 - 15.0 g/dL 10.1(L) 12.5 13.5  Hematocrit 36.0 - 46.0 % 29.8(L) 36.8 39.7  Platelets 150 - 400 K/uL 293 304 317       Assessment: s/p Procedure(s): ATTEMPTED LAPAROSCOPIC SUPRACERVICAL HYSTERECTOMY CONVERTED TO OPEN AT 0850 (N/A) HYSTERECTOMY SUPRACERVICAL ABDOMINAL (N/A): stable and tolerating diet  Plan: Advance diet Encourage ambulation Advance to PO medication Discontinue IV fluids probable d/c Thursday  LOS: 1 day    Tessie Ordaz V 01/03/2015, 8:25 AM

## 2015-01-03 NOTE — Anesthesia Postprocedure Evaluation (Signed)
  Anesthesia Post-op Note  Patient: Barbara Jennings  Procedure(s) Performed: Procedure(s): ATTEMPTED LAPAROSCOPIC SUPRACERVICAL HYSTERECTOMY CONVERTED TO OPEN AT [id number] (N/A) HYSTERECTOMY SUPRACERVICAL ABDOMINAL (N/A)  Patient Location: room 307  Anesthesia Type:General  Level of Consciousness: awake, alert , oriented and patient cooperative  Airway and Oxygen Therapy: Patient Spontanous Breathing  Post-op Pain: 7 /10, moderate  Post-op Assessment: Post-op Vital signs reviewed, Patient's Cardiovascular Status Stable, Respiratory Function Stable, Patent Airway, No signs of Nausea or vomiting and Adequate PO intake  Post-op Vital Signs: Reviewed and stable  Last Vitals:  Filed Vitals:   01/03/15 1425  BP: 107/42  Pulse: 59  Temp: 36.8 C  Resp:     Complications: No apparent anesthesia complications

## 2015-01-04 ENCOUNTER — Encounter (HOSPITAL_COMMUNITY): Payer: Self-pay | Admitting: Obstetrics and Gynecology

## 2015-01-04 MED ORDER — KETOROLAC TROMETHAMINE 10 MG PO TABS: 10.0000 mg | ORAL_TABLET | Freq: Four times a day (QID) | ORAL | Status: DC | PRN

## 2015-01-04 MED ORDER — OXYCODONE-ACETAMINOPHEN 5-325 MG PO TABS: 1.0000 | ORAL_TABLET | ORAL | Status: DC | PRN

## 2015-01-04 NOTE — Discharge Instructions (Signed)
Abdominal Hysterectomy, Care After Refer to this sheet in the next few weeks. These instructions provide you with information on caring for yourself after your procedure. Your health care provider may also give you more specific instructions. Your treatment has been planned according to current medical practices, but problems sometimes occur. Call your health care provider if you have any problems or questions after your procedure.  WHAT TO EXPECT AFTER THE PROCEDURE After your procedure, it is typical to have the following:  Pain.  Feeling tired.  Poor appetite.  Less interest in sex. HOME CARE INSTRUCTIONS  It takes 4-6 weeks to recover from this surgery. Make sure you follow all your health care provider's instructions. Home care instructions may include:  Take pain medicines only as directed by your health care provider. Do not take over-the-counter pain medicines without checking with your health care provider first.  Change your bandage as directed by your health care provider.  Return to your health care provider to have your sutures taken out.  Take showers instead of baths for 2-3 weeks. Ask your health care provider when it is safe to start showering.  Do not douche, use tampons, or have sexual intercourse for at least 6 weeks or until your health care provider says you can.   Follow your health care provider's advice about exercise, lifting, driving, and general activities.  Get plenty of rest and sleep.   Do not lift anything heavier than a gallon of milk (about 10 lb [4.5 kg]) for the first month after surgery.  You can resume your normal diet if your health care provider says it is okay.   Do not drink alcohol until your health care provider says you can.   If you are constipated, ask your health care provider if you can take a mild laxative.  Eating foods high in fiber may also help with constipation. Eat plenty of raw fruits and vegetables, whole grains, and  beans.  Drink enough fluids to keep your urine clear or pale yellow.   Try to have someone at home with you for the first 1-2 weeks to help around the house.  Keep all follow-up appointments. SEEK MEDICAL CARE IF:   You have chills or fever.  You have swelling, redness, or pain in the area of your incision that is getting worse.   You have pus coming from the incision.   You notice a bad smell coming from the incision or bandage.   Your incision breaks open.   You feel dizzy or light-headed.   You have pain or bleeding when you urinate.   You have persistent diarrhea.   You have persistent nausea and vomiting.   You have abnormal vaginal discharge.   You have a rash.   You have any type of abnormal reaction or develop an allergy to your medicine.   Your pain medicine is not helping.  SEEK IMMEDIATE MEDICAL CARE IF:   You have a fever and your symptoms suddenly get worse.  You have severe abdominal pain.  You have chest pain.  You have shortness of breath.  You faint.  You have pain, swelling, or redness of your leg.  You have heavy vaginal bleeding with blood clots. MAKE SURE YOU:  Understand these instructions.  Will watch your condition.  Will get help right away if you are not doing well or get worse. Document Released: 07/04/2005 Document Revised: 12/20/2013 Document Reviewed: 10/07/2013 ExitCare Patient Information 2015 [employer]. This information is not intended   to replace advice given to you by your health care provider. Make sure you discuss any questions you have with your health care provider.  

## 2015-01-04 NOTE — Progress Notes (Signed)
2 Days Post-Op Procedure(s) (LRB): ATTEMPTED LAPAROSCOPIC SUPRACERVICAL HYSTERECTOMY CONVERTED TO OPEN AT [id number] (N/A) HYSTERECTOMY SUPRACERVICAL ABDOMINAL (N/A)  Subjective: Patient reports incisional pain, tolerating PO, + flatus and no problems voiding.    Objective: I have reviewed patient's vital signs, labs and pathology.  General: alert, cooperative and no distress Resp: clear to auscultation bilaterally GI: soft, non-tender; bowel sounds normal; no masses,  no organomegaly and incision: clean, dry and intact  Assessment: s/p Procedure(s): ATTEMPTED LAPAROSCOPIC SUPRACERVICAL HYSTERECTOMY CONVERTED TO OPEN AT [id number] (N/A) HYSTERECTOMY SUPRACERVICAL ABDOMINAL (N/A): stable  Plan: Discharge home  LOS: 2 days    Kali Deadwyler V 01/04/2015, 7:53 AM

## 2015-01-04 NOTE — Discharge Summary (Signed)
Physician Discharge Summary  Patient ID: Barbara Jennings MRN: [id number] DOB/AGE: 33/12/1980 34 y.o.  Admit date: 01/02/2015 Discharge date: 01/04/2015  Admission Diagnoses: heavy and painful periods, declnining conservative therapy  Discharge Diagnoses: heavy and painful periods, declining conservative therapy                                         Intraoperative bleeding requiring surgical change in approach Active Problems:   Status post abdominal supracervical subtotal hysterectomy   Discharged Condition: good  Hospital Course: Barbara Jennings is a 34 y.o. [id number] here for surgical management of heavy and painful menses. No significant preoperative concerns. We have reviewed the procedure, and alternatives : pt again confirms that she refuses consideration of IUD or Endometrial ablation. She understands the process of supracervical hyst. She declines bilateral salpingectomy after some discussion. She is aware that her weight will make surgery more difficult, and that a minilaparotomy, a widening of the trochar site at the old cesarean scar, will be done to extract the uterine specimen. The patient's last pap showed ASCUS with +HPV, and was evaluated with normal tissue on Colposcopy. The endometrium is benign on preOp endometrial biopsy. The patient underwent attempted laparoscopic supracervical hysterectomy that had to be converted to an open procedure due to retrograde bleeding on the left side that obscured adequate visibility to continue with laparoscopic procedure the previous planned minilaparotomy incision was extended sufficiently for open procedure and blood control was easily accomplished ovaries were grossly normal, and fallopian tubes were left as per preoperative discussions with the patient. Postoperative hemoglobin was 10 compared to admission hemoglobin 12.6, the patient did not require transfusion and was stable for discharge on postop day 2 as per routine for abdominal  hysterectomy.  Consults: None  Significant Diagnostic Studies: labs:  CBC Latest Ref Rng 01/03/2015 12/26/2014 10/11/2014  WBC 4.0 - 10.5 K/uL 10.5 9.1 9.2  Hemoglobin 12.0 - 15.0 g/dL 10.1(L) 12.5 13.5  Hematocrit 36.0 - 46.0 % 29.8(L) 36.8 39.7  Platelets 150 - 400 K/uL 293 304 317      Treatments: surgery: Laparoscopic supracervical hysterectomy converted to abdominal supracervical hysterectomy  Discharge Exam: Blood pressure 109/55, pulse 64, temperature 98.2 F (36.8 C), temperature source Oral, resp. rate 18, height 5\' 6"  (1.676 m), weight 283 lb (128.368 kg), SpO2 100 %. General appearance: alert, cooperative and no distress Resp: clear to auscultation bilaterally GI: soft, non-tender; bowel sounds normal; no masses,  no organomegaly and Cleaned incisions with dressings in place Skin: Skin color, texture, turgor normal. No rashes or lesions  Disposition: 01-Home or Self Care     Medication List    Notice    You have not been prescribed any medications.         Follow-up Information    Follow up with Tilda BurrowFERGUSON,Eastyn Skalla V, MD In 2 weeks.   Specialties:  Obstetrics and Gynecology, Radiology   Why:  For wound re-check   Contact information:   [address] [place] [address] [phone]       Signed: Tilda BurrowFERGUSON,Tayli Buch V 01/04/2015, 7:58 AM

## 2015-01-04 NOTE — Progress Notes (Signed)
Pt states her pain has been better controlled but that she has not rested well. Changed patient's room environment to help her rest. Incision site is intact with minimal drainage. Will continue to monitor pt.

## 2015-01-09 ENCOUNTER — Telehealth: Payer: Self-pay | Admitting: *Deleted

## 2015-01-09 MED ORDER — KETOROLAC TROMETHAMINE 10 MG PO TABS: 10.0000 mg | ORAL_TABLET | Freq: Four times a day (QID) | ORAL | Status: DC | PRN

## 2015-01-09 MED ORDER — OXYCODONE-ACETAMINOPHEN 5-325 MG PO TABS
1.0000 | ORAL_TABLET | ORAL | Status: DC | PRN
Start: 2015-01-09 — End: 2015-02-26

## 2015-01-09 NOTE — Telephone Encounter (Signed)
Pt states that she starts hurting when she takes a shower or kind of over doing it. Pt denies any drainage or odor. Pt denies any fever or warmth at incision site. Pt states that she just needs more of the pain medications that she was given.

## 2015-01-09 NOTE — Telephone Encounter (Signed)
Spoke with Dr. Emelda FearFerguson about pt and he gave a verbal order to refill the pt's pain medications. I called and advised the pt that the Toradol had been sent to the pharmacy and that she would have to come by the office and pick up the Percocet. The pt informed me that her mom would come by the office in the morning.

## 2015-01-16 ENCOUNTER — Ambulatory Visit (INDEPENDENT_AMBULATORY_CARE_PROVIDER_SITE_OTHER): Payer: Medicaid Other | Admitting: Obstetrics and Gynecology

## 2015-01-16 ENCOUNTER — Encounter: Payer: Self-pay | Admitting: Obstetrics and Gynecology

## 2015-01-16 VITALS — BP 120/70 | Ht 66.0 in | Wt 279.0 lb

## 2015-01-16 DIAGNOSIS — Z9889 Other specified postprocedural states: Secondary | ICD-10-CM

## 2015-01-16 DIAGNOSIS — Z90711 Acquired absence of uterus with remaining cervical stump: Secondary | ICD-10-CM

## 2015-01-16 NOTE — Progress Notes (Signed)
Patient ID: Barbara Jennings, female   DOB: 03/20/1981, 34 y.o.   MRN: [id number] Pt here today for incision check s/p laparoscopic Supracervical hyst converted to open procedure. Pt denies any problems or concerns at this time.  Subjective:     Barbara Jennings is a 34 y.o. female Diet:       regular without difficulty. Bowel function is: normal. Pain:     Pain is controlled with current analgesics. Medications being used: toradol, percocet.    Review of Systems Pertinent items are noted in HPI.    Objective:    BP 120/70 mmHg  Ht 5\' 6"  (1.676 m)  Wt 279 lb (126.554 kg)  BMI 45.05 kg/m2  LMP  (LMP Unknown) General:  alert, cooperative and no distress  Abdomen: soft, bowel sounds active, non-tender  Incision:   healing well, no drainage, no erythema, no hernia, no seroma, no swelling, no dehiscence, incision well approximated       Pelvic: declined    Assessment:    Doing well postoperatively. s/p open supracervical hyst Operative findings again reviewed. Pathology report discussed.    Plan:    1. Continue any current medications. 2. Wound care discussed. 3. Activity restrictions: no lifting more than 20 pounds 4. Anticipated return to work: 2-3 weeks. 5. Follow up: 4 week for  Final gyn check postop.

## 2015-01-17 ENCOUNTER — Telehealth: Payer: Self-pay | Admitting: Obstetrics and Gynecology

## 2015-01-17 ENCOUNTER — Telehealth: Payer: Self-pay | Admitting: *Deleted

## 2015-01-17 MED ORDER — KETOROLAC TROMETHAMINE 10 MG PO TABS: 10.0000 mg | ORAL_TABLET | Freq: Four times a day (QID) | ORAL | Status: DC | PRN

## 2015-01-17 MED ORDER — IBUPROFEN 600 MG PO TABS: 600.0000 mg | ORAL_TABLET | Freq: Four times a day (QID) | ORAL | Status: DC | PRN

## 2015-01-17 NOTE — Telephone Encounter (Signed)
Pt aware that Dr. Emelda FearFerguson refilled her toradol and that it had been sent to the pharmacy.

## 2015-01-17 NOTE — Telephone Encounter (Addendum)
Pt called stating that the pharmacy told her that her insurance will not cover a refill of the toradol. I called the pharmacy and they said that her insurance would not cover another Rx for that because it had been too many days.   I spoke with Dr. Emelda FearFerguson and he gave a verbal order for Ibuprofen 600mg  1 tablet po every 6 hours dispense number 60. [facility]

## 2015-01-17 NOTE — Telephone Encounter (Signed)
Pt aware that Rx has been sent to the pharmacy.  

## 2015-01-20 ENCOUNTER — Emergency Department (HOSPITAL_COMMUNITY)
Admission: EM | Admit: 2015-01-20 | Discharge: 2015-01-20 | Disposition: A | Discharge status: Home / Self Care | Payer: Medicaid Other | Attending: Emergency Medicine | Admitting: Emergency Medicine

## 2015-01-20 ENCOUNTER — Encounter (HOSPITAL_COMMUNITY): Payer: Self-pay | Admitting: Emergency Medicine

## 2015-01-20 DIAGNOSIS — Z79899 Other long term (current) drug therapy: Secondary | ICD-10-CM | POA: Insufficient documentation

## 2015-01-20 DIAGNOSIS — Z8679 Personal history of other diseases of the circulatory system: Secondary | ICD-10-CM | POA: Diagnosis not present

## 2015-01-20 DIAGNOSIS — G8918 Other acute postprocedural pain: Secondary | ICD-10-CM | POA: Insufficient documentation

## 2015-01-20 DIAGNOSIS — Z87448 Personal history of other diseases of urinary system: Secondary | ICD-10-CM | POA: Diagnosis not present

## 2015-01-20 DIAGNOSIS — Z8619 Personal history of other infectious and parasitic diseases: Secondary | ICD-10-CM | POA: Diagnosis not present

## 2015-01-20 DIAGNOSIS — Z5189 Encounter for other specified aftercare: Secondary | ICD-10-CM

## 2015-01-20 DIAGNOSIS — Z9049 Acquired absence of other specified parts of digestive tract: Secondary | ICD-10-CM | POA: Insufficient documentation

## 2015-01-20 DIAGNOSIS — Z720 Tobacco use: Secondary | ICD-10-CM | POA: Insufficient documentation

## 2015-01-20 DIAGNOSIS — J45909 Unspecified asthma, uncomplicated: Secondary | ICD-10-CM | POA: Insufficient documentation

## 2015-01-20 DIAGNOSIS — Z8659 Personal history of other mental and behavioral disorders: Secondary | ICD-10-CM | POA: Diagnosis not present

## 2015-01-20 DIAGNOSIS — Z9889 Other specified postprocedural states: Secondary | ICD-10-CM | POA: Insufficient documentation

## 2015-01-20 DIAGNOSIS — Z4801 Encounter for change or removal of surgical wound dressing: Secondary | ICD-10-CM | POA: Diagnosis not present

## 2015-01-20 LAB — CBC WITH DIFFERENTIAL/PLATELET
Basophils Absolute: 0.1 10*3/uL (ref 0.0–0.1)
Basophils Relative: 1 % (ref 0–1)
Eosinophils Absolute: 0.3 10*3/uL (ref 0.0–0.7)
Eosinophils Relative: 4 % (ref 0–5)
HCT: 33.2 % — ABNORMAL LOW (ref 36.0–46.0)
Hemoglobin: 11.2 g/dL — ABNORMAL LOW (ref 12.0–15.0)
Lymphocytes Relative: 37 % (ref 12–46)
Lymphs Abs: 2.7 10*3/uL (ref 0.7–4.0)
MCH: 28.8 pg (ref 26.0–34.0)
MCHC: 33.7 g/dL (ref 30.0–36.0)
MCV: 85.3 fL (ref 78.0–100.0)
Monocytes Absolute: 0.4 10*3/uL (ref 0.1–1.0)
Monocytes Relative: 5 % (ref 3–12)
NEUTROS ABS: 4 10*3/uL (ref 1.7–7.7)
NEUTROS PCT: 53 % (ref 43–77)
Platelets: 327 10*3/uL (ref 150–400)
RBC: 3.89 MIL/uL (ref 3.87–5.11)
RDW: 13.1 % (ref 11.5–15.5)
WBC: 7.4 10*3/uL (ref 4.0–10.5)

## 2015-01-20 LAB — URINALYSIS, ROUTINE W REFLEX MICROSCOPIC
Bilirubin Urine: NEGATIVE
Glucose, UA: NEGATIVE mg/dL
HGB URINE DIPSTICK: NEGATIVE
Ketones, ur: NEGATIVE mg/dL
LEUKOCYTES UA: NEGATIVE
NITRITE: NEGATIVE
Protein, ur: NEGATIVE mg/dL
Specific Gravity, Urine: 1.025 (ref 1.005–1.030)
Urobilinogen, UA: 0.2 mg/dL (ref 0.0–1.0)
pH: 6 (ref 5.0–8.0)

## 2015-01-20 MED ORDER — HYDROCODONE-ACETAMINOPHEN 5-325 MG PO TABS: 2.0000 | ORAL_TABLET | Freq: Once | ORAL | Status: DC

## 2015-01-20 MED ORDER — HYDROCODONE-ACETAMINOPHEN 5-325 MG PO TABS
2.0000 | ORAL_TABLET | Freq: Once | ORAL | Status: AC
  Administered 2015-01-20: 2 via ORAL
  Filled 2015-01-20: qty 2

## 2015-01-20 MED ORDER — HYDROCODONE-ACETAMINOPHEN 7.5-325 MG PO TABS: 1.0000 | ORAL_TABLET | ORAL | Status: DC | PRN

## 2015-01-20 NOTE — ED Notes (Signed)
Patient had hysterotomy on 01/02/2015 done by Dr Tyrell AntonioFurgeson. Per patient pain had dissipated from surgery and incision was healing until yesterday she started having "reddish brown drainage." Patient reports pain, more noted with movement. Denies any fevers.

## 2015-01-20 NOTE — ED Notes (Signed)
Patient given discharge instruction, verbalized understand. Patient ambulatory out of the department.  

## 2015-01-20 NOTE — ED Provider Notes (Signed)
CSN: [id number]     Arrival date & time 01/20/15  4098 History   First MD Initiated Contact with Patient 01/20/15 0840     Chief Complaint  Patient presents with  . Drainage from Incision     (Consider location/radiation/quality/duration/timing/severity/associated sxs/prior Treatment) HPI Comments: Patient is a 34 year old female who presents to the emergency department with a complaint of lower abdomen pain, and drainage from my incision site.  The patient states that on January 5 she underwent a hysterectomy by Dr. Emelda Fear. Review of the medical records reveals that this was an open hysterectomy on. The patient states that she had been doing well, the pain from the surgery had mostly resolved, until yesterday January 22 when she started noticing soreness at the incision site, a sensation that "my insides are falling out" when she stands and walks, and also noted a reddish brown drainage from the incision site. There's been no high fever reported. The patient has not noted red streaking at the incision site. His been no injury or trauma reported. It is also of note that the patient was examined by Dr. Emelda Fear on January 19, and at that time the examination showed no acute problems. The patient presents at this time for assistance with this. She has tried ibuprofen for her soreness and pain, she states this is doing very little for her pain.  The history is provided by the patient.    Past Medical History  Diagnosis Date  . Asthma   . Anxiety   . Panic attack   . Depression   . Migraine   . Abnormal Pap smear   . HSV-2 (herpes simplex virus 2) infection   . Vaginal Pap smear, abnormal   . Unspecified symptom associated with female genital organs 01/27/2014  . 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 Other and unspecified ovarian cyst 02/06/2014  . Hx of migraines 02/09/2014    Has ?aura,  will rx POP  . Vulvar abscess 03/09/2014    [facility]&D 3/9 in ER at [facility] was rx'd bactrim ds x 10 days  . Abnormal uterine bleeding (AUB) 10/11/2014  . Dysmenorrhea 10/11/2014  . Thickened endometrium 10/18/2014  . Ovarian cyst 10/18/2014   Past Surgical History  Procedure Laterality Date  . Cesarean section    . Ovarian cyst removal    . Appendectomy    . Cholecystectomy    . Dilation and curettage of uterus    . Laparoscopic supracervical hysterectomy N/A 01/02/2015    Procedure: ATTEMPTED LAPAROSCOPIC SUPRACERVICAL HYSTERECTOMY CONVERTED TO OPEN AT 1191;  Surgeon: Tilda Burrow, MD;  Location: [facility];  Service: Gynecology;  Laterality: N/A;  . Supracervical abdominal hysterectomy N/A 01/02/2015    Procedure: HYSTERECTOMY SUPRACERVICAL ABDOMINAL;  Surgeon: Tilda Burrow, MD;  Location: [facility];  Service: Gynecology;  Laterality: N/A;  . Abdominal hysterectomy     Family History  Problem Relation Age of Onset  . Ulcers Father   . Cancer Maternal Aunt     breast   . Diabetes Maternal Grandmother   . Diabetes Maternal Grandfather   . Alzheimer's disease Paternal Grandmother   . Diabetes Maternal Aunt   . Cancer Paternal Aunt     cervical   History  Substance Use Topics  . Smoking status: Current Every Day Smoker -- 0.50 packs/day for 12 years    Types: Cigarettes  . Smokeless tobacco: Never Used  .  Alcohol Use: Yes     Comment: socially   OB History    Gravida Para Term Preterm AB TAB SAB Ectopic Multiple Living   7 2 1 1 5  5   1      Review of Systems  Constitutional: Negative for activity change.       All ROS Neg except as noted in HPI  HENT: Negative for nosebleeds.   Eyes: Negative for photophobia and discharge.  Respiratory: Negative for cough, shortness of breath and wheezing.   Cardiovascular: Negative for chest pain and palpitations.  Gastrointestinal: Positive for abdominal pain. Negative for vomiting and blood in stool.  Genitourinary: Positive for vaginal  pain. Negative for dysuria, frequency and hematuria.  Musculoskeletal: Negative for back pain, arthralgias and neck pain.  Skin: Negative.   Neurological: Negative for dizziness, seizures and speech difficulty.  Psychiatric/Behavioral: Negative for hallucinations and confusion.      Allergies  Coconut flavor  Home Medications   Prior to Admission medications   Medication Sig Start Date End Date Taking? Authorizing Provider  ibuprofen (ADVIL,MOTRIN) 600 MG tablet Take 1 tablet (600 mg total) by mouth every 6 (six) hours as needed for moderate pain. 01/17/15  Yes Tilda BurrowJohn Ferguson V, MD  ketorolac (TORADOL) 10 MG tablet Take 1 tablet (10 mg total) by mouth every 6 (six) hours as needed (five day limit postop). 01/17/15  Yes Tilda BurrowJohn Ferguson V, MD  oxyCODONE-acetaminophen (PERCOCET/ROXICET) 5-325 MG per tablet Take 1-2 tablets by mouth every 4 (four) hours as needed for severe pain (moderate to severe pain (when tolerating fluids)). 01/09/15  Yes Tilda BurrowJohn Ferguson V, MD   BP 140/90 mmHg  Pulse 89  Temp(Src) 97.9 F (36.6 C) (Oral)  Resp 18  Ht 5\' 6"  (1.676 m)  Wt 274 lb (124.286 kg)  BMI 44.25 kg/m2  SpO2 100%  LMP  (LMP Unknown) Physical Exam  Constitutional: She is oriented to person, place, and time. She appears well-developed and well-nourished.  Non-toxic appearance.  HENT:  Head: Normocephalic.  Right Ear: Tympanic membrane and external ear normal.  Left Ear: Tympanic membrane and external ear normal.  Eyes: EOM and lids are normal. Pupils are equal, round, and reactive to light.  Neck: Normal range of motion. Neck supple. Carotid bruit is not present.  Cardiovascular: Normal rate, regular rhythm, normal heart sounds, intact distal pulses and normal pulses.   Pulmonary/Chest: Breath sounds normal. No respiratory distress.  Abdominal: Soft. Bowel sounds are normal. There is tenderness. There is no guarding.    No visible or palpable abscess. No dehiscence. No red streaking. No mass or  concern for hernia.  Musculoskeletal: Normal range of motion.  Lymphadenopathy:       Head (right side): No submandibular adenopathy present.       Head (left side): No submandibular adenopathy present.    She has no cervical adenopathy.  Neurological: She is alert and oriented to person, place, and time. She has normal strength. No cranial nerve deficit or sensory deficit.  Skin: Skin is warm and dry.  Psychiatric: She has a normal mood and affect. Her speech is normal.  Nursing note and vitals reviewed.   ED Course  Procedures (including critical care time) Labs Review Labs Reviewed  URINALYSIS, ROUTINE W REFLEX MICROSCOPIC - Abnormal; Notable for the following:    APPearance HAZY (*)    All other components within normal limits  CBC WITH DIFFERENTIAL/PLATELET    Imaging Review No results found.   EKG Interpretation None  MDM  Vital signs are well within normal limits. Pulse oximetry is 100% on room air. Within normal limits by my interpretation. Urinalysis was within normal limits. No evidence of infection. Complete blood count is well within normal limits. In particular the WBC is normal at 7.4, platelets are normal at 327,000. There is no shift to the left.  The patient has 2 small superficial areas that have not sealed yet at the wound site. Suspect the drainages from the superficial areas. I do not find any evidence of abscess, cellulitis , hernia, or dehiscence. The plan at this time is for the patient to continue to bandage the area, observed for increase in the amount of drainage, observed for any changes in temperature elevation, the patient will be given Norco to add to her ibuprofen for soreness. Patient is to follow-up with Dr. Emelda Fear on Monday or Tuesday of next week.    Final diagnoses:  None    **I have reviewed nursing notes, vital signs, and all appropriate lab and imaging results for this patient.Kathie Dike, PA-C 01/20/15 1002

## 2015-01-20 NOTE — Discharge Instructions (Signed)
Your urine test is within normal limits. Your complete blood count shows no evidence of systemic infection, or major problem. Your examination does not show evidence of cellulitis, abscess, or dehiscence. Please apply dressing to the surgical area until the 2 open areas sealed. Please continue your ibuprofen, 600 mg every 6 hours. May use Norco for more severe pain. Please see Dr. Emelda FearFerguson on Monday or Tuesday of next week. Please take the prescription medication with you so that he can make it a part of your record. Please return to the emergency department, if any emergent changes, problems, or concerns before your appointment. Wound Check Your wound appears healthy today. Your wound will heal gradually over time. Eventually a scar will form that will fade with time. FACTORS THAT AFFECT SCAR FORMATION:  People differ in the severity in which they scar.  Scar severity varies according to location, size, and the traits you inherited from your parents (genetic predisposition).  Irritation to the wound from infection, rubbing, or chemical exposure will increase the amount of scar formation. HOME CARE INSTRUCTIONS   If you were given a dressing, you should change it at least once a day or as instructed by your caregiver. If the bandage sticks, soak it off with a solution of hydrogen peroxide.  If the bandage becomes wet, dirty, or develops a bad smell, change it as soon as possible.  Look for signs of infection.  Only take over-the-counter or prescription medicines for pain, discomfort, or fever as directed by your caregiver. SEEK IMMEDIATE MEDICAL CARE IF:   You have redness, swelling, or increasing pain in the wound.  You notice pus coming from the wound.  You have a fever.  You notice a bad smell coming from the wound or dressing. Document Released: 09/20/2004 Document Revised: 03/08/2012 Document Reviewed: 12/15/2005 [facility] Patient Information 2015 [employer] [place]. This information  is not intended to replace advice given to you by your health care provider. Make sure you discuss any questions you have with your health care provider.

## 2015-02-08 ENCOUNTER — Telehealth: Payer: Self-pay | Admitting: Obstetrics and Gynecology

## 2015-02-08 NOTE — Telephone Encounter (Signed)
Pt seen in Ed for incision check , doing well now. Has appt on 15th

## 2015-02-12 ENCOUNTER — Ambulatory Visit: Payer: Medicaid Other | Admitting: Obstetrics and Gynecology

## 2015-02-13 ENCOUNTER — Ambulatory Visit: Payer: Medicaid Other | Admitting: Obstetrics and Gynecology

## 2015-02-14 ENCOUNTER — Ambulatory Visit: Payer: Medicaid Other | Admitting: Obstetrics and Gynecology

## 2015-02-26 ENCOUNTER — Ambulatory Visit (INDEPENDENT_AMBULATORY_CARE_PROVIDER_SITE_OTHER): Payer: Medicaid Other | Admitting: Obstetrics and Gynecology

## 2015-02-26 ENCOUNTER — Encounter: Payer: Self-pay | Admitting: Obstetrics and Gynecology

## 2015-02-26 VITALS — BP 118/76 | HR 82 | Ht 66.0 in | Wt 283.0 lb

## 2015-02-26 DIAGNOSIS — Z9889 Other specified postprocedural states: Secondary | ICD-10-CM

## 2015-02-26 MED ORDER — IBUPROFEN 600 MG PO TABS: 600.0000 mg | ORAL_TABLET | Freq: Four times a day (QID) | ORAL | Status: DC | PRN

## 2015-02-26 MED ORDER — OXYCODONE-ACETAMINOPHEN 5-325 MG PO TABS: 1.0000 | ORAL_TABLET | ORAL | Status: DC | PRN

## 2015-02-26 NOTE — Progress Notes (Signed)
Patient ID: TYRONE BALASH, female   DOB: 1981/09/10, 34 y.o.   MRN: [id number]    [facility]  Patient name: Barbara Jennings MRN [id number]  Date of birth: [birth year]  CC & HPI:  Barbara Jennings is a 34 y.o. female s/p supracervical abdominal hysterectomy presenting today for follow-up after laparoscopic supracervical hysterectomy converted to open procedure. Incision site has some granular tissue. She uses Ibuprofen for pain and had discontinued use of Toradol and Hydrocodone.   ROS:  A complete 10 system review of systems was obtained and all systems are negative except as noted in the HPI and PMH.   Pertinent History Reviewed:   Reviewed: Significant for supracervical abdominal hysterectomy Medical         Past Medical History  Diagnosis Date  . Asthma   . Anxiety   . Panic attack   . Depression   . Migraine   . Abnormal Pap smear   . HSV-2 (herpes simplex virus 2) infection   . Vaginal Pap smear, abnormal   . Unspecified symptom associated with female genital organs 01/27/2014  . 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 Other and unspecified ovarian cyst 02/06/2014  . Hx of migraines 02/09/2014    Has ?aura, will rx POP  . Vulvar abscess 03/09/2014    I&D 3/9 in ER at [facility] was rx'd bactrim ds x 10 days  . Abnormal uterine bleeding (AUB) 10/11/2014  . Dysmenorrhea 10/11/2014  . Thickened endometrium 10/18/2014  . Ovarian cyst 10/18/2014                              Surgical Hx:    Past Surgical History  Procedure Laterality Date  . Cesarean section    . Ovarian cyst removal    . Appendectomy    . Cholecystectomy    . Dilation and curettage of uterus    . Laparoscopic supracervical hysterectomy N/A 01/02/2015    Procedure: ATTEMPTED LAPAROSCOPIC SUPRACERVICAL HYSTERECTOMY CONVERTED TO OPEN AT 0865;  Surgeon: Tilda Burrow, MD;  Location: [facility];  Service:  Gynecology;  Laterality: N/A;  . Supracervical abdominal hysterectomy N/A 01/02/2015    Procedure: HYSTERECTOMY SUPRACERVICAL ABDOMINAL;  Surgeon: Tilda Burrow, MD;  Location: [facility];  Service: Gynecology;  Laterality: N/A;  . Abdominal hysterectomy     Medications: Reviewed & Updated - see associated section                       Current outpatient prescriptions:  .  ibuprofen (ADVIL,MOTRIN) 600 MG tablet, Take 1 tablet (600 mg total) by mouth every 6 (six) hours as needed for moderate pain., Disp: 60 tablet, Rfl: 1 .  [DISCONTINUED] citalopram (CELEXA) 20 MG tablet, Take 20 mg by mouth daily.  , Disp: , Rfl:    Social History: Reviewed -  reports that she has been smoking Cigarettes.  She has a 6 pack-year smoking history. She has never used smokeless tobacco.  Objective Findings:  Vitals: Blood pressure 118/76, pulse 82, height  (1.676 m), weight 283 lb (128.368 kg).  Physical Examination: General appearance - alert, well appearing, and in no distress Mental status - alert, oriented to person, place, and time Pelvic - normal external genitalia, vulva, vagina, cervix VULVA: normal appearing vulva with no masses, tenderness or  lesions VAGINA: normal appearing vagina with normal color and discharge, no lesions CERVIX: normal appearing cervix without discharge or lesions; mild discomfort with palpation UTERUS: surgically absent ADNEXA: surgically absent   Assessment & Plan:   A:  1. Granulation tissue over incision site 2. Mild tenderness over cervix with palpation, felt wnl for postop state 3. Discontinued Toradol and Hydrocodone  P:  1. Resume sexual activity  2. Follow-up as needed through 03/2015 3. Refill Ibuprofen prescription and refil HC 5/325 x 20 tabs    This chart was scribed for Tilda BurrowJohn Kurk Corniel V, MD by Gwenyth Oberatherine Macek, ED Scribe. This patient was seen in room 3 and the patient's care was started at 2:20 PM.   I personally performed the services described in  this documentation, which was SCRIBED in my presence. The recorded information has been reviewed and considered accurate. It has been edited as necessary during review. Tilda BurrowFERGUSON,Adelbert Gaspard V, MD

## 2015-02-26 NOTE — Progress Notes (Signed)
Patient ID: Barbara Jennings, female   DOB: 05/29/1981, 34 y.o.   MRN: [id number] Pt here today for post op visit.

## 2015-03-29 ENCOUNTER — Telehealth: Payer: Self-pay | Admitting: *Deleted

## 2015-03-29 NOTE — Telephone Encounter (Signed)
Pt informed possible UTI and would need to make an appt with Dr. Emelda FearFerguson for the other c/o passing a "blood clot after her hysterectomy from 01/02/2015."

## 2015-04-20 ENCOUNTER — Emergency Department (HOSPITAL_COMMUNITY): Payer: Medicaid Other

## 2015-04-20 ENCOUNTER — Emergency Department (HOSPITAL_COMMUNITY)
Admission: EM | Admit: 2015-04-20 | Discharge: 2015-04-20 | Disposition: A | Discharge status: Home / Self Care | Payer: Medicaid Other | Attending: Emergency Medicine | Admitting: Emergency Medicine

## 2015-04-20 ENCOUNTER — Encounter (HOSPITAL_COMMUNITY): Payer: Self-pay | Admitting: Emergency Medicine

## 2015-04-20 DIAGNOSIS — Z7982 Long term (current) use of aspirin: Secondary | ICD-10-CM | POA: Diagnosis not present

## 2015-04-20 DIAGNOSIS — Z8619 Personal history of other infectious and parasitic diseases: Secondary | ICD-10-CM | POA: Diagnosis not present

## 2015-04-20 DIAGNOSIS — Z8659 Personal history of other mental and behavioral disorders: Secondary | ICD-10-CM | POA: Diagnosis not present

## 2015-04-20 DIAGNOSIS — G43909 Migraine, unspecified, not intractable, without status migrainosus: Secondary | ICD-10-CM | POA: Insufficient documentation

## 2015-04-20 DIAGNOSIS — J45909 Unspecified asthma, uncomplicated: Secondary | ICD-10-CM | POA: Diagnosis not present

## 2015-04-20 DIAGNOSIS — R51 Headache: Secondary | ICD-10-CM

## 2015-04-20 DIAGNOSIS — R519 Headache, unspecified: Secondary | ICD-10-CM

## 2015-04-20 DIAGNOSIS — Z720 Tobacco use: Secondary | ICD-10-CM | POA: Insufficient documentation

## 2015-04-20 DIAGNOSIS — Z8742 Personal history of other diseases of the female genital tract: Secondary | ICD-10-CM | POA: Insufficient documentation

## 2015-04-20 MED ORDER — METOCLOPRAMIDE HCL 5 MG/ML IJ SOLN
10.0000 mg | Freq: Once | INTRAMUSCULAR | Status: AC
  Administered 2015-04-20: 10 mg via INTRAVENOUS
  Filled 2015-04-20: qty 2

## 2015-04-20 MED ORDER — DEXAMETHASONE SODIUM PHOSPHATE 4 MG/ML IJ SOLN: 10.0000 mg | Freq: Once | INTRAMUSCULAR | Status: DC

## 2015-04-20 MED ORDER — VALPROATE SODIUM 500 MG/5ML IV SOLN
500.0000 mg | Freq: Once | INTRAVENOUS | Status: DC
  Filled 2015-04-20: qty 5

## 2015-04-20 MED ORDER — SODIUM CHLORIDE 0.9 % IJ SOLN
INTRAMUSCULAR | Status: AC
  Filled 2015-04-20: qty 90

## 2015-04-20 MED ORDER — SODIUM CHLORIDE 0.9 % IV BOLUS (SEPSIS)
1000.0000 mL | Freq: Once | INTRAVENOUS | Status: AC
  Administered 2015-04-20: 1000 mL via INTRAVENOUS

## 2015-04-20 MED ORDER — SODIUM CHLORIDE 0.9 % IJ SOLN
INTRAMUSCULAR | Status: AC
  Filled 2015-04-20: qty 1000

## 2015-04-20 MED ORDER — KETOROLAC TROMETHAMINE 30 MG/ML IJ SOLN
30.0000 mg | Freq: Once | INTRAMUSCULAR | Status: AC
  Administered 2015-04-20: 30 mg via INTRAVENOUS
  Filled 2015-04-20: qty 1

## 2015-04-20 MED ORDER — IOHEXOL 350 MG/ML SOLN
80.0000 mL | Freq: Once | INTRAVENOUS | Status: AC | PRN
  Administered 2015-04-20: 80 mL via INTRAVENOUS

## 2015-04-20 MED ORDER — DIPHENHYDRAMINE HCL 50 MG/ML IJ SOLN
25.0000 mg | Freq: Once | INTRAMUSCULAR | Status: AC
  Administered 2015-04-20: 25 mg via INTRAVENOUS
  Filled 2015-04-20: qty 1

## 2015-04-20 NOTE — Discharge Instructions (Signed)

## 2015-04-20 NOTE — ED Notes (Signed)
Pt reports waking with a migraine this am approx 0300. Pt reports nausea but no vomiting.

## 2015-04-20 NOTE — ED Provider Notes (Signed)
CSN: [id number]     Arrival date & time 04/20/15  1726 History   First MD Initiated Contact with Patient 04/20/15 2014     Chief Complaint  Patient presents with  . Migraine     (Consider location/radiation/quality/duration/timing/severity/associated sxs/prior Treatment) HPI Comments: Patient reports "migraine" headache that woke her from sleep around 3 AM with some become progressively worse throughout the day. Feels like a usual migraine left side of her head with pain in her left neck. She endorses nausea, photophobia, phonophobia but no vomiting. No fever. No focal weakness, numbness or tingling. She took Excedrin and ibuprofen without relief. She is to be on Topamax but has not been for the past 4 years. She hit her head yesterday on a cabinet but did not have any pain initially or pain last night. Denies any visual changes but has photophobia. No chest pain or shortness of breath. No abdominal pain. Denies thunderclap onset and states headache came on gradually. She does not take any blood thinners.  The history is provided by the patient.    Past Medical History  Diagnosis Date  . Asthma   . Anxiety   . Panic attack   . Depression   . Migraine   . Abnormal Pap smear   . HSV-2 (herpes simplex virus 2) infection   . Vaginal Pap smear, abnormal   . Unspecified symptom associated with female genital organs 01/27/2014  . 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 Other and unspecified ovarian cyst 02/06/2014  . Hx of migraines 02/09/2014    Has ?aura, will rx POP  . Vulvar abscess 03/09/2014    [facility] b[facility]rim ds x 10 days  . Abnormal uterine bleeding (AUB) 10/11/2014  . Dysmenorrhea 10/11/2014  . Thickened endometrium 10/18/2014  . Ovarian cyst 10/18/2014   Past Surgical History  Procedure Laterality Date  . Cesarean section    . Ovarian cyst removal    .  Appendectomy    . Cholecystectomy    . Dilation and curettage of uterus    . Laparoscopic supracervical hysterectomy N/A 01/02/2015    Procedure: ATTEMPTED LAPAROSCOPIC SUPRACERVICAL HYSTERECTOMY CONVERTED TO OPEN AT [id number];  Surgeon: Tilda BurrowJohn Ferguson V, MD;  Location: [facility];  Service: Gynecology;  Laterality: N/A;  . Supracervical abdominal hysterectomy N/A 01/02/2015    Procedure: HYSTERECTOMY SUPRACERVICAL ABDOMINAL;  Surgeon: Tilda BurrowJohn Ferguson V, MD;  Location: [facility];  Service: Gynecology;  Laterality: N/A;  . Abdominal hysterectomy     Family History  Problem Relation Age of Onset  . Ulcers Father   . Cancer Maternal Aunt     breast   . Diabetes Maternal Grandmother   . Diabetes Maternal Grandfather   . Alzheimer's disease Paternal Grandmother   . Diabetes Maternal Aunt   . Cancer Paternal Aunt     cervical   History  Substance Use Topics  . Smoking status: Current Every Day Smoker -- 0.50 packs/day for 12 years    Types: Cigarettes  . Smokeless tobacco: Never Used  . Alcohol Use: Yes     Comment: socially   OB History    Gravida Para Term Preterm AB TAB SAB Ectopic Multiple Living   7 2 1 1 5  5   1      Review of Systems  Constitutional: Negative for fever, activity change and appetite change.  HENT: Negative for  congestion and rhinorrhea.   Eyes: Positive for photophobia. Negative for visual disturbance.  Respiratory: Negative for cough, chest tightness and shortness of breath.   Cardiovascular: Negative for chest pain.  Gastrointestinal: Positive for nausea. Negative for vomiting and abdominal pain.  Genitourinary: Negative for dysuria, hematuria, vaginal bleeding and vaginal discharge.  Musculoskeletal: Negative for myalgias, back pain and arthralgias.  Skin: Negative for rash.  Neurological: Positive for light-headedness and headaches. Negative for dizziness and weakness.  A complete 10 system review of systems was obtained and all systems are negative except as noted in  the HPI and PMH.      Allergies  Coconut flavor  Home Medications   Prior to Admission medications   Medication Sig Start Date End Date Taking? Authorizing Provider  aspirin-acetaminophen-caffeine (EXCEDRIN MIGRAINE) [phone] MG per tablet Take 1-2 tablets by mouth every 6 (six) hours as needed for headache.   Yes Historical Provider, MD  ibuprofen (ADVIL,MOTRIN) 200 MG tablet Take 200 mg by mouth every 6 (six) hours as needed for mild pain or moderate pain.   Yes Historical Provider, MD  ibuprofen (ADVIL,MOTRIN) 600 MG tablet Take 1 tablet (600 mg total) by mouth every 6 (six) hours as needed for moderate pain. Patient not taking: Reported on 04/20/2015 02/26/15   Tilda Burrow, MD  oxyCODONE-acetaminophen (PERCOCET/ROXICET) 5-325 MG per tablet Take 1-2 tablets by mouth every 4 (four) hours as needed for severe pain (moderate to severe pain (when tolerating fluids)). Patient not taking: Reported on 04/20/2015 02/26/15   Tilda Burrow, MD   BP 118/66 mmHg  Pulse 61  Temp(Src) 98.2 F (36.8 C) (Oral)  Resp 16  Ht  (1.676 m)  Wt 282 lb (127.914 kg)  BMI 45.54 kg/m2  SpO2 95%  LMP  (LMP Unknown) Physical Exam  Constitutional: She is oriented to person, place, and time. She appears well-developed and well-nourished. No distress.  HENT:  Head: Normocephalic and atraumatic.  Mouth/Throat: Oropharynx is clear and moist. No oropharyngeal exudate.  Eyes: Conjunctivae and EOM are normal. Pupils are equal, round, and reactive to light.  photophobic  Neck: Normal range of motion. Neck supple.  No meningismus.  Cardiovascular: Normal rate, regular rhythm, normal heart sounds and intact distal pulses.   No murmur heard. Pulmonary/Chest: Effort normal and breath sounds normal. No respiratory distress. She exhibits no tenderness.  Abdominal: Soft. There is no tenderness. There is no rebound and no guarding.  Musculoskeletal: Normal range of motion. She exhibits no edema or  tenderness.  Neurological: She is alert and oriented to person, place, and time. No cranial nerve deficit. She exhibits normal muscle tone. Coordination normal.  No ataxia on finger to nose bilaterally. No pronator drift. 5/5 strength throughout. CN 2-12 intact. Negative Romberg. Equal grip strength. Sensation intact. Gait is normal.   Skin: Skin is warm.  Psychiatric: She has a normal mood and affect. Her behavior is normal.  Nursing note and vitals reviewed.   ED Course  Procedures (including critical care time) Labs Review Labs Reviewed - No data to display  Imaging Review Ct Angio Head W/cm &/or Wo Cm  04/20/2015   CLINICAL DATA:  Initial evaluation for acute left-sided headache, hit left side of head yesterday. History of migraines.  EXAM: CT ANGIOGRAPHY HEAD AND NECK  TECHNIQUE: Multidetector CT imaging of the head and neck was performed using the standard protocol during bolus administration of intravenous contrast. Multiplanar CT image reconstructions and MIPs were obtained to evaluate the vascular anatomy. Carotid stenosis  measurements (when applicable) are obtained utilizing NASCET criteria, using the distal internal carotid diameter as the denominator.  CONTRAST:  80mL OMNIPAQUE IOHEXOL 350 MG/ML SOLN  COMPARISON:  Prior noncontrast head CT performed earlier on the same day.  FINDINGS: CTA NECK  Aortic arch: Visualized aortic arch is of normal caliber with normal 3 vessel morphology. No high-grade stenosis seen at the origin of the great vessels. Subclavian arteries widely patent.  Right carotid system: Right common carotid artery is well opacified from its origin to the carotid bifurcation. Right internal carotid artery well opacified from the carotid bifurcation to the skullbase. No evidence for stenosis, dissection, or occlusion.  Left carotid system: Left common carotid artery well opacified from its origin to the carotid bifurcation. Left internal carotid artery well opacified from the  carotid bifurcation to the skullbase. No evidence for dissection, occlusion, or stenosis.  Vertebral arteries:Both vertebral arteries arise from the subclavian arteries. The left vertebral artery is dominant and widely patent to the skullbase. Diminutive right vertebral artery patent as well. No evidence for vertebral artery dissection or other acute abnormality.  Skeleton: No acute osseous abnormality identified. No worrisome lytic or blastic osseous lesions.  Other neck: No acute soft tissue abnormality within the neck. No adenopathy. Thyroid gland normal. Visualized superior mediastinum grossly within normal limits. Visualized lungs are clear.  CTA HEAD  Anterior circulation: The petrous, cavernous, and supra clinoid segments of the internal carotid arteries are widely patent bilaterally. A1 segments, anterior communicating artery, and anterior cerebral arteries well opacified.  M1 segments widely patent without stenosis or occlusion. MCA bifurcations normal. Distal MCA branches well opacified and symmetric bilaterally.  Posterior circulation: Left vertebral artery is dominant and widely patent to the vertebrobasilar junction. The diminutive right vertebral artery patent as well. Posterior inferior cerebral arteries well opacified. Basilar artery mildly tortuous but patent. Superior cerebellar arteries and posterior cerebral arteries widely patent bilaterally.  Venous sinuses: No abnormality identified within the venous sinuses.  Anatomic variants: No anatomic variant.  No aneurysm.  Delayed phase: No abnormal enhancement on delayed sequences.  IMPRESSION: Normal CTA of the head and neck.   Electronically Signed   By: Rise Mu M.D.   On: 04/20/2015 22:28   Ct Head Wo Contrast  04/20/2015   CLINICAL DATA:  Initial evaluation for acute headache. Hit left side of head yesterday.  EXAM: CT HEAD WITHOUT CONTRAST  TECHNIQUE: Contiguous axial images were obtained from the base of the skull through the  vertex without intravenous contrast.  COMPARISON:  Prior study from 12/08/2013  FINDINGS: There is no acute intracranial hemorrhage or infarct. No mass lesion or midline shift. Gray-white matter differentiation is well maintained. Ventricles are normal in size without evidence of hydrocephalus. CSF containing spaces are within normal limits. No extra-axial fluid collection.  The calvarium is intact.  Orbital soft tissues are within normal limits.  Minimal opacity present within the right frontoethmoidal recess. Paranasal sinuses are otherwise clear. No mastoid effusion.  Scalp soft tissues are unremarkable.  IMPRESSION: Normal head CT with no acute intracranial process identified.   Electronically Signed   By: Rise Mu M.D.   On: 04/20/2015 21:26   Ct Angio Neck W/cm &/or Wo/cm  04/20/2015   CLINICAL DATA:  Initial evaluation for acute left-sided headache, hit left side of head yesterday. History of migraines.  EXAM: CT ANGIOGRAPHY HEAD AND NECK  TECHNIQUE: Multidetector CT imaging of the head and neck was performed using the standard protocol during bolus administration of intravenous  contrast. Multiplanar CT image reconstructions and MIPs were obtained to evaluate the vascular anatomy. Carotid stenosis measurements (when applicable) are obtained utilizing NASCET criteria, using the distal internal carotid diameter as the denominator.  CONTRAST:  80mL OMNIPAQUE IOHEXOL 350 MG/ML SOLN  COMPARISON:  Prior noncontrast head CT performed earlier on the same day.  FINDINGS: CTA NECK  Aortic arch: Visualized aortic arch is of normal caliber with normal 3 vessel morphology. No high-grade stenosis seen at the origin of the great vessels. Subclavian arteries widely patent.  Right carotid system: Right common carotid artery is well opacified from its origin to the carotid bifurcation. Right internal carotid artery well opacified from the carotid bifurcation to the skullbase. No evidence for stenosis,  dissection, or occlusion.  Left carotid system: Left common carotid artery well opacified from its origin to the carotid bifurcation. Left internal carotid artery well opacified from the carotid bifurcation to the skullbase. No evidence for dissection, occlusion, or stenosis.  Vertebral arteries:Both vertebral arteries arise from the subclavian arteries. The left vertebral artery is dominant and widely patent to the skullbase. Diminutive right vertebral artery patent as well. No evidence for vertebral artery dissection or other acute abnormality.  Skeleton: No acute osseous abnormality identified. No worrisome lytic or blastic osseous lesions.  Other neck: No acute soft tissue abnormality within the neck. No adenopathy. Thyroid gland normal. Visualized superior mediastinum grossly within normal limits. Visualized lungs are clear.  CTA HEAD  Anterior circulation: The petrous, cavernous, and supra clinoid segments of the internal carotid arteries are widely patent bilaterally. A1 segments, anterior communicating artery, and anterior cerebral arteries well opacified.  M1 segments widely patent without stenosis or occlusion. MCA bifurcations normal. Distal MCA branches well opacified and symmetric bilaterally.  Posterior circulation: Left vertebral artery is dominant and widely patent to the vertebrobasilar junction. The diminutive right vertebral artery patent as well. Posterior inferior cerebral arteries well opacified. Basilar artery mildly tortuous but patent. Superior cerebellar arteries and posterior cerebral arteries widely patent bilaterally.  Venous sinuses: No abnormality identified within the venous sinuses.  Anatomic variants: No anatomic variant.  No aneurysm.  Delayed phase: No abnormal enhancement on delayed sequences.  IMPRESSION: Normal CTA of the head and neck.   Electronically Signed   By: Rise Mu M.D.   On: 04/20/2015 22:28     EKG Interpretation None      MDM   Final  diagnoses:  Headache, unspecified headache type   typical migraine headache that onset gradually this morning associated with nausea and photophobia. Did  hit head on a cabinet yesterday. No focal weakness, numbness or tingling.  CT head obtained given history of trauma.  Toradol, reglan, benadryl given.  IVF given.  Patient reports minimal improvement.  Still c/o L paraspinal neck pain which is abnormal for her migraines.  Will obtain CTA. Decadron, depakon, given.  Low suspicion for [facility] or meningitis.  CTA pending at time of sign out to Dr. Estell Harpin.  Glynn Octave, MD 04/20/15 2255

## 2015-06-10 ENCOUNTER — Encounter (HOSPITAL_COMMUNITY): Payer: Self-pay | Admitting: *Deleted

## 2015-06-10 ENCOUNTER — Emergency Department (HOSPITAL_COMMUNITY)
Admission: EM | Admit: 2015-06-10 | Discharge: 2015-06-11 | Disposition: A | Discharge status: Home / Self Care | Payer: Medicaid Other | Attending: Emergency Medicine | Admitting: Emergency Medicine

## 2015-06-10 DIAGNOSIS — F410 Panic disorder [episodic paroxysmal anxiety] without agoraphobia: Secondary | ICD-10-CM | POA: Insufficient documentation

## 2015-06-10 DIAGNOSIS — Z8679 Personal history of other diseases of the circulatory system: Secondary | ICD-10-CM | POA: Insufficient documentation

## 2015-06-10 DIAGNOSIS — Z8742 Personal history of other diseases of the female genital tract: Secondary | ICD-10-CM | POA: Diagnosis not present

## 2015-06-10 DIAGNOSIS — Z79899 Other long term (current) drug therapy: Secondary | ICD-10-CM | POA: Diagnosis not present

## 2015-06-10 DIAGNOSIS — J45909 Unspecified asthma, uncomplicated: Secondary | ICD-10-CM | POA: Insufficient documentation

## 2015-06-10 DIAGNOSIS — Z9049 Acquired absence of other specified parts of digestive tract: Secondary | ICD-10-CM | POA: Insufficient documentation

## 2015-06-10 DIAGNOSIS — R1032 Left lower quadrant pain: Secondary | ICD-10-CM | POA: Diagnosis not present

## 2015-06-10 DIAGNOSIS — R102 Pelvic and perineal pain: Secondary | ICD-10-CM | POA: Diagnosis not present

## 2015-06-10 DIAGNOSIS — Z90710 Acquired absence of both cervix and uterus: Secondary | ICD-10-CM | POA: Insufficient documentation

## 2015-06-10 DIAGNOSIS — Z8619 Personal history of other infectious and parasitic diseases: Secondary | ICD-10-CM | POA: Insufficient documentation

## 2015-06-10 DIAGNOSIS — F329 Major depressive disorder, single episode, unspecified: Secondary | ICD-10-CM | POA: Diagnosis not present

## 2015-06-10 DIAGNOSIS — Z9889 Other specified postprocedural states: Secondary | ICD-10-CM | POA: Diagnosis not present

## 2015-06-10 DIAGNOSIS — R1031 Right lower quadrant pain: Secondary | ICD-10-CM | POA: Diagnosis present

## 2015-06-10 DIAGNOSIS — R1030 Lower abdominal pain, unspecified: Secondary | ICD-10-CM

## 2015-06-10 DIAGNOSIS — Z720 Tobacco use: Secondary | ICD-10-CM | POA: Insufficient documentation

## 2015-06-10 LAB — COMPREHENSIVE METABOLIC PANEL
ALBUMIN: 3.8 g/dL (ref 3.5–5.0)
ALK PHOS: 71 U/L (ref 38–126)
ALT: 27 U/L (ref 14–54)
AST: 21 U/L (ref 15–41)
Anion gap: 10 (ref 5–15)
BILIRUBIN TOTAL: 0.5 mg/dL (ref 0.3–1.2)
BUN: 9 mg/dL (ref 6–20)
CO2: 26 mmol/L (ref 22–32)
Calcium: 8.9 mg/dL (ref 8.9–10.3)
Chloride: 102 mmol/L (ref 101–111)
Creatinine, Ser: 0.54 mg/dL (ref 0.44–1.00)
GFR calc Af Amer: >60 mL/min (ref >60)
GFR calc non Af Amer: >60 mL/min (ref >60)
Glucose, Bld: 97 mg/dL (ref 65–99)
Potassium: 3.9 mmol/L (ref 3.5–5.1)
Sodium: 138 mmol/L (ref 135–145)
Total Protein: 7.1 g/dL (ref 6.5–8.1)

## 2015-06-10 LAB — URINALYSIS, ROUTINE W REFLEX MICROSCOPIC
Bilirubin Urine: NEGATIVE
Glucose, UA: NEGATIVE mg/dL
Hgb urine dipstick: NEGATIVE
Ketones, ur: NEGATIVE mg/dL
Leukocytes, UA: NEGATIVE
Nitrite: NEGATIVE
Protein, ur: NEGATIVE mg/dL
Specific Gravity, Urine: 1.025 (ref 1.005–1.030)
Urobilinogen, UA: 0.2 mg/dL (ref 0.0–1.0)
pH: 6.5 (ref 5.0–8.0)

## 2015-06-10 LAB — CBC WITH DIFFERENTIAL/PLATELET
BASOS PCT: 1 % (ref 0–1)
Basophils Absolute: 0.1 10*3/uL (ref 0.0–0.1)
Eosinophils Absolute: 0.3 10*3/uL (ref 0.0–0.7)
Eosinophils Relative: 3 % (ref 0–5)
HEMATOCRIT: 37.3 % (ref 36.0–46.0)
Hemoglobin: 12.6 g/dL (ref 12.0–15.0)
LYMPHS ABS: 3.3 10*3/uL (ref 0.7–4.0)
Lymphocytes Relative: 35 % (ref 12–46)
MCH: 29 pg (ref 26.0–34.0)
MCHC: 33.8 g/dL (ref 30.0–36.0)
MCV: 85.7 fL (ref 78.0–100.0)
Monocytes Absolute: 0.5 10*3/uL (ref 0.1–1.0)
Monocytes Relative: 5 % (ref 3–12)
Neutro Abs: 5.4 10*3/uL (ref 1.7–7.7)
Neutrophils Relative %: 56 % (ref 43–77)
Platelets: 305 10*3/uL (ref 150–400)
RBC: 4.35 MIL/uL (ref 3.87–5.11)
RDW: 14.2 % (ref 11.5–15.5)
WBC: 9.6 10*3/uL (ref 4.0–10.5)

## 2015-06-10 LAB — LIPASE, BLOOD: LIPASE: 19 U/L — AB (ref 22–51)

## 2015-06-10 MED ORDER — KETOROLAC TROMETHAMINE 60 MG/2ML IM SOLN
60.0000 mg | Freq: Once | INTRAMUSCULAR | Status: AC
  Administered 2015-06-10: 60 mg via INTRAMUSCULAR
  Filled 2015-06-10: qty 2

## 2015-06-10 NOTE — ED Notes (Signed)
Pt had a partial hysterectomy in January. Pt states she is have pain and swelling to incision site. Pt was told to come here by [facility].

## 2015-06-10 NOTE — ED Notes (Signed)
   06/10/15 2209  Abdominal  Gastrointestinal (WDL) X  Abdomen Inspection Soft;Surgical scar;Obese  Tenderness Tender (right lower abdomen)  pt reports she has been have lower pelvic pain on & off since Friday. Pain has become constant described as a sharp stabbing pain. Denies any urinary symptoms.

## 2015-06-10 NOTE — ED Provider Notes (Signed)
CSN: [id number]     Arrival date & time 06/10/15  2039 History   First MD Initiated Contact with Patient 06/10/15 2229     Chief Complaint  Patient presents with  . Abdominal Pain     (Consider location/radiation/quality/duration/timing/severity/associated sxs/prior Treatment) Patient is a 34 y.o. female presenting with abdominal pain. The history is provided by the patient.  Abdominal Pain Pain location:  RLQ and LLQ Pain quality: sharp and stabbing   Pain radiates to:  Does not radiate Pain severity:  Severe Onset quality:  Gradual Duration:  3 days Timing:  Constant Progression:  Worsening Associated symptoms: no chills, no dysuria, no fever, no nausea, no shortness of breath, no vaginal bleeding, no vaginal discharge and no vomiting    Barbara Jennings is a 34 y.o. female who presents to the ED with pelvic pain that started 3 days ago. She reports that the pain started and then went away but today returned and has been constant and worsening.  She called her GYN office today and the answering service told her to come to the ED. She has had an appendectomy and hysterectomy. Her pain is located to the left of the incision site of her hysterectomy. Past Medical History  Diagnosis Date  . Asthma   . Anxiety   . Panic attack   . Depression   . Migraine   . Abnormal Pap smear   . HSV-2 (herpes simplex virus 2) infection   . Vaginal Pap smear, abnormal   . Unspecified symptom associated with female genital organs 01/27/2014  . 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 Other and unspecified ovarian cyst 02/06/2014  . Hx of migraines 02/09/2014    Has ?aura, will rx POP  . Vulvar abscess 03/09/2014    I&D 3/9 in ER at [facility] was rx'd bactrim ds x 10 days  . Abnormal uterine bleeding (AUB) 10/11/2014  . Dysmenorrhea 10/11/2014  . Thickened endometrium 10/18/2014  . Ovarian cyst 10/18/2014    Past Surgical History  Procedure Laterality Date  . Cesarean section    . Ovarian cyst removal    . Appendectomy    . Cholecystectomy    . Dilation and curettage of uterus    . Laparoscopic supracervical hysterectomy N/A 01/02/2015    Procedure: ATTEMPTED LAPAROSCOPIC SUPRACERVICAL HYSTERECTOMY CONVERTED TO OPEN AT 0973;  Surgeon: Tilda Burrow, MD;  Location: [facility];  Service: Gynecology;  Laterality: N/A;  . Supracervical abdominal hysterectomy N/A 01/02/2015    Procedure: HYSTERECTOMY SUPRACERVICAL ABDOMINAL;  Surgeon: Tilda Burrow, MD;  Location: [facility];  Service: Gynecology;  Laterality: N/A;  . Abdominal hysterectomy     Family History  Problem Relation Age of Onset  . Ulcers Father   . Cancer Maternal Aunt     breast   . Diabetes Maternal Grandmother   . Diabetes Maternal Grandfather   . Alzheimer's disease Paternal Grandmother   . Diabetes Maternal Aunt   . Cancer Paternal Aunt     cervical   History  Substance Use Topics  . Smoking status: Current Every Day Smoker -- 0.50 packs/day for 12 years    Types: Cigarettes  . Smokeless tobacco: Never Used  . Alcohol Use: Yes     Comment: socially   OB History    Gravida Para Term Preterm AB TAB SAB Ectopic Multiple Living   7 2 1 1  5  5   1     Review of Systems  Constitutional: Negative for fever and chills.  HENT: Negative.   Respiratory: Negative for chest tightness and shortness of breath.   Gastrointestinal: Positive for abdominal pain. Negative for nausea and vomiting.  Genitourinary: Positive for pelvic pain. Negative for dysuria, frequency, vaginal bleeding and vaginal discharge.  Musculoskeletal: Negative for myalgias and neck pain.  Skin: Negative for rash.  Neurological: Negative for dizziness and headaches.  Psychiatric/Behavioral: Negative for confusion. The patient is not nervous/anxious.       Allergies  Coconut flavor  Home Medications   Prior to Admission medications   Medication Sig  Start Date End Date Taking? Authorizing Provider  HYDROcodone-acetaminophen (NORCO/VICODIN) 5-325 MG per tablet Take 1-2 tablets by mouth every 6 (six) hours as needed for moderate pain.   Yes Historical Provider, MD  PARoxetine (PAXIL) 10 MG tablet Take 10 mg by mouth daily.   Yes Historical Provider, MD  ibuprofen (ADVIL,MOTRIN) 600 MG tablet Take 1 tablet (600 mg total) by mouth every 6 (six) hours as needed for moderate pain. Patient not taking: Reported on 04/20/2015 02/26/15   Tilda Burrow, MD  oxyCODONE-acetaminophen (PERCOCET/ROXICET) 5-325 MG per tablet Take 1-2 tablets by mouth every 4 (four) hours as needed for severe pain (moderate to severe pain (when tolerating fluids)). Patient not taking: Reported on 04/20/2015 02/26/15   Tilda Burrow, MD   BP 114/70 mmHg  Pulse 81  Temp(Src) 98.7 F (37.1 C)  Resp 20  Ht  (1.676 m)  Wt 245 lb (111.131 kg)  BMI 39.56 kg/m2  SpO2 100%  LMP  (LMP Unknown) Physical Exam  Constitutional: She is oriented to person, place, and time. She appears well-developed and well-nourished. No distress.  HENT:  Head: Normocephalic.  Eyes: EOM are normal.  Neck: Neck supple.  Cardiovascular: Normal rate.   Pulmonary/Chest: Effort normal.  Abdominal: Soft. There is tenderness in the left lower quadrant. There is no rebound and no guarding.  Healed incision lower abdomen s/p abdominal hysterectomy in Jan. Tender on palpation to the left of the incision. No redness, drainage or signs of infection.   Genitourinary:  External genitalia without lesions. Mucous d/c vaginal vault. There is a cervix visualized, left adnexal tenderness. Uterus absent.   Musculoskeletal: Normal range of motion.  Neurological: She is alert and oriented to person, place, and time. No cranial nerve deficit.  Skin: Skin is warm and dry.  Psychiatric: She has a normal mood and affect. Her behavior is normal.  Nursing note and vitals reviewed.   ED Course  Procedures  (including critical care time) Labs, CT scan Pain management with Toradol IM and Dilaudid IV, Zofran 4 mg IV Labs Review Results for orders placed or performed during the hospital encounter of 06/10/15 (from the past 24 hour(s))  Urinalysis, Routine w reflex microscopic (not at [facility])     Status: None   Collection Time: 06/10/15 10:15 PM  Result Value Ref Range   Color, Urine YELLOW YELLOW   APPearance CLEAR CLEAR   Specific Gravity, Urine 1.025 1.005 - 1.030   pH 6.5 5.0 - 8.0   Glucose, UA NEGATIVE NEGATIVE mg/dL   Hgb urine dipstick NEGATIVE NEGATIVE   Bilirubin Urine NEGATIVE NEGATIVE   Ketones, ur NEGATIVE NEGATIVE mg/dL   Protein, ur NEGATIVE NEGATIVE mg/dL   Urobilinogen, UA 0.2 0.0 - 1.0 mg/dL   Nitrite NEGATIVE NEGATIVE   Leukocytes, UA NEGATIVE NEGATIVE  CBC with Differential  Status: None   Collection Time: 06/10/15 11:08 PM  Result Value Ref Range   WBC 9.6 4.0 - 10.5 K/uL   RBC 4.35 3.87 - 5.11 MIL/uL   Hemoglobin 12.6 12.0 - 15.0 g/dL   HCT 60.4 54.0 - 98.1 %   MCV 85.7 78.0 - 100.0 fL   MCH 29.0 26.0 - 34.0 pg   MCHC 33.8 30.0 - 36.0 g/dL   RDW 19.1 47.8 - 29.5 %   Platelets 305 150 - 400 K/uL   Neutrophils Relative % 56 43 - 77 %   Neutro Abs 5.4 1.7 - 7.7 K/uL   Lymphocytes Relative 35 12 - 46 %   Lymphs Abs 3.3 0.7 - 4.0 K/uL   Monocytes Relative 5 3 - 12 %   Monocytes Absolute 0.5 0.1 - 1.0 K/uL   Eosinophils Relative 3 0 - 5 %   Eosinophils Absolute 0.3 0.0 - 0.7 K/uL   Basophils Relative 1 0 - 1 %   Basophils Absolute 0.1 0.0 - 0.1 K/uL  Comprehensive metabolic panel     Status: None   Collection Time: 06/10/15 11:08 PM  Result Value Ref Range   Sodium 138 135 - 145 mmol/L   Potassium 3.9 3.5 - 5.1 mmol/L   Chloride 102 101 - 111 mmol/L   CO2 26 22 - 32 mmol/L   Glucose, Bld 97 65 - 99 mg/dL   BUN 9 6 - 20 mg/dL   Creatinine, Ser 6.21 0.44 - 1.00 mg/dL   Calcium 8.9 8.9 - 30.8 mg/dL   Total Protein 7.1 6.5 - 8.1 g/dL   Albumin 3.8 3.5 - 5.0  g/dL   AST 21 15 - 41 U/L   ALT 27 14 - 54 U/L   Alkaline Phosphatase 71 38 - 126 U/L   Total Bilirubin 0.5 0.3 - 1.2 mg/dL   GFR calc non Af Amer >60 >60 mL/min   GFR calc Af Amer >60 >60 mL/min   Anion gap 10 5 - 15  Lipase, blood     Status: Abnormal   Collection Time: 06/10/15 11:08 PM  Result Value Ref Range   Lipase 19 (L) 22 - 51 U/L      MDM   Dr. Wilkie Aye assumes care of the patient. Patient in CT.       [address] [place], NP 06/11/15 6578  Shon Baton, MD 06/11/15 [phone]

## 2015-06-11 ENCOUNTER — Encounter (HOSPITAL_COMMUNITY): Payer: Self-pay | Admitting: Radiology

## 2015-06-11 ENCOUNTER — Emergency Department (HOSPITAL_COMMUNITY): Payer: Medicaid Other

## 2015-06-11 ENCOUNTER — Encounter: Payer: Self-pay | Admitting: Obstetrics and Gynecology

## 2015-06-11 ENCOUNTER — Ambulatory Visit (INDEPENDENT_AMBULATORY_CARE_PROVIDER_SITE_OTHER): Payer: Medicaid Other | Admitting: Obstetrics and Gynecology

## 2015-06-11 VITALS — BP 128/66 | HR 68 | Wt 292.4 lb

## 2015-06-11 DIAGNOSIS — R109 Unspecified abdominal pain: Secondary | ICD-10-CM

## 2015-06-11 MED ORDER — HYDROMORPHONE HCL 1 MG/ML IJ SOLN
1.0000 mg | Freq: Once | INTRAMUSCULAR | Status: AC
  Administered 2015-06-11: 1 mg via INTRAVENOUS
  Filled 2015-06-11: qty 1

## 2015-06-11 MED ORDER — OXYCODONE-ACETAMINOPHEN 5-325 MG PO TABS: 1.0000 | ORAL_TABLET | Freq: Four times a day (QID) | ORAL | Status: DC | PRN

## 2015-06-11 MED ORDER — IOHEXOL 300 MG/ML  SOLN
100.0000 mL | Freq: Once | INTRAMUSCULAR | Status: AC | PRN
  Administered 2015-06-11: 100 mL via INTRAVENOUS

## 2015-06-11 MED ORDER — ONDANSETRON HCL 4 MG/2ML IJ SOLN
4.0000 mg | Freq: Once | INTRAMUSCULAR | Status: AC
  Administered 2015-06-11: 4 mg via INTRAVENOUS
  Filled 2015-06-11: qty 2

## 2015-06-11 NOTE — Progress Notes (Signed)
Patient ID: Barbara Jennings, female   DOB: March 18, 1981, 34 y.o.   MRN: [id number]    [facility] ObGyn Clinic Visit  Patient name: Barbara Jennings MRN [id number]  Date of birth: Apr 21, 1981  CC & HPI:  Barbara Jennings is a 34 y.o. female s/p supracervical hysterectomy presenting today for intermittent, continued pain over her incision site that started 3 days ago. Pt was seen in the ED last night for the same and had an CT Scan of her abdomen which showed possible scar tissue and a small umbilical hernia. Pt denies a history of similar pain. She also denies nausea and vomiting.  ROS:  A complete 10 system review of systems was obtained and all systems are negative except as noted in the HPI and PMH.   Pertinent History Reviewed:   Reviewed: Significant for supracervical hysterectomy Medical         Past Medical History  Diagnosis Date  . Asthma   . Anxiety   . Panic attack   . Depression   . Migraine   . Abnormal Pap smear   . HSV-2 (herpes simplex virus 2) infection   . Vaginal Pap smear, abnormal   . Unspecified symptom associated with female genital organs 01/27/2014  . 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 Other and unspecified ovarian cyst 02/06/2014  . Hx of migraines 02/09/2014    Has ?aura, will rx POP  . Vulvar abscess 03/09/2014    I&D 3/9 in ER at [facility] was rx'd bactrim ds x 10 days  . Abnormal uterine bleeding (AUB) 10/11/2014  . Dysmenorrhea 10/11/2014  . Thickened endometrium 10/18/2014  . Ovarian cyst 10/18/2014                              Surgical Hx:    Past Surgical History  Procedure Laterality Date  . Cesarean section    . Ovarian cyst removal    . Appendectomy    . Cholecystectomy    . Dilation and curettage of uterus    . Laparoscopic supracervical hysterectomy N/A 01/02/2015    Procedure: ATTEMPTED LAPAROSCOPIC SUPRACERVICAL HYSTERECTOMY CONVERTED TO OPEN AT  8115;  Surgeon: Tilda Burrow, MD;  Location: [facility];  Service: Gynecology;  Laterality: N/A;  . Supracervical abdominal hysterectomy N/A 01/02/2015    Procedure: HYSTERECTOMY SUPRACERVICAL ABDOMINAL;  Surgeon: Tilda Burrow, MD;  Location: [facility];  Service: Gynecology;  Laterality: N/A;  . Abdominal hysterectomy     Medications: Reviewed & Updated - see associated section                       Current outpatient prescriptions:  .  HYDROcodone-acetaminophen (NORCO/VICODIN) 5-325 MG per tablet, Take 1-2 tablets by mouth every 6 (six) hours as needed for moderate pain., Disp: , Rfl:  .  PARoxetine (PAXIL) 10 MG tablet, Take 10 mg by mouth daily., Disp: , Rfl:  .  ibuprofen (ADVIL,MOTRIN) 600 MG tablet, Take 1 tablet (600 mg total) by mouth every 6 (six) hours as needed for moderate pain. (Patient not taking: Reported on 04/20/2015), Disp: 60 tablet, Rfl: 1 .  oxyCODONE-acetaminophen (PERCOCET/ROXICET) 5-325 MG per tablet, Take 1-2 tablets by mouth every 6 (six) hours as needed for severe pain. (Patient not taking: Reported on 06/11/2015), Disp: 10 tablet, Rfl: 0 .  [DISCONTINUED]  citalopram (CELEXA) 20 MG tablet, Take 20 mg by mouth daily.  , Disp: , Rfl:    Social History: Reviewed -  reports that she has been smoking Cigarettes.  She has a 6 pack-year smoking history. She has never used smokeless tobacco.  Objective Findings:  Vitals: Blood pressure 128/66, pulse 68, weight 292 lb 6.4 oz (132.632 kg).  Physical Examination: General appearance - alert, well appearing, and in no distress and oriented to person, place, and time Mental status - alert, oriented to person, place, and time, normal mood, behavior, speech, dress, motor activity, and thought processes Abdomen - soft, nontender, nondistended, no masses or organomegaly tenderness noted deep in abd wall at area of symmetric firmness deep in abd wall.   Assessment & Plan:   A:  1. Pain over the incision site without evidence of  hernia 2. Will follow over time to rule out endometrioma, or other rare sequellae of hyst. 3 assymptomatic 1 cm umbilical hernia 4. Morbid obesity P:  1. Will write note for out of  work for 1 week   This chart was scribed for Tilda Burrow, MD by Gwenyth Ober, ED Scribe. This patient was seen in room 1 and the patient's care was started at 4:32 PM.   I personally performed the services described in this documentation, which was SCRIBED in my presence. The recorded information has been reviewed and considered accurate. It has been edited as necessary during review. Tilda Burrow, MD

## 2015-06-11 NOTE — ED Notes (Signed)
Pt given a coke 

## 2015-06-11 NOTE — Discharge Instructions (Signed)
You were seen today for abdominal pain. Your lab work and CT scan is reassuring. You need follow-up with your GYN surgeon.  Abdominal Pain, Women Abdominal (stomach, pelvic, or belly) pain can be caused by many things. It is important to tell your doctor:  The location of the pain.  Does it come and go or is it present all the time?  Are there things that start the pain (eating certain foods, exercise)?  Are there other symptoms associated with the pain (fever, nausea, vomiting, diarrhea)? All of this is helpful to know when trying to find the cause of the pain. CAUSES   Stomach: virus or bacteria infection, or ulcer.  Intestine: appendicitis (inflamed appendix), regional ileitis (Crohn's disease), ulcerative colitis (inflamed colon), irritable bowel syndrome, diverticulitis (inflamed diverticulum of the colon), or cancer of the stomach or intestine.  Gallbladder disease or stones in the gallbladder.  Kidney disease, kidney stones, or infection.  Pancreas infection or cancer.  Fibromyalgia (pain disorder).  Diseases of the female organs:  Uterus: fibroid (non-cancerous) tumors or infection.  Fallopian tubes: infection or tubal pregnancy.  Ovary: cysts or tumors.  Pelvic adhesions (scar tissue).  Endometriosis (uterus lining tissue growing in the pelvis and on the pelvic organs).  Pelvic congestion syndrome (female organs filling up with blood just before the menstrual period).  Pain with the menstrual period.  Pain with ovulation (producing an egg).  Pain with an IUD (intrauterine device, birth control) in the uterus.  Cancer of the female organs.  Functional pain (pain not caused by a disease, may improve without treatment).  Psychological pain.  Depression. DIAGNOSIS  Your doctor will decide the seriousness of your pain by doing an examination.  Blood tests.  X-rays.  Ultrasound.  CT scan (computed tomography, special type of X-ray).  MRI (magnetic  resonance imaging).  Cultures, for infection.  Barium enema (dye inserted in the large intestine, to better view it with X-rays).  Colonoscopy (looking in intestine with a lighted tube).  Laparoscopy (minor surgery, looking in abdomen with a lighted tube).  Major abdominal exploratory surgery (looking in abdomen with a large incision). TREATMENT  The treatment will depend on the cause of the pain.   Many cases can be observed and treated at home.  Over-the-counter medicines recommended by your caregiver.  Prescription medicine.  Antibiotics, for infection.  Birth control pills, for painful periods or for ovulation pain.  Hormone treatment, for endometriosis.  Nerve blocking injections.  Physical therapy.  Antidepressants.  Counseling with a psychologist or psychiatrist.  Minor or major surgery. HOME CARE INSTRUCTIONS   Do not take laxatives, unless directed by your caregiver.  Take over-the-counter pain medicine only if ordered by your caregiver. Do not take aspirin because it can cause an upset stomach or bleeding.  Try a clear liquid diet (broth or water) as ordered by your caregiver. Slowly move to a bland diet, as tolerated, if the pain is related to the stomach or intestine.  Have a thermometer and take your temperature several times a day, and record it.  Bed rest and sleep, if it helps the pain.  Avoid sexual intercourse, if it causes pain.  Avoid stressful situations.  Keep your follow-up appointments and tests, as your caregiver orders.  If the pain does not go away with medicine or surgery, you may try:  Acupuncture.  Relaxation exercises (yoga, meditation).  Group therapy.  Counseling. SEEK MEDICAL CARE IF:   You notice certain foods cause stomach pain.  Your home care  treatment is not helping your pain.  You need stronger pain medicine.  You want your IUD removed.  You feel faint or lightheaded.  You develop nausea and  vomiting.  You develop a rash.  You are having side effects or an allergy to your medicine. SEEK IMMEDIATE MEDICAL CARE IF:   Your pain does not go away or gets worse.  You have a fever.  Your pain is felt only in portions of the abdomen. The right side could possibly be appendicitis. The left lower portion of the abdomen could be colitis or diverticulitis.  You are passing blood in your stools (bright red or black tarry stools, with or without vomiting).  You have blood in your urine.  You develop chills, with or without a fever.  You pass out. MAKE SURE YOU:   Understand these instructions.  Will watch your condition.  Will get help right away if you are not doing well or get worse. Document Released: 10/12/2007 Document Revised: 05/01/2014 Document Reviewed: 11/01/2009 [facility] Patient Information 2015 [place], [place]. This information is not intended to replace advice given to you by your health care provider. Make sure you discuss any questions you have with your health care provider.

## 2015-06-18 ENCOUNTER — Encounter: Payer: Self-pay | Admitting: Obstetrics and Gynecology

## 2015-06-18 ENCOUNTER — Ambulatory Visit (INDEPENDENT_AMBULATORY_CARE_PROVIDER_SITE_OTHER): Payer: Medicaid Other | Admitting: Obstetrics and Gynecology

## 2015-06-18 VITALS — BP 124/80 | Ht 66.0 in | Wt 292.0 lb

## 2015-06-18 DIAGNOSIS — L905 Scar conditions and fibrosis of skin: Secondary | ICD-10-CM | POA: Diagnosis not present

## 2015-06-18 DIAGNOSIS — R208 Other disturbances of skin sensation: Secondary | ICD-10-CM | POA: Diagnosis not present

## 2015-06-18 DIAGNOSIS — L7682 Other postprocedural complications of skin and subcutaneous tissue: Secondary | ICD-10-CM

## 2015-06-18 MED ORDER — HYDROCODONE-ACETAMINOPHEN 5-325 MG PO TABS: 1.0000 | ORAL_TABLET | Freq: Four times a day (QID) | ORAL | Status: DC | PRN

## 2015-06-18 NOTE — Progress Notes (Signed)
Patient ID: Barbara Jennings, female   DOB: 1981-06-16, 34 y.o.   MRN: [id number] Pt here today for follow up. Pt states that she is here today for an incision check. Pt states that she is having more pain on the left side of her incision but it eventually radiates across entire incision.

## 2015-06-18 NOTE — Progress Notes (Signed)
Patient ID: Barbara Jennings, female   DOB: [birth year], 34 y.o.   MRN: [id number]   [facility] Ob[facility] Clinic Visit  Patient name: Barbara Jennings MRN [id number]  Date of birth: 11-27-1981  CC & HPI:  Barbara Jennings is a 34 y.o. female presenting today for severe continuous pain in her suprapubic abdominal wall over the incision site made during her abdominal hysterectomy. Her pain has been constant and has worsened since the surgery performed by me about 6 months ago, on 01/02/15. Her pain is increased by touch and increased by physical activity. She states that nothing makes it better. Lying down, sitting, picking up objects, and staying still all do not affect it.   Trigger point injection is offered, accepted, and performed in left end of the old cesarean/hysterectomy scar, and 20 cc of 1% local anesthesia injected in to the fascia and overlying fatty tisses, with results being that the pain becomes significantly less. CT abdomen is reviewed screen by screen, no hernia or defect or specific mass in the area of concern in abd wall is identifiable. ROS:  A complete 10 system review of systems was obtained and all systems are negative except as noted in the HPI and PMH.   No dietary component to pain. The pain is worsened by movement , lifting ,or sitting up.   Pertinent History Reviewed:   Reviewed: Significant for Cesarean section, ovarian cyst removal, D&C of uterus, abdominal hysterectomy Medical         Past Medical History  Diagnosis Date  . Asthma   . Anxiety   . Panic attack   . Depression   . Migraine   . Abnormal Pap smear   . HSV-2 (herpes simplex virus 2) infection   . Vaginal Pap smear, abnormal   . Unspecified symptom associated with female genital organs 01/27/2014  . 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 Other and unspecified ovarian cyst 02/06/2014  . Hx of migraines 02/09/2014    Has  ?aura, will rx POP  . Vulvar abscess 03/09/2014    I&D 3/9 in ER at [facility] was rx'd bactrim ds x 10 days  . Abnormal uterine bleeding (AUB) 10/11/2014  . Dysmenorrhea 10/11/2014  . Thickened endometrium 10/18/2014  . Ovarian cyst 10/18/2014                              Surgical Hx:    Past Surgical History  Procedure Laterality Date  . Cesarean section    . Ovarian cyst removal    . Appendectomy    . Cholecystectomy    . Dilation and curettage of uterus    . Laparoscopic supracervical hysterectomy N/A 01/02/2015    Procedure: ATTEMPTED LAPAROSCOPIC SUPRACERVICAL HYSTERECTOMY CONVERTED TO OPEN AT 8372;  Surgeon: Tilda Burrow, MD;  Location: [facility];  Service: Gynecology;  Laterality: N/A;  . Supracervical abdominal hysterectomy N/A 01/02/2015    Procedure: HYSTERECTOMY SUPRACERVICAL ABDOMINAL;  Surgeon: Tilda Burrow, MD;  Location: [facility];  Service: Gynecology;  Laterality: N/A;  . Abdominal hysterectomy     Medications: Reviewed & Updated - see associated section                       Current outpatient prescriptions:  .  ibuprofen (ADVIL,MOTRIN) 600 MG tablet, Take 1 tablet (600  mg total) by mouth every 6 (six) hours as needed for moderate pain., Disp: 60 tablet, Rfl: 1 .  PARoxetine (PAXIL) 10 MG tablet, Take 10 mg by mouth daily., Disp: , Rfl:  .  HYDROcodone-acetaminophen (NORCO/VICODIN) 5-325 MG per tablet, Take 1-2 tablets by mouth every 6 (six) hours as needed for moderate pain., Disp: , Rfl:  .  [DISCONTINUED] citalopram (CELEXA) 20 MG tablet, Take 20 mg by mouth daily.  , Disp: , Rfl:    Social History: Reviewed -  reports that she has been smoking Cigarettes.  She has a 6 pack-year smoking history. She has never used smokeless tobacco.  Objective Findings:  Vitals: Blood pressure 124/80, height  (1.676 m), weight 292 lb (132.45 kg).  Physical Examination: General appearance - alert, well appearing, and in no distress, oriented to person, place, and time and  overweight Mental status - alert, oriented to person, place, and time, normal mood, behavior, speech, dress, motor activity, and thought processes, affect appropriate to mood Abdomen - soft abdomen, tenderness just superior and even with the left end of the old C-section/hysterectomy scar. No mass. Normal skin   Trigger Point Injection Procedure by Dr. Emelda Fear 9:53 AM - 20 cc's of 1% Lidocaine injected over incision site. 10:21 AM - Local area injection of fascia leads to improved sensation- "A lot better than it was."   Assessment & Plan:   A:  1. 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 Confirmed by trigger point injection performed in-office. Review of CT scan rules out hernia.  P:  1. Consider surgical excision of old scar, scheduled for 1 week from now.     This chart was SCRIBED for Barbara Bach, MD by Ronney Lion, ED Scribe. This patient was seen in room 2 and the patient's care was started at 9:35 AM.  I personally performed the services described in this documentation, which was SCRIBED in my presence. The recorded information has been reviewed and considered accurate. It has been edited as necessary during review. Tilda Burrow, MD

## 2015-06-20 NOTE — Patient Instructions (Addendum)
      Barbara Jennings  06/20/2015     @PREFPERIOPPHARMACY @   Your procedure is scheduled on 06/26/2015  Report to [facility] at  725  A.M.  Call this number if you have problems the morning of surgery:  [phone]   Remember:  Do not eat food or drink liquids after midnight.  Take these medicines the morning of surgery with A SIP OF WATER  Paxil, hydrocodone.   Do not wear jewelry, make-up or nail polish.  Do not wear lotions, powders, or perfumes.    Do not shave 48 hours prior to surgery.  Men may shave face and neck.  Do not bring valuables to the hospital.  [facility] is not responsible for any belongings or valuables.  Contacts, dentures or bridgework may not be worn into surgery.  Leave your suitcase in the car.  After surgery it may be brought to your room.  For patients admitted to the hospital, discharge time will be determined by your treatment team.  Patients discharged the day of surgery will not be allowed to drive home.   Name and phone number of your driver:   family Special instructions:  none  Please read over the following fact sheets that you were given. Pain Booklet, Coughing and Deep Breathing, Surgical Site Infection Prevention, Anesthesia Post-op Instructions and Care and Recovery After Surgery      PATIENT INSTRUCTIONS POST-ANESTHESIA  IMMEDIATELY FOLLOWING SURGERY:  Do not drive or operate machinery for the first twenty four hours after surgery.  Do not make any important decisions for twenty four hours after surgery or while taking narcotic pain medications or sedatives.  If you develop intractable nausea and vomiting or a severe headache please notify your doctor immediately.  FOLLOW-UP:  Please make an appointment with your surgeon as instructed. You do not need to follow up with anesthesia unless specifically instructed to do so.  WOUND CARE INSTRUCTIONS (if applicable):  Keep a dry clean dressing on the anesthesia/puncture wound site if there  is drainage.  Once the wound has quit draining you may leave it open to air.  Generally you should leave the bandage intact for twenty four hours unless there is drainage.  If the epidural site drains for more than 36-48 hours please call the anesthesia department.  QUESTIONS?:  Please feel free to call your physician or the hospital operator if you have any questions, and they will be happy to assist you.

## 2015-06-21 ENCOUNTER — Encounter (HOSPITAL_COMMUNITY)
Admission: RE | Admit: 2015-06-21 | Discharge: 2015-06-21 | Disposition: A | Discharge status: Home / Self Care | Payer: Medicaid Other | Source: Ambulatory Visit | Attending: Obstetrics and Gynecology | Admitting: Obstetrics and Gynecology

## 2015-06-21 ENCOUNTER — Other Ambulatory Visit: Payer: Self-pay | Admitting: Obstetrics and Gynecology

## 2015-06-21 ENCOUNTER — Encounter (HOSPITAL_COMMUNITY): Payer: Self-pay

## 2015-06-21 DIAGNOSIS — L905 Scar conditions and fibrosis of skin: Secondary | ICD-10-CM | POA: Insufficient documentation

## 2015-06-21 DIAGNOSIS — Z01812 Encounter for preprocedural laboratory examination: Secondary | ICD-10-CM | POA: Diagnosis not present

## 2015-06-21 LAB — URINE MICROSCOPIC-ADD ON

## 2015-06-21 LAB — TYPE AND SCREEN
ABO/RH(D): A POS
Antibody Screen: NEGATIVE

## 2015-06-21 LAB — URINALYSIS, ROUTINE W REFLEX MICROSCOPIC
BILIRUBIN URINE: NEGATIVE
GLUCOSE, UA: NEGATIVE mg/dL
Hgb urine dipstick: NEGATIVE
Ketones, ur: NEGATIVE mg/dL
Nitrite: NEGATIVE
Protein, ur: NEGATIVE mg/dL
Specific Gravity, Urine: >1.03 — ABNORMAL HIGH (ref 1.005–1.030)
UROBILINOGEN UA: 0.2 mg/dL (ref 0.0–1.0)
pH: 5.5 (ref 5.0–8.0)

## 2015-06-21 LAB — CBC
HCT: 37.3 % (ref 36.0–46.0)
Hemoglobin: 12.6 g/dL (ref 12.0–15.0)
MCH: 28.8 pg (ref 26.0–34.0)
MCHC: 33.8 g/dL (ref 30.0–36.0)
MCV: 85.4 fL (ref 78.0–100.0)
PLATELETS: 317 10*3/uL (ref 150–400)
RBC: 4.37 MIL/uL (ref 3.87–5.11)
RDW: 13.9 % (ref 11.5–15.5)
WBC: 8.1 10*3/uL (ref 4.0–10.5)

## 2015-06-21 LAB — BASIC METABOLIC PANEL
Anion gap: 9 (ref 5–15)
BUN: 5 mg/dL — AB (ref 6–20)
CALCIUM: 8.9 mg/dL (ref 8.9–10.3)
CO2: 23 mmol/L (ref 22–32)
Chloride: 105 mmol/L (ref 101–111)
Creatinine, Ser: 0.54 mg/dL (ref 0.44–1.00)
GLUCOSE: 89 mg/dL (ref 65–99)
Potassium: 4.2 mmol/L (ref 3.5–5.1)
Sodium: 137 mmol/L (ref 135–145)

## 2015-06-21 NOTE — H&P (Signed)
Progress Notes    Expand All Collapse All   Patient ID: Barbara Parodyracy M Eldred, female DOB: 08/01/1981, 34 y.o. MRN: [id number]  [facility]  Patient name: Barbara Russelracy M TilleyMRN [id number] Date of birth: 02/10/1981  CC & HPI:  Barbara Jennings is a 34 y.o. female presenting today for severe continuous pain in her suprapubic abdominal wall over the incision site made during her abdominal hysterectomy. Her pain has been constant and has worsened since the surgery performed by me about 6 months ago, on 01/02/15. Her pain is increased by touch and increased by physical activity. She states that nothing makes it better. Lying down, sitting, picking up objects, and staying still all do not affect it.  Trigger point injection is offered, accepted, and performed in left end of the old cesarean/hysterectomy scar, and 20 cc of 1% local anesthesia injected in to the fascia and overlying fatty tisses, with results being that the pain becomes significantly less. CT abdomen is reviewed screen by screen, no hernia or defect or specific mass in the area of concern in abd wall is identifiable. ROS:  A complete 10 system review of systems was obtained and all systems are negative except as noted in the HPI and PMH. No dietary component to pain. The pain is worsened by movement , lifting ,or sitting up.   Pertinent History Reviewed:  Reviewed: Significant for Cesarean section, ovarian cyst removal, D&C of uterus, abdominal hysterectomy Medical  Past Medical History  Diagnosis Date  . Asthma   . Anxiety   . Panic attack   . Depression   . Migraine   . Abnormal Pap smear   . HSV-2 (herpes simplex virus 2) infection   . Vaginal Pap smear, abnormal   . Unspecified symptom associated with female genital organs 01/27/2014  . Abnormal cervical Papanicolaou smear with positive human papilloma virus (HPV) DNA test 02/02/2014    Had ascus with  +HPV will get colpo  . Other and unspecified ovarian cyst 02/06/2014  . Hx of migraines 02/09/2014    Has ?aura, will rx POP  . Vulvar abscess 03/09/2014    [facility] bactrim ds x 10 days  . Abnormal uterine bleeding (AUB) 10/11/2014  . Dysmenorrhea 10/11/2014  . Thickened endometrium 10/18/2014  . Ovarian cyst 10/18/2014    Surgical Hx:  Past Surgical History  Procedure Laterality Date  . Cesarean section    . Ovarian cyst removal    . Appendectomy    . Cholecystectomy    . Dilation and curettage of uterus    . Laparoscopic supracervical hysterectomy N/A 01/02/2015    Procedure: ATTEMPTED LAPAROSCOPIC SUPRACERVICAL HYSTERECTOMY CONVERTED TO OPEN AT [id number]; Surgeon: Tilda BurrowJohn Sanuel Ladnier V, MD; Location: [facility]; Service: Gynecology; Laterality: N/A;  . Supracervical abdominal hysterectomy N/A 01/02/2015    Procedure: HYSTERECTOMY SUPRACERVICAL ABDOMINAL; Surgeon: Tilda BurrowJohn Allisen Pidgeon V, MD; Location: [facility]; Service: Gynecology; Laterality: N/A;  . Abdominal hysterectomy     Medications: Reviewed & Updated - see associated section   Current outpatient prescriptions:  . ibuprofen (ADVIL,MOTRIN) 600 MG tablet, Take 1 tablet (600 mg total) by mouth every 6 (six) hours as needed for moderate pain., Disp: 60 tablet, Rfl: 1 . PARoxetine (PAXIL) 10 MG tablet, Take 10 mg by mouth daily., Disp: , Rfl:  . HYDROcodone-acetaminophen (NORCO/VICODIN) 5-325 MG per tablet, Take 1-2 tablets by mouth every 6 (six) hours as needed for moderate pain., Disp: , Rfl:  . [DISCONTINUED] citalopram (CELEXA) 20  MG tablet, Take 20 mg by mouth daily. , Disp: , Rfl:    Social History: Reviewed -  reports that she has been smoking Cigarettes. She has a 6 pack-year smoking history. She has never used smokeless tobacco.  Objective Findings:  Vitals: Blood pressure 124/80,  height 5\' 6"  (1.676 m), weight 292 lb (132.45 kg).  Physical Examination: General appearance - alert, well appearing, and in no distress, oriented to person, place, and time and overweight Mental status - alert, oriented to person, place, and time, normal mood, behavior, speech, dress, motor activity, and thought processes, affect appropriate to mood Abdomen - soft abdomen, tenderness just superior and even with the left end of the old C-section/hysterectomy scar. No mass. Normal skin   Trigger Point Injection Procedure by Dr. Emelda Fear 9:53 AM - 20 cc's of 1% Lidocaine injected over incision site. 10:21 AM - Local area injection of fascia leads to improved sensation- "A lot better than it was."   Assessment & Plan:   A:  1. 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 Confirmed by trigger point injection performed in-office. Review of CT scan rules out hernia.  P:  1. Consider surgical excision of old scar, scheduled for 1 week from now.    This chart was SCRIBED for [name], MD by Ronney Lion, ED Scribe. This patient was seen in room 2 and the patient's care was started at 9:35 AM.  I personally performed the services described in this documentation, which was SCRIBED in my presence. The recorded information has been reviewed and considered accurate. It has been edited as necessary during review. Tilda Burrow, MD

## 2015-06-26 ENCOUNTER — Ambulatory Visit (HOSPITAL_COMMUNITY): Payer: Medicaid Other | Admitting: Anesthesiology

## 2015-06-26 ENCOUNTER — Ambulatory Visit (HOSPITAL_COMMUNITY)
Admission: RE | Admit: 2015-06-26 | Discharge: 2015-06-26 | Disposition: A | Discharge status: Home / Self Care | Payer: Medicaid Other | Source: Ambulatory Visit | Attending: Obstetrics and Gynecology | Admitting: Obstetrics and Gynecology

## 2015-06-26 ENCOUNTER — Encounter (HOSPITAL_COMMUNITY)
Admission: RE | Disposition: A | Discharge status: Home / Self Care | Payer: Self-pay | Source: Ambulatory Visit | Attending: Obstetrics and Gynecology

## 2015-06-26 ENCOUNTER — Encounter (HOSPITAL_COMMUNITY): Payer: Self-pay | Admitting: *Deleted

## 2015-06-26 DIAGNOSIS — F419 Anxiety disorder, unspecified: Secondary | ICD-10-CM | POA: Diagnosis not present

## 2015-06-26 DIAGNOSIS — J45909 Unspecified asthma, uncomplicated: Secondary | ICD-10-CM | POA: Diagnosis not present

## 2015-06-26 DIAGNOSIS — G43909 Migraine, unspecified, not intractable, without status migrainosus: Secondary | ICD-10-CM | POA: Insufficient documentation

## 2015-06-26 DIAGNOSIS — L905 Scar conditions and fibrosis of skin: Secondary | ICD-10-CM

## 2015-06-26 DIAGNOSIS — G589 Mononeuropathy, unspecified: Secondary | ICD-10-CM

## 2015-06-26 DIAGNOSIS — F329 Major depressive disorder, single episode, unspecified: Secondary | ICD-10-CM | POA: Diagnosis not present

## 2015-06-26 DIAGNOSIS — F410 Panic disorder [episodic paroxysmal anxiety] without agoraphobia: Secondary | ICD-10-CM | POA: Insufficient documentation

## 2015-06-26 DIAGNOSIS — Z90710 Acquired absence of both cervix and uterus: Secondary | ICD-10-CM | POA: Diagnosis not present

## 2015-06-26 DIAGNOSIS — M792 Neuralgia and neuritis, unspecified: Secondary | ICD-10-CM | POA: Diagnosis not present

## 2015-06-26 DIAGNOSIS — L7682 Other postprocedural complications of skin and subcutaneous tissue: Secondary | ICD-10-CM

## 2015-06-26 HISTORY — PX: SCAR REVISION: SHX5285

## 2015-06-26 SURGERY — REVISION, SCAR
Anesthesia: General | Site: Abdomen

## 2015-06-26 MED ORDER — HYDROCODONE-ACETAMINOPHEN 5-325 MG PO TABS: 1.0000 | ORAL_TABLET | Freq: Four times a day (QID) | ORAL | Status: DC | PRN

## 2015-06-26 MED ORDER — FENTANYL CITRATE (PF) 100 MCG/2ML IJ SOLN
INTRAMUSCULAR | Status: AC
  Filled 2015-06-26: qty 2

## 2015-06-26 MED ORDER — ROCURONIUM BROMIDE 100 MG/10ML IV SOLN
INTRAVENOUS | Status: DC | PRN
  Administered 2015-06-26: 40 mg via INTRAVENOUS

## 2015-06-26 MED ORDER — FENTANYL CITRATE (PF) 100 MCG/2ML IJ SOLN
25.0000 ug | INTRAMUSCULAR | Status: DC | PRN
  Administered 2015-06-26 (×2): 50 ug via INTRAVENOUS

## 2015-06-26 MED ORDER — MIDAZOLAM HCL 2 MG/2ML IJ SOLN
INTRAMUSCULAR | Status: AC
  Filled 2015-06-26: qty 2

## 2015-06-26 MED ORDER — BUPIVACAINE HCL (PF) 0.5 % IJ SOLN
INTRAMUSCULAR | Status: DC | PRN
  Administered 2015-06-26: 30 mL

## 2015-06-26 MED ORDER — ONDANSETRON HCL 4 MG/2ML IJ SOLN
4.0000 mg | Freq: Once | INTRAMUSCULAR | Status: AC
  Administered 2015-06-26: 4 mg via INTRAVENOUS

## 2015-06-26 MED ORDER — PROPOFOL 10 MG/ML IV BOLUS
INTRAVENOUS | Status: AC
  Filled 2015-06-26: qty 20

## 2015-06-26 MED ORDER — FENTANYL CITRATE (PF) 100 MCG/2ML IJ SOLN
INTRAMUSCULAR | Status: DC | PRN
  Administered 2015-06-26: 100 ug via INTRAVENOUS
  Administered 2015-06-26: 50 ug via INTRAVENOUS
  Administered 2015-06-26 (×2): 100 ug via INTRAVENOUS

## 2015-06-26 MED ORDER — GLYCOPYRROLATE 0.2 MG/ML IJ SOLN
INTRAMUSCULAR | Status: AC
  Filled 2015-06-26: qty 3

## 2015-06-26 MED ORDER — NEOSTIGMINE METHYLSULFATE 10 MG/10ML IV SOLN
INTRAVENOUS | Status: AC
  Filled 2015-06-26: qty 1

## 2015-06-26 MED ORDER — MIDAZOLAM HCL 2 MG/2ML IJ SOLN
1.0000 mg | INTRAMUSCULAR | Status: DC | PRN
  Administered 2015-06-26 (×2): 2 mg via INTRAVENOUS
  Filled 2015-06-26: qty 2

## 2015-06-26 MED ORDER — CEPHALEXIN 500 MG PO CAPS: 500.0000 mg | ORAL_CAPSULE | Freq: Four times a day (QID) | ORAL | Status: DC

## 2015-06-26 MED ORDER — CEFAZOLIN SODIUM-DEXTROSE 2-3 GM-% IV SOLR
2.0000 g | INTRAVENOUS | Status: AC
  Administered 2015-06-26: 2 g via INTRAVENOUS
  Filled 2015-06-26: qty 50

## 2015-06-26 MED ORDER — PROPOFOL 10 MG/ML IV BOLUS
INTRAVENOUS | Status: DC | PRN
  Administered 2015-06-26: 200 mg via INTRAVENOUS

## 2015-06-26 MED ORDER — NEOSTIGMINE METHYLSULFATE 10 MG/10ML IV SOLN
INTRAVENOUS | Status: DC | PRN
Start: 2015-06-26 — End: 2015-06-26
  Administered 2015-06-26: 4 mg via INTRAVENOUS

## 2015-06-26 MED ORDER — GLYCOPYRROLATE 0.2 MG/ML IJ SOLN
INTRAMUSCULAR | Status: AC
  Filled 2015-06-26: qty 1

## 2015-06-26 MED ORDER — ALBUTEROL SULFATE HFA 108 (90 BASE) MCG/ACT IN AERS
INHALATION_SPRAY | RESPIRATORY_TRACT | Status: DC | PRN
  Administered 2015-06-26 (×2): 2 via RESPIRATORY_TRACT

## 2015-06-26 MED ORDER — LIDOCAINE HCL (CARDIAC) 20 MG/ML IV SOLN
INTRAVENOUS | Status: DC | PRN
  Administered 2015-06-26: 50 mg via INTRAVENOUS

## 2015-06-26 MED ORDER — ONDANSETRON HCL 4 MG/2ML IJ SOLN
INTRAMUSCULAR | Status: AC
  Filled 2015-06-26: qty 2

## 2015-06-26 MED ORDER — ROCURONIUM BROMIDE 50 MG/5ML IV SOLN
INTRAVENOUS | Status: AC
Start: 2015-06-26 — End: 2015-06-26
  Filled 2015-06-26: qty 1

## 2015-06-26 MED ORDER — FENTANYL CITRATE (PF) 250 MCG/5ML IJ SOLN
INTRAMUSCULAR | Status: AC
  Filled 2015-06-26: qty 5

## 2015-06-26 MED ORDER — GLYCOPYRROLATE 0.2 MG/ML IJ SOLN
INTRAMUSCULAR | Status: DC | PRN
Start: 2015-06-26 — End: 2015-06-26
  Administered 2015-06-26: 0.6 mg via INTRAVENOUS

## 2015-06-26 MED ORDER — ONDANSETRON HCL 4 MG/2ML IJ SOLN
INTRAMUSCULAR | Status: DC | PRN
  Administered 2015-06-26: 4 mg via INTRAVENOUS

## 2015-06-26 MED ORDER — LACTATED RINGERS IV SOLN
INTRAVENOUS | Status: DC
  Administered 2015-06-26 (×3): via INTRAVENOUS

## 2015-06-26 MED ORDER — 0.9 % SODIUM CHLORIDE (POUR BTL) OPTIME
TOPICAL | Status: DC | PRN
  Administered 2015-06-26: 1000 mL

## 2015-06-26 MED ORDER — BUPIVACAINE HCL (PF) 0.5 % IJ SOLN
INTRAMUSCULAR | Status: AC
  Filled 2015-06-26: qty 30

## 2015-06-26 MED ORDER — ALBUTEROL SULFATE HFA 108 (90 BASE) MCG/ACT IN AERS
INHALATION_SPRAY | RESPIRATORY_TRACT | Status: AC
  Filled 2015-06-26: qty 6.7

## 2015-06-26 MED ORDER — ONDANSETRON HCL 4 MG/2ML IJ SOLN
4.0000 mg | Freq: Once | INTRAMUSCULAR | Status: AC | PRN
  Administered 2015-06-26: 4 mg via INTRAVENOUS
  Filled 2015-06-26: qty 2

## 2015-06-26 SURGICAL SUPPLY — 31 items
APL SKNCLS STERI-STRIP NONHPOA (GAUZE/BANDAGES/DRESSINGS) ×1
BAG HAMPER (MISCELLANEOUS) ×3 IMPLANT
BENZOIN TINCTURE PRP APPL 2/3 (GAUZE/BANDAGES/DRESSINGS) ×3 IMPLANT
CLOSURE WOUND 1/2 X4 (GAUZE/BANDAGES/DRESSINGS) ×1
CLOTH BEACON ORANGE TIMEOUT ST (SAFETY) ×3 IMPLANT
COVER LIGHT HANDLE STERIS (MISCELLANEOUS) ×6 IMPLANT
DECANTER SPIKE VIAL GLASS SM (MISCELLANEOUS) ×3 IMPLANT
DRSG OPSITE POSTOP 4X10 (GAUZE/BANDAGES/DRESSINGS) ×3 IMPLANT
DURAPREP 26ML APPLICATOR (WOUND CARE) ×3 IMPLANT
ELECT REM PT RETURN 9FT ADLT (ELECTROSURGICAL) ×3
ELECTRODE REM PT RTRN 9FT ADLT (ELECTROSURGICAL) ×1 IMPLANT
GLOVE BIOGEL PI IND STRL 7.0 (GLOVE) ×2 IMPLANT
GLOVE BIOGEL PI IND STRL 9 (GLOVE) ×1 IMPLANT
GLOVE BIOGEL PI INDICATOR 7.0 (GLOVE) ×4
GLOVE BIOGEL PI INDICATOR 9 (GLOVE) ×2
GLOVE ECLIPSE 6.5 STRL STRAW (GLOVE) ×3 IMPLANT
GLOVE ECLIPSE 9.0 STRL (GLOVE) ×3 IMPLANT
GOWN SPEC L3 XXLG W/TWL (GOWN DISPOSABLE) ×3 IMPLANT
GOWN STRL REUS W/TWL LRG LVL3 (GOWN DISPOSABLE) ×3 IMPLANT
INST SET MAJOR GENERAL (KITS) ×3 IMPLANT
KIT ROOM TURNOVER APOR (KITS) ×3 IMPLANT
MANIFOLD NEPTUNE II (INSTRUMENTS) ×3 IMPLANT
NEEDLE HYPO 25X1 1.5 SAFETY (NEEDLE) ×3 IMPLANT
NS IRRIG 1000ML POUR BTL (IV SOLUTION) ×3 IMPLANT
PACK ABDOMINAL MAJOR (CUSTOM PROCEDURE TRAY) ×3 IMPLANT
PAD ARMBOARD 7.5X6 YLW CONV (MISCELLANEOUS) ×3 IMPLANT
SET BASIN LINEN APH (SET/KITS/TRAYS/PACK) ×3 IMPLANT
STRIP CLOSURE SKIN 1/2X4 (GAUZE/BANDAGES/DRESSINGS) ×2 IMPLANT
SUT VICRYL 4 0 KS 27 (SUTURE) ×3 IMPLANT
SYR CONTROL 10ML LL (SYRINGE) ×3 IMPLANT
TOWEL OR 17X26 4PK STRL BLUE (TOWEL DISPOSABLE) ×3 IMPLANT

## 2015-06-26 NOTE — H&P (View-Only) (Signed)
Progress Notes    Expand All Collapse All   Patient ID: Barbara Jennings Eldred, female DOB: 08/01/1981, 34 y.o. MRN: [id number]  [facility]  Patient name: Henry Russelracy Jennings TilleyMRN [id number] Date of birth: 02/10/1981  CC & HPI:  Barbara Jennings Hanning is a 34 y.o. female presenting today for severe continuous pain in her suprapubic abdominal wall over the incision site made during her abdominal hysterectomy. Her pain has been constant and has worsened since the surgery performed by me about 6 months ago, on 01/02/15. Her pain is increased by touch and increased by physical activity. She states that nothing makes it better. Lying down, sitting, picking up objects, and staying still all do not affect it.  Trigger point injection is offered, accepted, and performed in left end of the old cesarean/hysterectomy scar, and 20 cc of 1% local anesthesia injected in to the fascia and overlying fatty tisses, with results being that the pain becomes significantly less. CT abdomen is reviewed screen by screen, no hernia or defect or specific mass in the area of concern in abd wall is identifiable. ROS:  A complete 10 system review of systems was obtained and all systems are negative except as noted in the HPI and PMH. No dietary component to pain. The pain is worsened by movement , lifting ,or sitting up.   Pertinent History Reviewed:  Reviewed: Significant for Cesarean section, ovarian cyst removal, D&C of uterus, abdominal hysterectomy Medical  Past Medical History  Diagnosis Date  . Asthma   . Anxiety   . Panic attack   . Depression   . Migraine   . Abnormal Pap smear   . HSV-2 (herpes simplex virus 2) infection   . Vaginal Pap smear, abnormal   . Unspecified symptom associated with female genital organs 01/27/2014  . Abnormal cervical Papanicolaou smear with positive human papilloma virus (HPV) DNA test 02/02/2014    Had ascus with  +HPV will get colpo  . Other and unspecified ovarian cyst 02/06/2014  . Hx of migraines 02/09/2014    Has ?aura, will rx POP  . Vulvar abscess 03/09/2014    [facility] bactrim ds x 10 days  . Abnormal uterine bleeding (AUB) 10/11/2014  . Dysmenorrhea 10/11/2014  . Thickened endometrium 10/18/2014  . Ovarian cyst 10/18/2014    Surgical Hx:  Past Surgical History  Procedure Laterality Date  . Cesarean section    . Ovarian cyst removal    . Appendectomy    . Cholecystectomy    . Dilation and curettage of uterus    . Laparoscopic supracervical hysterectomy N/A 01/02/2015    Procedure: ATTEMPTED LAPAROSCOPIC SUPRACERVICAL HYSTERECTOMY CONVERTED TO OPEN AT [id number]; Surgeon: Tilda BurrowJohn Zaraya Delauder V, MD; Location: [facility]; Service: Gynecology; Laterality: N/A;  . Supracervical abdominal hysterectomy N/A 01/02/2015    Procedure: HYSTERECTOMY SUPRACERVICAL ABDOMINAL; Surgeon: Tilda BurrowJohn Suhaas Agena V, MD; Location: [facility]; Service: Gynecology; Laterality: N/A;  . Abdominal hysterectomy     Medications: Reviewed & Updated - see associated section   Current outpatient prescriptions:  . ibuprofen (ADVIL,MOTRIN) 600 MG tablet, Take 1 tablet (600 mg total) by mouth every 6 (six) hours as needed for moderate pain., Disp: 60 tablet, Rfl: 1 . PARoxetine (PAXIL) 10 MG tablet, Take 10 mg by mouth daily., Disp: , Rfl:  . HYDROcodone-acetaminophen (NORCO/VICODIN) 5-325 MG per tablet, Take 1-2 tablets by mouth every 6 (six) hours as needed for moderate pain., Disp: , Rfl:  . [DISCONTINUED] citalopram (CELEXA) 20  MG tablet, Take 20 mg by mouth daily. , Disp: , Rfl:    Social History: Reviewed -  reports that she has been smoking Cigarettes. She has a 6 pack-year smoking history. She has never used smokeless tobacco.  Objective Findings:  Vitals: Blood pressure 124/80,  height 5' 6" (1.676 Jennings), weight 292 lb (132.45 kg).  Physical Examination: General appearance - alert, well appearing, and in no distress, oriented to person, place, and time and overweight Mental status - alert, oriented to person, place, and time, normal mood, behavior, speech, dress, motor activity, and thought processes, affect appropriate to mood Abdomen - soft abdomen, tenderness just superior and even with the left end of the old C-section/hysterectomy scar. No mass. Normal skin   Trigger Point Injection Procedure by Dr. Reann Dobias 9:53 AM - 20 cc's of 1% Lidocaine injected over incision site. 10:21 AM - Local area injection of fascia leads to improved sensation- "A lot better than it was."   Assessment & Plan:   A:  1. 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 Confirmed by trigger point injection performed in-office. Review of CT scan rules out hernia.  P:  1. Consider surgical excision of old scar, scheduled for 1 week from now.    This chart was SCRIBED for Denetta Fei, MD by Suzanne Le, ED Scribe. This patient was seen in room 2 and the patient's care was started at 9:35 AM.  I personally performed the services described in this documentation, which was SCRIBED in my presence. The recorded information has been reviewed and considered accurate. It has been edited as necessary during review. Aceson Labell V, MD       

## 2015-06-26 NOTE — Anesthesia Postprocedure Evaluation (Signed)
  Anesthesia Post-op Note  Patient: Barbara Jennings  Procedure(s) Performed: Procedure(s): ABDOMINAL SCAR REVISION (N/A)  Patient Location: PACU  Anesthesia Type:General  Level of Consciousness: awake, alert , patient cooperative and responds to stimulation  Airway and Oxygen Therapy: Patient Spontanous Breathing and Patient connected to face mask oxygen  Post-op Pain: none  Post-op Assessment: Post-op Vital signs reviewed, Patient's Cardiovascular Status Stable, Respiratory Function Stable, Patent Airway, No signs of Nausea or vomiting and Pain level controlled              Post-op Vital Signs: Reviewed and stable  Last Vitals:  Filed Vitals:   06/26/15 0850  BP: 117/70  Pulse:   Temp:   Resp: 15    Complications: No apparent anesthesia complications

## 2015-06-26 NOTE — Addendum Note (Signed)
Addendum  created 06/26/15 1142 by Shary DecampVedwattie Erinn Huskins, CRNA   Modules edited: Anesthesia Flowsheet

## 2015-06-26 NOTE — Addendum Note (Signed)
Addendum  created 06/26/15 1011 by Shary DecampVedwattie Areli Jowett, CRNA   Modules edited: Anesthesia Medication Administration

## 2015-06-26 NOTE — Interval H&P Note (Signed)
History and Physical Interval Note:  06/26/2015 8:48 AM  Barbara Jennings  has presented today for surgery, with the diagnosis of excision trigger point  The various methods of treatment have been discussed with the patient and family. After consideration of risks, benefits and other options for treatment, the patient has consented to  Procedure(s): ABDOMINAL SCAR REVISION (N/A) as a surgical intervention .  The patient's history has been reviewed, patient examined, no change in status, stable for surgery.  I have reviewed the patient's chart and labs.  Questions were answered to the patient's satisfaction.    The patient has a small folliculitis on the left labia majora. She is estimated to this. I do not believe that it should be addressed at this time but she should receive antibiotics to go home on and have this reassessed in the office in 5-7 days Tawan Corkern V

## 2015-06-26 NOTE — Brief Op Note (Signed)
06/26/2015  9:56 AM  PATIENT:  Barbara Jennings  34 y.o. female  PRE-OPERATIVE DIAGNOSIS:  Incision pain due to nerve entrapment in old scar  POST-OPERATIVE DIAGNOSIS:  Incision pain due to nerve entrapment in old scar   PROCEDURE:  Procedure(s): ABDOMINAL SCAR REVISION (N/A)[place]  SURGEON:  Surgeon(s) and Role:    * Tilda BurrowJohn Danijah Noh V, MD - Primary  PHYSICIAN ASSISTANT:   ASSISTANTS: henderson CST   ANESTHESIA:   local  EBL:     BLOOD ADMINISTERED:none  DRAINS: none   LOCAL MEDICATIONS USED:  MARCAINE    and Amount: 30 ml  SPECIMEN:  No Specimen  DISPOSITION OF SPECIMEN:  PATHOLOGY  COUNTS:  YES  TOURNIQUET:  * No tourniquets in log *  DICTATION: .Dragon Dictation  PLAN OF CARE: Admit to inpatient   PATIENT DISPOSITION:  PACU - hemodynamically stable.   Delay start of Pharmacological VTE agent (>24hrs) due to surgical blood loss or risk of bleeding: not applicable Details of procedure: Patient was taken operating room prepped and draped for lower abdominal surgery. Previously marked elliptical incision at the left and of the old Pfannenstiel incision was then excised, a 15 cm long by 4 cm incision of subcutaneous fat and skin, with sharp dissection of all fibrotic tissues back to mobile fatty tissue on both sides. There was quite a bit of fibrosis on the medial aspect of the incision that was taken out. This was dissected all the way down to the fascia. The fascia was palpated and found to be quite smooth and mobile. There was no fibrosis identifiable, no area of retraction or weakness identified.. It was felt that most likely the area of entrapment and discomfort was related to the fibrosis and subcutaneous fatty tissue. Therefore the fascia was left intact fatty tissue dissection site was irrigated copiously with saline solution point cautery used as necessary to confirm hemostasis and the defect used by 4 interrupted horizontal mattress sutures and subcutaneous fatty  space approximation of the skin edges and subcuticular 4-0 Monocryl was used to close the skin. Upon completion of procedure that portion of the incision was much more mobile, with symmetric approximation of tissues. Marcaine was injected throughout the tissue edges 30 cc including injection beneath the fascia. Subcuticular 4-0 Vicryl closure of the The patient be allowed to go home today with pain medicines Motrin and Vicodin for follow-up in 2 weeks for office incision recheck. Sponge and needle counts correct

## 2015-06-26 NOTE — Anesthesia Preprocedure Evaluation (Signed)
Anesthesia Evaluation  Patient identified by MRN, date of birth, ID band Patient awake    Reviewed: Allergy & Precautions, NPO status , Patient's Chart, lab work & pertinent test results  Airway Mallampati: I  TM Distance: >3 FB     Dental  (+) Teeth Intact   Pulmonary asthma , Current Smoker,  breath sounds clear to auscultation        Cardiovascular negative cardio ROS  Rhythm:Regular Rate:Normal     Neuro/Psych  Headaches, PSYCHIATRIC DISORDERS Anxiety Depression    GI/Hepatic negative GI ROS,   Endo/Other  Morbid obesity  Renal/GU      Musculoskeletal   Abdominal   Peds  Hematology   Anesthesia Other Findings   Reproductive/Obstetrics                             Anesthesia Physical Anesthesia Plan  ASA: II  Anesthesia Plan: General   Post-op Pain Management:    Induction: Intravenous  Airway Management Planned: Oral ETT  Additional Equipment:   Intra-op Plan:   Post-operative Plan: Extubation in OR  Informed Consent: I have reviewed the patients History and Physical, chart, labs and discussed the procedure including the risks, benefits and alternatives for the proposed anesthesia with the patient or authorized representative who has indicated his/her understanding and acceptance.     Plan Discussed with:   Anesthesia Plan Comments:         Anesthesia Quick Evaluation

## 2015-06-26 NOTE — Transfer of Care (Signed)
Immediate Anesthesia Transfer of Care Note  Patient: Barbara Jennings  Procedure(s) Performed: Procedure(s): ABDOMINAL SCAR REVISION (N/A)  Patient Location: PACU  Anesthesia Type:General  Level of Consciousness: awake, alert , patient cooperative and responds to stimulation  Airway & Oxygen Therapy: Patient Spontanous Breathing and Patient connected to face mask oxygen  Post-op Assessment: Report given to RN, Post -op Vital signs reviewed and stable and Patient moving all extremities X 4  Post vital signs: Reviewed and stable  Last Vitals:  Filed Vitals:   06/26/15 0850  BP: 117/70  Pulse:   Temp:   Resp: 15    Complications: No apparent anesthesia complications

## 2015-06-26 NOTE — Anesthesia Procedure Notes (Signed)
Procedure Name: Intubation Date/Time: 06/26/2015 9:03 AM Performed by: Patrcia DollyMOSES, Maksymilian Mabey Pre-anesthesia Checklist: Patient identified, Patient being monitored, Timeout performed, Emergency Drugs available and Suction available Patient Re-evaluated:Patient Re-evaluated prior to inductionOxygen Delivery Method: Circle System Utilized Preoxygenation: Pre-oxygenation with 100% oxygen Intubation Type: IV induction Ventilation: Mask ventilation without difficulty Laryngoscope Size: Miller and 2 Grade View: Grade I Tube type: Oral Tube size: 7.0 mm Number of attempts: 1 Airway Equipment and Method: Stylet Placement Confirmation: ETT inserted through vocal cords under direct vision,  positive ETCO2 and breath sounds checked- equal and bilateral Secured at: 21 cm Tube secured with: Tape Dental Injury: Teeth and Oropharynx as per pre-operative assessment

## 2015-06-26 NOTE — Op Note (Signed)
Please see the brief operative note for surgical details 

## 2015-06-26 NOTE — Discharge Instructions (Signed)
Incision Care An incision is when a surgeon cuts into your body tissues. After surgery, the incision needs to be cared for properly to prevent infection.  HOME CARE INSTRUCTIONS   Take all medicine as directed by your caregiver. Only take over-the-counter or prescription medicines for pain, discomfort, or fever as directed by your caregiver.  Do not remove your bandage (dressing) or get your incision wet until your surgeon gives you permission, 5 DAYS. In the event that your dressing becomes wet, dirty, or starts to smell, change the dressing and call your surgeon for instructions as soon as possible.  Take showers. Do not take tub baths, swim, or do anything that may soak the wound until THE DRESSING IS REMOVED  Resume your normal diet and activities as directed or allowed.  Avoid lifting any weight THAT requires you to lock your Breath in, in order to lift.  Use anti-itch antihistamine medicine as directed by your caregiver. The wound may itch when it is healing. Do not pick or scratch at the wound.  Follow up with your caregiver for stitch (suture) or staple removal as directed.  Drink enough fluids to keep your urine clear or pale yellow. SEEK MEDICAL CARE IF:   You have redness, swelling, or increasing pain in the wound that is not controlled with medicine.  You have drainage, blood, or pus coming from the wound that lasts longer than 1 day.  You develop muscle aches, chills, or a general ill feeling.  You notice a bad smell coming from the wound or dressing.  Your wound edges separate after the sutures, staples, or skin adhesive strips have been removed.  You develop persistent nausea or vomiting. SEEK IMMEDIATE MEDICAL CARE IF:   You have a fever.  You develop a rash.  You develop dizzy episodes or faint while standing.  You have difficulty breathing.  You develop any reaction or side effects to medicine given. MAKE SURE YOU:   Understand these  instructions.  Will watch your condition.  Will get help right away if you are not doing well or get worse. Document Released: 07/04/2005 Document Revised: 03/08/2012 Document Reviewed: 02/08/2014 [facility] Patient Information 2015 [employer] [place]. This information is not intended to replace advice given to you by your health care provider. Make sure you discuss any questions you have with your health care provider.

## 2015-06-27 ENCOUNTER — Telehealth: Payer: Self-pay | Admitting: Obstetrics and Gynecology

## 2015-06-27 ENCOUNTER — Encounter (HOSPITAL_COMMUNITY): Payer: Self-pay | Admitting: Obstetrics and Gynecology

## 2015-06-27 NOTE — Telephone Encounter (Signed)
Pt was informed to continue taking antibiotic and to apply warm compresses to area on her bottom. Pt was also advised to cal us back if anything changes.Pt verbalized understanding.

## 2015-07-03 ENCOUNTER — Ambulatory Visit (INDEPENDENT_AMBULATORY_CARE_PROVIDER_SITE_OTHER): Payer: Medicaid Other | Admitting: Obstetrics and Gynecology

## 2015-07-03 ENCOUNTER — Encounter: Payer: Self-pay | Admitting: Obstetrics and Gynecology

## 2015-07-03 VITALS — BP 102/56 | Ht 66.0 in | Wt 292.0 lb

## 2015-07-03 DIAGNOSIS — Z9889 Other specified postprocedural states: Secondary | ICD-10-CM

## 2015-07-03 MED ORDER — HYDROCODONE-ACETAMINOPHEN 5-325 MG PO TABS: 1.0000 | ORAL_TABLET | Freq: Four times a day (QID) | ORAL | Status: DC | PRN

## 2015-07-03 NOTE — Progress Notes (Signed)
Patient ID: Barbara Jennings, female   DOB: 05/01/1981, 34 y.o.   MRN: [id number] Pt here today for post op visit after a scar revision. PT denies any problems or concerns at this time..   Subjective:  Barbara Jennings is a 34 y.o. female now 1 weeks status post scar revision.   pt is uncomfortable in area of incision. No redness, no fever  Review of Systems Negative except  That discomfort has not reduced much compared to pre-surgery r diet:   Reg diet   Bowel movements : normal.  Pain is controlled with current analgesics. Medications being used: narcotic analgesics including hydrocodone.  Objective:  BP 102/56 mmHg  Ht 5\' 6"  (1.676 m)  Wt 292 lb (132.45 kg)  BMI 47.15 kg/m2  LMP  (LMP Unknown) General:Well developed, well nourished.  No acute distress. Abdomen: Bowel sounds normal, soft, non-tender. Pelvic Exam:    External Genitalia:  Normal.    Vagina: Normal     Incision(s):   Healing well, no drainage, no erythema, no hernia, no swelling, no dehiscence,     Assessment:  Post-Op 1 weeks s/p scar revision   poor improvement in pain so far Nonetheless, Doing well postoperatively.   Plan:  1.Wound care discussed   2. . current medications.pain meds 3. Activity restrictions: no bending, stooping, or squatting and no lifting more than 15 pounds 4. return to work: 2-3 weeks. 5. Follow up in 3 weeks.

## 2015-07-12 ENCOUNTER — Emergency Department (HOSPITAL_COMMUNITY)
Admission: EM | Admit: 2015-07-12 | Discharge: 2015-07-12 | Disposition: A | Discharge status: Home / Self Care | Payer: Medicaid Other | Attending: Emergency Medicine | Admitting: Emergency Medicine

## 2015-07-12 ENCOUNTER — Encounter (HOSPITAL_COMMUNITY): Payer: Self-pay | Admitting: *Deleted

## 2015-07-12 DIAGNOSIS — Z8679 Personal history of other diseases of the circulatory system: Secondary | ICD-10-CM | POA: Insufficient documentation

## 2015-07-12 DIAGNOSIS — Z8742 Personal history of other diseases of the female genital tract: Secondary | ICD-10-CM | POA: Insufficient documentation

## 2015-07-12 DIAGNOSIS — Z8619 Personal history of other infectious and parasitic diseases: Secondary | ICD-10-CM | POA: Diagnosis not present

## 2015-07-12 DIAGNOSIS — Z720 Tobacco use: Secondary | ICD-10-CM | POA: Insufficient documentation

## 2015-07-12 DIAGNOSIS — Z90710 Acquired absence of both cervix and uterus: Secondary | ICD-10-CM | POA: Diagnosis not present

## 2015-07-12 DIAGNOSIS — F410 Panic disorder [episodic paroxysmal anxiety] without agoraphobia: Secondary | ICD-10-CM | POA: Insufficient documentation

## 2015-07-12 DIAGNOSIS — T8131XA Disruption of external operation (surgical) wound, not elsewhere classified, initial encounter: Secondary | ICD-10-CM | POA: Diagnosis not present

## 2015-07-12 DIAGNOSIS — Z9889 Other specified postprocedural states: Secondary | ICD-10-CM | POA: Diagnosis not present

## 2015-07-12 DIAGNOSIS — J45909 Unspecified asthma, uncomplicated: Secondary | ICD-10-CM | POA: Insufficient documentation

## 2015-07-12 DIAGNOSIS — Z4801 Encounter for change or removal of surgical wound dressing: Secondary | ICD-10-CM | POA: Diagnosis present

## 2015-07-12 DIAGNOSIS — F329 Major depressive disorder, single episode, unspecified: Secondary | ICD-10-CM | POA: Insufficient documentation

## 2015-07-12 DIAGNOSIS — Y838 Other surgical procedures as the cause of abnormal reaction of the patient, or of later complication, without mention of misadventure at the time of the procedure: Secondary | ICD-10-CM | POA: Diagnosis not present

## 2015-07-12 DIAGNOSIS — Z5189 Encounter for other specified aftercare: Secondary | ICD-10-CM

## 2015-07-12 MED ORDER — HYDROCODONE-ACETAMINOPHEN 5-325 MG PO TABS
1.0000 | ORAL_TABLET | Freq: Once | ORAL | Status: AC
  Administered 2015-07-12: 1 via ORAL
  Filled 2015-07-12: qty 1

## 2015-07-12 NOTE — ED Notes (Signed)
Surgical incision from revision of C-section scar on L lower abdomen opened up around 1830 tonight. Surgery was done 06/26/15.

## 2015-07-12 NOTE — Discharge Instructions (Signed)
Call Dr. Rayna SextonFerguson's office tomorrow for recheck.

## 2015-07-12 NOTE — ED Provider Notes (Signed)
CSN: [id number]     Arrival date & time 07/12/15  2035 History   First MD Initiated Contact with Patient 07/12/15 2130     Chief Complaint  Patient presents with  . open incision      (Consider location/radiation/quality/duration/timing/severity/associated sxs/prior Treatment) HPI Barbara Jennings is a 34 y.o. female with multiple medical problems as listed below in PMH presents to the ED for wound incision check. She had a revision of her C/S and abdominal hysterectomy scar 06/26/15 and tonight the incision opened. She reports that she felt some pain in the area to the left side of her abdomen earlier today and then tonight noted bleeding from the incision. She denies any other problems.  Past Medical History  Diagnosis Date  . Asthma   . Anxiety   . Panic attack   . Depression   . Migraine   . Abnormal Pap smear   . HSV-2 (herpes simplex virus 2) infection   . Vaginal Pap smear, abnormal   . Unspecified symptom associated with female genital organs 01/27/2014  . 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 Other and unspecified ovarian cyst 02/06/2014  . Hx of migraines 02/09/2014    Has ?aura, will rx POP  . Vulvar abscess 03/09/2014    [facility] was rx'd bactrim ds x 10 days  . Abnormal uterine bleeding (AUB) 10/11/2014  . Dysmenorrhea 10/11/2014  . Thickened endometrium 10/18/2014  . Ovarian cyst 10/18/2014   Past Surgical History  Procedure Laterality Date  . Cesarean section    . Ovarian cyst removal    . Appendectomy    . Cholecystectomy    . Dilation and curettage of uterus    . Laparoscopic supracervical hysterectomy N/A 01/02/2015    Procedure: ATTEMPTED LAPAROSCOPIC SUPRACERVICAL HYSTERECTOMY CONVERTED TO OPEN AT [id number];  Surgeon: Tilda BurrowJohn Ferguson V, MD;  Location: [facility];  Service: Gynecology;  Laterality: N/A;  . Supracervical abdominal hysterectomy N/A 01/02/2015   Procedure: HYSTERECTOMY SUPRACERVICAL ABDOMINAL;  Surgeon: Tilda BurrowJohn Ferguson V, MD;  Location: [facility];  Service: Gynecology;  Laterality: N/A;  . Abdominal hysterectomy    . Scar revision N/A 06/26/2015    Procedure: ABDOMINAL SCAR REVISION;  Surgeon: Tilda BurrowJohn Ferguson V, MD;  Location: [facility];  Service: Gynecology;  Laterality: N/A;   Family History  Problem Relation Age of Onset  . Ulcers Father   . Cancer Maternal Aunt     breast   . Diabetes Maternal Grandmother   . Diabetes Maternal Grandfather   . Alzheimer's disease Paternal Grandmother   . Diabetes Maternal Aunt   . Cancer Paternal Aunt     cervical   History  Substance Use Topics  . Smoking status: Current Every Day Smoker -- 0.50 packs/day for 12 years    Types: Cigarettes  . Smokeless tobacco: Never Used  . Alcohol Use: Yes     Comment: socially   OB History    Gravida Para Term Preterm AB TAB SAB Ectopic Multiple Living   7 2 1 1 5  5   1      Review of Systems Negative except as stated in HPI   Allergies  Coconut flavor  Home Medications   Prior to Admission medications   Medication Sig Start Date End Date Taking? Authorizing Provider  cephALEXin (KEFLEX) 500 MG capsule Take 1 capsule (500 mg total) by mouth 4 (four)  times daily. 06/26/15   Tilda Burrow, MD  HYDROcodone-acetaminophen (NORCO/VICODIN) 5-325 MG per tablet Take 1-2 tablets by mouth every 6 (six) hours as needed for moderate pain. 07/03/15   Tilda Burrow, MD  PARoxetine (PAXIL) 10 MG tablet Take 10 mg by mouth daily.    Historical Provider, MD   BP 143/79 mmHg  Pulse 96  Temp(Src) 98.4 F (36.9 C) (Oral)  Resp 18  Ht  (1.676 m)  Wt 284 lb (128.822 kg)  BMI 45.86 kg/m2  SpO2 100%  LMP  (LMP Unknown) Physical Exam  Constitutional: She is oriented to person, place, and time. She appears well-developed and well-nourished.  HENT:  Head: Normocephalic.  Eyes: EOM are normal.  Neck: Neck supple.  Cardiovascular: Normal rate.     Pulmonary/Chest: Effort normal.  Abdominal: Soft.    Approximately 1.5 cm area to the left side of the incision with minimal bleeding. No red streaking or signs of infection.   Musculoskeletal: Normal range of motion.  Neurological: She is alert and oriented to person, place, and time. No cranial nerve deficit.  Skin: Skin is warm and dry.  Psychiatric: She has a normal mood and affect. Her behavior is normal.  Nursing note and vitals reviewed.   ED Course  Procedures ( Wound cleaned with NSS, steri strips applied.  Consult with Dr. Despina Hidden will treat the patient's pain tonight, clean the wound and apply steri strips. Patient to follow up in the office tomorrow.   MDM  34 y.o. female with pain and minimal bleeding from incision site. Stable for d/c without signs of infection. Discussed with the patient and all questioned fully answered. She voices understanding and agrees with plan.    Final diagnoses:  Visit for wound check       [facility], NP 07/13/15 4098  Bethann Berkshire, MD 07/16/15 1357

## 2015-07-12 NOTE — ED Notes (Signed)
Pt given Discharge instructions - verbalized understanding . Ambulated of unit

## 2015-07-13 ENCOUNTER — Encounter: Payer: Self-pay | Admitting: Obstetrics and Gynecology

## 2015-07-13 ENCOUNTER — Ambulatory Visit (INDEPENDENT_AMBULATORY_CARE_PROVIDER_SITE_OTHER): Payer: Medicaid Other | Admitting: Obstetrics and Gynecology

## 2015-07-13 VITALS — BP 118/60 | Ht 66.0 in | Wt 289.5 lb

## 2015-07-13 DIAGNOSIS — Z5189 Encounter for other specified aftercare: Secondary | ICD-10-CM

## 2015-07-13 DIAGNOSIS — Z9889 Other specified postprocedural states: Secondary | ICD-10-CM

## 2015-07-13 MED ORDER — CEPHALEXIN 500 MG PO CAPS: 500.0000 mg | ORAL_CAPSULE | Freq: Four times a day (QID) | ORAL | Status: DC

## 2015-07-13 MED ORDER — HYDROCHLOROTHIAZIDE 25 MG PO TABS: 25.0000 mg | ORAL_TABLET | Freq: Every day | ORAL | Status: DC

## 2015-07-13 MED ORDER — HYDROCODONE-ACETAMINOPHEN 5-325 MG PO TABS: 1.0000 | ORAL_TABLET | Freq: Four times a day (QID) | ORAL | Status: DC | PRN

## 2015-07-13 NOTE — Progress Notes (Signed)
Patient ID: Barbara Jennings, female   DOB: 06/02/1981, 34 y.o.   MRN: [id number]  Subjective:     Barbara Jennings is a 34 y.o. female who presents to the clinic 2 weeks status post scar revision over incision site from  abdominal hysterectomy on 01/02/15. She had the scar revision done on 6/28. Pt states that one corner of her incision opened yesterday. She was seen in the ED and instructed to follow-up in our office this morning. Pt has been taking Ibuprofen for pain with no relief. She has not been heavy lifting, bending or stooping. Pt denies any redness, odor or drainage.   Diet:       regular with difficulty. Bowel function is: normal. Pain:     Pain is not well controlled.  Medications being used: ibuprofen (OTC) and prescription NSAID's including Ibuprofen-600 mg.  The following portions of the patient's history were reviewed and updated as appropriate: allergies, current medications, past family history, past medical history, past social history, past surgical history and problem list.  Review of Systems Pertinent items are noted in HPI.    Objective:    BP 118/60 mmHg  Ht 5\' 6"  (1.676 m)  Wt 289 lb 8 oz (131.316 kg)  BMI 46.75 kg/m2  LMP  (LMP Unknown) General:  alert and cooperative  Abdomen: soft, bowel sounds active, non-tender  Incision:   healing well, no drainage, no erythema, no hernia, no seroma, no swelling, no dehiscence, incision well approximated       Pelvic: None    Assessment:    Doing well postoperatively. Incision separation left corner x 2 cm. Superficial. Operative findings again reviewed. Pathology report discussed.    Plan:    1. Continue any current medications. 2. Wound care discussed. 3. Activity restrictions: no bending, stooping, or squatting and no lifting more than 10 pounds 4. Anticipated return to work: not applicable. 5. Follow up: 2 weeks for  Wound check. 6. Applied steri-strips, HCTZ for 10 days as diurectic and keflex as a precaution.     This chart was scribed for Tilda BurrowJohn Perfecto Purdy V, MD by Gwenyth Oberatherine Macek, Medical Scribe. This patient was seen in room 2 and the patient's care was started at 10:24 AM.   I personally performed the services described in this documentation, which was SCRIBED in my presence. The recorded information has been reviewed and considered accurate. It has been edited as necessary during review. Tilda BurrowFERGUSON,Wilmary Levit V, MD

## 2015-07-13 NOTE — Progress Notes (Signed)
Patient ID: Barbara Jennings, female   DOB: 11/25/1981, 34 y.o.   MRN: [id number] Pt here today for post op problem. Pt states that her incision opened yesterday. Pt states that s stitch popped or something. Pt states that she is still hurting and the Ibuprofen is not helping. Pt rates her pain a 8 on a scale from 0-10 this morning. Pt states that when the incision opened it bled really bad but has not had any bleeding today, pt had to go to the ED last night due to the bleeding. Pt denies any redness, odor or drainage. Pt states that she has not done any heavy lifting or bending, nothing she was not supposed to do.

## 2015-07-24 ENCOUNTER — Encounter: Payer: Self-pay | Admitting: Obstetrics and Gynecology

## 2015-07-24 ENCOUNTER — Ambulatory Visit (INDEPENDENT_AMBULATORY_CARE_PROVIDER_SITE_OTHER): Payer: Medicaid Other | Admitting: Obstetrics and Gynecology

## 2015-07-24 VITALS — BP 120/76 | Ht 66.0 in | Wt 292.0 lb

## 2015-07-24 DIAGNOSIS — R1032 Left lower quadrant pain: Secondary | ICD-10-CM

## 2015-07-24 MED ORDER — HYDROCODONE-ACETAMINOPHEN 5-325 MG PO TABS: 1.0000 | ORAL_TABLET | Freq: Four times a day (QID) | ORAL | Status: DC | PRN

## 2015-07-24 NOTE — Progress Notes (Signed)
Patient ID: Barbara Jennings, female   DOB: 22-Apr-1981, 34 y.o.   MRN: [id number]   [facility] Clinic Visit  Patient name: CAILYN HOUDEK MRN [id number]  Date of birth: [birth year]  CC & HPI:  MINNAH LLAMAS is a 34 y.o. female presenting today for followup of surgery. Pt is NO better than before last surgery. Pain is constant ,worse with activity. Rated a 6-10/10 with activities, 6 with vicodin  ROS:  Pain essentially unchanged by surgery , even tho pt still agrees that our local anesthesia test injections preoperatively diminished the pain, suggesting a sub q or fascial origin, which was not identified at the time of the wide excision of the old scar down to the fascia, and inspection of the fascia which was found intact.  Currently the area of the scar exploration is clean, well healed, and slightly red in comparison to rest of abdomen which pt attributes to wearing her old jeans, *& not present when wearing loose garments..  Pertinent History Reviewed:   Reviewed: Significant for see surg hx Medical         Past Medical History  Diagnosis Date  . Asthma   . Anxiety   . Panic attack   . Depression   . Migraine   . Abnormal Pap smear   . HSV-2 (herpes simplex virus 2) infection   . Vaginal Pap smear, abnormal   . Unspecified symptom associated with female genital organs 01/27/2014  . 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 Other and unspecified ovarian cyst 02/06/2014  . Hx of migraines 02/09/2014    Has ?aura, will rx POP  . Vulvar abscess 03/09/2014    I&D 3/9 in ER at [facility] was rx'd bactrim ds x 10 days  . Abnormal uterine bleeding (AUB) 10/11/2014  . Dysmenorrhea 10/11/2014  . Thickened endometrium 10/18/2014  . Ovarian cyst 10/18/2014                              Surgical Hx:    Past Surgical History  Procedure Laterality Date  . Cesarean section    . Ovarian cyst removal     . Appendectomy    . Cholecystectomy    . Dilation and curettage of uterus    . Laparoscopic supracervical hysterectomy N/A 01/02/2015    Procedure: ATTEMPTED LAPAROSCOPIC SUPRACERVICAL HYSTERECTOMY CONVERTED TO OPEN AT 6213;  Surgeon: Tilda Burrow, MD;  Location: [facility];  Service: Gynecology;  Laterality: N/A;  . Supracervical abdominal hysterectomy N/A 01/02/2015    Procedure: HYSTERECTOMY SUPRACERVICAL ABDOMINAL;  Surgeon: Tilda Burrow, MD;  Location: [facility];  Service: Gynecology;  Laterality: N/A;  . Abdominal hysterectomy    . Scar revision N/A 06/26/2015    Procedure: ABDOMINAL SCAR REVISION;  Surgeon: Tilda Burrow, MD;  Location: [facility];  Service: Gynecology;  Laterality: N/A;   Medications: Reviewed & Updated - see associated section                       Current outpatient prescriptions:  .  cephALEXin (KEFLEX) 500 MG capsule, Take 1 capsule (500 mg total) by mouth 4 (four) times daily., Disp: 28 capsule, Rfl: 0 .  hydrochlorothiazide (HYDRODIURIL) 25 MG tablet, Take 1 tablet (25 mg total) by mouth daily., Disp: 30 tablet, Rfl: 0 .  ibuprofen (  ADVIL,MOTRIN) 600 MG tablet, Take 600 mg by mouth every 6 (six) hours as needed., Disp: , Rfl:  .  HYDROcodone-acetaminophen (NORCO/VICODIN) 5-325 MG per tablet, Take 1-2 tablets by mouth every 6 (six) hours as needed for moderate pain., Disp: 60 tablet, Rfl: 0 .  [DISCONTINUED] citalopram (CELEXA) 20 MG tablet, Take 20 mg by mouth daily.  , Disp: , Rfl:    Social History: Reviewed -  reports that she has been smoking Cigarettes.  She has a 6 pack-year smoking history. She has never used smokeless tobacco.  Objective Findings:  Vitals: Blood pressure 120/76, height 5\' 6"  (1.676 m), weight 292 lb (132.45 kg).  Physical Examination: General appearance - alert, well appearing, and in no distress, oriented to person, place, and time and overweight Mental status - alert, oriented to person, place, and time, normal mood, behavior, speech,  dress, motor activity, and thought processes Chest - clear to auscultation, no wheezes, rales or rhonchi, symmetric air entry Abdomen - soft, nontender, nondistended, no masses or organomegaly tenderness noted to contact with skin around incision , even tho it appears normal and slightly red from the clothing, without swelling or d/c Extremities - peripheral pulses normal, no pedal edema, no clubbing or cyanosis, Homan's sign negative bilaterally   Assessment & Plan:   A:  1. Chronic LLq pain unresponsive to excision of prior sccarring P renew vicodin    CT ABD pelvis  P:  1. Out of wk x 2 wk 2. After review of CT, consider Laparoscopy if indicated

## 2015-07-24 NOTE — Progress Notes (Signed)
Patient ID: Barbara Jennings, female   DOB: 02-20-1981, 34 y.o.   MRN: [id number] Pt here today for follow up. Pt states that she is still having pain and discomfort and that the ibuprofen is not helping.

## 2015-08-01 ENCOUNTER — Telehealth: Payer: Self-pay | Admitting: Obstetrics and Gynecology

## 2015-08-01 NOTE — Telephone Encounter (Signed)
Pt states that she spoke with Chrystal from our office and was told that she follow up with Dr. Emelda Fear and find out what needs to be done and make sure everything was ok with her insurance and then get back in touch with her. I advised the pt that she would hear from one of [place] by this afternoon. Pt verbalized understanding.

## 2015-08-01 NOTE — Telephone Encounter (Signed)
Pt informed Dr. Emelda Fear had placed order for CT scan, will call and schedule appt date and time for CT and see if pre-cert required. Pt verbalized understanding.

## 2015-08-02 ENCOUNTER — Telehealth: Payer: Self-pay | Admitting: *Deleted

## 2015-08-02 NOTE — Telephone Encounter (Signed)
Pt informed per Dr. Emelda Fear will not refill Hydrocodone until next week. Pt given #60 Hydrocodone on 07/24/2015. Pt verbalized understanding.

## 2015-08-02 NOTE — Telephone Encounter (Signed)
Pt requesting refill on Hydrocodone for LLQ pain.  Pt informed of CT scan scheduled at [facility], August 06, 2015 @ 7:45 am, NPO after midnight, Pick up contrast day before. Pt verbalized understanding.

## 2015-08-03 ENCOUNTER — Other Ambulatory Visit: Payer: Self-pay | Admitting: Obstetrics and Gynecology

## 2015-08-03 DIAGNOSIS — R1032 Left lower quadrant pain: Secondary | ICD-10-CM

## 2015-08-03 MED ORDER — HYDROCODONE-ACETAMINOPHEN 5-325 MG PO TABS: 1.0000 | ORAL_TABLET | Freq: Four times a day (QID) | ORAL | Status: DC | PRN

## 2015-08-06 ENCOUNTER — Ambulatory Visit (HOSPITAL_COMMUNITY)
Admission: RE | Admit: 2015-08-06 | Discharge: 2015-08-06 | Disposition: A | Discharge status: Home / Self Care | Payer: Medicaid Other | Source: Ambulatory Visit | Attending: Obstetrics and Gynecology | Admitting: Obstetrics and Gynecology

## 2015-08-06 ENCOUNTER — Encounter (HOSPITAL_COMMUNITY): Payer: Self-pay

## 2015-08-06 DIAGNOSIS — R1032 Left lower quadrant pain: Secondary | ICD-10-CM | POA: Diagnosis not present

## 2015-08-06 DIAGNOSIS — R935 Abnormal findings on diagnostic imaging of other abdominal regions, including retroperitoneum: Secondary | ICD-10-CM | POA: Insufficient documentation

## 2015-08-06 MED ORDER — IOHEXOL 300 MG/ML  SOLN
100.0000 mL | Freq: Once | INTRAMUSCULAR | Status: AC | PRN
Start: 2015-08-06 — End: 2015-08-06
  Administered 2015-08-06: 100 mL via INTRAVENOUS

## 2015-08-07 ENCOUNTER — Other Ambulatory Visit (INDEPENDENT_AMBULATORY_CARE_PROVIDER_SITE_OTHER): Payer: Medicaid Other

## 2015-08-07 ENCOUNTER — Encounter: Payer: Self-pay | Admitting: Obstetrics and Gynecology

## 2015-08-07 ENCOUNTER — Ambulatory Visit (INDEPENDENT_AMBULATORY_CARE_PROVIDER_SITE_OTHER): Payer: Medicaid Other | Admitting: Obstetrics and Gynecology

## 2015-08-07 ENCOUNTER — Other Ambulatory Visit: Payer: Self-pay | Admitting: Obstetrics and Gynecology

## 2015-08-07 VITALS — BP 124/70 | Ht 66.0 in | Wt 290.0 lb

## 2015-08-07 DIAGNOSIS — T888XXA Other specified complications of surgical and medical care, not elsewhere classified, initial encounter: Secondary | ICD-10-CM

## 2015-08-07 NOTE — Progress Notes (Signed)
Patient ID: Barbara Jennings, female   DOB: Feb 15, 1981, 34 y.o.   MRN: [id number] Pt here today for follow up. Pt states that things are still the same as before. Pt has to take pain medications to function.  Late entry  [facility] Clinic Visit  Patient name: Barbara Jennings MRN [id number]  Date of birth: 03/28/1981  CC & HPI:  Barbara Jennings is a 34 y.o. female presenting today for followup of LLQ abd wall pain s/p hysterectomy, not helped by recent exsion of sub q scar tissue. U/s is done as part of visit, and needle aspiration of identified seroma ULTRASOUND GUIDED PELVIC ASPIRATION    Barbara Jennings is a 34 y.o. [phone] s/p hysterectomy,is here for a pelvic sonogram for a fluid aspiration.  complex pelvic fluid collection 11.3 x 2.9 x 1.5 cm   Technician Comments:  US pelvic guided fluid aspiration was preformed by Dr. Emelda Fear, 30 cc of fluid was removed from a 11.3 x 2.9 x 1.5cm complex fluid collection, pt tolerated procedure well.    Barbara Jennings 08/07/2015 5:13 PM  Clinical Impression and recommendations:  I have reviewed the sonogram results above.  Combined with the patient's current clinical course, below are my impressions and any appropriate recommendations for management based on the sonographic findings:  1. Aspiration of 30 cc of clear fluid from deep in incision site done, with visible reduction in residual fluid. 2. Clinical care as noted in office visit this date. 3. Gram stain and cultures to be done. Fluid kept refridgerated overnite due to processing delay.  Barbara Jennings  ROS:    Pertinent History Reviewed:   Reviewed: Significant for chronic pain llq since hyst, and not relieved by recent surgery Medical         Past Medical History  Diagnosis Date  . Asthma   . Anxiety   . Panic attack   . Depression   . Migraine   . Abnormal Pap smear   . HSV-2 (herpes simplex virus 2) infection   . Vaginal Pap smear, abnormal   .  Unspecified symptom associated with female genital organs 01/27/2014  . 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 Other and unspecified ovarian cyst 02/06/2014  . Hx of migraines 02/09/2014    Has ?aura, will rx POP  . Vulvar abscess 03/09/2014    I&D 3/9 in ER at [facility] was rx'd bactrim ds x 10 days  . Abnormal uterine bleeding (AUB) 10/11/2014  . Dysmenorrhea 10/11/2014  . Thickened endometrium 10/18/2014  . Ovarian cyst 10/18/2014                              Surgical Hx:    Past Surgical History  Procedure Laterality Date  . Cesarean section    . Ovarian cyst removal    . Appendectomy    . Cholecystectomy    . Dilation and curettage of uterus    . Laparoscopic supracervical hysterectomy N/A 01/02/2015    Procedure: ATTEMPTED LAPAROSCOPIC SUPRACERVICAL HYSTERECTOMY CONVERTED TO OPEN AT 1308;  Surgeon: Tilda Burrow, MD;  Location: [facility];  Service: Gynecology;  Laterality: N/A;  . Supracervical abdominal hysterectomy N/A 01/02/2015    Procedure: HYSTERECTOMY SUPRACERVICAL ABDOMINAL;  Surgeon: Tilda Burrow, MD;  Location: [facility];  Service: Gynecology;  Laterality: N/A;  . Abdominal  hysterectomy    . Scar revision N/A 06/26/2015    Procedure: ABDOMINAL SCAR REVISION;  Surgeon: Tilda Burrow, MD;  Location: [facility];  Service: Gynecology;  Laterality: N/A;   Medications: Reviewed & Updated - see associated section                       Current outpatient prescriptions:  .  ibuprofen (ADVIL,MOTRIN) 600 MG tablet, Take 600 mg by mouth every 6 (six) hours as needed., Disp: , Rfl:  .  amitriptyline (ELAVIL) 25 MG tablet, Take 1 tablet (25 mg total) by mouth at bedtime., Disp: 30 tablet, Rfl: 3 .  HYDROcodone-acetaminophen (NORCO/VICODIN) 5-325 MG per tablet, Take 1-2 tablets by mouth every 6 (six) hours as needed for moderate pain or severe pain., Disp: 120 tablet, Rfl: 0 .  lidocaine  (LIDODERM) 5 %, Place 1 patch onto the skin daily. Remove & Discard patch within 12 hours or as directed by MD, Disp: 30 patch, Rfl: 0 .  [DISCONTINUED] citalopram (CELEXA) 20 MG tablet, Take 20 mg by mouth daily.  , Disp: , Rfl:    Social History: Reviewed -  reports that she has been smoking Cigarettes.  She has a 6 pack-year smoking history. She has never used smokeless tobacco.  Objective Findings:  Vitals: Blood pressure 124/70, height 5\' 6"  (1.676 m), weight 290 lb (131.543 kg).  Physical Examination: General appearance - alert, well appearing, and in no distress, overweight and chronically ill appearing Mental status - alert, oriented to person, place, and time, depressed mood Abdomen - as above   Assessment & Plan:   A: w 1. Wound seroma, rule out infection  P:  1. Wound seroma fluid to be sent for c&S, and gram stain 2 unable to process on day of collection due to lab issues F/u by phone

## 2015-08-07 NOTE — Progress Notes (Signed)
US pelvic guided fluid aspiration was preformed by Dr. Ferguson, 30 cc of fluid was removed from a 11.3 x  2.9 x 1.5cm complex fluid collection, pt tolerated procedure well. 

## 2015-08-07 NOTE — Progress Notes (Signed)
US pelvic guided fluid aspiration was preformed by Dr. Emelda Fear, 30 cc of fluid was removed from a 11.3 x  2.9 x 1.5cm complex fluid collection, pt tolerated procedure well.

## 2015-08-08 ENCOUNTER — Other Ambulatory Visit (HOSPITAL_COMMUNITY)
Admission: RE | Admit: 2015-08-08 | Discharge: 2015-08-08 | Disposition: A | Discharge status: Home / Self Care | Payer: Medicaid Other | Source: Ambulatory Visit | Attending: Obstetrics and Gynecology | Admitting: Obstetrics and Gynecology

## 2015-08-08 DIAGNOSIS — T888XXA Other specified complications of surgical and medical care, not elsewhere classified, initial encounter: Secondary | ICD-10-CM | POA: Diagnosis present

## 2015-08-08 LAB — GRAM STAIN

## 2015-08-09 ENCOUNTER — Other Ambulatory Visit: Payer: Self-pay | Admitting: Obstetrics and Gynecology

## 2015-08-10 LAB — GRAM STAIN: ORGANISM ID, BACTERIA: NONE SEEN

## 2015-08-13 ENCOUNTER — Telehealth: Payer: Self-pay | Admitting: Obstetrics and Gynecology

## 2015-08-13 ENCOUNTER — Other Ambulatory Visit: Payer: Self-pay | Admitting: Obstetrics and Gynecology

## 2015-08-13 DIAGNOSIS — L089 Local infection of the skin and subcutaneous tissue, unspecified: Secondary | ICD-10-CM

## 2015-08-13 DIAGNOSIS — S31109A Unspecified open wound of abdominal wall, unspecified quadrant without penetration into peritoneal cavity, initial encounter: Principal | ICD-10-CM

## 2015-08-13 LAB — CULTURE, BODY FLUID W GRAM STAIN -BOTTLE

## 2015-08-13 LAB — CULTURE, BODY FLUID-BOTTLE

## 2015-08-13 MED ORDER — CIPROFLOXACIN HCL 500 MG PO TABS: 500.0000 mg | ORAL_TABLET | Freq: Two times a day (BID) | ORAL | Status: DC

## 2015-08-13 NOTE — Telephone Encounter (Signed)
FINAL culture today growing Staph species S to cipro,and other antibiotics. Pt rx'd Cipro 500 bid x 14 days. Pt to reschedule appt in am x 1 wk.

## 2015-08-14 ENCOUNTER — Other Ambulatory Visit: Payer: Self-pay | Admitting: Obstetrics and Gynecology

## 2015-08-14 ENCOUNTER — Ambulatory Visit: Payer: Medicaid Other | Admitting: Obstetrics and Gynecology

## 2015-08-14 ENCOUNTER — Telehealth: Payer: Self-pay | Admitting: *Deleted

## 2015-08-14 DIAGNOSIS — T814XXD Infection following a procedure, subsequent encounter: Principal | ICD-10-CM

## 2015-08-14 DIAGNOSIS — IMO0001 Reserved for inherently not codable concepts without codable children: Secondary | ICD-10-CM

## 2015-08-14 MED ORDER — HYDROCODONE-ACETAMINOPHEN 5-325 MG PO TABS: 1.0000 | ORAL_TABLET | Freq: Four times a day (QID) | ORAL | Status: DC | PRN

## 2015-08-14 NOTE — Telephone Encounter (Signed)
Pt informed Hydrocodone Rx left at front desk for pick up. Pt also informed Cipro e-scribed to [employer]. Pt verbalized understanding.

## 2015-08-14 NOTE — Telephone Encounter (Signed)
-----   Message from Tilda Burrow, MD sent at 08/10/2015  7:58 AM EDT ----- Will Treat with Augmentin for now, awaiting final cultures. Culture delayed x i day with specimen stored sterile conditions maintained, in refridgeration at [facility] due to collection questions.

## 2015-08-14 NOTE — Telephone Encounter (Signed)
Pt aware that antibiotic has been called in but medication doesn't match the medication in his note. I advised the pt that I would double check with Dr. Emelda Fear and call her back. Pt verbalized understanding.

## 2015-08-21 ENCOUNTER — Encounter: Payer: Self-pay | Admitting: Obstetrics and Gynecology

## 2015-08-21 ENCOUNTER — Ambulatory Visit (INDEPENDENT_AMBULATORY_CARE_PROVIDER_SITE_OTHER): Payer: Medicaid Other | Admitting: Obstetrics and Gynecology

## 2015-08-21 VITALS — BP 120/76 | Ht 66.0 in | Wt 290.0 lb

## 2015-08-21 DIAGNOSIS — R1032 Left lower quadrant pain: Secondary | ICD-10-CM

## 2015-08-21 MED ORDER — AMITRIPTYLINE HCL 25 MG PO TABS: 25.0000 mg | ORAL_TABLET | Freq: Every day | ORAL | Status: DC

## 2015-08-21 MED ORDER — LIDOCAINE 5 % EX PTCH: 1.0000 | MEDICATED_PATCH | CUTANEOUS | Status: DC

## 2015-08-21 NOTE — Progress Notes (Signed)
Patient ID: Barbara Jennings, female   DOB: [birth year], 34 y.o.   MRN: [id number] Pt here today for follow up. Pt states that she is still having the pain as before. Pt states that the pain is bad and the hydrocodone doesn't help.

## 2015-08-22 LAB — SEDIMENTATION RATE: Sed Rate: 7 mm/hr (ref 0–32)

## 2015-08-22 NOTE — Progress Notes (Signed)
Patient ID: Barbara Jennings, female   DOB: 1981/12/09, 34 y.o.   MRN: [id number]   [facility] Visit  Patient name: Barbara Jennings MRN [id number]  Date of birth: 1981-08-17  CC & HPI:  Barbara Jennings is a 34 y.o. female presenting today for followup of LLQ pain s.p abdominal supracervical hysterectomy Jan 2016.that seems to be abdominal wall in origin. She has since had trigger point injections that seemed to confirm the abd wall as the pain source, and had an excision of the cicatrix on the left that did NOT help the pain, and was complicated by a deep subcutaneous wound seroma that was aspirated 2 weeks ago. Gram stain on the clear serous fluid was negative for bacteria, but an aerobic culture has grown Staph species. It was necessary to hold the wound serous fluid overnight , refridgerated,before cultures could be begun. It is my belief that the culture is a contaminant related to collection. A 7-day course of Augmentin has NOT altered the pain in any way, and the incision does not show any hardness or any significant erythema to suggest abscess, but shows very slight erythema, more consistent with the seroma dx. The pain persists. Bedside u/s today shows no signidicant fluid collection in the sub q space.  She continues to have a positive Carnett's sign, where abd wall is more tender when she tenses the abd wall muscles and palpation with the abdomen tight is more tender.  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  Pertinent History Reviewed:   Reviewed: Significant for  Medical         Past Medical History  Diagnosis Date  . Asthma   . Anxiety   . Panic attack   . Depression   . Migraine   . Abnormal Pap smear   . HSV-2 (herpes simplex virus 2) infection   . Vaginal Pap smear, abnormal   . Unspecified symptom associated with female genital organs 01/27/2014  . 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 Other and unspecified ovarian cyst 02/06/2014  . Hx of migraines 02/09/2014    Has ?aura, will rx POP  . Vulvar abscess 03/09/2014    I&D 3/9 in ER at [facility] was rx'd bactrim ds x 10 days  . Abnormal uterine bleeding (AUB) 10/11/2014  . Dysmenorrhea 10/11/2014  . Thickened endometrium 10/18/2014  . Ovarian cyst 10/18/2014                              Surgical Hx:    Past Surgical History  Procedure Laterality Date  . Cesarean section    . Ovarian cyst removal    . Appendectomy    . Cholecystectomy    . Dilation and curettage of uterus    . Laparoscopic supracervical hysterectomy N/A 01/02/2015    Procedure: ATTEMPTED LAPAROSCOPIC SUPRACERVICAL HYSTERECTOMY CONVERTED TO OPEN AT 3559;  Surgeon: Jonnie Kind, MD;  Location: [facility];  Service: Gynecology;  Laterality: N/A;  . Supracervical abdominal hysterectomy N/A 01/02/2015    Procedure: HYSTERECTOMY SUPRACERVICAL ABDOMINAL;  Surgeon: Jonnie Kind, MD;  Location: [facility];  Service: Gynecology;  Laterality: N/A;  . Abdominal hysterectomy    . Scar revision N/A 06/26/2015    Procedure: ABDOMINAL SCAR REVISION;  Surgeon:  Jonnie Kind, MD;  Location: [facility];  Service: Gynecology;  Laterality: N/A;   Medications: Reviewed & Updated - see associated section                       Current outpatient prescriptions:  .  HYDROcodone-acetaminophen (NORCO/VICODIN) 5-325 MG per tablet, Take 1-2 tablets by mouth every 6 (six) hours as needed for moderate pain or severe pain., Disp: 120 tablet, Rfl: 0 .  ibuprofen (ADVIL,MOTRIN) 600 MG tablet, Take 600 mg by mouth every 6 (six) hours as needed., Disp: , Rfl:  .  amitriptyline (ELAVIL) 25 MG tablet, Take 1 tablet (25 mg total) by mouth at bedtime., Disp: 30 tablet, Rfl: 3 .  lidocaine (LIDODERM) 5 %, Place 1 patch onto the skin daily. Remove & Discard patch within 12 hours or as directed by MD, Disp: 30 patch, Rfl: 0 .   [DISCONTINUED] citalopram (CELEXA) 20 MG tablet, Take 20 mg by mouth daily.  , Disp: , Rfl:    Social History: Reviewed -  reports that she has been smoking Cigarettes.  She has a 6 pack-year smoking history. She has never used smokeless tobacco.  Objective Findings:  Vitals: Blood pressure 120/76, height 5' 6"  (1.676 m), weight 290 lb (131.543 kg).  Physical Examination: General appearance - alert, well appearing, and in no distress, oriented to person, place, and time, overweight and chronically ill appearing Mental status - alert, oriented to person, place, and time, depressed mood Abdomen - tenderness noted in the LLQ, + Carnett's sign. U.s bedside does NOT show any significant fluid reaccumulation no rebound tenderness noted   Assessment & Plan:   A:  1. Chronic abd wall pain, not relieved by recent wound exploration. 2  Doubt wound infection   P:  1. D/c Augmentin. 2. lidoderm patches x 12 hr daily 3  Elavil 25 mg hs 4 continue Hydrocodone , pt has rx 5 seek consult with pain clinic, gen surgeon for further considerations. 6 check ESR to further rule out chronic infection

## 2015-08-28 ENCOUNTER — Telehealth: Payer: Self-pay | Admitting: Obstetrics and Gynecology

## 2015-08-31 ENCOUNTER — Other Ambulatory Visit: Payer: Self-pay | Admitting: Obstetrics and Gynecology

## 2015-08-31 DIAGNOSIS — L7682 Other postprocedural complications of skin and subcutaneous tissue: Secondary | ICD-10-CM

## 2015-08-31 MED ORDER — HYDROCODONE-ACETAMINOPHEN 5-325 MG PO TABS: 1.0000 | ORAL_TABLET | Freq: Four times a day (QID) | ORAL | Status: DC | PRN

## 2015-08-31 NOTE — Telephone Encounter (Signed)
Prior authorization for lidoderm patches filled out, pt may need to be tried on cox II inhibitors first.  refil of HC 5/325 x 120 tabs done

## 2015-08-31 NOTE — Telephone Encounter (Signed)
Pt aware of refill and PA in process.

## 2015-09-05 ENCOUNTER — Ambulatory Visit (INDEPENDENT_AMBULATORY_CARE_PROVIDER_SITE_OTHER): Payer: Medicaid Other | Admitting: Obstetrics and Gynecology

## 2015-09-05 ENCOUNTER — Encounter: Payer: Self-pay | Admitting: Obstetrics and Gynecology

## 2015-09-05 VITALS — BP 120/80 | Ht 66.0 in | Wt 292.0 lb

## 2015-09-05 DIAGNOSIS — R208 Other disturbances of skin sensation: Secondary | ICD-10-CM

## 2015-09-05 DIAGNOSIS — L7682 Other postprocedural complications of skin and subcutaneous tissue: Secondary | ICD-10-CM

## 2015-09-05 MED ORDER — HYDROCODONE-ACETAMINOPHEN 10-325 MG PO TABS
1.0000 | ORAL_TABLET | Freq: Four times a day (QID) | ORAL | Status: DC | PRN
Start: 2015-09-05 — End: 2015-09-26

## 2015-09-05 MED ORDER — DICLOFENAC SODIUM 75 MG PO TBEC: 75.0000 mg | DELAYED_RELEASE_TABLET | Freq: Two times a day (BID) | ORAL | Status: DC

## 2015-09-05 NOTE — Progress Notes (Signed)
Patient ID: Barbara Jennings, female   DOB: 05-27-1981, 34 y.o.   MRN: [id number] Pt here today for a follow up visit. Pt states that she is still having the pain all the way across her stomach. Pt states that the pain is not getting any better.

## 2015-09-05 NOTE — Progress Notes (Signed)
This chart was scribed for Barbara Burrow, MD by Jarvis Morgan, ED Scribe. This patient was seen in room 2 and the patient's care was started at 10:26 AM.   [facility] Visit  Patient name: BREEZE BERRINGER MRN [id number]  Date of birth: [birth year]  CC & HPI:  CHYNA KNEECE is a 34 y.o. female presenting today for follow up of LLQ s/p abdomianl supracervical hysterectomy in January 2016. She states the pain is radiating across her entire stomach. She reports the pain is not getting any better. She denies any problems with bowel function. Pt has taken Vicodin with moderate relief for the pain.She denies any erythema to the abdomen.   ROS:  10 Systems reviewed and all are negative for acute change except as noted in the HPI.   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 02/02/2014    Had ascus with +HPV will get colpo   Other and unspecified ovarian cyst 02/06/2014   Hx of migraines 02/09/2014    Has ?aura, will rx POP   Vulvar abscess 03/09/2014    I&D 3/9 in ER at [facility] was rx'd bactrim ds x 10 days   Abnormal uterine bleeding (AUB) 10/11/2014   Dysmenorrhea 10/11/2014   Thickened endometrium 10/18/2014   Ovarian cyst 10/18/2014                              Surgical Hx:    Past Surgical History  Procedure Laterality Date   Cesarean section     Ovarian cyst removal     Appendectomy     Cholecystectomy     Dilation and curettage of uterus     Laparoscopic supracervical hysterectomy N/A 01/02/2015    Procedure: ATTEMPTED  LAPAROSCOPIC SUPRACERVICAL HYSTERECTOMY CONVERTED TO OPEN AT 4098;  Surgeon: Barbara Burrow, MD;  Location: [facility];  Service: Gynecology;  Laterality: N/A;   Supracervical abdominal hysterectomy N/A 01/02/2015    Procedure: HYSTERECTOMY SUPRACERVICAL ABDOMINAL;  Surgeon: Barbara Burrow, MD;  Location: [facility];  Service: Gynecology;  Laterality: N/A;   Abdominal hysterectomy     Scar revision N/A 06/26/2015    Procedure: ABDOMINAL SCAR REVISION;  Surgeon: Barbara Burrow, MD;  Location: [facility];  Service: Gynecology;  Laterality: N/A;   Medications: Reviewed & Updated - see associated section                       Current outpatient prescriptions:    HYDROcodone-acetaminophen (NORCO/VICODIN) 5-325 MG per tablet, Take 1-2 tablets by mouth every 6 (six) hours as needed for moderate pain or severe pain., Disp: 120 tablet, Rfl: 0   ibuprofen (ADVIL,MOTRIN) 600 MG tablet, Take 600 mg by mouth every 6 (six) hours as needed., Disp: , Rfl:    amitriptyline (ELAVIL) 25 MG tablet, Take 1 tablet (25 mg total) by mouth at bedtime. (Patient not taking: Reported on 09/05/2015), Disp: 30  tablet, Rfl: 3   [DISCONTINUED] citalopram (CELEXA) 20 MG tablet, Take 20 mg by mouth daily.  , Disp: , Rfl:    Social History: Reviewed -  reports that she has been smoking Cigarettes.  She has a 6 pack-year smoking history. She has never used smokeless tobacco.  Objective Findings:  Vitals: Blood pressure 120/80, height 5\' 6"  (1.676 m), weight 292 lb (132.45 kg).  Physical Examination:  General appearance - alert, well appearing, and in no distress Abdomen - soft, nondistended, no masses or organomegaly. tenderness noted to superficial light touch in area surrounding incision and extends 1-2 inches from incision to the center. DOES NOT CROSS THE MIDLINE Skin - normal coloration and turgor, no rashes, no suspicious skin lesions noted   Assessment & Plan:   A:  1. Incisional pain s/p hysterectomy 2. Chronic abd wall  pain   P:  1. Check on prior auth with Lidoderm patches 2. Prescribe Voltaren 75 bid 3. Refill hydrocodone prescription at 10/325 x 90 ref 0 4. d/c Elavil 5. Attempt to arrange referral to [facility] for second opinion  I personally performed the services described in this documentation, which was SCRIBED in my presence. The recorded information has been reviewed and considered accurate. It has been edited as necessary during review. Barbara Burrow, MD

## 2015-09-10 ENCOUNTER — Emergency Department (HOSPITAL_COMMUNITY): Payer: Medicaid Other

## 2015-09-10 ENCOUNTER — Emergency Department (HOSPITAL_COMMUNITY)
Admission: EM | Admit: 2015-09-10 | Discharge: 2015-09-10 | Disposition: A | Discharge status: Home / Self Care | Payer: Medicaid Other | Attending: Emergency Medicine | Admitting: Emergency Medicine

## 2015-09-10 ENCOUNTER — Encounter (HOSPITAL_COMMUNITY): Payer: Self-pay

## 2015-09-10 DIAGNOSIS — Y998 Other external cause status: Secondary | ICD-10-CM | POA: Diagnosis not present

## 2015-09-10 DIAGNOSIS — J45909 Unspecified asthma, uncomplicated: Secondary | ICD-10-CM | POA: Diagnosis not present

## 2015-09-10 DIAGNOSIS — Z791 Long term (current) use of non-steroidal anti-inflammatories (NSAID): Secondary | ICD-10-CM | POA: Insufficient documentation

## 2015-09-10 DIAGNOSIS — S93401A Sprain of unspecified ligament of right ankle, initial encounter: Secondary | ICD-10-CM | POA: Diagnosis not present

## 2015-09-10 DIAGNOSIS — Y9389 Activity, other specified: Secondary | ICD-10-CM | POA: Diagnosis not present

## 2015-09-10 DIAGNOSIS — S99911A Unspecified injury of right ankle, initial encounter: Secondary | ICD-10-CM | POA: Diagnosis present

## 2015-09-10 DIAGNOSIS — Y9289 Other specified places as the place of occurrence of the external cause: Secondary | ICD-10-CM | POA: Insufficient documentation

## 2015-09-10 DIAGNOSIS — W1839XA Other fall on same level, initial encounter: Secondary | ICD-10-CM | POA: Insufficient documentation

## 2015-09-10 DIAGNOSIS — Z8659 Personal history of other mental and behavioral disorders: Secondary | ICD-10-CM | POA: Insufficient documentation

## 2015-09-10 DIAGNOSIS — Z8619 Personal history of other infectious and parasitic diseases: Secondary | ICD-10-CM | POA: Diagnosis not present

## 2015-09-10 DIAGNOSIS — Z8742 Personal history of other diseases of the female genital tract: Secondary | ICD-10-CM | POA: Insufficient documentation

## 2015-09-10 DIAGNOSIS — Z8669 Personal history of other diseases of the nervous system and sense organs: Secondary | ICD-10-CM | POA: Insufficient documentation

## 2015-09-10 DIAGNOSIS — Z720 Tobacco use: Secondary | ICD-10-CM | POA: Diagnosis not present

## 2015-09-10 MED ORDER — TRAMADOL HCL 50 MG PO TABS: 50.0000 mg | ORAL_TABLET | Freq: Four times a day (QID) | ORAL | Status: DC | PRN

## 2015-09-10 MED ORDER — IBUPROFEN 600 MG PO TABS: 600.0000 mg | ORAL_TABLET | Freq: Four times a day (QID) | ORAL | Status: DC | PRN

## 2015-09-10 NOTE — ED Provider Notes (Signed)
CSN: [id number]     Arrival date & time 09/10/15  0935 History   This chart was scribed for non-physician practitioner, Burgess Amor, PA-C working with Azalia Bilis, MD, by Jarvis Morgan, ED Scribe. This patient was seen in room APFT21/APFT21 and the patient's care was started at 12:04 PM.    Chief Complaint  Patient presents with  . Ankle Pain     The history is provided by the patient. No language interpreter was used.    HPI Comments: Barbara Jennings is a 34 y.o. female with no pertinent medical history who presents to the Emergency Department complaining of constant, mild, right ankle pain onset this morning. She states her right ankle gave out when she fell over a rail road tie this morning. She reports associated swelling to the right ankle. Pt is ambulatory but endorses she is having to walk with a limp. She has not taken any meds PTA. She has followed up with an orthopedist in the past, Dr. Hilda Lias. Pt denies any other injuries.She denies any numbness, weakness or sensation loss.  PCP:  [facility], Dr. Renette Butters  Past Medical History  Diagnosis Date  . Asthma   . Anxiety   . Panic attack   . Depression   . Migraine   . Abnormal Pap smear   . HSV-2 (herpes simplex virus 2) infection   . Vaginal Pap smear, abnormal   . Unspecified symptom associated with female genital organs 01/27/2014  . 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 Other and unspecified ovarian cyst 02/06/2014  . Hx of migraines 02/09/2014    Has ?aura, will rx POP  . Vulvar abscess 03/09/2014    I&D 3/9 in ER at [facility] was rx'd bactrim ds x 10 days  . Abnormal uterine bleeding (AUB) 10/11/2014  . Dysmenorrhea 10/11/2014  . Thickened endometrium 10/18/2014  . Ovarian cyst 10/18/2014   Past Surgical History  Procedure Laterality Date  . Cesarean section    . Ovarian cyst removal    . Appendectomy     . Cholecystectomy    . Dilation and curettage of uterus    . Laparoscopic supracervical hysterectomy N/A 01/02/2015    Procedure: ATTEMPTED LAPAROSCOPIC SUPRACERVICAL HYSTERECTOMY CONVERTED TO OPEN AT 4098;  Surgeon: Tilda Burrow, MD;  Location: [facility];  Service: Gynecology;  Laterality: N/A;  . Supracervical abdominal hysterectomy N/A 01/02/2015    Procedure: HYSTERECTOMY SUPRACERVICAL ABDOMINAL;  Surgeon: Tilda Burrow, MD;  Location: [facility];  Service: Gynecology;  Laterality: N/A;  . Abdominal hysterectomy    . Scar revision N/A 06/26/2015    Procedure: ABDOMINAL SCAR REVISION;  Surgeon: Tilda Burrow, MD;  Location: [facility];  Service: Gynecology;  Laterality: N/A;   Family History  Problem Relation Age of Onset  . Ulcers Father   . Cancer Maternal Aunt     breast   . Diabetes Maternal Grandmother   . Diabetes Maternal Grandfather   . Alzheimer's disease Paternal Grandmother   . Diabetes Maternal Aunt   . Cancer Paternal Aunt     cervical   Social History  Substance Use Topics  . Smoking status: Current Every Day Smoker -- 0.50 packs/day for 12 years    Types: Cigarettes  . Smokeless tobacco: Never Used  . Alcohol Use: Yes     Comment: socially   OB History    Gravida Para Term  Preterm AB TAB SAB Ectopic Multiple Living   7 2 1 1 5  5   1      Review of Systems  Musculoskeletal: Positive for myalgias, joint swelling, arthralgias and gait problem (antalgic).  Skin: Negative for wound.  Neurological: Negative for weakness and numbness.      Allergies  Coconut flavor  Home Medications   Prior to Admission medications   Medication Sig Start Date End Date Taking? Authorizing Provider  diclofenac (VOLTAREN) 75 MG EC tablet Take 1 tablet (75 mg total) by mouth 2 (two) times daily with a meal. 09/05/15  Yes Tilda Burrow, MD  HYDROcodone-acetaminophen (NORCO) 10-325 MG per tablet Take 1 tablet by mouth every 6 (six) hours as needed. Patient taking differently: Take  1 tablet by mouth every 6 (six) hours as needed for moderate pain.  09/05/15  Yes Tilda Burrow, MD  traMADol (ULTRAM) 50 MG tablet Take 1 tablet (50 mg total) by mouth every 6 (six) hours as needed. 09/10/15   Burgess Amor, PA-C   Triage Vitals: BP 116/58 mmHg  Pulse 75  Temp(Src) 98 F (36.7 C) (Oral)  Resp 18  Ht 5\' 6"  (1.676 m)  Wt 290 lb (131.543 kg)  BMI 46.83 kg/m2  SpO2 100%  LMP  (LMP Unknown)  Physical Exam  Constitutional: She appears well-developed and well-nourished.  HENT:  Head: Normocephalic and atraumatic.  Neck: Normal range of motion.  Cardiovascular: Normal rate and intact distal pulses.  Exam reveals no decreased pulses.   Pulses:      Dorsalis pedis pulses are 2+ on the right side, and 2+ on the left side.       Posterior tibial pulses are 2+ on the right side, and 2+ on the left side.  Pulses equal bilaterally  Musculoskeletal: She exhibits edema and tenderness.       Right ankle: She exhibits decreased range of motion and swelling. She exhibits no ecchymosis and normal pulse. Tenderness. Lateral malleolus tenderness found. No head of 5th metatarsal and no proximal fibula tenderness found. Achilles tendon normal.  Mild edema right lateral malleolus.  Dorsalis pedal pulse intact, distal cap refill less than 2 seconds.    Neurological: She is alert. She has normal strength. She displays normal reflexes. No sensory deficit.  Skin: Skin is warm, dry and intact.  Psychiatric: She has a normal mood and affect.  Nursing note and vitals reviewed.   ED Course  Procedures (including critical care time)  DIAGNOSTIC STUDIES: Oxygen Saturation is 100% on RA, normal by my interpretation.    COORDINATION OF CARE: 10:08 AM-Pt advised of plan for treatment and pt agrees. Will order imaging of right ankle.  12:06 PM-       Labs Review Labs Reviewed - No data to display  Imaging Review Dg Ankle Complete Right  09/10/2015   CLINICAL DATA:  Acute right ankle  twisting injury today with right pain and swelling. Initial encounter.  EXAM: RIGHT ANKLE - COMPLETE 3+ VIEW  COMPARISON:  05/25/2014  FINDINGS: There is no evidence of acute fracture, subluxation or dislocation.  Mild lateral soft tissue swelling is noted.  No focal bony lesions are present.  The joint spaces are unremarkable.  IMPRESSION: Soft tissue swelling without bony abnormality.   Electronically Signed   By: Harmon Pier M.D.   On: 09/10/2015 10:39   I have personally reviewed and evaluated these images and lab results as part of my medical decision-making.   EKG Interpretation None  MDM   Final diagnoses:  Ankle sprain, right, initial encounter     Radiological studies were viewed, interpreted and considered during the medical decision making and disposition process. I agree with radiologists reading.  Results were also discussed with patient. Pt was placed in ASO, crutches provided, RICE, tramadol prescribed, pt to continue taking her diclofenac.  Prn f/u with pcp if sx not improving over the next week.  I personally performed the services described in this documentation, which was scribed in my presence. The recorded information has been reviewed and is accurate.   Burgess Amor, PA-C 09/10/15 2107  Azalia Bilis, MD 09/11/15 [phone]

## 2015-09-10 NOTE — ED Notes (Signed)
Pt reports r ankle "gave out" and she fell over a rail road tie.  Pt c/o pain to r ankle.  Swelling noted.  Pt ambulatory with limp.  Pedal pulse present, pt able to wiggle toes, cap refill wnl.

## 2015-09-10 NOTE — Discharge Instructions (Signed)

## 2015-09-18 ENCOUNTER — Other Ambulatory Visit: Payer: Self-pay | Admitting: Obstetrics and Gynecology

## 2015-09-18 NOTE — Telephone Encounter (Signed)
She is not infected by our best impression.

## 2015-09-20 ENCOUNTER — Encounter (HOSPITAL_COMMUNITY): Payer: Self-pay | Admitting: Emergency Medicine

## 2015-09-20 ENCOUNTER — Emergency Department (HOSPITAL_COMMUNITY)
Admission: EM | Admit: 2015-09-20 | Discharge: 2015-09-20 | Disposition: A | Discharge status: Home / Self Care | Payer: Medicaid Other | Attending: Emergency Medicine | Admitting: Emergency Medicine

## 2015-09-20 DIAGNOSIS — Z791 Long term (current) use of non-steroidal anti-inflammatories (NSAID): Secondary | ICD-10-CM | POA: Diagnosis not present

## 2015-09-20 DIAGNOSIS — Z8619 Personal history of other infectious and parasitic diseases: Secondary | ICD-10-CM | POA: Diagnosis not present

## 2015-09-20 DIAGNOSIS — G43919 Migraine, unspecified, intractable, without status migrainosus: Secondary | ICD-10-CM | POA: Diagnosis present

## 2015-09-20 DIAGNOSIS — Z8742 Personal history of other diseases of the female genital tract: Secondary | ICD-10-CM | POA: Insufficient documentation

## 2015-09-20 DIAGNOSIS — J45909 Unspecified asthma, uncomplicated: Secondary | ICD-10-CM | POA: Insufficient documentation

## 2015-09-20 DIAGNOSIS — Z8659 Personal history of other mental and behavioral disorders: Secondary | ICD-10-CM | POA: Diagnosis not present

## 2015-09-20 DIAGNOSIS — Z720 Tobacco use: Secondary | ICD-10-CM | POA: Insufficient documentation

## 2015-09-20 MED ORDER — SODIUM CHLORIDE 0.9 % IV BOLUS (SEPSIS)
1000.0000 mL | Freq: Once | INTRAVENOUS | Status: AC
  Administered 2015-09-20: 1000 mL via INTRAVENOUS

## 2015-09-20 MED ORDER — PROMETHAZINE HCL 25 MG/ML IJ SOLN
12.5000 mg | Freq: Once | INTRAMUSCULAR | Status: AC
Start: 2015-09-20 — End: 2015-09-20
  Administered 2015-09-20: 12.5 mg via INTRAVENOUS
  Filled 2015-09-20: qty 1

## 2015-09-20 MED ORDER — SODIUM CHLORIDE 0.9 % IV SOLN
INTRAVENOUS | Status: DC
  Administered 2015-09-20: 21:00:00 via INTRAVENOUS

## 2015-09-20 MED ORDER — DIPHENHYDRAMINE HCL 50 MG/ML IJ SOLN
25.0000 mg | Freq: Once | INTRAMUSCULAR | Status: AC
  Administered 2015-09-20: 25 mg via INTRAVENOUS
  Filled 2015-09-20: qty 1

## 2015-09-20 MED ORDER — DEXAMETHASONE SODIUM PHOSPHATE 4 MG/ML IJ SOLN
10.0000 mg | Freq: Once | INTRAMUSCULAR | Status: AC
  Administered 2015-09-20: 10 mg via INTRAVENOUS
  Filled 2015-09-20: qty 3

## 2015-09-20 NOTE — Discharge Instructions (Signed)
Migraine Headache A migraine headache is very bad, throbbing pain on one or both sides of your head. Talk to your doctor about what things may bring on (trigger) your migraine headaches. HOME CARE  Only take medicines as told by your doctor.  Lie down in a dark, quiet room when you have a migraine.  Keep a journal to find out if certain things bring on migraine headaches. For example, write down:  What you eat and drink.  How much sleep you get.  Any change to your diet or medicines.  Lessen how much alcohol you drink.  Quit smoking if you smoke.  Get enough sleep.  Lessen any stress in your life.  Keep lights dim if bright lights bother you or make your migraines worse. GET HELP RIGHT AWAY IF:   Your migraine becomes really bad.  You have a fever.  You have a stiff neck.  You have trouble seeing.  Your muscles are weak, or you lose muscle control.  You lose your balance or have trouble walking.  You feel like you will pass out (faint), or you pass out.  You have really bad symptoms that are different than your first symptoms. MAKE SURE YOU:   Understand these instructions.  Will watch your condition.  Will get help right away if you are not doing well or get worse. Document Released: 09/23/2008 Document Revised: 03/08/2012 Document Reviewed: 08/22/2013 [facility] Patient Information 2015 [place], [place]. This information is not intended to replace advice given to you by your health care provider. Make sure you discuss any questions you have with your health care provider.  The home and rest. Work note provided. Stay in a dark room. Medications given tonight her long-acting and migraine headache should be resolved sometime tomorrow. Return for any new or worse symptoms.

## 2015-09-20 NOTE — ED Provider Notes (Signed)
CSN: [id number]     Arrival date & time 09/20/15  1930 History  This chart was scribed for Vanetta Mulders, MD by Budd Palmer, ED Scribe. This patient was seen in room APA09/APA09 and the patient's care was started at 8:59 PM.     Chief Complaint  Patient presents with  . Migraine   The history is provided by the patient. No language interpreter was used.   HPI Comments: Barbara Jennings is a 34 y.o. female with a PMHx of migraine who presents to the Emergency Department complaining of migraine onset 3 days ago. She currently rates her pain as an 8/10. She reports associated left-sided throbbing HA, n/v, photophobia, blurry vision and intermittent numbness of the right arm. She notes that her usual migraines are similar, but do not last this long. She states that migraine cocktails generally only work for 1-2 days for her. She has NKDA. Pt denies fever.  Past Medical History  Diagnosis Date  . Asthma   . Anxiety   . Panic attack   . Depression   . Migraine   . Abnormal Pap smear   . HSV-2 (herpes simplex virus 2) infection   . Vaginal Pap smear, abnormal   . Unspecified symptom associated with female genital organs 01/27/2014  . 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 Other and unspecified ovarian cyst 02/06/2014  . Hx of migraines 02/09/2014    Has ?aura, will rx POP  . Vulvar abscess 03/09/2014    I&D 3/9 in ER at [facility] was rx'd bactrim ds x 10 days  . Abnormal uterine bleeding (AUB) 10/11/2014  . Dysmenorrhea 10/11/2014  . Thickened endometrium 10/18/2014  . Ovarian cyst 10/18/2014   Past Surgical History  Procedure Laterality Date  . Cesarean section    . Ovarian cyst removal    . Appendectomy    . Cholecystectomy    . Dilation and curettage of uterus    . Laparoscopic supracervical hysterectomy N/A 01/02/2015    Procedure: ATTEMPTED LAPAROSCOPIC SUPRACERVICAL HYSTERECTOMY  CONVERTED TO OPEN AT 4098;  Surgeon: Tilda Burrow, MD;  Location: [facility];  Service: Gynecology;  Laterality: N/A;  . Supracervical abdominal hysterectomy N/A 01/02/2015    Procedure: HYSTERECTOMY SUPRACERVICAL ABDOMINAL;  Surgeon: Tilda Burrow, MD;  Location: [facility];  Service: Gynecology;  Laterality: N/A;  . Abdominal hysterectomy    . Scar revision N/A 06/26/2015    Procedure: ABDOMINAL SCAR REVISION;  Surgeon: Tilda Burrow, MD;  Location: [facility];  Service: Gynecology;  Laterality: N/A;   Family History  Problem Relation Age of Onset  . Ulcers Father   . Cancer Maternal Aunt     breast   . Diabetes Maternal Grandmother   . Diabetes Maternal Grandfather   . Alzheimer's disease Paternal Grandmother   . Diabetes Maternal Aunt   . Cancer Paternal Aunt     cervical   Social History  Substance Use Topics  . Smoking status: Current Every Day Smoker -- 0.50 packs/day for 12 years    Types: Cigarettes  . Smokeless tobacco: Never Used  . Alcohol Use: Yes     Comment: socially   OB History    Gravida Para Term Preterm AB TAB SAB Ectopic Multiple Living   Review of Systems  Constitutional: Negative for fever and chills.  HENT: Negative for rhinorrhea and sore throat.   Eyes: Positive for photophobia and visual disturbance.  Respiratory: Negative for cough and shortness of breath.   Cardiovascular: Negative for chest pain and leg swelling.  Gastrointestinal: Positive for nausea and vomiting. Negative for abdominal pain and diarrhea.  Genitourinary: Negative for dysuria.  Musculoskeletal: Negative for back pain.  Skin: Negative for rash.  Neurological: Positive for numbness and headaches.  Hematological: Does not bruise/bleed easily.  Psychiatric/Behavioral: Negative for confusion.    Allergies  Coconut flavor  Home Medications   Prior to Admission medications   Medication Sig Start Date End Date Taking? Authorizing Provider  diclofenac (VOLTAREN)  75 MG EC tablet Take 1 tablet (75 mg total) by mouth 2 (two) times daily with a meal. 09/05/15  Yes Tilda Burrow, MD  HYDROcodone-acetaminophen (NORCO) 10-325 MG per tablet Take 1 tablet by mouth every 6 (six) hours as needed. Patient taking differently: Take 1 tablet by mouth every 6 (six) hours as needed for moderate pain.  09/05/15  Yes Tilda Burrow, MD  traMADol (ULTRAM) 50 MG tablet Take 1 tablet (50 mg total) by mouth every 6 (six) hours as needed. Patient not taking: Reported on 09/20/2015 09/10/15   Burgess Amor, PA-C   BP 107/55 mmHg  Pulse 65  Temp(Src) 98.2 F (36.8 C) (Oral)  Resp 18  Ht  (1.676 m)  Wt 282 lb (127.914 kg)  BMI 45.54 kg/m2  SpO2 96%  LMP  (LMP Unknown) Physical Exam  Constitutional: She is oriented to person, place, and time. She appears well-developed and well-nourished.  HENT:  Head: Normocephalic and atraumatic.  Mouth/Throat: Oropharynx is clear and moist.  Eyes: Conjunctivae and EOM are normal. Right eye exhibits no discharge. Left eye exhibits no discharge. No scleral icterus.  Cardiovascular: Normal rate, regular rhythm and normal heart sounds.   Pulmonary/Chest: Effort normal and breath sounds normal. No respiratory distress.  Abdominal: Soft. Bowel sounds are normal. There is no tenderness.  Musculoskeletal: She exhibits no edema.  No swelling in the ankles  Neurological: She is alert and oriented to person, place, and time. No cranial nerve deficit. She exhibits normal muscle tone. Coordination normal.  Skin: Skin is warm and dry. No rash noted. She is not diaphoretic. No erythema.  Psychiatric: She has a normal mood and affect.  Nursing note and vitals reviewed.   ED Course  Procedures  DIAGNOSTIC STUDIES: Oxygen Saturation is 100% on RA, normal by my interpretation.    COORDINATION OF CARE: 9:04 PM - Discussed plans to order a migraine cocktail. Pt advised of plan for treatment and pt agrees.  Labs Review Labs Reviewed - No data to  display  Imaging Review No results found. I have personally reviewed and evaluated these images and lab results as part of my medical decision-making.   EKG Interpretation None      MDM   Final diagnoses:  Intractable migraine, unspecified migraine type    Patient with known history of migraines. Patient with a headache for the past 3 days. Typical for her migraines but just lasting longer than usual. Patient treated with migraine cocktail here Decadron Benadryl and Phenergan IV fluids. Patient with some improvement in the headache. Patient will be discharged home. For rest in a dark room and headache should resolve over the next 24 hours.  Patient nontoxic no acute distress.   I personally performed the services described in this documentation, which was scribed in my presence. The recorded information has been  reviewed and is accurate.    Vanetta Mulders, MD 09/20/15 [phone]

## 2015-09-20 NOTE — ED Notes (Signed)
Patient complaining of "migraine" x 3 days with vomiting.

## 2015-09-26 ENCOUNTER — Encounter: Payer: Self-pay | Admitting: Obstetrics and Gynecology

## 2015-09-26 ENCOUNTER — Ambulatory Visit (INDEPENDENT_AMBULATORY_CARE_PROVIDER_SITE_OTHER): Payer: Medicaid Other | Admitting: Obstetrics and Gynecology

## 2015-09-26 VITALS — BP 118/78 | Ht 66.0 in | Wt 290.0 lb

## 2015-09-26 DIAGNOSIS — R109 Unspecified abdominal pain: Secondary | ICD-10-CM

## 2015-09-26 MED ORDER — HYDROCODONE-ACETAMINOPHEN 10-325 MG PO TABS: 1.0000 | ORAL_TABLET | Freq: Four times a day (QID) | ORAL | Status: DC | PRN

## 2015-09-26 NOTE — Progress Notes (Signed)
Patient ID: Barbara Jennings, female   DOB: July 22, 1981, 34 y.o.   MRN: [id number] Pt here today for follow up. Pt states that she was supposed to discuss being sent to [facility] per Dr. Emelda Fear. Pt states that she is till in a lot of pain and it is not getting better. Pt states that she can't do much at home and she is getting depressed.

## 2015-09-26 NOTE — Progress Notes (Addendum)
Patient ID: Barbara Jennings, female   DOB: Mar 01, 1981, 34 y.o.   MRN: [id number]  By signing my name below, I, Barbara Jennings, attest that this documentation has been prepared under the direction and in the presence of Barbara Burrow, MD. Electronically Signed: Jarvis Jennings, ED Scribe. 09/26/2015. 12:24 PM.   [facility] Clinic Visit  Patient name: Barbara Jennings MRN [id number]  Date of birth: 1981-12-11  CC & HPI:  Barbara Jennings is a 34 y.o. female presenting today for follow up of LLQ s/p abdominal supracervical hysterectomy in January 2016 Following the abdominal supracervical hysterectomy with preservation of ovaries, the patient experienced left lower quadrant pain which appeared to be abdominal wall pain, with increased pain on the abdominal wall was tightened. Additionally we did a trigger point injection above the old cesarean scar and it seemed to make pain go away temporarily. We then took the patient back to the operating room for wide excision of the old scar, and the patient was found to have a totally normal-appearing fascial layer. I could not identify any fibrosis to remove so we simply excise the old subcutaneous fibrosis and skin edges. This made absolutely no difference in her pain and if anything the pain is getting worse. There was no evidence of hernia, and review of CT of the abdomen performed since the surgery did not suggest any herniation. There was a postoperative seroma after the excision of the old scar that required aspiration. Gram stain was essentially negative. The culture suggested a staph species that this was considered a contaminant due to overnight storage of the fluid in the office before culture could be obtained. There's been no redness or erythema to suggest infection in the area  . The pain has not gotten any better. She has been taking Vicodin for the pain with moderate relief. Pt is here to discuss with Dr. Emelda Jennings to be referred to [facility], or [facility]  for gyn pain. Pt reports she has begun to get depressed because she has not been able to get much done at home and has also lost her job due to the pain. Patient has maintained amazingly good spirits despite this difficult year. At this point I am requesting a second opinion and will seek this through [facility] pain clinic or [facility]. Currently patient is on Vicodin daily   ROS:  10 Systems reviewed and all are negative for acute change except as noted in the HPI.  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 02/02/2014    Had ascus with +HPV will get colpo   Other and unspecified ovarian cyst 02/06/2014   Hx of migraines 02/09/2014    Has ?aura, will rx POP   Vulvar abscess 03/09/2014    I&D 3/9 in ER at [facility] was rx'd bactrim ds x 10 days   Abnormal uterine bleeding (AUB) 10/11/2014   Dysmenorrhea 10/11/2014   Thickened endometrium 10/18/2014   Ovarian cyst 10/18/2014  Surgical Hx:    Past Surgical History  Procedure Laterality Date   Cesarean section     Ovarian cyst removal     Appendectomy     Cholecystectomy     Dilation and curettage of uterus     Laparoscopic supracervical hysterectomy N/A 01/02/2015    Procedure: ATTEMPTED LAPAROSCOPIC SUPRACERVICAL HYSTERECTOMY CONVERTED TO OPEN AT 1610;  Surgeon: Barbara Burrow, MD;  Location: [facility];  Service: Gynecology;  Laterality: N/A;   Supracervical abdominal hysterectomy N/A 01/02/2015    Procedure: HYSTERECTOMY SUPRACERVICAL ABDOMINAL;  Surgeon: Barbara Burrow, MD;  Location: [facility];   Service: Gynecology;  Laterality: N/A;   Abdominal hysterectomy     Scar revision N/A 06/26/2015    Procedure: ABDOMINAL SCAR REVISION;  Surgeon: Barbara Burrow, MD;  Location: [facility];  Service: Gynecology;  Laterality: N/A;   Medications: Reviewed & Updated - see associated section                       Current outpatient prescriptions:    diclofenac (VOLTAREN) 75 MG EC tablet, Take 1 tablet (75 mg total) by mouth 2 (two) times daily with a meal., Disp: 60 tablet, Rfl: 2   HYDROcodone-acetaminophen (NORCO) 10-325 MG per tablet, Take 1 tablet by mouth every 6 (six) hours as needed. (Patient not taking: Reported on 09/26/2015), Disp: 90 tablet, Rfl: 0   [DISCONTINUED] citalopram (CELEXA) 20 MG tablet, Take 20 mg by mouth daily.  , Disp: , Rfl:    Social History: Reviewed -  reports that she has been smoking Cigarettes.  She has a 6 pack-year smoking history. She has never used smokeless tobacco.  Objective Findings:  Vitals: Blood pressure 118/78, height  (1.676 m), weight 290 lb (131.543 kg).  Physical Examination: Not done-15 minutes discussion only   Assessment & Plan:   A:  1. Chronic LLQ s/p abdominal supracervical hysterectomy, felt musculoskeletal wall in origin with no improvement after wide excision of skin and subcutaneous tissue  P:  1. Will refer to Dr. Sherral Hammers gyn pain clinic [facility] Addendum: Unable to get appointment at [facility] will pursue [facility] options    I personally performed the services described in this documentation, which was SCRIBED in my presence. The recorded information has been reviewed and considered accurate. It has been edited as necessary during review. Barbara Burrow, MD

## 2015-10-03 ENCOUNTER — Telehealth: Payer: Self-pay | Admitting: Obstetrics and Gynecology

## 2015-10-03 NOTE — Telephone Encounter (Signed)
Pt needed to know if Dr. Emelda Fear has filled out paper for her seatbelt ticket and if he had done the referral for the pain management.

## 2015-10-06 ENCOUNTER — Encounter (HOSPITAL_COMMUNITY): Payer: Self-pay | Admitting: Emergency Medicine

## 2015-10-06 ENCOUNTER — Emergency Department (HOSPITAL_COMMUNITY): Payer: Medicaid Other

## 2015-10-06 ENCOUNTER — Emergency Department (HOSPITAL_COMMUNITY)
Admission: EM | Admit: 2015-10-06 | Discharge: 2015-10-06 | Disposition: A | Discharge status: Home / Self Care | Payer: Medicaid Other | Attending: Emergency Medicine | Admitting: Emergency Medicine

## 2015-10-06 DIAGNOSIS — R109 Unspecified abdominal pain: Secondary | ICD-10-CM | POA: Diagnosis present

## 2015-10-06 DIAGNOSIS — J45909 Unspecified asthma, uncomplicated: Secondary | ICD-10-CM | POA: Diagnosis not present

## 2015-10-06 DIAGNOSIS — R197 Diarrhea, unspecified: Secondary | ICD-10-CM | POA: Diagnosis not present

## 2015-10-06 DIAGNOSIS — Z720 Tobacco use: Secondary | ICD-10-CM | POA: Diagnosis not present

## 2015-10-06 DIAGNOSIS — F329 Major depressive disorder, single episode, unspecified: Secondary | ICD-10-CM | POA: Diagnosis not present

## 2015-10-06 DIAGNOSIS — F410 Panic disorder [episodic paroxysmal anxiety] without agoraphobia: Secondary | ICD-10-CM | POA: Diagnosis not present

## 2015-10-06 DIAGNOSIS — Z8619 Personal history of other infectious and parasitic diseases: Secondary | ICD-10-CM | POA: Insufficient documentation

## 2015-10-06 DIAGNOSIS — G43909 Migraine, unspecified, not intractable, without status migrainosus: Secondary | ICD-10-CM | POA: Diagnosis not present

## 2015-10-06 DIAGNOSIS — N390 Urinary tract infection, site not specified: Secondary | ICD-10-CM | POA: Insufficient documentation

## 2015-10-06 LAB — CBC WITH DIFFERENTIAL/PLATELET
BASOS ABS: 0.1 10*3/uL (ref 0.0–0.1)
Basophils Relative: 1 %
Eosinophils Absolute: 0.2 10*3/uL (ref 0.0–0.7)
Eosinophils Relative: 2 %
HCT: 38.6 % (ref 36.0–46.0)
HEMOGLOBIN: 13.4 g/dL (ref 12.0–15.0)
Lymphocytes Relative: 29 %
Lymphs Abs: 2.7 10*3/uL (ref 0.7–4.0)
MCH: 29.6 pg (ref 26.0–34.0)
MCHC: 34.7 g/dL (ref 30.0–36.0)
MCV: 85.4 fL (ref 78.0–100.0)
Monocytes Absolute: 0.5 10*3/uL (ref 0.1–1.0)
Monocytes Relative: 6 %
Neutro Abs: 5.8 10*3/uL (ref 1.7–7.7)
Neutrophils Relative %: 62 %
Platelets: 306 10*3/uL (ref 150–400)
RBC: 4.52 MIL/uL (ref 3.87–5.11)
RDW: 13.7 % (ref 11.5–15.5)
WBC: 9.2 10*3/uL (ref 4.0–10.5)

## 2015-10-06 LAB — COMPREHENSIVE METABOLIC PANEL
ALK PHOS: 57 U/L (ref 38–126)
ALT: 69 U/L — AB (ref 14–54)
AST: 49 U/L — ABNORMAL HIGH (ref 15–41)
Albumin: 4.4 g/dL (ref 3.5–5.0)
Anion gap: 10 (ref 5–15)
BILIRUBIN TOTAL: 1 mg/dL (ref 0.3–1.2)
BUN: 7 mg/dL (ref 6–20)
CO2: 24 mmol/L (ref 22–32)
CREATININE: 0.63 mg/dL (ref 0.44–1.00)
Calcium: 8.8 mg/dL — ABNORMAL LOW (ref 8.9–10.3)
Chloride: 106 mmol/L (ref 101–111)
GFR calc Af Amer: >60 mL/min (ref >60)
Glucose, Bld: 97 mg/dL (ref 65–99)
Potassium: 3.1 mmol/L — ABNORMAL LOW (ref 3.5–5.1)
Sodium: 140 mmol/L (ref 135–145)
Total Protein: 7.6 g/dL (ref 6.5–8.1)

## 2015-10-06 LAB — LIPASE, BLOOD: LIPASE: 21 U/L — AB (ref 22–51)

## 2015-10-06 LAB — URINALYSIS, ROUTINE W REFLEX MICROSCOPIC
Glucose, UA: NEGATIVE mg/dL
Hgb urine dipstick: NEGATIVE
Ketones, ur: 15 mg/dL — AB
Leukocytes, UA: NEGATIVE
Nitrite: POSITIVE — AB
UROBILINOGEN UA: 1 mg/dL (ref 0.0–1.0)
pH: 6 (ref 5.0–8.0)

## 2015-10-06 LAB — URINE MICROSCOPIC-ADD ON

## 2015-10-06 MED ORDER — CIPROFLOXACIN IN D5W 400 MG/200ML IV SOLN
400.0000 mg | Freq: Once | INTRAVENOUS | Status: AC
  Administered 2015-10-06: 400 mg via INTRAVENOUS
  Filled 2015-10-06: qty 200

## 2015-10-06 MED ORDER — ONDANSETRON HCL 4 MG/2ML IJ SOLN
4.0000 mg | Freq: Once | INTRAMUSCULAR | Status: AC
  Administered 2015-10-06: 4 mg via INTRAVENOUS
  Filled 2015-10-06: qty 2

## 2015-10-06 MED ORDER — PROMETHAZINE HCL 25 MG/ML IJ SOLN
12.5000 mg | Freq: Once | INTRAMUSCULAR | Status: AC
  Administered 2015-10-06: 12.5 mg via INTRAVENOUS
  Filled 2015-10-06: qty 1

## 2015-10-06 MED ORDER — POTASSIUM CHLORIDE CRYS ER 20 MEQ PO TBCR
20.0000 meq | EXTENDED_RELEASE_TABLET | Freq: Once | ORAL | Status: AC
  Administered 2015-10-06: 20 meq via ORAL
  Filled 2015-10-06: qty 1

## 2015-10-06 MED ORDER — HYDROCODONE-ACETAMINOPHEN 10-325 MG PO TABS
1.0000 | ORAL_TABLET | Freq: Once | ORAL | Status: AC
  Administered 2015-10-06: 1 via ORAL
  Filled 2015-10-06: qty 1

## 2015-10-06 MED ORDER — KETOROLAC TROMETHAMINE 30 MG/ML IJ SOLN
30.0000 mg | Freq: Once | INTRAMUSCULAR | Status: AC
  Administered 2015-10-06: 30 mg via INTRAVENOUS
  Filled 2015-10-06: qty 1

## 2015-10-06 MED ORDER — PROMETHAZINE HCL 25 MG PO TABS: 25.0000 mg | ORAL_TABLET | Freq: Four times a day (QID) | ORAL | Status: DC | PRN

## 2015-10-06 MED ORDER — SODIUM CHLORIDE 0.9 % IV SOLN: Freq: Once | INTRAVENOUS | Status: DC

## 2015-10-06 MED ORDER — ONDANSETRON HCL 4 MG/2ML IJ SOLN: 4.0000 mg | Freq: Once | INTRAMUSCULAR | Status: DC

## 2015-10-06 MED ORDER — SODIUM CHLORIDE 0.9 % IV BOLUS (SEPSIS)
1000.0000 mL | Freq: Once | INTRAVENOUS | Status: AC
  Administered 2015-10-06: 1000 mL via INTRAVENOUS

## 2015-10-06 MED ORDER — CIPROFLOXACIN HCL 500 MG PO TABS: 500.0000 mg | ORAL_TABLET | Freq: Two times a day (BID) | ORAL | Status: DC

## 2015-10-06 NOTE — ED Notes (Signed)
Pt reports emesis,diarrhea,abdominal pain x5 days. nad noted.

## 2015-10-06 NOTE — ED Provider Notes (Signed)
CSN: [id number]     Arrival date & time 10/06/15  1754 History   First MD Initiated Contact with Patient 10/06/15 1803     Chief Complaint  Patient presents with  . Emesis     (Consider location/radiation/quality/duration/timing/severity/associated sxs/prior Treatment) HPI Comments: 34 y.o. Female with history of anxiety, depression, migraines, asthma presents for vomiting.  The patient states that over the last 5 days she has not been able to eat or drink anything without vomiting.  She has been having cramping abdominal pain and diarrhea over this time as well.  She said that these symptoms started after she and her fiance broke up and that she feels that a large part of this is stress related.  She denies fever, chills.  No sick contacts.  No recent travel.  No recent antibiotic use.  Patient is a 34 y.o. female presenting with vomiting.  Emesis Associated symptoms: abdominal pain and diarrhea   Associated symptoms: no chills, no headaches and no myalgias     Past Medical History  Diagnosis Date  . Asthma   . Anxiety   . Panic attack   . Depression   . Migraine   . Abnormal Pap smear   . HSV-2 (herpes simplex virus 2) infection   . Vaginal Pap smear, abnormal   . Unspecified symptom associated with female genital organs 01/27/2014  . 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 Other and unspecified ovarian cyst 02/06/2014  . Hx of migraines 02/09/2014    Has ?aura, will rx POP  . Vulvar abscess 03/09/2014    I&D 3/9 in ER at [facility] was rx'd bactrim ds x 10 days  . Abnormal uterine bleeding (AUB) 10/11/2014  . Dysmenorrhea 10/11/2014  . Thickened endometrium 10/18/2014  . Ovarian cyst 10/18/2014   Past Surgical History  Procedure Laterality Date  . Cesarean section    . Ovarian cyst removal    . Appendectomy    . Cholecystectomy    . Dilation and curettage of uterus    .  Laparoscopic supracervical hysterectomy N/A 01/02/2015    Procedure: ATTEMPTED LAPAROSCOPIC SUPRACERVICAL HYSTERECTOMY CONVERTED TO OPEN AT 0865;  Surgeon: Tilda Burrow, MD;  Location: [facility];  Service: Gynecology;  Laterality: N/A;  . Supracervical abdominal hysterectomy N/A 01/02/2015    Procedure: HYSTERECTOMY SUPRACERVICAL ABDOMINAL;  Surgeon: Tilda Burrow, MD;  Location: [facility];  Service: Gynecology;  Laterality: N/A;  . Abdominal hysterectomy    . Scar revision N/A 06/26/2015    Procedure: ABDOMINAL SCAR REVISION;  Surgeon: Tilda Burrow, MD;  Location: [facility];  Service: Gynecology;  Laterality: N/A;   Family History  Problem Relation Age of Onset  . Ulcers Father   . Cancer Maternal Aunt     breast   . Diabetes Maternal Grandmother   . Diabetes Maternal Grandfather   . Alzheimer's disease Paternal Grandmother   . Diabetes Maternal Aunt   . Cancer Paternal Aunt     cervical   Social History  Substance Use Topics  . Smoking status: Current Every Day Smoker -- 0.50 packs/day for 12 years    Types: Cigarettes  . Smokeless tobacco: Never Used  . Alcohol Use: Yes     Comment: socially   OB History    Gravida Para Term Preterm AB TAB SAB Ectopic Multiple Living   1  Review of Systems  Constitutional: Negative for fever, chills and fatigue.  HENT: Negative for congestion, postnasal drip and rhinorrhea.   Eyes: Negative for pain and redness.  Respiratory: Negative for cough, chest tightness and shortness of breath.   Cardiovascular: Negative for chest pain and palpitations.  Gastrointestinal: Positive for nausea, vomiting, abdominal pain and diarrhea.  Genitourinary: Negative for dysuria, urgency and frequency.  Musculoskeletal: Negative for myalgias and back pain.  Skin: Negative for rash.  Neurological: Negative for dizziness, light-headedness and headaches.  Hematological: Does not bruise/bleed easily.      Allergies  Coconut flavor  Home  Medications   Prior to Admission medications   Medication Sig Start Date End Date Taking? Authorizing Provider  ALPRAZolam Prudy Feeler) 0.5 MG tablet Take 0.5 mg by mouth every 8 (eight) hours as needed for anxiety.   Yes Historical Provider, MD  HYDROcodone-acetaminophen (NORCO) 10-325 MG tablet Take 1 tablet by mouth every 6 (six) hours as needed. 09/26/15  Yes Tilda Burrow, MD  diclofenac (VOLTAREN) 75 MG EC tablet Take 1 tablet (75 mg total) by mouth 2 (two) times daily with a meal. Patient not taking: Reported on 10/06/2015 09/05/15   Tilda Burrow, MD   BP 156/86 mmHg  Pulse 84  Temp(Src) 98 F (36.7 C) (Oral)  Resp 20  Ht 5\' 6"  (1.676 m)  Wt 292 lb (132.45 kg)  BMI 47.15 kg/m2  SpO2 100%  LMP  (LMP Unknown) Physical Exam  Constitutional: She is oriented to person, place, and time. She appears well-developed and well-nourished. No distress.  HENT:  Head: Normocephalic and atraumatic.  Right Ear: External ear normal.  Left Ear: External ear normal.  Nose: Nose normal.  Mouth/Throat: Oropharynx is clear and moist. No oropharyngeal exudate.  Eyes: EOM are normal. Pupils are equal, round, and reactive to light.  Neck: Normal range of motion. Neck supple.  Cardiovascular: Normal rate, regular rhythm, normal heart sounds and intact distal pulses.   No murmur heard. Pulmonary/Chest: Effort normal. No respiratory distress. She has no wheezes. She has no rales.  Abdominal: Soft. She exhibits no distension. There is no tenderness.  Benign abdominal examination with complaint of mild discomfort diffusely over abdomen when palpated  Musculoskeletal: Normal range of motion. She exhibits no edema or tenderness.  Neurological: She is alert and oriented to person, place, and time.  Skin: Skin is warm and dry. No rash noted. She is not diaphoretic.  Psychiatric: Her mood appears anxious. She expresses no homicidal and no suicidal ideation. She expresses no suicidal plans and no homicidal plans.   Tearful during history  Vitals reviewed.   ED Course  Procedures (including critical care time) Labs Review Labs Reviewed  URINALYSIS, ROUTINE W REFLEX MICROSCOPIC (NOT AT [facility]) - Abnormal; Notable for the following:    APPearance CLOUDY (*)    Specific Gravity, Urine >1.030 (*)    Bilirubin Urine MODERATE (*)    Ketones, ur 15 (*)    Protein, ur TRACE (*)    Nitrite POSITIVE (*)    All other components within normal limits  URINE MICROSCOPIC-ADD ON - Abnormal; Notable for the following:    Squamous Epithelial / LPF FEW (*)    Bacteria, UA MANY (*)    All other components within normal limits  CBC WITH DIFFERENTIAL/PLATELET  COMPREHENSIVE METABOLIC PANEL  LIPASE, BLOOD  URINALYSIS, ROUTINE W REFLEX MICROSCOPIC (NOT AT [facility])    Imaging Review No results found. I have personally reviewed and evaluated these images and lab  results as part of my medical decision-making.   EKG Interpretation None      MDM  Patient seen and evaluated in stable condition.  Benign examination.  Laboratory results unremarkable other than UA consistent with infection and patient given dose of IV Cipro.  Xray unremarkable.   Patient tolerated PO after antiemetics.  Patient well appearing with normal vitals, no leukocytosis.  Patient discharged home in stable condition with prescriptions for Cipro and Phenergan.  She was told to follow up outpatient with her PCP.  She also was told to seek help with her anxiety.  Patient denied suicidal or homicidal ideation. Final diagnoses:  None    1. UTI  2. Nausea and vomiting    Leta Baptist, MD 10/06/15 2201

## 2015-10-06 NOTE — Discharge Instructions (Signed)
Urinary Tract Infection  Take your medications as prescribed.  Follow up with your primary care doctor to ensure you ar improving.   Urinary tract infections (UTIs) can develop anywhere along your urinary tract. Your urinary tract is your body's drainage system for removing wastes and extra water. Your urinary tract includes two kidneys, two ureters, a bladder, and a urethra. Your kidneys are a pair of bean-shaped organs. Each kidney is about the size of your fist. They are located below your ribs, one on each side of your spine. CAUSES Infections are caused by microbes, which are microscopic organisms, including fungi, viruses, and bacteria. These organisms are so small that they can only be seen through a microscope. Bacteria are the microbes that most commonly cause UTIs. SYMPTOMS  Symptoms of UTIs may vary by age and gender of the patient and by the location of the infection. Symptoms in young women typically include a frequent and intense urge to urinate and a painful, burning feeling in the bladder or urethra during urination. Older women and men are more likely to be tired, shaky, and weak and have muscle aches and abdominal pain. A fever may mean the infection is in your kidneys. Other symptoms of a kidney infection include pain in your back or sides below the ribs, nausea, and vomiting. DIAGNOSIS To diagnose a UTI, your caregiver will ask you about your symptoms. Your caregiver will also ask you to provide a urine sample. The urine sample will be tested for bacteria and white blood cells. White blood cells are made by your body to help fight infection. TREATMENT  Typically, UTIs can be treated with medication. Because most UTIs are caused by a bacterial infection, they usually can be treated with the use of antibiotics. The choice of antibiotic and length of treatment depend on your symptoms and the type of bacteria causing your infection. HOME CARE INSTRUCTIONS  If you were prescribed  antibiotics, take them exactly as your caregiver instructs you. Finish the medication even if you feel better after you have only taken some of the medication.  Drink enough water and fluids to keep your urine clear or pale yellow.  Avoid caffeine, tea, and carbonated beverages. They tend to irritate your bladder.  Empty your bladder often. Avoid holding urine for long periods of time.  Empty your bladder before and after sexual intercourse.  After a bowel movement, women should cleanse from front to back. Use each tissue only once. SEEK MEDICAL CARE IF:   You have back pain.  You develop a fever.  Your symptoms do not begin to resolve within 3 days. SEEK IMMEDIATE MEDICAL CARE IF:   You have severe back pain or lower abdominal pain.  You develop chills.  You have nausea or vomiting.  You have continued burning or discomfort with urination. MAKE SURE YOU:   Understand these instructions.  Will watch your condition.  Will get help right away if you are not doing well or get worse.   This information is not intended to replace advice given to you by your health care provider. Make sure you discuss any questions you have with your health care provider.   Document Released: 09/24/2005 Document Revised: 09/05/2015 Document Reviewed: 01/23/2012 Elsevier Interactive Patient Education [employer]

## 2015-10-08 ENCOUNTER — Telehealth: Payer: Self-pay | Admitting: Obstetrics and Gynecology

## 2015-10-16 ENCOUNTER — Telehealth: Payer: Self-pay | Admitting: *Deleted

## 2015-10-17 ENCOUNTER — Other Ambulatory Visit: Payer: Self-pay | Admitting: Obstetrics and Gynecology

## 2015-10-17 MED ORDER — HYDROCODONE-ACETAMINOPHEN 10-325 MG PO TABS: 1.0000 | ORAL_TABLET | Freq: Four times a day (QID) | ORAL | Status: DC | PRN

## 2015-10-18 NOTE — Telephone Encounter (Signed)
Patient informed that I am actively trying to arrange referral

## 2015-10-22 ENCOUNTER — Telehealth: Payer: Self-pay | Admitting: Obstetrics and Gynecology

## 2015-10-26 ENCOUNTER — Ambulatory Visit (INDEPENDENT_AMBULATORY_CARE_PROVIDER_SITE_OTHER): Payer: Medicaid Other | Admitting: Obstetrics and Gynecology

## 2015-10-26 ENCOUNTER — Encounter: Payer: Self-pay | Admitting: Obstetrics and Gynecology

## 2015-10-26 VITALS — BP 118/76 | Ht 66.0 in | Wt 276.0 lb

## 2015-10-26 DIAGNOSIS — R1032 Left lower quadrant pain: Secondary | ICD-10-CM | POA: Diagnosis not present

## 2015-10-26 DIAGNOSIS — R102 Pelvic and perineal pain: Secondary | ICD-10-CM | POA: Insufficient documentation

## 2015-10-26 DIAGNOSIS — R109 Unspecified abdominal pain: Secondary | ICD-10-CM

## 2015-10-26 NOTE — Progress Notes (Signed)
Patient ID: Barbara Jennings, female   DOB: 07/08/1981, 34 y.o.   MRN: [id number] Barbara Jennings,Antonie Borjon V, MD Please see the note below by Ronney Lionsuzanne Le, which incorporates the details of the visit. The patient's records for the surgery and f/u visits was faxed [place] [facility] [place] for their review.

## 2015-10-26 NOTE — Progress Notes (Signed)
[facility] Visit  Patient name: Barbara Jennings MRN [id number]  Date of birth: 14-Mar-1981  CC & HPI:  Barbara Jennings is a 34 y.o. female presenting today for follow-up referral to pain clinic for her persistent incisional pain s/p hysterectomy and abdominal wall pain.   ROS:  A complete review of systems was obtained and all systems are negative except as noted in the HPI and PMH.    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 02/02/2014    Had ascus with +HPV will get colpo   Other and unspecified ovarian cyst 02/06/2014   Hx of migraines 02/09/2014    Has ?aura, will rx POP   Vulvar abscess 03/09/2014    I&D 3/9 in ER at [facility] was rx'd bactrim ds x 10 days   Abnormal uterine bleeding (AUB) 10/11/2014   Dysmenorrhea 10/11/2014   Thickened endometrium 10/18/2014   Ovarian cyst 10/18/2014                              Surgical Hx:    Past Surgical History  Procedure Laterality Date   Cesarean section     Ovarian cyst removal     Appendectomy     Cholecystectomy     Dilation and curettage of uterus     Laparoscopic supracervical hysterectomy N/A 01/02/2015    Procedure: ATTEMPTED LAPAROSCOPIC SUPRACERVICAL HYSTERECTOMY CONVERTED TO OPEN AT 4098;  Surgeon: Tilda Burrow, MD;  Location: [facility];  Service: Gynecology;  Laterality: N/A;   Supracervical abdominal hysterectomy N/A 01/02/2015    Procedure: HYSTERECTOMY SUPRACERVICAL ABDOMINAL;  Surgeon: Tilda Burrow, MD;  Location: [facility];  Service: Gynecology;  Laterality: N/A;   Abdominal hysterectomy     Scar revision N/A 06/26/2015    Procedure: ABDOMINAL SCAR REVISION;  Surgeon: Tilda Burrow, MD;  Location: [facility];  Service: Gynecology;  Laterality: N/A;   Medications: Reviewed & Updated - see associated section                       Current outpatient prescriptions:    ALPRAZolam (XANAX) 0.5 MG tablet, Take 0.5 mg by mouth every 8 (eight) hours as needed for anxiety., Disp: , Rfl:    HYDROcodone-acetaminophen (NORCO) 10-325 MG tablet, Take 1 tablet by mouth every 6 (six) hours as needed., Disp: 120 tablet, Rfl: 0   promethazine (PHENERGAN) 25 MG tablet, Take 1 tablet (25 mg total) by mouth every 6 (six) hours as needed for nausea or vomiting. (Patient not taking: Reported on 10/26/2015), Disp: 12 tablet, Rfl: 0   [DISCONTINUED] citalopram (CELEXA) 20 MG tablet, Take 20 mg by mouth daily.  , Disp: , Rfl:    Social History: Reviewed -  reports that she has been smoking Cigarettes.  She has a 6 pack-year smoking history. She has never used smokeless tobacco.  Objective  Findings:  Vitals: Blood pressure 118/76, height 5\' 6"  (1.676 m), weight 276 lb (125.193 kg).  Physical Examination:  Patient here for discussion only.    Assessment & Plan:   A:  1. Follow-up visit for incisional pain and abdominal wall pain s/p hysterectomy.  P:  1. [facility]   By signing my name below, I, Ronney LionSuzanne Le, attest that this documentation has been prepared under the direction and in the presence of Tilda BurrowJohn Ferguson V, MD. Electronically Signed: Ronney LionSuzanne Le, ED Scribe. 10/26/2015. 12:58 PM.   I personally performed the services described in this documentation, which was SCRIBED in my presence. The recorded information has been reviewed and considered accurate. It has been edited as necessary during review. Tilda BurrowFERGUSON,JOHN V, MD   (scribe attestation statement)

## 2015-10-26 NOTE — Progress Notes (Signed)
Patient ID: Barbara Jennings, female   DOB: 04/30/1981, 34 y.o.   MRN: [id number] Pt here today for follow up. Pt to follow up on referral to pain clinic.

## 2015-11-14 ENCOUNTER — Telehealth: Payer: Self-pay | Admitting: Obstetrics and Gynecology

## 2015-11-15 ENCOUNTER — Telehealth: Payer: Self-pay | Admitting: *Deleted

## 2015-11-15 NOTE — Telephone Encounter (Signed)
Pt states called yesterday requesting refill on Hydrocodone for nerve pain, has not heard back from our office.  Pt states Dr. Emelda FearFerguson had referred her to the pain clinic, appt 12/10/2015 and she took her last Hydrocodone tablet this am.   Pt informed Dr. Emelda FearFerguson out of the office remainder of week would see if another provider would prescribe.

## 2015-11-15 NOTE — Telephone Encounter (Signed)
Pt called to check on the status of her pain medication, informed her that Dr. Despina HiddenEure has not responded to the message yet and it may be tomorrow before she hears back from us about it.  Pt verbalized understanding.

## 2015-11-16 ENCOUNTER — Telehealth: Payer: Self-pay | Admitting: Obstetrics & Gynecology

## 2015-11-16 MED ORDER — HYDROCODONE-ACETAMINOPHEN 10-325 MG PO TABS: 1.0000 | ORAL_TABLET | Freq: Four times a day (QID) | ORAL | Status: DC | PRN

## 2015-11-16 NOTE — Telephone Encounter (Signed)
Pt informed RX for Hydrocodone left at front desk for pick up.  

## 2015-11-16 NOTE — Telephone Encounter (Signed)
Pt's last few visits are reviewed  I will give pt #16 to get through the weekend and then she will need to contact Dr Emelda FearFerguson to see if continued meds are appropriate

## 2015-11-19 ENCOUNTER — Other Ambulatory Visit: Payer: Self-pay | Admitting: Obstetrics and Gynecology

## 2015-11-19 ENCOUNTER — Telehealth: Payer: Self-pay | Admitting: Obstetrics and Gynecology

## 2015-11-19 DIAGNOSIS — R1032 Left lower quadrant pain: Principal | ICD-10-CM

## 2015-11-19 DIAGNOSIS — G8929 Other chronic pain: Secondary | ICD-10-CM

## 2015-11-19 MED ORDER — HYDROCODONE-ACETAMINOPHEN 10-325 MG PO TABS: 1.0000 | ORAL_TABLET | Freq: Four times a day (QID) | ORAL | Status: DC | PRN

## 2015-11-19 NOTE — Telephone Encounter (Signed)
Pt aware that Rx was refilled per Dr.Ferguson and that she could come by the office and pick it up. Pt verbalized understanding.

## 2015-12-03 ENCOUNTER — Telehealth: Payer: Self-pay | Admitting: Obstetrics and Gynecology

## 2015-12-03 MED ORDER — HYDROCODONE-ACETAMINOPHEN 10-325 MG PO TABS: 1.0000 | ORAL_TABLET | Freq: Four times a day (QID) | ORAL | Status: DC | PRN

## 2015-12-03 NOTE — Telephone Encounter (Signed)
Pt called stating that she needs a refill of her hydrocodone. Pt states that she is completely out. She will be taking her last two this morning. Please contact pt

## 2015-12-03 NOTE — Telephone Encounter (Signed)
Dr. Emelda FearFerguson gave verbal to refill medication and he signed Rx. Pt aware to come by the office and pick Rx up.

## 2016-01-11 ENCOUNTER — Emergency Department (HOSPITAL_COMMUNITY)
Admission: EM | Admit: 2016-01-11 | Discharge: 2016-01-11 | Disposition: A | Discharge status: Home / Self Care | Payer: Medicaid Other | Attending: Emergency Medicine | Admitting: Emergency Medicine

## 2016-01-11 ENCOUNTER — Encounter (HOSPITAL_COMMUNITY): Payer: Self-pay | Admitting: *Deleted

## 2016-01-11 DIAGNOSIS — Z8679 Personal history of other diseases of the circulatory system: Secondary | ICD-10-CM | POA: Diagnosis not present

## 2016-01-11 DIAGNOSIS — Z8742 Personal history of other diseases of the female genital tract: Secondary | ICD-10-CM | POA: Insufficient documentation

## 2016-01-11 DIAGNOSIS — Y998 Other external cause status: Secondary | ICD-10-CM | POA: Insufficient documentation

## 2016-01-11 DIAGNOSIS — X58XXXA Exposure to other specified factors, initial encounter: Secondary | ICD-10-CM | POA: Insufficient documentation

## 2016-01-11 DIAGNOSIS — Y9389 Activity, other specified: Secondary | ICD-10-CM | POA: Diagnosis not present

## 2016-01-11 DIAGNOSIS — F410 Panic disorder [episodic paroxysmal anxiety] without agoraphobia: Secondary | ICD-10-CM | POA: Diagnosis not present

## 2016-01-11 DIAGNOSIS — F17210 Nicotine dependence, cigarettes, uncomplicated: Secondary | ICD-10-CM | POA: Diagnosis not present

## 2016-01-11 DIAGNOSIS — S86902A Unspecified injury of unspecified muscle(s) and tendon(s) at lower leg level, left leg, initial encounter: Secondary | ICD-10-CM | POA: Insufficient documentation

## 2016-01-11 DIAGNOSIS — J45909 Unspecified asthma, uncomplicated: Secondary | ICD-10-CM | POA: Diagnosis not present

## 2016-01-11 DIAGNOSIS — Y9289 Other specified places as the place of occurrence of the external cause: Secondary | ICD-10-CM | POA: Insufficient documentation

## 2016-01-11 DIAGNOSIS — F329 Major depressive disorder, single episode, unspecified: Secondary | ICD-10-CM | POA: Insufficient documentation

## 2016-01-11 DIAGNOSIS — S8992XA Unspecified injury of left lower leg, initial encounter: Secondary | ICD-10-CM | POA: Diagnosis present

## 2016-01-11 DIAGNOSIS — Z8619 Personal history of other infectious and parasitic diseases: Secondary | ICD-10-CM | POA: Diagnosis not present

## 2016-01-11 MED ORDER — HYDROCODONE-ACETAMINOPHEN 5-325 MG PO TABS: ORAL_TABLET | ORAL | Status: DC

## 2016-01-11 MED ORDER — IBUPROFEN 800 MG PO TABS: 800.0000 mg | ORAL_TABLET | Freq: Three times a day (TID) | ORAL | Status: DC

## 2016-01-11 MED ORDER — IBUPROFEN 800 MG PO TABS
800.0000 mg | ORAL_TABLET | Freq: Once | ORAL | Status: AC
  Administered 2016-01-11: 800 mg via ORAL
  Filled 2016-01-11: qty 1

## 2016-01-11 MED ORDER — HYDROCODONE-ACETAMINOPHEN 5-325 MG PO TABS
1.0000 | ORAL_TABLET | Freq: Once | ORAL | Status: AC
  Administered 2016-01-11: 1 via ORAL
  Filled 2016-01-11: qty 1

## 2016-01-11 NOTE — ED Provider Notes (Signed)
CSN: [id number]     Arrival date & time 01/11/16  0755 History   First MD Initiated Contact with Patient 01/11/16 [phone]     Chief Complaint  Patient presents with  . Leg Pain     (Consider location/radiation/quality/duration/timing/severity/associated sxs/prior Treatment) HPI   Barbara Jennings is a 35 y.o. female who presents to the Emergency Department complaining of sudden onset of left calf pain and swelling.  She states that she was playing with her daughter last evening, jumping up and down, when she felt and heard a "loud pop" to her left lower leg.  She had immediate pain and unable to bear weight due to her level of pain.  Symptoms are worsened with flexing her foot and squeezing her calf.  She has not tried any therapies for her pain.   She denies knee or ankle pain, numbness of the extremity or thigh pain.     Past Medical History  Diagnosis Date  . Asthma   . Anxiety   . Panic attack   . Depression   . Migraine   . Abnormal Pap smear   . HSV-2 (herpes simplex virus 2) infection   . Vaginal Pap smear, abnormal   . Unspecified symptom associated with female genital organs 01/27/2014  . 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 Other and unspecified ovarian cyst 02/06/2014  . Hx of migraines 02/09/2014    Has ?aura, will rx POP  . Vulvar abscess 03/09/2014    I&D 3/9 in ER at [facility] was rx'd bactrim ds x 10 days  . Abnormal uterine bleeding (AUB) 10/11/2014  . Dysmenorrhea 10/11/2014  . Thickened endometrium 10/18/2014  . Ovarian cyst 10/18/2014   Past Surgical History  Procedure Laterality Date  . Cesarean section    . Ovarian cyst removal    . Appendectomy    . Cholecystectomy    . Dilation and curettage of uterus    . Laparoscopic supracervical hysterectomy N/A 01/02/2015    Procedure: ATTEMPTED LAPAROSCOPIC SUPRACERVICAL HYSTERECTOMY CONVERTED TO OPEN AT 1191;  Surgeon:  Tilda Burrow, MD;  Location: [facility];  Service: Gynecology;  Laterality: N/A;  . Supracervical abdominal hysterectomy N/A 01/02/2015    Procedure: HYSTERECTOMY SUPRACERVICAL ABDOMINAL;  Surgeon: Tilda Burrow, MD;  Location: [facility];  Service: Gynecology;  Laterality: N/A;  . Abdominal hysterectomy    . Scar revision N/A 06/26/2015    Procedure: ABDOMINAL SCAR REVISION;  Surgeon: Tilda Burrow, MD;  Location: [facility];  Service: Gynecology;  Laterality: N/A;   Family History  Problem Relation Age of Onset  . Ulcers Father   . Cancer Maternal Aunt     breast   . Diabetes Maternal Grandmother   . Diabetes Maternal Grandfather   . Alzheimer's disease Paternal Grandmother   . Diabetes Maternal Aunt   . Cancer Paternal Aunt     cervical   Social History  Substance Use Topics  . Smoking status: Current Every Day Smoker -- 0.50 packs/day for 12 years    Types: Cigarettes  . Smokeless tobacco: Never Used  . Alcohol Use: Yes     Comment: socially   OB History    Gravida Para Term Preterm AB TAB SAB Ectopic Multiple Living   7 2 1 1 5  5   1      Review of Systems  Constitutional: Negative for fever and chills.  Musculoskeletal:  Positive for myalgias (left calf tenderness.) and joint swelling. Negative for back pain and arthralgias.  Skin: Negative for color change and wound.  Neurological: Negative for weakness.  All other systems reviewed and are negative.     Allergies  Coconut flavor  Home Medications   Prior to Admission medications   Medication Sig Start Date End Date Taking? Authorizing Provider  ALPRAZolam Prudy Feeler(XANAX) 0.5 MG tablet Take 0.5 mg by mouth every 8 (eight) hours as needed for anxiety.    Historical Provider, MD  HYDROcodone-acetaminophen (NORCO) 10-325 MG tablet Take 1 tablet by mouth every 6 (six) hours as needed for severe pain. 12/03/15   Tilda BurrowJohn Ferguson V, MD  promethazine (PHENERGAN) 25 MG tablet Take 1 tablet (25 mg total) by mouth every 6 (six) hours as  needed for nausea or vomiting. Patient not taking: Reported on 10/26/2015 10/06/15   Leta BaptistEmily Roe Nguyen, MD   BP 116/68 mmHg  Pulse 66  Temp(Src) 99 F (37.2 C) (Oral)  Resp 16  Ht 5\' 6"  (1.676 m)  Wt 123.378 kg  BMI 43.92 kg/m2  SpO2 99%  LMP  (LMP Unknown) Physical Exam  Constitutional: She is oriented to person, place, and time. She appears well-developed and well-nourished. No distress.  HENT:  Head: Normocephalic and atraumatic.  Cardiovascular: Normal rate and intact distal pulses.   Pulmonary/Chest: Effort normal and breath sounds normal.  Musculoskeletal: She exhibits edema and tenderness.       Left lower leg: She exhibits tenderness and swelling. She exhibits no bony tenderness.       Legs: Focal ttp of the posterior and medial calf.  Mild edema.  Achilles tendon is NT and appears intact.  Thompson test is negative.  DP pulse is brisk,distal sensation intact.  No erythema, bruising or bony deformity.  Left ankle and knee NT  Neurological: She is alert and oriented to person, place, and time. She exhibits normal muscle tone. Coordination normal.  Skin: Skin is warm and dry. No erythema.  Nursing note and vitals reviewed.   ED Course  Procedures (including critical care time) Labs Review Labs Reviewed - No data to display  Imaging Review No results found. I have personally reviewed and evaluated these images and lab results as part of my medical decision-making.    MDM   Final diagnoses:  Injury of muscle or tendon at lower leg level, left, initial encounter    Pt with sudden onset of left calf pain and swelling after jumping.  Likely muscle tear although Janee Mornhompson test is negative, I suspect the tear is partial.  NV and NS intact  ACE wrap applied, ice pack and crutches given.  Pt agrees to symptomatic tx and close orthopedic f/u.  Rx for ibuprofen and #15 vicodin    Pauline Ausammy Montgomery Rothlisberger, PA-C 01/11/16 0850  Eber HongBrian Miller, MD 01/11/16 [phone]

## 2016-01-11 NOTE — ED Notes (Signed)
Pt states she was jumping up and down last night and felt something pop to left lower leg. Sates unable to flex foot due to pain and pain with bearing weight.

## 2016-01-11 NOTE — ED Notes (Signed)
PA at bedside.

## 2016-03-24 ENCOUNTER — Other Ambulatory Visit: Payer: Self-pay | Admitting: Obstetrics and Gynecology

## 2016-03-24 ENCOUNTER — Telehealth: Payer: Self-pay | Admitting: *Deleted

## 2016-03-24 DIAGNOSIS — L7682 Other postprocedural complications of skin and subcutaneous tissue: Secondary | ICD-10-CM

## 2016-03-24 MED ORDER — HYDROCODONE-ACETAMINOPHEN 5-325 MG PO TABS: ORAL_TABLET | ORAL | Status: DC

## 2016-03-24 NOTE — Telephone Encounter (Signed)
Pt requesting a refill on her Hydrocodone for lower abdominal pain at old c-section scar. Pt states Dr. Emelda FearFerguson very familiar with her case and prescribed in the past. Pt does have an appt with Dr. Emelda FearFerguson 03/27/2016. Please advise.

## 2016-03-26 NOTE — Telephone Encounter (Signed)
Pt aware of Rx being in front office for her to pick up.

## 2016-03-27 ENCOUNTER — Encounter: Payer: Self-pay | Admitting: Obstetrics and Gynecology

## 2016-03-27 ENCOUNTER — Ambulatory Visit (INDEPENDENT_AMBULATORY_CARE_PROVIDER_SITE_OTHER): Payer: Medicaid Other | Admitting: Obstetrics and Gynecology

## 2016-03-27 VITALS — BP 120/74 | Ht 66.0 in | Wt 270.0 lb

## 2016-03-27 DIAGNOSIS — Z90710 Acquired absence of both cervix and uterus: Secondary | ICD-10-CM | POA: Diagnosis not present

## 2016-03-27 DIAGNOSIS — R1032 Left lower quadrant pain: Secondary | ICD-10-CM

## 2016-03-27 DIAGNOSIS — R208 Other disturbances of skin sensation: Secondary | ICD-10-CM

## 2016-03-27 DIAGNOSIS — L7682 Other postprocedural complications of skin and subcutaneous tissue: Secondary | ICD-10-CM

## 2016-03-27 MED ORDER — HYDROCODONE-ACETAMINOPHEN 10-325 MG PO TABS: 1.0000 | ORAL_TABLET | Freq: Four times a day (QID) | ORAL | Status: DC | PRN

## 2016-03-27 MED ORDER — PHENTERMINE HCL 37.5 MG PO CAPS: 37.5000 mg | ORAL_CAPSULE | ORAL | Status: DC

## 2016-03-27 NOTE — Progress Notes (Signed)
Patient ID: [place], female   DOB: 12/08/1981, 35 y.o.   MRN: [id number]   [facility] Visit  Patient name: Barbara Jennings MRN [id number]  Date of birth: Feb 03, 1981  CC & HPI:  Barbara Jennings is a 35 y.o. female presenting today for chronic, persistent, severe, constant  lower abdominal pain with left side being worse than right. Pt states the pain is so severe she wakes up in tears every morning. Associated Sx include intermittent numbness to BLE. Pt denies any modifying factors and notes bowel function has no impact on the pain.   ROS:  10 Systems reviewed and all are negative for acute change except as noted in the HPI.  Pertinent History Reviewed:   Reviewed: Significant for cesarean section, ovarian cyst removal, appendectomy, D&C of uterus.  Medical         Past Medical History  Diagnosis Date  . Asthma   . Anxiety   . Panic attack   . Depression   . Migraine   . Abnormal Pap smear   . HSV-2 (herpes simplex virus 2) infection   . Vaginal Pap smear, abnormal   . Unspecified symptom associated with female genital organs 01/27/2014  . 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 Other and unspecified ovarian cyst 02/06/2014  . Hx of migraines 02/09/2014    Has ?aura, will rx POP  . Vulvar abscess 03/09/2014    I&D 3/9 in ER at [facility] was rx'd bactrim ds x 10 days  . Abnormal uterine bleeding (AUB) 10/11/2014  . Dysmenorrhea 10/11/2014  . Thickened endometrium 10/18/2014  . Ovarian cyst 10/18/2014                              Surgical Hx:    Past Surgical History  Procedure Laterality Date  . Cesarean section    . Ovarian cyst removal    . Appendectomy    . Cholecystectomy    . Dilation and curettage of uterus    . Laparoscopic supracervical hysterectomy N/A 01/02/2015    Procedure: ATTEMPTED LAPAROSCOPIC SUPRACERVICAL HYSTERECTOMY CONVERTED TO OPEN AT 7829;  Surgeon:  Tilda Burrow, MD;  Location: [facility];  Service: Gynecology;  Laterality: N/A;  . Supracervical abdominal hysterectomy N/A 01/02/2015    Procedure: HYSTERECTOMY SUPRACERVICAL ABDOMINAL;  Surgeon: Tilda Burrow, MD;  Location: [facility];  Service: Gynecology;  Laterality: N/A;  . Abdominal hysterectomy    . Scar revision N/A 06/26/2015    Procedure: ABDOMINAL SCAR REVISION;  Surgeon: Tilda Burrow, MD;  Location: [facility];  Service: Gynecology;  Laterality: N/A;   Medications: Reviewed & Updated - see associated section                       Current outpatient prescriptions:  .  ALPRAZolam (XANAX) 0.5 MG tablet, Take 0.5 mg by mouth every 8 (eight) hours as needed for anxiety., Disp: , Rfl:  .  tiZANidine (ZANAFLEX) 4 MG tablet, Take 2 mg by mouth every 8 (eight) hours as needed., Disp: , Rfl:  .  [DISCONTINUED] citalopram (CELEXA) 20 MG tablet, Take 20 mg by mouth daily.  , Disp: , Rfl:    Social History: Reviewed -  reports that she has been smoking Cigarettes.  She has a 6 pack-year smoking history. She has  never used smokeless tobacco.  Objective Findings:  Vitals: Blood pressure 120/74, height 5\' 6"  (1.676 m), weight 270 lb (122.471 kg).  Physical Examination: Presents for discussion only.  Discussion with patient regarding chronic abd pain. At end of discussion, pt had opportunity to ask questions and has no further questions at this time.   Greater than 50% was spent in counseling and coordination of care with the patient. Total time greater than: 15 minutes  Assessment & Plan:   A: chronic incional pain LLQ s/p hysterectomy 1. Pain unrelieved by Neurontin. , it was d/c'd  P:  1. Refill hydrocodone-acetaminophen 10mg . q 6h. #120 . Will fill q month, til pt [facility] 2. [facility].  3. Rx for phentermine     By signing my name below, I, Marica OtterNusrat Rahman, attest that this documentation has been prepared under the direction and in the presence of  Christin BachJohn Magdeline Prange, MD. Electronically Signed: Marica OtterNusrat Rahman, ED Scribe. 03/27/2016. 12:06 PM.   I personally performed the services described in this documentation, which was SCRIBED in my presence. The recorded information has been reviewed and considered accurate. It has been edited as necessary during review. Tilda BurrowFERGUSON,Johathan Province V, MD

## 2016-03-27 NOTE — Progress Notes (Signed)
Patient ID: Barbara Jennings, female   DOB: 05/31/1981, 35 y.o.   MRN: [id number] Pt here today for lower abdominal pain. Pt states that the left side is worse than the right.

## 2016-04-24 ENCOUNTER — Ambulatory Visit (INDEPENDENT_AMBULATORY_CARE_PROVIDER_SITE_OTHER): Payer: Medicaid Other | Admitting: Obstetrics and Gynecology

## 2016-04-24 ENCOUNTER — Other Ambulatory Visit: Payer: Self-pay | Admitting: Obstetrics and Gynecology

## 2016-04-24 DIAGNOSIS — L7682 Other postprocedural complications of skin and subcutaneous tissue: Secondary | ICD-10-CM

## 2016-04-24 DIAGNOSIS — G5782 Other specified mononeuropathies of left lower limb: Secondary | ICD-10-CM

## 2016-04-24 MED ORDER — HYDROCODONE-ACETAMINOPHEN 10-325 MG PO TABS: 1.0000 | ORAL_TABLET | Freq: Four times a day (QID) | ORAL | Status: DC | PRN

## 2016-05-22 ENCOUNTER — Ambulatory Visit (INDEPENDENT_AMBULATORY_CARE_PROVIDER_SITE_OTHER): Payer: Medicaid Other | Admitting: Obstetrics and Gynecology

## 2016-05-22 ENCOUNTER — Encounter: Payer: Self-pay | Admitting: Obstetrics and Gynecology

## 2016-05-22 VITALS — BP 120/70 | Ht 66.0 in | Wt 269.0 lb

## 2016-05-22 DIAGNOSIS — Z90711 Acquired absence of uterus with remaining cervical stump: Secondary | ICD-10-CM | POA: Diagnosis not present

## 2016-05-22 DIAGNOSIS — R1032 Left lower quadrant pain: Secondary | ICD-10-CM

## 2016-05-22 DIAGNOSIS — R1030 Lower abdominal pain, unspecified: Secondary | ICD-10-CM | POA: Diagnosis not present

## 2016-05-22 MED ORDER — HYDROCODONE-ACETAMINOPHEN 10-325 MG PO TABS: 1.0000 | ORAL_TABLET | Freq: Four times a day (QID) | ORAL | Status: DC | PRN

## 2016-05-22 NOTE — Patient Instructions (Signed)
Please put a one half page summary together to deliver to the [facility] physicians. In this please include recurrent status what makes the pain worse what makes the pain better mentioning that standing completely erect is difficult, leaning forward helps, and any other signs of what makes the pain [place] the pain worse, what time of day it's worse habits affecting her bowel function habits affecting her normal activity, and mention whether the TENS unit helps

## 2016-05-22 NOTE — Progress Notes (Signed)
[place] AS SCHEDULEDPatient ID: Barbara Jennings M Shibuya, female   DOB: 05/10/1981, 35 y.o.   MRN: [id number]    [facility]  @DATE @            Patient name: Barbara Jennings M Dunleavy MRN [id number]  Date of birth: 01/20/1981  CC & HPI:  Barbara Jennings M Bayer is a 35 y.o. female presenting today for Follow-up of her chronic lower abdominal incision pain felt to be ilioinguinal nerve trigger point, on the left.  She now describes the pain is bilateral and constant. It is increased slightly by standing up completely straight and reduced slightly by leaning forward which is her normal position the same sitting lying down she wakes up with pain. She then [place] of sought consultation nerve block [facility] did not help the pain. She is currently able to get out of bed without using hydrocodone. Her mother gives her help with her 35-year-old.  ROS:  Review of Systems  Gastrointestinal: Positive for abdominal pain (chronic).  All other systems reviewed and are negative. I given her phentermine at her last visit trying to encourage her to keep her weight under control while in active. She did not start until just recently has remained minimally reduced   Pertinent History Reviewed:   Reviewed: Significant for abdominal hysterectomy, cesarean section Excision of scar tissue in the subcutaneous fatty tissue area by [facility] proximally 1 year ago for this same pain. It did not help at all, I did not explore the fascia at the time of that surgery as there were no visible abnormalities when I was at the level of the fascia. Of note the patient did not have laparoscopy at the time of that surgery, so I have some uncertainty as to whether there might be to abdominal adhesions that could be contributing to proceed pain since nerve injections did not reduce the pain [facility]         Past Medical History  Diagnosis Date  . Asthma   .  Anxiety   . Panic attack   . Depression   . Migraine   . Abnormal Pap smear   . HSV-2 (herpes simplex virus 2) infection   . Vaginal Pap smear, abnormal   . Unspecified symptom associated with female genital organs 01/27/2014  . 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 Other and unspecified ovarian cyst 02/06/2014  . Hx of migraines 02/09/2014    Has ?aura, will rx POP  . Vulvar abscess 03/09/2014    [facility] was rx'd bactrim ds x 10 days  . Abnormal uterine bleeding (AUB) 10/11/2014  . Dysmenorrhea 10/11/2014  . Thickened endometrium 10/18/2014  . Ovarian cyst 10/18/2014                              Surgical Hx:    Past Surgical History  Procedure Laterality Date  . Cesarean section    . Ovarian cyst removal    . Appendectomy    . Cholecystectomy    . Dilation and curettage of uterus    . Laparoscopic supracervical hysterectomy N/A 01/02/2015    Procedure: ATTEMPTED LAPAROSCOPIC SUPRACERVICAL HYSTERECTOMY CONVERTED TO OPEN AT [id number];  Surgeon: Tilda BurrowJohn Azavier Creson V, MD;  Location:  [facility];  Service: Gynecology;  Laterality: N/A;  . Supracervical abdominal hysterectomy N/A 01/02/2015    Procedure: HYSTERECTOMY SUPRACERVICAL ABDOMINAL;  Surgeon: Tilda Burrow, MD;  Location: [facility];  Service: Gynecology;  Laterality: N/A;  . Abdominal hysterectomy    . Scar revision N/A 06/26/2015    Procedure: ABDOMINAL SCAR REVISION;  Surgeon: Tilda Burrow, MD;  Location: [facility];  Service: Gynecology;  Laterality: N/A;   Medications: Reviewed & Updated - see associated section                       Current outpatient prescriptions:  .  ALPRAZolam (XANAX) 0.5 MG tablet, Take 0.5 mg by mouth every 8 (eight) hours as needed for anxiety., Disp: , Rfl:  .  phentermine 37.5 MG capsule, Take 1 capsule (37.5 mg total) by mouth every morning., Disp: 30 capsule, Rfl: 1 .  HYDROcodone-acetaminophen  (NORCO) 10-325 MG tablet, Take 1 tablet by mouth every 6 (six) hours as needed. (Patient not taking: Reported on 05/22/2016), Disp: 120 tablet, Rfl: 0 .  [DISCONTINUED] citalopram (CELEXA) 20 MG tablet, Take 20 mg by mouth daily.  , Disp: , Rfl:    Social History: Reviewed -  reports that she has been smoking Cigarettes.  She has a 6 pack-year smoking history. She has never used smokeless tobacco.  Objective Findings:  Vitals: Blood pressure 120/70, height  (1.676 m), weight 269 lb (122.018 kg).  Physical Examination: General appearance - alert, well appearing, and in no distress, oriented to person, place, and time, overweight and chronically ill appearing she appears somewhat sedated . Affect suspect Mental status - alert, oriented to person, place, and time, depressed mood, drowsy Eyes - pupils equal and reactive, extraocular eye movements intact Abdomen - scars from previous incisions are well healed. The patient has tenderness that seems to go across the midline. She reacts dramatically to even light touch across the midline. Deep palpation shows that the most sensitive area is approximately 12 cm below the naval 4 cm above the old incision and 4 cm to left to the midline. There is no herniation and when patient times her abdomen the pain is still present upon palpation Skin over the abdomen has multiple excoriated 1 cm lesions, patient reports that this happens "every summer when he gets hot Extremities - peripheral pulses normal, no pedal edema, no clubbing or cyanosis  I spent over 25 minutes with the visit with >than 50% spent in counseling and coordination of care.   Assessment & Plan:   A:  1. Chronic LLQ incisional pain s/p abdominal hysterectomy on 01/02/15, Status post excision of subcutaneous fibrosis on the left without improvement   P:  1. Continue hydrocodone 10 mg every 6 hours 2. Consult physical therapy for TENS unit 3. [facility] 4. Advised patient to put together a half page pain summary to hand to her providers at [facility] and supply to our office at follow-up visit 5.  By signing my name below, I, Doreatha Martin, attest that this documentation has been prepared under the direction and in the presence of Tilda Burrow, MD. Electronically Signed: Doreatha Martin, ED Scribe. 05/22/2016. 8:57 AM.  I personally performed the services described in this documentation, which was SCRIBED in my presence. The recorded information has been reviewed and considered accurate. It has been edited as necessary during review. Tilda Burrow, MD

## 2016-06-10 ENCOUNTER — Ambulatory Visit (HOSPITAL_COMMUNITY): Payer: Medicaid Other | Admitting: Physical Therapy

## 2016-06-23 ENCOUNTER — Other Ambulatory Visit (HOSPITAL_COMMUNITY)
Admission: RE | Admit: 2016-06-23 | Discharge: 2016-06-23 | Disposition: A | Discharge status: Home / Self Care | Payer: Medicaid Other | Source: Ambulatory Visit | Attending: Obstetrics and Gynecology | Admitting: Obstetrics and Gynecology

## 2016-06-23 ENCOUNTER — Encounter: Payer: Self-pay | Admitting: Obstetrics and Gynecology

## 2016-06-23 ENCOUNTER — Ambulatory Visit (HOSPITAL_COMMUNITY): Payer: Medicaid Other | Attending: Obstetrics and Gynecology | Admitting: Physical Therapy

## 2016-06-23 ENCOUNTER — Ambulatory Visit (INDEPENDENT_AMBULATORY_CARE_PROVIDER_SITE_OTHER): Payer: Medicaid Other | Admitting: Obstetrics and Gynecology

## 2016-06-23 VITALS — BP 110/70 | Ht 66.0 in | Wt 264.0 lb

## 2016-06-23 DIAGNOSIS — Z1151 Encounter for screening for human papillomavirus (HPV): Secondary | ICD-10-CM | POA: Diagnosis not present

## 2016-06-23 DIAGNOSIS — M792 Neuralgia and neuritis, unspecified: Secondary | ICD-10-CM | POA: Diagnosis present

## 2016-06-23 DIAGNOSIS — Z124 Encounter for screening for malignant neoplasm of cervix: Secondary | ICD-10-CM

## 2016-06-23 DIAGNOSIS — Z01411 Encounter for gynecological examination (general) (routine) with abnormal findings: Secondary | ICD-10-CM | POA: Diagnosis present

## 2016-06-23 DIAGNOSIS — R1032 Left lower quadrant pain: Secondary | ICD-10-CM | POA: Diagnosis not present

## 2016-06-23 DIAGNOSIS — R109 Unspecified abdominal pain: Secondary | ICD-10-CM

## 2016-06-23 DIAGNOSIS — R102 Pelvic and perineal pain: Secondary | ICD-10-CM

## 2016-06-23 DIAGNOSIS — M6281 Muscle weakness (generalized): Secondary | ICD-10-CM | POA: Diagnosis not present

## 2016-06-23 MED ORDER — PHENTERMINE HCL 37.5 MG PO CAPS: 37.5000 mg | ORAL_CAPSULE | ORAL | Status: DC

## 2016-06-23 MED ORDER — HYDROCODONE-ACETAMINOPHEN 10-325 MG PO TABS: 1.0000 | ORAL_TABLET | Freq: Four times a day (QID) | ORAL | Status: DC | PRN

## 2016-06-23 NOTE — Therapy (Signed)
[facility] [address] [place] [place], [place], [address] Phone: [phone]   Fax:  [phone]  Physical Therapy Evaluation  Patient Details  Name: Barbara Jennings MRN: [id number] Date of Birth: 03/27/1981 Referring Provider: Christin BachJohn Ferguson  Encounter Date: 06/23/2016      PT End of Session - 06/23/16 1030    Visit Number 1   Number of Visits 1   Authorization Type medicaid   PT Start Time 0950   PT Stop Time 1030   PT Time Calculation (min) 40 min   Activity Tolerance Patient tolerated treatment well   Behavior During Therapy [facility] for tasks assessed/performed      Past Medical History  Diagnosis Date  . Asthma   . Anxiety   . Panic attack   . Depression   . Migraine   . Abnormal Pap smear   . HSV-2 (herpes simplex virus 2) infection   . Vaginal Pap smear, abnormal   . Unspecified symptom associated with female genital organs 01/27/2014  . 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 Other and unspecified ovarian cyst 02/06/2014  . Hx of migraines 02/09/2014    Has ?aura, will rx POP  . Vulvar abscess 03/09/2014    [facility] bactrim ds x 10 days  . Abnormal uterine bleeding (AUB) 10/11/2014  . Dysmenorrhea 10/11/2014  . Thickened endometrium 10/18/2014  . Ovarian cyst 10/18/2014    Past Surgical History  Procedure Laterality Date  . Cesarean section    . Ovarian cyst removal    . Appendectomy    . Cholecystectomy    . Dilation and curettage of uterus    . Laparoscopic supracervical hysterectomy N/A 01/02/2015    Procedure: ATTEMPTED LAPAROSCOPIC SUPRACERVICAL HYSTERECTOMY CONVERTED TO OPEN AT [id number];  Surgeon: Tilda BurrowJohn Ferguson V, MD;  Location: [facility];  Service: Gynecology;  Laterality: N/A;  . Supracervical abdominal hysterectomy N/A 01/02/2015    Procedure: HYSTERECTOMY SUPRACERVICAL ABDOMINAL;  Surgeon: Tilda BurrowJohn  Ferguson V, MD;  Location: [facility];  Service: Gynecology;  Laterality: N/A;  . Abdominal hysterectomy    . Scar revision N/A 06/26/2015    Procedure: ABDOMINAL SCAR REVISION;  Surgeon: Tilda BurrowJohn Ferguson V, MD;  Location: [facility];  Service: Gynecology;  Laterality: N/A;    There were no vitals filed for this visit.       Subjective Assessment - 06/23/16 0959    Subjective Barbara Jennings states that she had a hysterectomhy two and a half  years ago and she has had pain in her left lower quadrant ever since.  She had a second surgery a year ago to try and reduce adhesions but there were not many adhesions and the pain remained the same.  She [place] once a month for her condition.  The patient is now being referred for a TENS UNIT    Pertinent History no issues    How long can you stand comfortably? an hour    How long can you walk comfortably? a mile three times a week    Currently in Pain? Yes   Pain Score 9    Pain Location Abdomen   Pain Orientation Right;Left   Pain Descriptors / Indicators Throbbing   Pain Onset More than a month ago   Pain Frequency Constant   Aggravating Factors  too much  lifiting   Pain Relieving Factors nothing    Effect of Pain on Daily Activities increases            [facility] PT Assessment - 06/23/16 0001    Assessment   Medical Diagnosis abdominal wall pain    Referring Provider Christin BachJohn Ferguson   Onset Date/Surgical Date 01/03/15   Next MD Visit 07/14/2016   Prior Therapy none   Precautions   Precautions None   Restrictions   Weight Bearing Restrictions No   Balance Screen   Has the patient fallen in the past 6 months No   Prior Function   Level of Independence Independent   Vocation Unemployed   Leisure walk    Cognition   Overall Cognitive Status Within Functional Limits for tasks assessed   Observation/Other Assessments   Observations scar well healed .               PT Education - 06/23/16 1029     Education provided Yes   Education Details TENS unit;  How to use; different modes and care for.   Person(s) Educated Patient   Methods Explanation;Demonstration;Verbal cues  unit has booklet with it    Comprehension Verbalized understanding;Returned demonstration          PT Short Term Goals - 06/23/16 1033    PT SHORT TERM GOAL #1   Title Pt to be independent in use and understanding of TENS unit   Time 1   Period Days   Status Achieved                  Plan - 06/23/16 1031    Clinical Impression Statement Barbara Jennings is a 35 yo who has had chronic pain since she had a hysterectomy in January of 2016.  She states she has been doing kegals, abdominal set without relief.  She is on opiod pain medication with limited relief.  She is now being referered for a TENS unit to try and decrease her dependency on pain medication.    Rehab Potential Good   PT Frequency 1x / week   PT Duration --  1 week due to insurance coverage.    PT Treatment/Interventions ADLs/Self Care Home Management;Patient/family education   PT Next Visit Plan n/a   Consulted and Agree with Plan of Care Patient      Patient will benefit from skilled therapeutic intervention in order to improve the following deficits and impairments:  Decreased activity tolerance, Pain, Obesity  Visit Diagnosis: Muscle weakness (generalized) - Plan: PT plan of care cert/re-cert  Neuralgia and neuritis, unspecified - Plan: PT plan of care cert/re-cert     Problem List Patient Active Problem List   Diagnosis Date Noted  . Combined abdominal and pelvic pain 10/26/2015  . Incisional pain s/p hysterectomy 06/26/2015  . Knee pain, chronic 09/12/2014  . Acute meniscal tear of knee 07/03/2014  . Dysplasia of cervix, unspecified 02/16/2014  . Hx of migraines 02/09/2014  . 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 Unspecified symptom associated with female  genital organs 01/27/2014  . HSV-2 (herpes simplex virus 2) infection 05/26/2013    Virgina Organynthia Russell, PT CLT [phone] 06/23/2016, 3:33 PM  [facility] [address] [place], [place], [address] Phone: [phone]   Fax:  [phone]  Name: Barbara Jennings MRN: [id number] Date of Birth: 04/14/1981

## 2016-06-23 NOTE — Progress Notes (Signed)
Patient ID: Barbara Jennings, female   DOB: 10/06/1981, 35 y.o.   MRN: [id number]    [facility]  @DATE @            Patient name: Barbara Jennings MRN [id number]  Date of birth: 03/12/1981  CC & HPI:  Barbara Jennings is a 35 y.o. female presenting today for f/u of pain medication refill for ongoing, chronic bilateral lower abdominal pain (L>>R), felt to be ilioinguinal nerve trigger point, on the left. Pt rates her abdominal pain at 10/10 with movement. She reports her pain is alleviated with rest. She is followed [place] as well [place] [facility] where she sought consultation for nerve block infiltration, which did not help the pain. She denies constipation, additional symptoms.   ROS:  Review of Systems  Gastrointestinal: Positive for abdominal pain (chronic). Negative for constipation.  All other systems reviewed and are negative.    Pertinent History Reviewed:   Reviewed: Significant for abdominal hysterectomy, cesarean section Excision of scar tissue in the subcutaneous fatty tissue area by J [facility] proximally 1 year ago for this same pain. It did not help at all, I did not explore the fascia at the time of that surgery as there were no visible abnormalities when I was at the level of the fascia. Of note the patient did not have laparoscopy at the time of that surgery, so I have some uncertainty as to whether there might be to abdominal adhesions that could be contributing to proceed pain since nerve injections did not reduce the pain when [facility]         Past Medical History  Diagnosis Date  . Asthma   . Anxiety   . Panic attack   . Depression   . Migraine   . Abnormal Pap smear   . HSV-2 (herpes simplex virus 2) infection   . Vaginal Pap smear, abnormal   . Unspecified symptom associated with female genital organs 01/27/2014  . Abnormal cervical Papanicolaou smear with positive human papilloma virus (HPV) DNA test 02/02/2014    Had ascus with +HPV will get  colpo  . Other and unspecified ovarian cyst 02/06/2014  . Hx of migraines 02/09/2014    Has ?aura, will rx POP  . Vulvar abscess 03/09/2014    [facility]'d bactrim ds x 10 days  . Abnormal uterine bleeding (AUB) 10/11/2014  . Dysmenorrhea 10/11/2014  . Thickened endometrium 10/18/2014  . Ovarian cyst 10/18/2014                              Surgical Hx:    Past Surgical History  Procedure Laterality Date  . Cesarean section    . Ovarian cyst removal    . Appendectomy    . Cholecystectomy    . Dilation and curettage of uterus    . Laparoscopic supracervical hysterectomy N/A 01/02/2015    Procedure: ATTEMPTED LAPAROSCOPIC SUPRACERVICAL HYSTERECTOMY CONVERTED TO OPEN AT [id number];  Surgeon: Tilda BurrowJohn Kaelah Hayashi V, MD;  Location: [facility];  Service: Gynecology;  Laterality: N/A;  . Supracervical abdominal hysterectomy N/A 01/02/2015    Procedure: HYSTERECTOMY SUPRACERVICAL ABDOMINAL;  Surgeon: Tilda BurrowJohn Katurah Karapetian V, MD;  Location: [facility];  Service: Gynecology;  Laterality: N/A;  . Abdominal hysterectomy    . Scar revision N/A 06/26/2015    Procedure: ABDOMINAL SCAR REVISION;  Surgeon: Tilda BurrowJohn Tysheem Accardo V, MD;  Location:  [facility];  Service: Gynecology;  Laterality: N/A;   Medications: Reviewed & Updated - see associated section                       Current outpatient prescriptions:  .  ALPRAZolam (XANAX) 0.5 MG tablet, Take 0.5 mg by mouth every 8 (eight) hours as needed for anxiety., Disp: , Rfl:  .  HYDROcodone-acetaminophen (NORCO) 10-325 MG tablet, Take 1 tablet by mouth every 6 (six) hours as needed., Disp: 120 tablet, Rfl: 0 .  phentermine 37.5 MG capsule, Take 1 capsule (37.5 mg total) by mouth every morning., Disp: 30 capsule, Rfl: 1 .  [DISCONTINUED] citalopram (CELEXA) 20 MG tablet, Take 20 mg by mouth daily.  , Disp: , Rfl:    Social History: Reviewed -  reports that she has been smoking Cigarettes.  She has a 6 pack-year smoking history. She has never used smokeless  tobacco.  Objective Findings:  Vitals: Blood pressure 110/70, height 5\' 6"  (1.676 m), weight 264 lb (119.75 kg).  Physical Examination: General appearance - alert, well appearing, and in no distress, there is a slight distractedness that has been noted since pt is on chronic opiates Abdomen - scars from previous incisions are well healed. There is described tenderness BILATERALLY , along the incision line,but worse on the pt left. Pelvic - normal external genitalia, vulva, vagina, cervix, uterus and adnexa,  VULVA: normal appearing vulva with no masses, tenderness or lesions,  VAGINA: normal appearing vagina with normal color and discharge, no lesions,  CERVIX: normal appearing cervix without discharge or lesions,  UTERUS: uterus is normal size, shape, consistency and nontender,  ADNEXA: normal adnexa in size, nontender and no masses,  PAP: Pap smear done today Musculoskeletal - no joint tenderness, deformity or swelling Extremities - peripheral pulses normal, no pedal edema, no clubbing or cyanosis Skin - normal coloration and turgor, no rashes, no suspicious skin lesions noted  Discussed with pt benefits of daily exercise. At end of discussion, pt had opportunity to ask questions and has no further questions at this time.    I spent more than 25 minutes with the visit, with >50% was spent in counseling and coordination of care.   Assessment & Plan:   A:  1. Chronic LLQ incisional pain s/p abdominal hysterectomy on 01/02/15, Status post excision of subcutaneous fibrosis on the left without improvement 2. Pt advised to sign up for MyChart   P:  1. Start tens unit today  2. [facility]  3. Refill Phentermine   4. F/u here in 3 weeks  By signing my name below, I, Doreatha MartinEva Mathews, attest that this documentation has been prepared under the direction and in the presence of Tilda BurrowJohn V Governor Matos, MD. Electronically Signed: Doreatha MartinEva Mathews, ED Scribe. 06/23/2016. 9:00  AM.  I personally performed the services described in this documentation, which was SCRIBED in my presence. The recorded information has been reviewed and considered accurate. It has been edited as necessary during review. Tilda BurrowFERGUSON,Iyla Balzarini V, MD

## 2016-06-25 LAB — CYTOLOGY - PAP

## 2016-06-29 ENCOUNTER — Encounter (HOSPITAL_COMMUNITY): Payer: Self-pay

## 2016-06-29 ENCOUNTER — Encounter (HOSPITAL_COMMUNITY): Payer: Self-pay | Admitting: Emergency Medicine

## 2016-06-29 ENCOUNTER — Emergency Department (HOSPITAL_COMMUNITY)
Admission: EM | Admit: 2016-06-29 | Discharge: 2016-06-29 | Disposition: A | Discharge status: Home / Self Care | Payer: Medicaid Other | Source: Home / Self Care | Attending: Emergency Medicine | Admitting: Emergency Medicine

## 2016-06-29 ENCOUNTER — Emergency Department (HOSPITAL_COMMUNITY)
Admission: EM | Admit: 2016-06-29 | Discharge: 2016-06-30 | Disposition: A | Discharge status: Home / Self Care | Payer: Medicaid Other | Attending: Emergency Medicine | Admitting: Emergency Medicine

## 2016-06-29 DIAGNOSIS — K0889 Other specified disorders of teeth and supporting structures: Secondary | ICD-10-CM | POA: Insufficient documentation

## 2016-06-29 DIAGNOSIS — J45909 Unspecified asthma, uncomplicated: Secondary | ICD-10-CM | POA: Insufficient documentation

## 2016-06-29 DIAGNOSIS — F329 Major depressive disorder, single episode, unspecified: Secondary | ICD-10-CM

## 2016-06-29 DIAGNOSIS — Z79899 Other long term (current) drug therapy: Secondary | ICD-10-CM | POA: Insufficient documentation

## 2016-06-29 DIAGNOSIS — R112 Nausea with vomiting, unspecified: Secondary | ICD-10-CM | POA: Diagnosis present

## 2016-06-29 DIAGNOSIS — F17210 Nicotine dependence, cigarettes, uncomplicated: Secondary | ICD-10-CM | POA: Diagnosis not present

## 2016-06-29 DIAGNOSIS — K029 Dental caries, unspecified: Secondary | ICD-10-CM | POA: Insufficient documentation

## 2016-06-29 MED ORDER — ONDANSETRON 8 MG PO TBDP
8.0000 mg | ORAL_TABLET | Freq: Once | ORAL | Status: AC
  Administered 2016-06-29: 8 mg via ORAL
  Filled 2016-06-29: qty 1

## 2016-06-29 MED ORDER — ONDANSETRON 4 MG PO TBDP: 4.0000 mg | ORAL_TABLET | Freq: Three times a day (TID) | ORAL | Status: DC | PRN

## 2016-06-29 MED ORDER — BUPIVACAINE HCL (PF) 0.5 % IJ SOLN
10.0000 mL | Freq: Once | INTRAMUSCULAR | Status: AC
  Administered 2016-06-29: 10 mL
  Filled 2016-06-29: qty 30

## 2016-06-29 MED ORDER — KETOROLAC TROMETHAMINE 30 MG/ML IJ SOLN
60.0000 mg | Freq: Once | INTRAMUSCULAR | Status: AC
  Administered 2016-06-29: 60 mg via INTRAMUSCULAR
  Filled 2016-06-29: qty 2

## 2016-06-29 MED ORDER — AMOXICILLIN 500 MG PO CAPS: 500.0000 mg | ORAL_CAPSULE | Freq: Three times a day (TID) | ORAL | Status: DC

## 2016-06-29 NOTE — ED Notes (Signed)
Patient verbalizes understanding of discharge instructions, prescriptions, home care and follow up care. Patient out of department at this time. 

## 2016-06-29 NOTE — ED Provider Notes (Signed)
By signing my name below, I, [facility] HospitalMarrissa Washington, attest that this documentation has been prepared under the direction and in the presence of Enbridge EnergyKristen N Lunden Mcleish, DO. Electronically Signed: Randell PatientMarrissa Washington, ED Scribe. 06/29/2016. 11:34 PM.  TIME SEEN: 11:23 PM  CHIEF COMPLAINT: Emesis  HPI: Barbara Jennings is a 35 y.o. female who presents to the Emergency Department complaining of intermittent, mild emesis onset 3 hours ago. Pt states that she was [facility] earlier today for dental pain due a fractured tooth where she received a dental block, was prescribed an antibiotic, and discharged home for follow-up with a dentist. She notes that she returned home where she took her antibiotics and a narcotic pain medication (prescribed by her PCP) followed by vomiting this evening. She reports associated chills. She has an hx of abdominal hysterectomy, appendectomy, right ovarian cystectomy and caesarean section. Per pt, she has an appointment with a dentist Dr. Mylinda LatinaVines tomorrow morning for her dental problems. Denies fever, diarrhea, hematuria, dysuria, vaginal bleeding, vaginal discharge, or any other symptoms currently.   ROS: See HPI Constitutional: no fever  Eyes: no drainage  ENT: no runny nose   Cardiovascular:  no chest pain  Resp: no SOB  GI:  vomiting GU: no dysuria Integumentary: no rash  Allergy: no hives  Musculoskeletal: no leg swelling  Neurological: no slurred speech ROS otherwise negative  PAST MEDICAL HISTORY/PAST SURGICAL HISTORY:  Past Medical History  Diagnosis Date  . Asthma   . Anxiety   . Panic attack   . Depression   . Migraine   . Abnormal Pap smear   . HSV-2 (herpes simplex virus 2) infection   . Vaginal Pap smear, abnormal   . Unspecified symptom associated with female genital organs 01/27/2014  . 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 Other and  unspecified ovarian cyst 02/06/2014  . Hx of migraines 02/09/2014    Has ?aura, will rx POP  . Vulvar abscess 03/09/2014    [facility] rx'd bactrim ds x 10 days  . Abnormal uterine bleeding (AUB) 10/11/2014  . Dysmenorrhea 10/11/2014  . Thickened endometrium 10/18/2014  . Ovarian cyst 10/18/2014    MEDICATIONS:  Prior to Admission medications   Medication Sig Start Date End Date Taking? Authorizing Provider  ALPRAZolam Prudy Feeler(XANAX) 0.5 MG tablet Take 0.5 mg by mouth every 8 (eight) hours as needed for anxiety.    Historical Provider, MD  amoxicillin (AMOXIL) 500 MG capsule Take 1 capsule (500 mg total) by mouth 3 (three) times daily. 06/29/16   Elson AreasLeslie K Sofia, PA-C  HYDROcodone-acetaminophen [facility](NORCO) 10-325 MG tablet Take 1 tablet by mouth every 6 (six) hours as needed. 06/23/16   Tilda BurrowJohn V Ferguson, MD  phentermine 37.5 MG capsule Take 1 capsule (37.5 mg total) by mouth every morning. 06/23/16   Tilda BurrowJohn V Ferguson, MD    ALLERGIES:  Allergies  Allergen Reactions  . Coconut Flavor Anaphylaxis    SOCIAL HISTORY:  Social History  Substance Use Topics  . Smoking status: Current Every Day Smoker -- 0.50 packs/day for 12 years    Types: Cigarettes  . Smokeless tobacco: Never Used  . Alcohol Use: Yes     Comment: socially    FAMILY HISTORY: Family History  Problem Relation Age of Onset  . Ulcers Father   . Cancer Maternal Aunt     breast   .  Diabetes Maternal Grandmother   . Diabetes Maternal Grandfather   . Alzheimer's disease Paternal Grandmother   . Diabetes Maternal Aunt   . Cancer Paternal Aunt     cervical    EXAM: BP 124/69 mmHg  Pulse 71  Temp(Src) 98 F (36.7 C) (Oral)  Resp 18  Ht 5\' 6"  (1.676 m)  Wt 264 lb (119.75 kg)  BMI 42.63 kg/m2  SpO2 100%  LMP  (LMP Unknown) CONSTITUTIONAL: Alert and oriented and responds appropriately to questions. Well-appearing; well-nourished, Afebrile and nontoxic, appears well-hydrated HEAD: Normocephalic EYES:  Conjunctivae clear, PERRL ENT: normal nose; no rhinorrhea; moist mucous membranes; No pharyngeal erythema or petechiae, no tonsillar hypertrophy or exudate, no uvular deviation, no trismus or drooling, normal phonation, no stridor, patient has decay noted to the second upper posterior right molar without dentin exposure, no dental abscess noted, no Ludwig's angina, tongue sits flat in the bottom of the mouth NECK: Supple, no meningismus, no LAD  CARD: RRR; S1 and S2 appreciated; no murmurs, no clicks, no rubs, no gallops RESP: Normal chest excursion without splinting or tachypnea; breath sounds clear and equal bilaterally; no wheezes, no rhonchi, no rales, no hypoxia or respiratory distress, speaking full sentences ABD/GI: Normal bowel sounds; non-distended; soft, non-tender, no rebound, no guarding, no peritoneal signs BACK:  The back appears normal and is non-tender to palpation, there is no CVA tenderness EXT: Normal ROM in all joints; non-tender to palpation; no edema; normal capillary refill; no cyanosis, no calf tenderness or swelling    SKIN: Normal color for age and race; warm; no rash NEURO: Moves all extremities equally, sensation to light touch intact diffusely, cranial nerves II through XII intact PSYCH: The patient's mood and manner are appropriate. Grooming and personal hygiene are appropriate.  MEDICAL DECISION MAKING: Patient here with nausea and vomiting. Suspect that this started after she took narcotic pain medication. She has uncomplicated dental pain. No dentin exposure on exam. No sign of drainable abscess. No Ludwig's angina. She denies diarrhea, GU symptoms. Abdominal exam completely benign. Have recommended she continue ibuprofen, Orajel for her pain at home. We'll give Zofran and fluid challenge patient. We'll discharge with prescription for Zofran. She has an appointment with her dentist tomorrow. She was prescribed antibiotics earlier this evening. States she has tolerated  penicillins well in the past. I feel she is safe to be discharged. Discussed return precautions. She verbalized understanding and is comfortable with this plan.    At this time, I do not feel there is any life-threatening condition present. I have reviewed and discussed all results (EKG, imaging, lab, urine as appropriate), exam findings with patient. I have reviewed nursing notes and appropriate previous records.  I feel the patient is safe to be discharged home without further emergent workup. Discussed usual and customary return precautions. Patient and family (if present) verbalize understanding and are comfortable with this plan.  Patient will follow-up with their primary care provider. If they do not have a primary care provider, information for follow-up has been provided to them. All questions have been answered.   I personally performed the services described in this documentation, which was scribed in my presence. The recorded information has been reviewed and is accurate.    Layla MawKristen N Azadeh Hyder, DO 06/29/16 2355

## 2016-06-29 NOTE — ED Notes (Signed)
Pt reports RT lower dental pain that radiates to RT side of face and ear x 1 day. Pt states that she has an exposed nerve. Reports having a dental appt tomorrow.

## 2016-06-29 NOTE — Discharge Instructions (Signed)

## 2016-06-29 NOTE — ED Notes (Signed)
Patient states she has a prescription for "Hydrocodone 10" from Dr. Emelda FearFerguson and took that for her dental pain.

## 2016-06-29 NOTE — ED Provider Notes (Signed)
History  By signing my name below, I, Earmon PhoenixJennifer Waddell, attest that this documentation has been prepared under the direction and in the presence of Langston MaskerKaren Faris Coolman, [place] JerseyPA-C. Electronically Signed: Earmon PhoenixJennifer Waddell, ED Scribe. 06/29/2016. 5:14 PM.  Chief Complaint  Patient presents with  . Dental Pain   The history is provided by the patient and medical records. No language interpreter was used.    HPI Comments:  Barbara Parodyracy M Jennings is a morbidly obese 35 y.o. female who presents to the Emergency Department complaining of right lower dental pain that began about four days ago. She reports she has a fractured tooth and states the root is exposed. She reports associated right ear pain. She has been applying clove oil, taken Ibuprofen and Vicodin with minimal relief of the pain. Touching the area and eating increases the pain. She denies alleviating factors. She denies fever, chills, nausea, vomiting, difficulty breathing or swallowing.   Past Medical History  Diagnosis Date  . Asthma   . Anxiety   . Panic attack   . Depression   . Migraine   . Abnormal Pap smear   . HSV-2 (herpes simplex virus 2) infection   . Vaginal Pap smear, abnormal   . Unspecified symptom associated with female genital organs 01/27/2014  . 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 Other and unspecified ovarian cyst 02/06/2014  . Hx of migraines 02/09/2014    Has ?aura, will rx POP  . Vulvar abscess 03/09/2014    [facility] bactrim ds x 10 days  . Abnormal uterine bleeding (AUB) 10/11/2014  . Dysmenorrhea 10/11/2014  . Thickened endometrium 10/18/2014  . Ovarian cyst 10/18/2014   Past Surgical History  Procedure Laterality Date  . Cesarean section    . Ovarian cyst removal    . Appendectomy    . Cholecystectomy    . Dilation and curettage of uterus    . Laparoscopic supracervical hysterectomy N/A  01/02/2015    Procedure: ATTEMPTED LAPAROSCOPIC SUPRACERVICAL HYSTERECTOMY CONVERTED TO OPEN AT [id number];  Surgeon: Tilda BurrowJohn Ferguson V, MD;  Location: [facility];  Service: Gynecology;  Laterality: N/A;  . Supracervical abdominal hysterectomy N/A 01/02/2015    Procedure: HYSTERECTOMY SUPRACERVICAL ABDOMINAL;  Surgeon: Tilda BurrowJohn Ferguson V, MD;  Location: [facility];  Service: Gynecology;  Laterality: N/A;  . Abdominal hysterectomy    . Scar revision N/A 06/26/2015    Procedure: ABDOMINAL SCAR REVISION;  Surgeon: Tilda BurrowJohn Ferguson V, MD;  Location: [facility];  Service: Gynecology;  Laterality: N/A;   Family History  Problem Relation Age of Onset  . Ulcers Father   . Cancer Maternal Aunt     breast   . Diabetes Maternal Grandmother   . Diabetes Maternal Grandfather   . Alzheimer's disease Paternal Grandmother   . Diabetes Maternal Aunt   . Cancer Paternal Aunt     cervical   Social History  Substance Use Topics  . Smoking status: Current Every Day Smoker -- 0.50 packs/day for 12 years    Types: Cigarettes  . Smokeless tobacco: Never Used  . Alcohol Use: Yes     Comment: socially   OB History    Gravida Para Term Preterm AB TAB SAB Ectopic Multiple Living   7 2 1 1 5  5   1      Review of Systems  Constitutional: Negative for fever and chills.  HENT: Positive  for dental problem and ear pain. Negative for trouble swallowing.   Gastrointestinal: Negative for nausea and vomiting.  All other systems reviewed and are negative.   Allergies  Coconut flavor  Home Medications   Prior to Admission medications   Medication Sig Start Date End Date Taking? Authorizing Provider  ALPRAZolam Prudy Feeler(XANAX) 0.5 MG tablet Take 0.5 mg by mouth every 8 (eight) hours as needed for anxiety.    Historical Provider, MD  HYDROcodone-acetaminophen (NORCO) 10-325 MG tablet Take 1 tablet by mouth every 6 (six) hours as needed. 06/23/16   Tilda BurrowJohn V Ferguson, MD  phentermine 37.5 MG capsule Take 1 capsule (37.5 mg total) by mouth every  morning. 06/23/16   Tilda BurrowJohn V Ferguson, MD   Triage Vitals: BP 134/77 mmHg  Pulse 69  Temp(Src) 98.3 F (36.8 C) (Oral)  Resp 18  Ht 5\' 6"  (1.676 m)  Wt 267 lb (121.11 kg)  BMI 43.12 kg/m2  SpO2 100%  LMP  (LMP Unknown) Physical Exam  Constitutional: She is oriented to person, place, and time. She appears well-developed and well-nourished.  HENT:  Head: Normocephalic and atraumatic.  Mouth/Throat: Uvula is midline. No trismus in the jaw. Abnormal dentition. Dental caries present. No dental abscesses.  Right upper second molar fractured. No swelling. No abscess.  Eyes: EOM are normal.  Neck: Normal range of motion.  Cardiovascular: Normal rate.   Pulmonary/Chest: Effort normal.  Musculoskeletal: Normal range of motion.  Neurological: She is alert and oriented to person, place, and time.  Skin: Skin is warm and dry.  Psychiatric: She has a normal mood and affect. Her behavior is normal.  Nursing note and vitals reviewed.   ED Course  Procedures (including critical care time) DIAGNOSTIC STUDIES: Oxygen Saturation is 100% on RA, normal by my interpretation.   COORDINATION OF CARE: 5:11 PM- Will prescribe antibiotics and administer dental block. Pt verbalizes understanding and agrees to plan.   Medications  bupivacaine (MARCAINE) 0.5 % injection 10 mL (not administered)   Labs Review Labs Reviewed - No data to display  Imaging Review No results found. I have personally reviewed and evaluated these images and lab results as part of my medical decision-making.   EKG Interpretation None      MDM Procedure 5cc marcaine to block right upper mouth and face   Final diagnoses:  Toothache   No outpatient prescriptions have been marked as taking for the 06/29/16 encounter [facility](Hospital Encounter).  An After Visit Summary was printed and given to the patient.  Lonia SkinnerLeslie K New LondonSofia, PA-C 06/29/16 1802  Bethann BerkshireJoseph Zammit, MD 06/30/16 [phone]

## 2016-06-29 NOTE — Discharge Instructions (Signed)

## 2016-06-29 NOTE — ED Notes (Signed)
Patient discharged 3 hours ago. States emesis started one hour ago. Patient states "ive thrown up 8 times in the past hour"

## 2016-06-30 ENCOUNTER — Telehealth: Payer: Self-pay | Admitting: *Deleted

## 2016-06-30 NOTE — Telephone Encounter (Signed)
Pt informed of abnormal pap results from 06/23/2016, ASCUS with + HPV, Colposcopy scheduled with Dr. Emelda FearFerguson on 07/14/2016 at 0830.

## 2016-06-30 NOTE — ED Notes (Signed)
Patient keep PO down with no complaints

## 2016-07-14 ENCOUNTER — Ambulatory Visit: Payer: Medicaid Other | Admitting: Obstetrics and Gynecology

## 2016-07-14 ENCOUNTER — Encounter: Payer: Self-pay | Admitting: Obstetrics and Gynecology

## 2016-07-14 ENCOUNTER — Other Ambulatory Visit: Payer: Self-pay | Admitting: Obstetrics and Gynecology

## 2016-07-14 ENCOUNTER — Ambulatory Visit (INDEPENDENT_AMBULATORY_CARE_PROVIDER_SITE_OTHER): Payer: Medicaid Other | Admitting: Obstetrics and Gynecology

## 2016-07-14 VITALS — BP 130/80 | HR 87 | Ht 66.0 in | Wt 260.0 lb

## 2016-07-14 DIAGNOSIS — R87810 Cervical high risk human papillomavirus (HPV) DNA test positive: Secondary | ICD-10-CM | POA: Diagnosis not present

## 2016-07-14 DIAGNOSIS — N870 Mild cervical dysplasia: Secondary | ICD-10-CM

## 2016-07-14 DIAGNOSIS — R87610 Atypical squamous cells of undetermined significance on cytologic smear of cervix (ASC-US): Secondary | ICD-10-CM

## 2016-07-14 DIAGNOSIS — G5792 Unspecified mononeuropathy of left lower limb: Secondary | ICD-10-CM | POA: Insufficient documentation

## 2016-07-14 DIAGNOSIS — IMO0002 Reserved for concepts with insufficient information to code with codable children: Secondary | ICD-10-CM

## 2016-07-14 MED ORDER — OXYCODONE HCL 7.5 MG PO TABS: 1.0000 | ORAL_TABLET | Freq: Four times a day (QID) | ORAL | Status: DC

## 2016-07-14 NOTE — Progress Notes (Signed)
Patient ID: Barbara Jennings, female   DOB: 11/27/1981, 35 y.o.   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 Pt has undergone both nerve block infiltration and steroid injections with no relief of discomfort. She states she has been taking Hydrocodone with no relief of pain. She also uses a tens unit at night, which she notes provides her some relief at night and allows her to sleep.   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   Pt also notes that she is doing well on the Phentermine and is losing weight at a steady weight, 30 lb in a yr.    Barbara Jennings 35 y.o. [id number] here for colposcopy for ASCUS with POSITIVE high risk HPV pap smear on 06/23/16.  Discussed role for HPV in cervical dysplasia, need for surveillance.  Patient given informed consent, signed copy in the chart, time out was performed.  Placed in lithotomy position. Cervix viewed with speculum and colposcope after application of acetic acid.   Colposcopy adequate? Yes Acetowhite lesion(s) noted at 11 o'clock on the anterior lip and 5 o'clock on the posterior lip; biopsies obtained at 11 o'clock on the anterior lip and 5 o'clock on the posterior lip.   ECC specimen obtained.  Applied Monsel's solution to the cervix.  All specimens were labelled and sent to pathology.  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 Will follow up pathology and manage accordingly.  Routine preventative health maintenance measures emphasized.   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 Discussed changing pain medication, as Hydrocodone is providing no relief of pain, and advised pt that we would consider changing the medication in a week after [facility]. Also discussed the possibility of LEEP intervention. At end of discussion, pt had opportunity to ask questions and has no further questions at this time.   Greater than 50% was spent in counseling and coordination of care with the patient. Total time greater than: 25 minutes    Plan:  Substitute Oxycodone 7.5 mg until f/u appointment next week for further discussion of medication changes Review [place] visit to [facility], Dr Ernst BreachLeblanc.   By signing my name below, I, Doreatha MartinEva Mathews, attest that this documentation has been prepared under the direction and in the presence of Tilda BurrowJohn V Irelyn Perfecto, MD. Electronically Signed: Doreatha MartinEva Mathews, ED Scribe. 07/14/2016. 9:00 AM.  I personally performed the services described in this documentation, which was SCRIBED in my presence. The recorded information has been reviewed and considered accurate. It has been edited as necessary during review. Tilda BurrowFERGUSON,Joneisha Miles V, MD

## 2016-07-17 ENCOUNTER — Encounter: Payer: Self-pay | Admitting: Obstetrics and Gynecology

## 2016-07-17 ENCOUNTER — Other Ambulatory Visit: Payer: Self-pay | Admitting: Obstetrics and Gynecology

## 2016-07-18 ENCOUNTER — Telehealth: Payer: Self-pay | Admitting: Obstetrics and Gynecology

## 2016-07-18 ENCOUNTER — Other Ambulatory Visit: Payer: Self-pay | Admitting: Obstetrics and Gynecology

## 2016-07-18 MED ORDER — OXYCODONE HCL 7.5 MG PO TABS: 1.0000 | ORAL_TABLET | ORAL | Status: DC

## 2016-07-18 NOTE — Telephone Encounter (Signed)
rx written for oxy 7.5 x 90 tab, pt to pick up. [place] was to [place] today

## 2016-07-25 ENCOUNTER — Ambulatory Visit (INDEPENDENT_AMBULATORY_CARE_PROVIDER_SITE_OTHER): Payer: Medicaid Other | Admitting: Obstetrics and Gynecology

## 2016-07-25 ENCOUNTER — Encounter: Payer: Self-pay | Admitting: Obstetrics and Gynecology

## 2016-07-25 VITALS — BP 112/70 | Ht 66.0 in | Wt 253.0 lb

## 2016-07-25 DIAGNOSIS — N879 Dysplasia of cervix uteri, unspecified: Secondary | ICD-10-CM | POA: Diagnosis not present

## 2016-07-25 DIAGNOSIS — G5792 Unspecified mononeuropathy of left lower limb: Secondary | ICD-10-CM | POA: Diagnosis not present

## 2016-07-25 NOTE — Progress Notes (Signed)
[facility] Visit  07/25/16          Patient name: Barbara Jennings MRN [id number]  Date of birth: 14-Apr-1981  CC & HPI:  Barbara Jennings is a 35 y.o. female presenting today for results of her cervical biopsy. Pt has an appointment scheduled for August 15th 2017 at [facility]. Pt has no other physical complaints or symptoms at this time, however states she is having a "rough week" as her best friend recently killed himself.    ROS:  ROS Otherwise negative   Pertinent History Reviewed:   Reviewed: Significant for +HPV and Depression Medical         Past Medical History:  Diagnosis Date  . 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 Abnormal Pap smear   . Abnormal uterine bleeding (AUB) 10/11/2014  . Anxiety   . Asthma   . Depression   . Dysmenorrhea 10/11/2014  . HSV-2 (herpes simplex virus 2) infection   . Hx of migraines 02/09/2014   Has ?aura, will rx POP  . Migraine   . Other and unspecified ovarian cyst 02/06/2014  . Ovarian cyst 10/18/2014  . Panic attack   . Thickened endometrium 10/18/2014  . Unspecified symptom associated with female genital organs 01/27/2014  . Vaginal Pap smear, abnormal   . Vulvar abscess 03/09/2014   I&D 3/9 in ER at [facility] was rx'd bactrim ds x 10 days                              Surgical Hx:    Past Surgical History:  Procedure Laterality Date  . ABDOMINAL HYSTERECTOMY    . APPENDECTOMY    . CESAREAN SECTION    . CHOLECYSTECTOMY    . DILATION AND CURETTAGE OF UTERUS    . LAPAROSCOPIC SUPRACERVICAL HYSTERECTOMY N/A 01/02/2015   Procedure: ATTEMPTED LAPAROSCOPIC SUPRACERVICAL HYSTERECTOMY CONVERTED TO OPEN AT 5374;  Surgeon: Tilda Burrow, MD;  Location: [facility];  Service: Gynecology;  Laterality: N/A;  . OVARIAN CYST REMOVAL    . SCAR REVISION N/A 06/26/2015   Procedure: ABDOMINAL SCAR REVISION;  Surgeon: Tilda Burrow, MD;   Location: [facility];  Service: Gynecology;  Laterality: N/A;  . SUPRACERVICAL ABDOMINAL HYSTERECTOMY N/A 01/02/2015   Procedure: HYSTERECTOMY SUPRACERVICAL ABDOMINAL;  Surgeon: Tilda Burrow, MD;  Location: [facility];  Service: Gynecology;  Laterality: N/A;   Medications: Reviewed & Updated - see associated section                       Current Outpatient Prescriptions:  .  ALPRAZolam (XANAX) 0.5 MG tablet, Take 0.5 mg by mouth every 8 (eight) hours as needed for anxiety., Disp: , Rfl:  .  HYDROcodone-acetaminophen (NORCO) 10-325 MG tablet, Take 1 tablet by mouth every 6 (six) hours as needed., Disp: 120 tablet, Rfl: 0 .  OxyCODONE HCl 7.5 MG TABA, Take 1 tablet by mouth 3 (three) times daily at 8am, 2pm and bedtime., Disp: 90 tablet, Rfl: 0 .  phentermine 37.5 MG capsule, Take 1 capsule (37.5 mg total) by mouth every morning., Disp: 30 capsule, Rfl: 1   Social History: Reviewed -  reports that she has been smoking Cigarettes.  She has a 6.00 pack-year smoking history. She has never used smokeless tobacco.  Objective Findings:  Vitals:  Blood pressure 112/70, height  (1.676 m), weight 253 lb (114.8 kg).  Physical Examination: General appearance - alert, well appearing, and in no distress and oriented to person, place, and time Mental status - alert, oriented to person, place, and time, normal mood, behavior, speech, dress, motor activity, and thought processes Pelvic exam: examination not indicated.  Colposcopy  Results  07/14/16 NEGATIVE FOR INTRAEPITHELIAL DYSPLASIA TRANSFORMATIONAL ZONE MUCOSA IS NOT PRESENT    Assessment:  1. 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 F/u with Kendell Bane on 08/12/16 and then f/u here following that; may require referral to [facility] (found on [facility]) for ilioinguinal nerve entrapment  2. Status post supracervical hysterectomy with abnormal Pap and currently normal colposcopic  biopsies, repeat Pap one year     By signing my name below, I, Freida Busman, attest that this documentation has been prepared under the direction and in the presence of Tilda Burrow, MD . Electronically Signed: Freida Busman, Scribe. 07/25/2016. 9:48 AM. I personally performed the services described in this documentation, which was SCRIBED in my presence. The recorded information has been reviewed and considered accurate. It has been edited as necessary during review. Tilda Burrow, MD

## 2016-07-25 NOTE — Telephone Encounter (Signed)
Taken care of at office.

## 2016-08-17 ENCOUNTER — Other Ambulatory Visit: Payer: Self-pay | Admitting: Obstetrics and Gynecology

## 2016-08-18 ENCOUNTER — Other Ambulatory Visit: Payer: Self-pay | Admitting: Obstetrics and Gynecology

## 2016-08-18 ENCOUNTER — Telehealth: Payer: Self-pay | Admitting: Obstetrics & Gynecology

## 2016-08-18 ENCOUNTER — Other Ambulatory Visit: Payer: Self-pay | Admitting: Obstetrics & Gynecology

## 2016-08-18 ENCOUNTER — Encounter: Payer: Self-pay | Admitting: Obstetrics and Gynecology

## 2016-08-18 MED ORDER — OXYCODONE HCL 7.5 MG PO TABS: 1.0000 | ORAL_TABLET | ORAL | 0 refills | Status: DC

## 2016-08-18 NOTE — Telephone Encounter (Signed)
Pt informed Rx for Oxycodone left at front desk for pick up. Pt verbalized understanding.

## 2016-08-18 NOTE — Telephone Encounter (Signed)
Pt states that she is Dr. Rayna SextonFerguson's patient and she has run out of her Oxycodone and that she send Dr. Emelda FearFerguson a mychart note and a prescription refill. Pt states that he knows her situation and that he normally just prints it out. I spoke with patient and let her know that he was not in the office, pt states that she can not walk without her medication. Pt wants another Doctor in the office to prescribe her the medication until she sees Dr. Emelda FearFerguson on the 23rid. Please contact pt

## 2016-08-19 ENCOUNTER — Encounter: Payer: Self-pay | Admitting: Obstetrics and Gynecology

## 2016-08-20 ENCOUNTER — Ambulatory Visit (INDEPENDENT_AMBULATORY_CARE_PROVIDER_SITE_OTHER): Payer: Medicaid Other | Admitting: Obstetrics and Gynecology

## 2016-08-20 ENCOUNTER — Encounter: Payer: Self-pay | Admitting: Obstetrics and Gynecology

## 2016-08-20 VITALS — BP 124/64 | HR 83 | Ht 66.0 in | Wt 264.2 lb

## 2016-08-20 DIAGNOSIS — Z6841 Body Mass Index (BMI) 40.0 and over, adult: Secondary | ICD-10-CM | POA: Diagnosis not present

## 2016-08-20 DIAGNOSIS — L7682 Other postprocedural complications of skin and subcutaneous tissue: Secondary | ICD-10-CM

## 2016-08-20 DIAGNOSIS — Z90711 Acquired absence of uterus with remaining cervical stump: Secondary | ICD-10-CM | POA: Diagnosis not present

## 2016-08-20 DIAGNOSIS — G5792 Unspecified mononeuropathy of left lower limb: Secondary | ICD-10-CM

## 2016-08-20 DIAGNOSIS — R208 Other disturbances of skin sensation: Secondary | ICD-10-CM

## 2016-08-20 DIAGNOSIS — E669 Obesity, unspecified: Secondary | ICD-10-CM

## 2016-08-20 MED ORDER — OXYCODONE HCL 7.5 MG PO TABS: 1.0000 | ORAL_TABLET | Freq: Four times a day (QID) | ORAL | 0 refills | Status: DC | PRN

## 2016-08-20 NOTE — Progress Notes (Addendum)
[facility] Visit  @DATE @            Patient name: Barbara Parodyracy M Manolis MRN [id number]  Date of birth: 06/08/1981  CC & HPI:  Barbara Jennings is a 35 y.o. female presenting today for Follow-up of ilioinguinal nerve neuralgia, status post hysterectomy, unsuccessfully corrected by surgery down to fascia at 6 months post-hysterectomy patient was seen August 15 at the pain clinic in [place], where the recommendation was that had a couple of options one being a nerve injection. The patient's had this in the past that worked for about an hour but had increased pain during the second day in response to the injection process. Second option would be referring her to a plastic surgeon for excision of the area in an effort to ensure that the nerve is excised. The patient is leaning toward this option  I believe the patient needs to try to lose weight during the time before considering any surgical options she's now 264 pounds with a BMI of 42, and is minimally active. She's been on phentermine but is not losing any weight. Encouraged her to consider water exercise water aerobics.  ROS:  ROS pain is not increased with physical activity just persists throughout activity   Pertinent History Reviewed:   Reviewed: Significant for Maintaining spirits pretty well throughout the procedure. She is out of work due to this disability Medical         Past Medical History:  Diagnosis Date  . 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 Abnormal Pap smear   . Abnormal uterine bleeding (AUB) 10/11/2014  . Anxiety   . Asthma   . Depression   . Dysmenorrhea 10/11/2014  . HSV-2 (herpes simplex virus 2) infection   . Hx of migraines 02/09/2014   Has ?aura, will rx POP  . Migraine   . Other and unspecified ovarian cyst 02/06/2014  . Ovarian cyst 10/18/2014  . Panic attack   . Thickened endometrium 10/18/2014  .  Unspecified symptom associated with female genital organs 01/27/2014  . Vaginal Pap smear, abnormal   . Vulvar abscess 03/09/2014   [facility] was rx'd bactrim ds x 10 days                              Surgical Hx:    Past Surgical History:  Procedure Laterality Date  . ABDOMINAL HYSTERECTOMY    . APPENDECTOMY    . CESAREAN SECTION    . CHOLECYSTECTOMY    . DILATION AND CURETTAGE OF UTERUS    . LAPAROSCOPIC SUPRACERVICAL HYSTERECTOMY N/A 01/02/2015   Procedure: ATTEMPTED LAPAROSCOPIC SUPRACERVICAL HYSTERECTOMY CONVERTED TO OPEN AT [id number];  Surgeon: Tilda BurrowJohn Veida Spira V, MD;  Location: [facility];  Service: Gynecology;  Laterality: N/A;  . OVARIAN CYST REMOVAL    . SCAR REVISION N/A 06/26/2015   Procedure: ABDOMINAL SCAR REVISION;  Surgeon: Tilda BurrowJohn Katlyn Muldrew V, MD;  Location: [facility];  Service: Gynecology;  Laterality: N/A;  . SUPRACERVICAL ABDOMINAL HYSTERECTOMY N/A 01/02/2015   Procedure: HYSTERECTOMY SUPRACERVICAL ABDOMINAL;  Surgeon: Tilda BurrowJohn Luan Urbani V, MD;  Location: [facility];  Service: Gynecology;  Laterality: N/A;   Medications: Reviewed & Updated - see associated section  Current Outpatient Prescriptions:  .  ALPRAZolam (XANAX) 0.5 MG tablet, Take 0.5 mg by mouth every 8 (eight) hours as needed for anxiety., Disp: , Rfl:  .  OxyCODONE HCl 7.5 MG TABA, Take 1 tablet by mouth every 6 (six) hours as needed., Disp: 120 tablet, Rfl: 0 .  phentermine 37.5 MG capsule, Take 1 capsule (37.5 mg total) by mouth every morning., Disp: 30 capsule, Rfl: 1   Social History: Reviewed -  reports that she has been smoking Cigarettes.  She has a 6.00 pack-year smoking history. She has never used smokeless tobacco.  Objective Findings:  Vitals: Blood pressure 124/64, pulse 83, height 5\' 6"  (1.676 m), weight 264 lb 3.2 oz (119.8 kg). Body mass index is 42.64 kg/m.   Physical Examination: General appearance - alert, well appearing, and in no distress and overweight Mental status - alert,  oriented to person, place, and time, normal mood, behavior, speech, dress, motor activity, and thought processes, affect appropriate to mood Abdomen - tenderness noted left lower quadrant above left side of incision scars from previous incisions prior Pfannenstiel incision, no palpable hernia   Assessment & Plan:   A:  1. llioinguinal nerve neuralgia, left side. S/p hysterectomy  P:  1. refil oxycodone. 2 pt to increase activity 3 no phentermine for now, til pt on diet. 4 pt to consider plastic surgeon [facility]; I will attend surgeryIf at all possible    Time: The provider spent over 25 minutes with the visit , including previsit review, and documentation,with >than 50% spent in counseling and coordination of care.

## 2016-08-30 ENCOUNTER — Encounter (HOSPITAL_COMMUNITY): Payer: Self-pay | Admitting: Emergency Medicine

## 2016-08-30 ENCOUNTER — Emergency Department (HOSPITAL_COMMUNITY)
Admission: EM | Admit: 2016-08-30 | Discharge: 2016-08-30 | Disposition: A | Discharge status: Home / Self Care | Payer: Medicaid Other | Attending: Emergency Medicine | Admitting: Emergency Medicine

## 2016-08-30 ENCOUNTER — Emergency Department (HOSPITAL_COMMUNITY): Payer: Medicaid Other

## 2016-08-30 DIAGNOSIS — G43009 Migraine without aura, not intractable, without status migrainosus: Secondary | ICD-10-CM | POA: Insufficient documentation

## 2016-08-30 DIAGNOSIS — Z79899 Other long term (current) drug therapy: Secondary | ICD-10-CM | POA: Diagnosis not present

## 2016-08-30 DIAGNOSIS — R079 Chest pain, unspecified: Secondary | ICD-10-CM | POA: Insufficient documentation

## 2016-08-30 DIAGNOSIS — R112 Nausea with vomiting, unspecified: Secondary | ICD-10-CM | POA: Diagnosis present

## 2016-08-30 DIAGNOSIS — J45909 Unspecified asthma, uncomplicated: Secondary | ICD-10-CM | POA: Diagnosis not present

## 2016-08-30 DIAGNOSIS — F17210 Nicotine dependence, cigarettes, uncomplicated: Secondary | ICD-10-CM | POA: Insufficient documentation

## 2016-08-30 DIAGNOSIS — K529 Noninfective gastroenteritis and colitis, unspecified: Secondary | ICD-10-CM | POA: Diagnosis not present

## 2016-08-30 DIAGNOSIS — R52 Pain, unspecified: Secondary | ICD-10-CM

## 2016-08-30 DIAGNOSIS — Z79891 Long term (current) use of opiate analgesic: Secondary | ICD-10-CM | POA: Diagnosis not present

## 2016-08-30 LAB — COMPREHENSIVE METABOLIC PANEL
ALBUMIN: 4.6 g/dL (ref 3.5–5.0)
ALK PHOS: 61 U/L (ref 38–126)
ALT: 19 U/L (ref 14–54)
ANION GAP: 8 (ref 5–15)
AST: 19 U/L (ref 15–41)
BILIRUBIN TOTAL: 1.2 mg/dL (ref 0.3–1.2)
BUN: 7 mg/dL (ref 6–20)
CALCIUM: 9.3 mg/dL (ref 8.9–10.3)
CO2: 26 mmol/L (ref 22–32)
CREATININE: 0.62 mg/dL (ref 0.44–1.00)
Chloride: 101 mmol/L (ref 101–111)
GFR calc Af Amer: >60 mL/min (ref >60)
GFR calc non Af Amer: >60 mL/min (ref >60)
GLUCOSE: 114 mg/dL — AB (ref 65–99)
Potassium: 3.7 mmol/L (ref 3.5–5.1)
SODIUM: 135 mmol/L (ref 135–145)
TOTAL PROTEIN: 8.1 g/dL (ref 6.5–8.1)

## 2016-08-30 LAB — CBC WITH DIFFERENTIAL/PLATELET
BASOS ABS: 0 10*3/uL (ref 0.0–0.1)
BASOS PCT: 0 %
EOS ABS: 0 10*3/uL (ref 0.0–0.7)
Eosinophils Relative: 0 %
HEMATOCRIT: 39.5 % (ref 36.0–46.0)
HEMOGLOBIN: 13.2 g/dL (ref 12.0–15.0)
Lymphocytes Relative: 17 %
Lymphs Abs: 1.9 10*3/uL (ref 0.7–4.0)
MCH: 28.6 pg (ref 26.0–34.0)
MCHC: 33.4 g/dL (ref 30.0–36.0)
MCV: 85.7 fL (ref 78.0–100.0)
MONOS PCT: 5 %
Monocytes Absolute: 0.5 10*3/uL (ref 0.1–1.0)
NEUTROS ABS: 8.9 10*3/uL — AB (ref 1.7–7.7)
NEUTROS PCT: 78 %
Platelets: 368 10*3/uL (ref 150–400)
RBC: 4.61 MIL/uL (ref 3.87–5.11)
RDW: 13.9 % (ref 11.5–15.5)
WBC: 11.3 10*3/uL — AB (ref 4.0–10.5)

## 2016-08-30 LAB — URINALYSIS, ROUTINE W REFLEX MICROSCOPIC
GLUCOSE, UA: NEGATIVE mg/dL
HGB URINE DIPSTICK: NEGATIVE
LEUKOCYTES UA: NEGATIVE
Nitrite: NEGATIVE
PH: 7 (ref 5.0–8.0)
Protein, ur: 30 mg/dL — AB
Specific Gravity, Urine: 1.02 (ref 1.005–1.030)

## 2016-08-30 LAB — URINE MICROSCOPIC-ADD ON: RBC / HPF: NONE SEEN RBC/hpf (ref 0–5)

## 2016-08-30 LAB — PREGNANCY, URINE: Preg Test, Ur: NEGATIVE

## 2016-08-30 MED ORDER — ONDANSETRON HCL 4 MG/2ML IJ SOLN
4.0000 mg | Freq: Once | INTRAMUSCULAR | Status: AC
  Administered 2016-08-30: 4 mg via INTRAVENOUS
  Filled 2016-08-30: qty 2

## 2016-08-30 MED ORDER — SODIUM CHLORIDE 0.9 % IV BOLUS (SEPSIS)
2000.0000 mL | Freq: Once | INTRAVENOUS | Status: AC
  Administered 2016-08-30: 2000 mL via INTRAVENOUS

## 2016-08-30 MED ORDER — ONDANSETRON 4 MG PO TBDP: ORAL_TABLET | ORAL | 0 refills | Status: DC

## 2016-08-30 MED ORDER — KETOROLAC TROMETHAMINE 30 MG/ML IJ SOLN
30.0000 mg | Freq: Once | INTRAMUSCULAR | Status: AC
  Administered 2016-08-30: 30 mg via INTRAVENOUS
  Filled 2016-08-30: qty 1

## 2016-08-30 MED ORDER — HYDROMORPHONE HCL 1 MG/ML IJ SOLN
1.0000 mg | Freq: Once | INTRAMUSCULAR | Status: AC
  Administered 2016-08-30: 1 mg via INTRAVENOUS
  Filled 2016-08-30: qty 1

## 2016-08-30 NOTE — ED Triage Notes (Signed)
PT stated she woke up yesterday with headache, generalized body aches then nausea and vomiting started yesterday evening then this am has had 5 episodes of diarrhea. PT denies any urinary symptoms or abdominal pain.

## 2016-08-30 NOTE — Discharge Instructions (Addendum)
Drink plenty of fluids and follow-up with your doctor next week if not improving. 

## 2016-08-30 NOTE — ED Provider Notes (Signed)
Pt is feeling better and is ok for d/c.   Jacalyn LefevreJulie Avilyn Virtue, MD 08/30/16 [phone]

## 2016-08-30 NOTE — ED Provider Notes (Signed)
[facility] Provider Note   CSN: [id number] Arrival date & time: 08/30/16  1330  By signing my name below, I, Avnee Patel, attest that this documentation has been prepared under the direction and in the presence of Bethann BerkshireJoseph Kindell Strada, MD  Electronically Signed: Clovis PuAvnee Patel, ED Scribe. 08/30/16. 1:58 PM.   History   Chief Complaint Chief Complaint  Patient presents with  . Vomiting    Patient complains of vomiting and diarrhea with headache. Patient has a history of migraine headaches   The history is provided by the patient. No language interpreter was used.  Emesis   This is a new problem. The current episode started yesterday. The problem has not changed since onset.There has been no fever. Associated symptoms include diarrhea, headaches and myalgias (generalized). Pertinent negatives include no abdominal pain and no cough. Risk factors: none.    Barbara Parodyracy M Jennings is a 35 y.o. female who presents to the Emergency Department complaining of constant, worsening and moderate headache onset yesterday morning. Pt states associated intermittent vomiting,  nausea, diarrhea, "achy" chest pain which began at 4 PM yesterday and has gone unchanged. Pt took migraine medication with no relief.  Pt denies a sore throat and cough. Pt reports no known recent sick contacts.   Past Medical History:  Diagnosis Date  . 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 Abnormal Pap smear   . Abnormal uterine bleeding (AUB) 10/11/2014  . Anxiety   . Asthma   . Depression   . Dysmenorrhea 10/11/2014  . HSV-2 (herpes simplex virus 2) infection   . Hx of migraines 02/09/2014   Has ?aura, will rx POP  . Migraine   . Other and unspecified ovarian cyst 02/06/2014  . Ovarian cyst 10/18/2014  . Panic attack   . Thickened endometrium 10/18/2014  . Unspecified symptom associated with female genital organs 01/27/2014  .  Vaginal Pap smear, abnormal   . Vulvar abscess 03/09/2014   [facility] was rx'd bactrim ds x 10 days    Patient Active Problem List   Diagnosis Date Noted  . 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 Combined abdominal and pelvic pain 10/26/2015  . Incisional pain s/p hysterectomy 06/26/2015  . Knee pain, chronic 09/12/2014  . Acute meniscal tear of knee 07/03/2014  . Dysplasia of cervix, unspecified 02/16/2014  . Hx of migraines 02/09/2014  . 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 Unspecified symptom associated with female genital organs 01/27/2014  . HSV-2 (herpes simplex virus 2) infection 05/26/2013    Past Surgical History:  Procedure Laterality Date  . ABDOMINAL HYSTERECTOMY    . APPENDECTOMY    . CESAREAN SECTION    . CHOLECYSTECTOMY    . DILATION AND CURETTAGE OF UTERUS    . LAPAROSCOPIC SUPRACERVICAL HYSTERECTOMY N/A 01/02/2015   Procedure: ATTEMPTED LAPAROSCOPIC SUPRACERVICAL HYSTERECTOMY CONVERTED TO OPEN AT [id number];  Surgeon: Tilda BurrowJohn Ferguson V, MD;  Location: [facility];  Service: Gynecology;  Laterality: N/A;  . OVARIAN CYST REMOVAL    . SCAR REVISION N/A 06/26/2015   Procedure: ABDOMINAL SCAR REVISION;  Surgeon: Tilda BurrowJohn Ferguson V, MD;  Location: [facility];  Service: Gynecology;  Laterality: N/A;  . SUPRACERVICAL ABDOMINAL HYSTERECTOMY N/A 01/02/2015   Procedure: HYSTERECTOMY SUPRACERVICAL ABDOMINAL;  Surgeon: Tilda BurrowJohn Ferguson V, MD;  Location: [facility];  Service: Gynecology;  Laterality: N/A;  OB History    Gravida Para Term Preterm AB Living   7 2 1 1 5 1    SAB TAB Ectopic Multiple Live Births   5       1       Home Medications    Prior to Admission medications   Medication Sig Start Date End Date Taking? Authorizing Provider  ALPRAZolam Prudy Feeler) 0.5 MG tablet Take 0.5 mg by mouth every 8 (eight) hours as needed for anxiety.    Historical Provider, MD  OxyCODONE HCl 7.5 MG TABA Take 1 tablet  by mouth every 6 (six) hours as needed. 08/20/16   Tilda Burrow, MD  phentermine 37.5 MG capsule Take 1 capsule (37.5 mg total) by mouth every morning. 06/23/16   Tilda Burrow, MD    Family History Family History  Problem Relation Age of Onset  . Ulcers Father   . Cancer Maternal Aunt     breast   . Diabetes Maternal Grandmother   . Diabetes Maternal Grandfather   . Alzheimer's disease Paternal Grandmother   . Diabetes Maternal Aunt   . Cancer Paternal Aunt     cervical    Social History Social History  Substance Use Topics  . Smoking status: Current Every Day Smoker    Packs/day: 0.50    Years: 12.00    Types: Cigarettes  . Smokeless tobacco: Never Used  . Alcohol use Yes     Comment: socially     Allergies   Coconut flavor   Review of Systems Review of Systems  Constitutional: Negative for appetite change and fatigue.  HENT: Negative for congestion, ear discharge and sinus pressure.   Eyes: Negative for discharge.  Respiratory: Negative for cough.   Cardiovascular: Positive for chest pain (ache).  Gastrointestinal: Positive for diarrhea, nausea and vomiting. Negative for abdominal pain.  Genitourinary: Negative for frequency and hematuria.  Musculoskeletal: Positive for myalgias (generalized). Negative for back pain.  Skin: Negative for rash.  Neurological: Positive for headaches. Negative for seizures.  Psychiatric/Behavioral: Negative for hallucinations.     Physical Exam Updated Vital Signs BP 105/55   Pulse 93   Temp 99.8 F (37.7 C) (Oral)   Resp 20   Ht 5\' 6"  (1.676 m)   Wt 264 lb (119.7 kg)   LMP  (LMP Unknown)   SpO2 93%   BMI 42.61 kg/m   Physical Exam  Constitutional: She is oriented to person, place, and time. She appears well-developed.  HENT:  Head: Normocephalic.  Dry mucous membranes .   Eyes: Conjunctivae and EOM are normal. No scleral icterus.  Neck: Neck supple. No thyromegaly present.  Bilateral anterior neck tenderness    Cardiovascular: Normal rate and regular rhythm.  Exam reveals no gallop and no friction rub.   No murmur heard. Pulmonary/Chest: No stridor. She has no wheezes. She has no rales. She exhibits no tenderness.  Abdominal: She exhibits no distension. There is no tenderness. There is no rebound.  Musculoskeletal: Normal range of motion. She exhibits no edema.  Lymphadenopathy:    She has no cervical adenopathy.  Neurological: She is oriented to person, place, and time. She exhibits normal muscle tone. Coordination normal.  Skin: No rash noted. No erythema.  Psychiatric: She has a normal mood and affect. Her behavior is normal.  Nursing note and vitals reviewed.    ED Treatments / Results  DIAGNOSTIC STUDIES:  Oxygen Saturation is 93% on room air, adequate by my interpretation.    COORDINATION OF CARE:  1:53 PM Will order lab work. Discussed treatment plan with pt at bedside and pt agreed to plan.  Labs (all labs ordered are listed, but only abnormal results are displayed) Labs Reviewed - No data to display  EKG  EKG Interpretation None       Radiology No results found.  Procedures Procedures (including critical care time)  Medications Ordered in ED Medications - No data to display   Initial Impression / Assessment and Plan / ED Course  I have reviewed the triage vital signs and the nursing notes.  Pertinent labs & imaging results that were available during my care of the patient were reviewed by me and considered in my medical decision making (see chart for details).  Clinical Course    Gastroenteritis. Patient improved with fluids and pain medicine. She will be sent home with Zofran and will take her own pain medicines for her aches and headache  Final Clinical Impressions(s) / ED Diagnoses   Final diagnoses:  None    New Prescriptions New Prescriptions   No medications on file   The chart was scribed for me under my direct supervision.  I personally  performed the history, physical, and medical decision making and all procedures in the evaluation of this patient.Bethann Berkshire, MD 08/30/16 [phone]

## 2016-09-10 ENCOUNTER — Telehealth: Payer: Self-pay | Admitting: Obstetrics and Gynecology

## 2016-09-11 NOTE — Telephone Encounter (Signed)
Unable to reach pt

## 2016-09-17 ENCOUNTER — Emergency Department (HOSPITAL_COMMUNITY)
Admission: EM | Admit: 2016-09-17 | Discharge: 2016-09-17 | Disposition: A | Discharge status: Home / Self Care | Payer: Medicaid Other | Attending: Emergency Medicine | Admitting: Emergency Medicine

## 2016-09-17 ENCOUNTER — Encounter (HOSPITAL_COMMUNITY): Payer: Self-pay | Admitting: Emergency Medicine

## 2016-09-17 ENCOUNTER — Emergency Department (HOSPITAL_COMMUNITY): Payer: Medicaid Other

## 2016-09-17 DIAGNOSIS — R109 Unspecified abdominal pain: Secondary | ICD-10-CM | POA: Insufficient documentation

## 2016-09-17 DIAGNOSIS — J029 Acute pharyngitis, unspecified: Secondary | ICD-10-CM | POA: Insufficient documentation

## 2016-09-17 DIAGNOSIS — Z79899 Other long term (current) drug therapy: Secondary | ICD-10-CM | POA: Insufficient documentation

## 2016-09-17 DIAGNOSIS — R112 Nausea with vomiting, unspecified: Secondary | ICD-10-CM | POA: Insufficient documentation

## 2016-09-17 DIAGNOSIS — F17210 Nicotine dependence, cigarettes, uncomplicated: Secondary | ICD-10-CM | POA: Insufficient documentation

## 2016-09-17 DIAGNOSIS — J45909 Unspecified asthma, uncomplicated: Secondary | ICD-10-CM | POA: Diagnosis not present

## 2016-09-17 DIAGNOSIS — M791 Myalgia: Secondary | ICD-10-CM | POA: Diagnosis present

## 2016-09-17 DIAGNOSIS — J329 Chronic sinusitis, unspecified: Secondary | ICD-10-CM | POA: Diagnosis not present

## 2016-09-17 LAB — I-STAT CHEM 8, ED
BUN: 6 mg/dL (ref 6–20)
CALCIUM ION: 1.13 mmol/L — AB (ref 1.15–1.40)
Chloride: 104 mmol/L (ref 101–111)
Creatinine, Ser: 0.5 mg/dL (ref 0.44–1.00)
Glucose, Bld: 96 mg/dL (ref 65–99)
HEMATOCRIT: 40 % (ref 36.0–46.0)
HEMOGLOBIN: 13.6 g/dL (ref 12.0–15.0)
Potassium: 3.7 mmol/L (ref 3.5–5.1)
SODIUM: 140 mmol/L (ref 135–145)
TCO2: 24 mmol/L (ref 0–100)

## 2016-09-17 LAB — CBC WITH DIFFERENTIAL/PLATELET
BASOS PCT: 0 %
Basophils Absolute: 0 10*3/uL (ref 0.0–0.1)
EOS ABS: 0.1 10*3/uL (ref 0.0–0.7)
EOS PCT: 1 %
HCT: 39.2 % (ref 36.0–46.0)
HEMOGLOBIN: 13.2 g/dL (ref 12.0–15.0)
Lymphocytes Relative: 29 %
Lymphs Abs: 3.2 10*3/uL (ref 0.7–4.0)
MCH: 28.3 pg (ref 26.0–34.0)
MCHC: 33.7 g/dL (ref 30.0–36.0)
MCV: 84.1 fL (ref 78.0–100.0)
MONO ABS: 0.5 10*3/uL (ref 0.1–1.0)
MONOS PCT: 5 %
NEUTROS PCT: 65 %
Neutro Abs: 7.1 10*3/uL (ref 1.7–7.7)
PLATELETS: 324 10*3/uL (ref 150–400)
RBC: 4.66 MIL/uL (ref 3.87–5.11)
RDW: 13.5 % (ref 11.5–15.5)
WBC: 10.9 10*3/uL — ABNORMAL HIGH (ref 4.0–10.5)

## 2016-09-17 LAB — BASIC METABOLIC PANEL
Anion gap: 7 (ref 5–15)
BUN: 8 mg/dL (ref 6–20)
CALCIUM: 9.2 mg/dL (ref 8.9–10.3)
CO2: 22 mmol/L (ref 22–32)
CREATININE: 0.5 mg/dL (ref 0.44–1.00)
Chloride: 108 mmol/L (ref 101–111)
Glucose, Bld: 98 mg/dL (ref 65–99)
Potassium: 3.7 mmol/L (ref 3.5–5.1)
SODIUM: 137 mmol/L (ref 135–145)

## 2016-09-17 LAB — URINALYSIS, ROUTINE W REFLEX MICROSCOPIC
BILIRUBIN URINE: NEGATIVE
Glucose, UA: NEGATIVE mg/dL
HGB URINE DIPSTICK: NEGATIVE
KETONES UR: NEGATIVE mg/dL
Leukocytes, UA: NEGATIVE
NITRITE: NEGATIVE
Protein, ur: NEGATIVE mg/dL
Specific Gravity, Urine: >1.03 — ABNORMAL HIGH (ref 1.005–1.030)
pH: 5.5 (ref 5.0–8.0)

## 2016-09-17 LAB — PREGNANCY, URINE: Preg Test, Ur: NEGATIVE

## 2016-09-17 MED ORDER — DIPHENHYDRAMINE HCL 50 MG/ML IJ SOLN
25.0000 mg | Freq: Once | INTRAMUSCULAR | Status: AC
  Administered 2016-09-17: 25 mg via INTRAVENOUS
  Filled 2016-09-17: qty 1

## 2016-09-17 MED ORDER — ONDANSETRON 4 MG PO TBDP: ORAL_TABLET | ORAL | 0 refills | Status: DC

## 2016-09-17 MED ORDER — LEVOFLOXACIN 750 MG PO TABS: 750.0000 mg | ORAL_TABLET | Freq: Every day | ORAL | 0 refills | Status: DC

## 2016-09-17 MED ORDER — METHYLPREDNISOLONE SODIUM SUCC 125 MG IJ SOLR
125.0000 mg | Freq: Once | INTRAMUSCULAR | Status: AC
  Administered 2016-09-17: 125 mg via INTRAVENOUS
  Filled 2016-09-17: qty 2

## 2016-09-17 MED ORDER — SODIUM CHLORIDE 0.9 % IV BOLUS (SEPSIS)
1000.0000 mL | Freq: Once | INTRAVENOUS | Status: AC
  Administered 2016-09-17: 1000 mL via INTRAVENOUS

## 2016-09-17 MED ORDER — KETOROLAC TROMETHAMINE 30 MG/ML IJ SOLN
30.0000 mg | Freq: Once | INTRAMUSCULAR | Status: AC
  Administered 2016-09-17: 30 mg via INTRAVENOUS
  Filled 2016-09-17: qty 1

## 2016-09-17 MED ORDER — ALBUTEROL SULFATE (2.5 MG/3ML) 0.083% IN NEBU
2.5000 mg | INHALATION_SOLUTION | Freq: Once | RESPIRATORY_TRACT | Status: AC
  Administered 2016-09-17: 2.5 mg via RESPIRATORY_TRACT
  Filled 2016-09-17: qty 3

## 2016-09-17 MED ORDER — IPRATROPIUM-ALBUTEROL 0.5-2.5 (3) MG/3ML IN SOLN
3.0000 mL | Freq: Once | RESPIRATORY_TRACT | Status: AC
  Administered 2016-09-17: 3 mL via RESPIRATORY_TRACT
  Filled 2016-09-17: qty 3

## 2016-09-17 MED ORDER — METOCLOPRAMIDE HCL 5 MG/ML IJ SOLN
10.0000 mg | Freq: Once | INTRAMUSCULAR | Status: AC
  Administered 2016-09-17: 10 mg via INTRAVENOUS
  Filled 2016-09-17: qty 2

## 2016-09-17 NOTE — ED Triage Notes (Signed)
Onset last night, abdominal pain, headache, vomiting/ diarrhea, and weakness

## 2016-09-17 NOTE — ED Notes (Signed)
Pt states the vomiting in unrelated to coughing.

## 2016-09-17 NOTE — ED Provider Notes (Signed)
[facility] Note   CSN: [id number] Arrival date & time: 09/17/16  1549     History   Chief Complaint Chief Complaint  Patient presents with  . Generalized Body Aches    HPI Barbara Jennings is a 35 y.o. female.   Shortness of Breath  This is a recurrent problem. The average episode lasts 10 days. The problem occurs continuously.The current episode started more than 1 week ago. The problem has been gradually worsening. Associated symptoms include sore throat, vomiting and abdominal pain. Pertinent negatives include no fever, no ear pain, no neck pain, no PND, no orthopnea, no chest pain and no syncope. She has tried nothing for the symptoms. She has had no prior hospitalizations. She has had prior ED visits. She has had no prior ICU admissions.    Past Medical History:  Diagnosis Date  . 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 Abnormal Pap smear   . Abnormal uterine bleeding (AUB) 10/11/2014  . Anxiety   . Asthma   . Depression   . Dysmenorrhea 10/11/2014  . HSV-2 (herpes simplex virus 2) infection   . Hx of migraines 02/09/2014   Has ?aura, will rx POP  . Migraine   . Other and unspecified ovarian cyst 02/06/2014  . Ovarian cyst 10/18/2014  . Panic attack   . Thickened endometrium 10/18/2014  . Unspecified symptom associated with female genital organs 01/27/2014  . Vaginal Pap smear, abnormal   . Vulvar abscess 03/09/2014   [facility] was rx'd bactrim ds x 10 days    Patient Active Problem List   Diagnosis Date Noted  . 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 Combined abdominal and pelvic pain 10/26/2015  . Incisional pain s/p hysterectomy 06/26/2015  . Knee pain, chronic 09/12/2014  . Acute meniscal tear of knee 07/03/2014  . Dysplasia of cervix, unspecified 02/16/2014  . Hx of migraines 02/09/2014  . Abnormal cervical  Papanicolaou smear with positive human papilloma virus (HPV) DNA test 02/02/2014  . Unspecified symptom associated with female genital organs 01/27/2014  . HSV-2 (herpes simplex virus 2) infection 05/26/2013    Past Surgical History:  Procedure Laterality Date  . ABDOMINAL HYSTERECTOMY    . APPENDECTOMY    . CESAREAN SECTION    . CHOLECYSTECTOMY    . DILATION AND CURETTAGE OF UTERUS    . LAPAROSCOPIC SUPRACERVICAL HYSTERECTOMY N/A 01/02/2015   Procedure: ATTEMPTED LAPAROSCOPIC SUPRACERVICAL HYSTERECTOMY CONVERTED TO OPEN AT [id number];  Surgeon: Tilda BurrowJohn Ferguson V, MD;  Location: [facility];  Service: Gynecology;  Laterality: N/A;  . OVARIAN CYST REMOVAL    . SCAR REVISION N/A 06/26/2015   Procedure: ABDOMINAL SCAR REVISION;  Surgeon: Tilda BurrowJohn Ferguson V, MD;  Location: [facility];  Service: Gynecology;  Laterality: N/A;  . SUPRACERVICAL ABDOMINAL HYSTERECTOMY N/A 01/02/2015   Procedure: HYSTERECTOMY SUPRACERVICAL ABDOMINAL;  Surgeon: Tilda BurrowJohn Ferguson V, MD;  Location: [facility];  Service: Gynecology;  Laterality: N/A;    OB History    Gravida Para Term Preterm AB Living   7 2 1 1 5 1    SAB TAB Ectopic Multiple Live Births   5       1       Home Medications    Prior to Admission medications   Medication Sig Start Date End Date Taking? Authorizing Provider  ALPRAZolam Prudy Feeler(XANAX) 0.5 MG tablet Take 0.5 mg by mouth  every 8 (eight) hours as needed for anxiety.   Yes Historical Provider, MD  OxyCODONE HCl 7.5 MG TABA Take 1 tablet by mouth every 6 (six) hours as needed. Patient taking differently: Take 1 tablet by mouth every 6 (six) hours as needed (pain).  08/20/16  Yes Tilda Burrow, MD  phentermine 37.5 MG capsule Take 1 capsule (37.5 mg total) by mouth every morning. 06/23/16  Yes Tilda Burrow, MD  levofloxacin (LEVAQUIN) 750 MG tablet Take 1 tablet (750 mg total) by mouth daily. X 7 days 09/17/16   Marily Memos, MD  ondansetron (ZOFRAN ODT) 4 MG disintegrating tablet 4mg  ODT q4 hours prn nausea/vomit 09/17/16    Marily Memos, MD    Family History Family History  Problem Relation Age of Onset  . Ulcers Father   . Cancer Maternal Aunt     breast   . Diabetes Maternal Grandmother   . Diabetes Maternal Grandfather   . Alzheimer's disease Paternal Grandmother   . Diabetes Maternal Aunt   . Cancer Paternal Aunt     cervical    Social History Social History  Substance Use Topics  . Smoking status: Current Every Day Smoker    Packs/day: 0.50    Years: 12.00    Types: Cigarettes  . Smokeless tobacco: Never Used  . Alcohol use Yes     Comment: socially     Allergies   Coconut flavor   Review of Systems Review of Systems  Constitutional: Positive for chills and fatigue. Negative for fever.  HENT: Positive for congestion, postnasal drip and sore throat. Negative for ear pain.   Respiratory: Positive for shortness of breath. Negative for chest tightness.   Cardiovascular: Negative for chest pain, orthopnea, syncope and PND.  Gastrointestinal: Positive for abdominal pain, diarrhea, nausea and vomiting.  Musculoskeletal: Negative for neck pain.  All other systems reviewed and are negative.    Physical Exam Updated Vital Signs BP 119/57 (BP Location: Left Arm)   Pulse 67   Temp 98.7 F (37.1 C) (Oral)   Resp 18   Ht 5\' 6"  (1.676 m)   Wt 154 lb (69.9 kg)   LMP  (LMP Unknown)   SpO2 99%   BMI 24.86 kg/m   Physical Exam  Constitutional: She appears well-developed and well-nourished. No distress.  HENT:  Head: Normocephalic and atraumatic.  Eyes: Conjunctivae are normal.  Neck: Neck supple.  Cardiovascular: Normal rate and regular rhythm.  Exam reveals no friction rub.   No murmur heard. Pulmonary/Chest: Effort normal. No respiratory distress. She has wheezes.  Abdominal: Soft. There is no tenderness.  Musculoskeletal: She exhibits no edema or deformity.  Neurological: She is alert. No cranial nerve deficit.  Skin: Skin is warm and dry.  Psychiatric: She has a normal  mood and affect.  Nursing note and vitals reviewed.    ED Treatments / Results  Labs (all labs ordered are listed, but only abnormal results are displayed) Labs Reviewed  CBC WITH DIFFERENTIAL/PLATELET - Abnormal; Notable for the following:       Result Value   WBC 10.9 (*)    All other components within normal limits  URINALYSIS, ROUTINE W REFLEX MICROSCOPIC (NOT AT [facility]) - Abnormal; Notable for the following:    APPearance HAZY (*)    Specific Gravity, Urine >1.030 (*)    All other components within normal limits  I-STAT CHEM 8, ED - Abnormal; Notable for the following:    Calcium, Ion 1.13 (*)    All  other components within normal limits  BASIC METABOLIC PANEL  PREGNANCY, URINE    EKG  EKG Interpretation None       Radiology Dg Chest 2 View  Result Date: 09/17/2016 CLINICAL DATA:  Non productive cough x 2 weeks. V/D Since yesterday. Denies pain. History of asthma. EXAM: CHEST  2 VIEW COMPARISON:  08/30/2016 FINDINGS: Midline trachea.  Normal heart size and mediastinal contours. Sharp costophrenic angles.  No pneumothorax.  Clear lungs. IMPRESSION: No active cardiopulmonary disease. Electronically Signed   By: Jeronimo Greaves M.D.   On: 09/17/2016 17:46    Procedures Procedures (including critical care time)  Medications Ordered in ED Medications  ipratropium-albuterol (DUONEB) 0.5-2.5 (3) MG/3ML nebulizer solution 3 mL (3 mLs Nebulization Given 09/17/16 1738)  albuterol (PROVENTIL) (2.5 MG/3ML) 0.083% nebulizer solution 2.5 mg (2.5 mg Nebulization Given 09/17/16 1738)  metoCLOPramide (REGLAN) injection 10 mg (10 mg Intravenous Given 09/17/16 1832)  sodium chloride 0.9 % bolus 1,000 mL (0 mLs Intravenous Stopped 09/17/16 1937)  methylPREDNISolone sodium succinate (SOLU-MEDROL) 125 mg/2 mL injection 125 mg (125 mg Intravenous Given 09/17/16 1834)  diphenhydrAMINE (BENADRYL) injection 25 mg (25 mg Intravenous Given 09/17/16 1832)  ketorolac (TORADOL) 30 MG/ML injection 30 mg  (30 mg Intravenous Given 09/17/16 1833)     Initial Impression / Assessment and Plan / ED Course  I have reviewed the triage vital signs and the nursing notes.  Pertinent labs & imaging results that were available during my care of the patient were reviewed by me and considered in my medical decision making (see chart for details).  Clinical Course   Likely viral illness with sinusitis. Symptoms slightly improved to the point of being able to sleep in the ED. No change in status otherwise. Stable for discharge with antibiotics.      Final Clinical Impressions(s) / ED Diagnoses   Final diagnoses:  Recurrent sinusitis, unspecified chronicity, unspecified location    New Prescriptions Discharge Medication List as of 09/17/2016  8:38 PM    START taking these medications   Details  levofloxacin (LEVAQUIN) 750 MG tablet Take 1 tablet (750 mg total) by mouth daily. X 7 days, Starting Wed 09/17/2016, Print         Marily Memos, MD 09/17/16 2157

## 2016-09-24 ENCOUNTER — Ambulatory Visit (INDEPENDENT_AMBULATORY_CARE_PROVIDER_SITE_OTHER): Payer: Medicaid Other | Admitting: Obstetrics and Gynecology

## 2016-09-24 ENCOUNTER — Encounter: Payer: Self-pay | Admitting: Obstetrics and Gynecology

## 2016-09-24 DIAGNOSIS — Z6841 Body Mass Index (BMI) 40.0 and over, adult: Secondary | ICD-10-CM

## 2016-09-24 DIAGNOSIS — E669 Obesity, unspecified: Secondary | ICD-10-CM

## 2016-09-24 DIAGNOSIS — G5782 Other specified mononeuropathies of left lower limb: Secondary | ICD-10-CM

## 2016-09-24 MED ORDER — OXYCODONE HCL 7.5 MG PO TABS: 1.0000 | ORAL_TABLET | Freq: Four times a day (QID) | ORAL | 0 refills | Status: DC | PRN

## 2016-09-24 NOTE — Progress Notes (Signed)
Patient ID: Barbara Parodyracy M Gores, female   DOB: 01/15/1981, 35 y.o.   MRN: [id number]    [facility]  @DATE @            Patient name: Barbara Jennings MRN [id number]  Date of birth: 12/05/1981  CC & YQM:VHQIONHPI:Refill oxycodone 7.5 every 6 hours being taken while she waits plastic [facility] for ilioinguinal nerve entrapment and neurALGIA   Chief Complaint  Patient presents with  . Follow-up    refill medication     Barbara Jennings is a 35 y.o. female presenting today for medication refill of 7.5 mg Oxycodone. Pt has persistent ilioinguinal nerve neuralgia s/p hysterectomy, which was unsuccessfully corrected by surgery down to fascia at 6 months post-hysterectomy. At her last office visit on 08/20/16, she was encouraged to consider weight loss before consideration of surgical options as she indicated she is currently minimally active.    [facility], pt was restarted on Topamax and given a trial of Voltaren gel. Pulsed RFA and plastic surgery for excision of the nerve was discussed.   Pt states that she has not been [facility] or by the plastic surgeon since her last visit here. Pt states she has reached out to both offices, but is waiting on a call back to be seen. She states she has been walking with her TENS unit daily, as recommended. She reports this treatment still has not provided her adequate relief of pain.   ROS:  Review of Systems  Gastrointestinal: Positive for abdominal pain (chronic).    Pertinent History Reviewed:   Reviewed: Significant for abdominal hysterectomy, scar revision, cesarean section, D&C Medical         Past Medical History:  Diagnosis Date  . 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 Abnormal Pap smear   . Abnormal uterine bleeding (AUB) 10/11/2014  . Anxiety   . Asthma   . Depression   .  Dysmenorrhea 10/11/2014  . HSV-2 (herpes simplex virus 2) infection   . Hx of migraines 02/09/2014   Has ?aura, will rx POP  . Migraine   . Other and unspecified ovarian cyst 02/06/2014  . Ovarian cyst 10/18/2014  . Panic attack   . Thickened endometrium 10/18/2014  . Unspecified symptom associated with female genital organs 01/27/2014  . Vaginal Pap smear, abnormal   . Vulvar abscess 03/09/2014   [facility] was rx'd bactrim ds x 10 days                              Surgical Hx:    Past Surgical History:  Procedure Laterality Date  . ABDOMINAL HYSTERECTOMY    . APPENDECTOMY    . CESAREAN SECTION    . CHOLECYSTECTOMY    . DILATION AND CURETTAGE OF UTERUS    . LAPAROSCOPIC SUPRACERVICAL HYSTERECTOMY N/A 01/02/2015   Procedure: ATTEMPTED LAPAROSCOPIC SUPRACERVICAL HYSTERECTOMY CONVERTED TO OPEN AT [id number];  Surgeon: Tilda BurrowJohn Krisann Mckenna V, MD;  Location: [facility];  Service: Gynecology;  Laterality: N/A;  . OVARIAN CYST REMOVAL    . SCAR REVISION N/A 06/26/2015   Procedure: ABDOMINAL SCAR REVISION;  Surgeon: Tilda BurrowJohn Gabriel Conry V, MD;  Location: [facility];  Service: Gynecology;  Laterality: N/A;  . SUPRACERVICAL ABDOMINAL HYSTERECTOMY N/A 01/02/2015  Procedure: HYSTERECTOMY SUPRACERVICAL ABDOMINAL;  Surgeon: Tilda Burrow, MD;  Location: [facility];  Service: Gynecology;  Laterality: N/A;   Medications: Reviewed & Updated - see associated section                       Current Outpatient Prescriptions:  .  ALPRAZolam (XANAX) 0.5 MG tablet, Take 0.5 mg by mouth every 8 (eight) hours as needed for anxiety., Disp: , Rfl:  .  OxyCODONE HCl 7.5 MG TABA, Take 1 tablet by mouth every 6 (six) hours as needed. (Patient taking differently: Take 1 tablet by mouth every 6 (six) hours as needed (pain). ), Disp: 120 tablet, Rfl: 0 .  phentermine 37.5 MG capsule, Take 1 capsule (37.5 mg total) by mouth every morning., Disp: 30 capsule, Rfl: 1 .  ondansetron (ZOFRAN ODT) 4 MG disintegrating tablet, 4mg  ODT q4 hours  prn nausea/vomit, Disp: 12 tablet, Rfl: 0   Social History: Reviewed -  reports that she has been smoking Cigarettes.  She has a 6.00 pack-year smoking history. She has never used smokeless tobacco.  Objective Findings:  Vitals: Blood pressure 138/80, pulse 64, height 5\' 6"  (1.676 m), weight 251 lb (113.9 kg).  Physical Examination: Discussion only   The provider spent more than 45 minutes with the visit, including previsit review, documentation and exam, with >50% was spent in counseling and coordination of care.   Assessment & Plan:   A:  1. Persistent ilioinguinal nerve neuralgia s/p hysterectomy, unsuccessfully corrected by surgery down to fascia at 6 months post-hysterectomy 2. Unsuccessful f/u with plastic surgery and [facility] thus far  3. Weight loss methods again discussed and encouraged . I made a clear to her that she cannot be given weight loss suppressant's as this is a failed in the past. He is been asked once again to get a stepped counter  P:  1. Refill oxycodone  2. Continue to persist with f/u at [facility] pain clinic and plastic surgeon  3. I will also contact [facility] pain clinic in attempt to expedite referral process  4. Consider weight loss strategies including calorie counting   By signing my name below, I, Doreatha Martin, attest that this documentation has been prepared under the direction and in the presence of Tilda Burrow, MD. Electronically Signed: Doreatha Martin, ED Scribe. 09/24/16. 11:39 AM.  I personally performed the services described in this documentation, which was SCRIBED in my presence. The recorded information has been reviewed and considered accurate. It has been edited as necessary during review. Tilda Burrow, MD

## 2016-09-26 DIAGNOSIS — G5780 Other specified mononeuropathies of unspecified lower limb: Secondary | ICD-10-CM | POA: Insufficient documentation

## 2016-10-24 ENCOUNTER — Ambulatory Visit (INDEPENDENT_AMBULATORY_CARE_PROVIDER_SITE_OTHER): Payer: Medicaid Other | Admitting: Obstetrics and Gynecology

## 2016-10-24 ENCOUNTER — Encounter: Payer: Self-pay | Admitting: Obstetrics and Gynecology

## 2016-10-24 VITALS — BP 122/62 | HR 76 | Wt 262.0 lb

## 2016-10-24 DIAGNOSIS — E669 Obesity, unspecified: Secondary | ICD-10-CM | POA: Diagnosis not present

## 2016-10-24 DIAGNOSIS — Z90711 Acquired absence of uterus with remaining cervical stump: Secondary | ICD-10-CM

## 2016-10-24 DIAGNOSIS — Z6841 Body Mass Index (BMI) 40.0 and over, adult: Secondary | ICD-10-CM | POA: Diagnosis not present

## 2016-10-24 DIAGNOSIS — R1030 Lower abdominal pain, unspecified: Secondary | ICD-10-CM | POA: Diagnosis not present

## 2016-10-24 DIAGNOSIS — G5792 Unspecified mononeuropathy of left lower limb: Secondary | ICD-10-CM | POA: Diagnosis not present

## 2016-10-24 MED ORDER — PHENTERMINE HCL 37.5 MG PO CAPS: 37.5000 mg | ORAL_CAPSULE | ORAL | 1 refills | Status: DC

## 2016-10-24 MED ORDER — OXYCODONE HCL 7.5 MG PO TABS: 1.0000 | ORAL_TABLET | Freq: Four times a day (QID) | ORAL | 0 refills | Status: DC | PRN

## 2016-10-24 NOTE — Progress Notes (Signed)
[facility] Visit  10/24/16            Patient name: Barbara Jennings MRN [id number]  Date of birth: 03/04/1981  CC & HPI:  Barbara Jennings is a 35 y.o. female presenting today for medication refill and discussion onset today. Pt states that she is here to discuss her persistent ilioinguinal nerve neuralgia s/p hysterectomy. Pt [facility]14/2016 at 1 PM with Dr. Sherren MochaLoree Kalliainen. Pt has tried Rx 7.5 mg oxycodone with no relief of her symptoms. Pt denies any other symptoms.    ROS:  Review of Systems  Gastrointestinal: Positive for abdominal pain (chronic).     Pertinent History Reviewed:   Reviewed: Significant for abdominal hysterectomy, scar revision, C-section, D&C Medical         Past Medical History:  Diagnosis Date  . 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 Abnormal Pap smear   . Abnormal uterine bleeding (AUB) 10/11/2014  . Anxiety   . Asthma   . Depression   . Dysmenorrhea 10/11/2014  . HSV-2 (herpes simplex virus 2) infection   . Hx of migraines 02/09/2014   Has ?aura, will rx POP  . Migraine   . Other and unspecified ovarian cyst 02/06/2014  . Ovarian cyst 10/18/2014  . Panic attack   . Thickened endometrium 10/18/2014  . Unspecified symptom associated with female genital organs 01/27/2014  . Vaginal Pap smear, abnormal   . Vulvar abscess 03/09/2014   [facility] was rx'd bactrim ds x 10 days                              Surgical Hx:    Past Surgical History:  Procedure Laterality Date  . ABDOMINAL HYSTERECTOMY    . APPENDECTOMY    . CESAREAN SECTION    . CHOLECYSTECTOMY    . DILATION AND CURETTAGE OF UTERUS    . LAPAROSCOPIC SUPRACERVICAL HYSTERECTOMY N/A 01/02/2015   Procedure: ATTEMPTED LAPAROSCOPIC SUPRACERVICAL HYSTERECTOMY CONVERTED TO OPEN AT [id number];  Surgeon: Tilda BurrowJohn Jhovani Griswold V, MD;  Location: [facility];  Service: Gynecology;  Laterality: N/A;  . OVARIAN CYST REMOVAL    . SCAR REVISION N/A 06/26/2015   Procedure: ABDOMINAL SCAR REVISION;  Surgeon: Tilda BurrowJohn Rylyn Ranganathan V, MD;  Location: [facility];  Service: Gynecology;  Laterality: N/A;  . SUPRACERVICAL ABDOMINAL HYSTERECTOMY N/A 01/02/2015   Procedure: HYSTERECTOMY SUPRACERVICAL ABDOMINAL;  Surgeon: Tilda BurrowJohn Coulton Schlink V, MD;  Location: [facility];  Service: Gynecology;  Laterality: N/A;   Medications: Reviewed & Updated - see associated section                       Current Outpatient Prescriptions:  .  ALPRAZolam (XANAX) 0.5 MG tablet, Take 0.5 mg by mouth every 8 (eight) hours as needed for anxiety., Disp: , Rfl:  .  OxyCODONE HCl 7.5 MG TABA, Take 1 tablet by mouth every 6 (six) hours as needed., Disp: 120 tablet, Rfl: 0 .  phentermine 37.5 MG capsule, Take 1 capsule (37.5 mg total) by mouth every morning., Disp: 30 capsule, Rfl: 1 .  ondansetron (ZOFRAN ODT) 4 MG disintegrating tablet, 4mg  ODT q4 hours prn nausea/vomit (Patient not taking: Reported on 10/24/2016), Disp: 12 tablet, Rfl: 0   Social History: Reviewed -  reports that she has been smoking Cigarettes.  She has a 6.00 pack-year smoking history. She has never used smokeless tobacco.  Objective Findings:  Vitals: Blood pressure 122/62, pulse 76, weight 262 lb (118.8 kg).  Physical Examination: Discussion only.   Discussion: 1. Discussed with pt risks and benefits of attempting to coordinate and speak with Dr. Berneta Sages to see if Dr. Emelda Fear can attend the surgery. Pt given provider contact information to aid in coordination of care with Dr. Sherren Mocha. As well as following up with appointment with Dr. Sherren Mocha on 11/14/2016. Pt reminded that weight loss would increase the success of her surgery.  At end of discussion, pt had opportunity to ask questions and has no further questions at this time.   Specific discussion of coordination of speaking with Dr. Sherren Mocha  and weight loss as noted above. Greater than 50% was spent in counseling and coordination of care with the patient.   Total time greater than: 15 minutes.   Assessment & Plan:   A:  1. Persistent ilioinguinal nerve neuralgia s/p hysterectomy, unsuccessfully corrected by surgery down to fascia at 6 months post-hysterectomy 2. Weight loss counseling  P:  1. Follow up with [facility] Plastic Surgeon dr Jannetta Quint  appointment on 11/14/2016 2. Refill Rx oxycodone 7.5 mg  3. Rx 37.5 mg phentermine pt to focus on wt loss and increased activity til surgery   By signing my name below, I, Soijett Blue, attest that this documentation has been prepared under the direction and in the presence of Tilda Burrow, MD. Electronically Signed: Soijett Blue, ED Scribe. 10/24/16. 9:28 AM   I personally performed the services described in this documentation, which was SCRIBED in my presence. The recorded information has been reviewed and considered accurate. It has been edited as necessary during review. Tilda Burrow, MD

## 2016-11-14 ENCOUNTER — Encounter: Payer: Self-pay | Admitting: Obstetrics and Gynecology

## 2016-11-24 ENCOUNTER — Encounter: Payer: Self-pay | Admitting: Obstetrics and Gynecology

## 2016-11-24 ENCOUNTER — Ambulatory Visit (INDEPENDENT_AMBULATORY_CARE_PROVIDER_SITE_OTHER): Payer: Medicaid Other | Admitting: Obstetrics and Gynecology

## 2016-11-24 VITALS — BP 132/72 | HR 86 | Ht 66.0 in | Wt 259.0 lb

## 2016-11-24 DIAGNOSIS — Z6841 Body Mass Index (BMI) 40.0 and over, adult: Secondary | ICD-10-CM

## 2016-11-24 DIAGNOSIS — G5792 Unspecified mononeuropathy of left lower limb: Secondary | ICD-10-CM | POA: Diagnosis not present

## 2016-11-24 MED ORDER — OXYCODONE HCL 7.5 MG PO TABS: 1.0000 | ORAL_TABLET | Freq: Four times a day (QID) | ORAL | 0 refills | Status: DC | PRN

## 2016-11-24 NOTE — Progress Notes (Signed)
[facility] Visit  11/24/16           Patient name: Barbara Parodyracy M Wach MRN [id number]  Date of birth: 04/02/1981  CC & HPI:  Barbara Jennings is a 35 y.o. female presenting today for medication refill. She continues to have persistent ilioinguinal nerve neuralgia s/p hysterectomy, unsuccessfully corrected by surgery down to fascia at 6 months post-hysterectomy. She was last seen in the office on 10/24/16 and was advised to lose weight. She was prescribed phentermine 37.5 mg and refilled oxycodone 7.5 mg. She states she has an appointment with [facility], Dr. Sherren MochaLoree Kalliainen, on 12/19/16.  She has not been successful at weight loss, and seems unmotivated at this time. The importance of tracing during her part to prepare herself for surgery emphasized  ROS:  ROS +abdominal pain persistent in RLQ. Marland Kitchen.  Pertinent History Reviewed:   Reviewed: Significant for hysterectomy  Medical         Past Medical History:  Diagnosis Date  . 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 Abnormal Pap smear   . Abnormal uterine bleeding (AUB) 10/11/2014  . Anxiety   . Asthma   . Depression   . Dysmenorrhea 10/11/2014  . HSV-2 (herpes simplex virus 2) infection   . Hx of migraines 02/09/2014   Has ?aura, will rx POP  . Migraine   . Other and unspecified ovarian cyst 02/06/2014  . Ovarian cyst 10/18/2014  . Panic attack   . Thickened endometrium 10/18/2014  . Unspecified symptom associated with female genital organs 01/27/2014  . Vaginal Pap smear, abnormal   . Vulvar abscess 03/09/2014   [facility] rx'd bactrim ds x 10 days                              Surgical Hx:    Past Surgical History:  Procedure Laterality Date  . ABDOMINAL HYSTERECTOMY    . APPENDECTOMY    . CESAREAN SECTION    . CHOLECYSTECTOMY    . DILATION AND CURETTAGE OF UTERUS    . LAPAROSCOPIC  SUPRACERVICAL HYSTERECTOMY N/A 01/02/2015   Procedure: ATTEMPTED LAPAROSCOPIC SUPRACERVICAL HYSTERECTOMY CONVERTED TO OPEN AT [id number];  Surgeon: Tilda BurrowJohn Deysi Soldo V, MD;  Location: [facility];  Service: Gynecology;  Laterality: N/A;  . OVARIAN CYST REMOVAL    . SCAR REVISION N/A 06/26/2015   Procedure: ABDOMINAL SCAR REVISION;  Surgeon: Tilda BurrowJohn Johnna Bollier V, MD;  Location: [facility];  Service: Gynecology;  Laterality: N/A;  . SUPRACERVICAL ABDOMINAL HYSTERECTOMY N/A 01/02/2015   Procedure: HYSTERECTOMY SUPRACERVICAL ABDOMINAL;  Surgeon: Tilda BurrowJohn Baltasar Twilley V, MD;  Location: [facility];  Service: Gynecology;  Laterality: N/A;   Medications: Reviewed & Updated - see associated section                       Current Outpatient Prescriptions:  .  ALPRAZolam (XANAX) 0.5 MG tablet, Take 0.5 mg by mouth every 8 (eight) hours as needed for anxiety., Disp: , Rfl:  .  OxyCODONE HCl 7.5 MG TABA, Take 1 tablet by mouth every 6 (six) hours as needed., Disp: 120 tablet, Rfl: 0 .  phentermine 37.5 MG capsule, Take 1 capsule (37.5 mg total) by mouth every morning., Disp: 30 capsule, Rfl: 1 .  ondansetron (ZOFRAN ODT) 4 MG disintegrating tablet, 4mg   ODT q4 hours prn nausea/vomit (Patient not taking: Reported on 11/24/2016), Disp: 12 tablet, Rfl: 0   Social History: Reviewed -  reports that she has been smoking Cigarettes.  She has a 6.00 pack-year smoking history. She has never used smokeless tobacco.  Objective Findings:  Vitals: Blood pressure 132/72, pulse 86, height 5\' 6"  (1.676 m), weight 259 lb (117.5 kg).  Physical Examination: discussion only   Discussion: Discussed the need to increase activity level and find motivation to lose weight.   At end of discussion, pt had opportunity to ask questions and has no further questions at this time.   Specific discussion of need to lose weight as noted above. Greater than 50% was spent in counseling and coordination of care with the patient.   Total time greater than: 25 minutes.      Assessment & Plan:   A:  1. Persistent ilioinguinal nerve neuralgia s/p hysterectomy, unsuccessfully corrected by surgery down to fascia at 6 months post-hysterectomy 2. Weight loss counseling again completed. 3. 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 1. [facility] Dr. Sherren MochaLoree Kalliainen [phone] appointment on 12/19/16 2. Follow up in 4 weeks  3 Discussed case with Fleet Contrasachel[facility]. Pt to receive nerve injection testing at her 12/22 appt, and if pt considered appropriate for denervation surgery, I have asked to be given the opportunity to observe the surgery for education.    By signing my name below, I, Sonum Patel, attest that this documentation has been prepared under the direction and in the presence of Tilda BurrowJohn V Notnamed Croucher, MD. Electronically Signed: Sonum Patel, Neurosurgeoncribe. 11/24/16. 11:26 AM.  I personally performed the services described in this documentation, which was SCRIBED in my presence. The recorded information has been reviewed and considered accurate. It has been edited as necessary during review. Tilda BurrowFERGUSON,Gearl Kimbrough V, MD

## 2016-12-09 IMAGING — DX DG ANKLE COMPLETE 3+V*R*
3 series · 3 of 3 positions shown · non-contrast
Comparison: 05/25/2014

CLINICAL DATA: Acute right ankle twisting injury today with right
pain and swelling. Initial encounter.

EXAM:
RIGHT ANKLE - COMPLETE 3+ VIEW

[ankle ap]
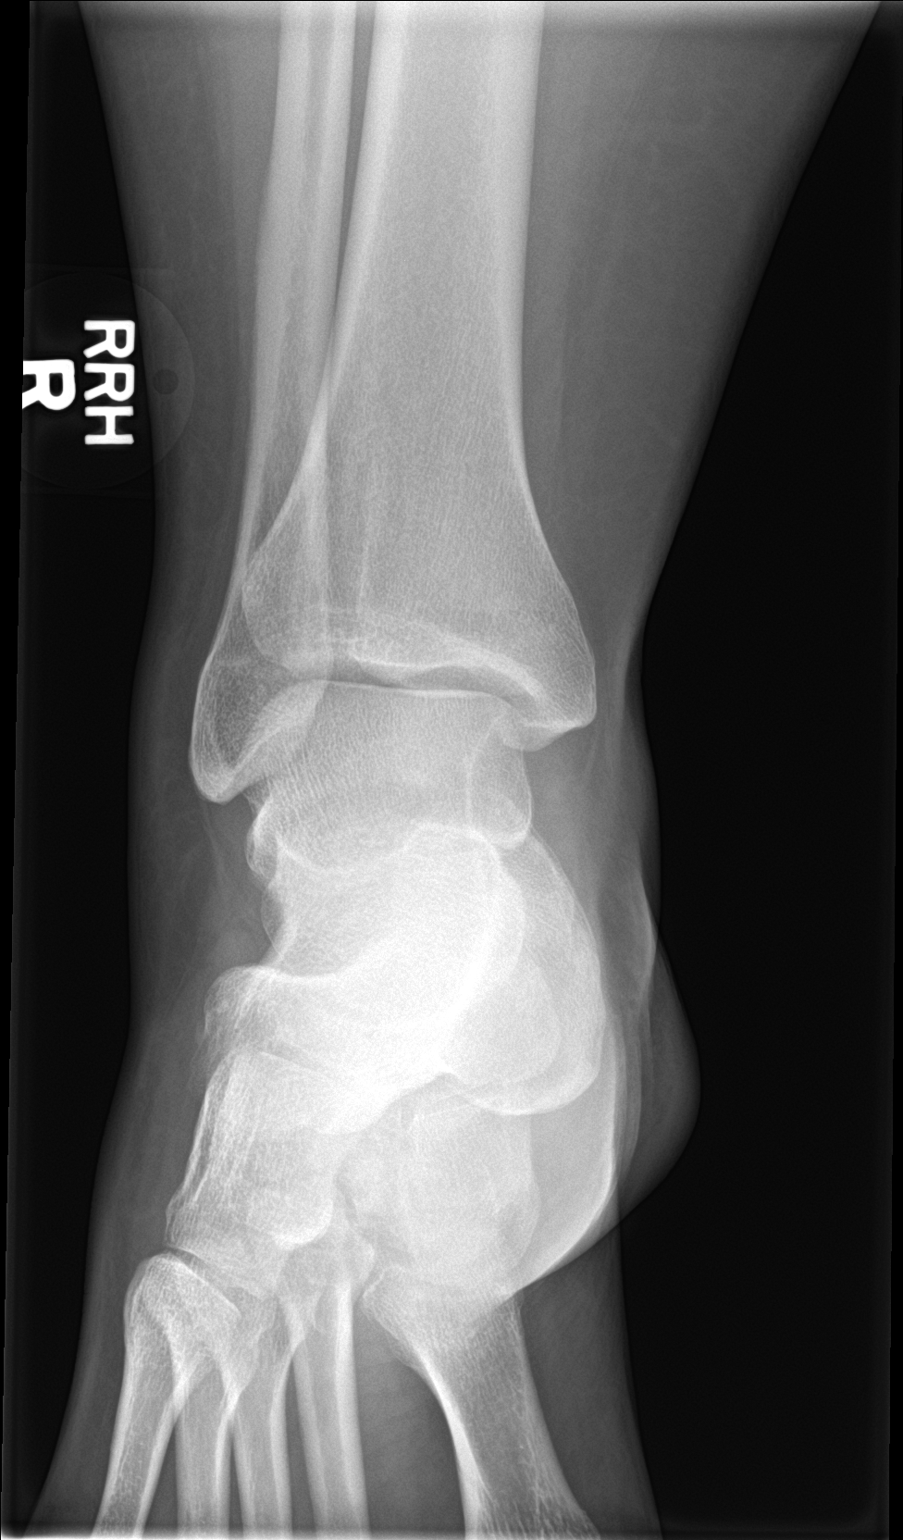

[ankle obl]
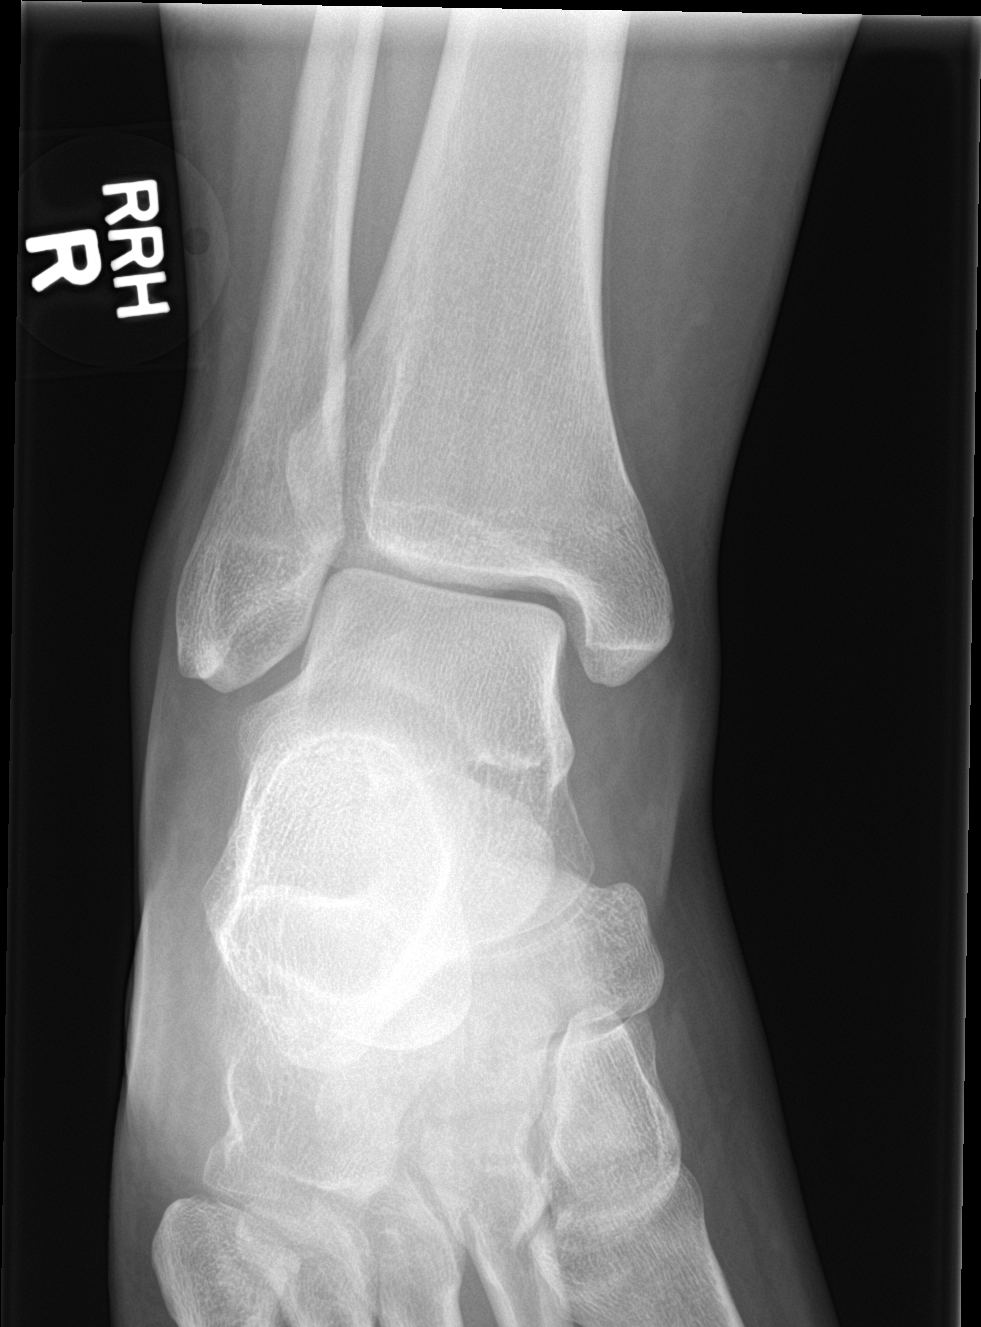

[ankle lat]
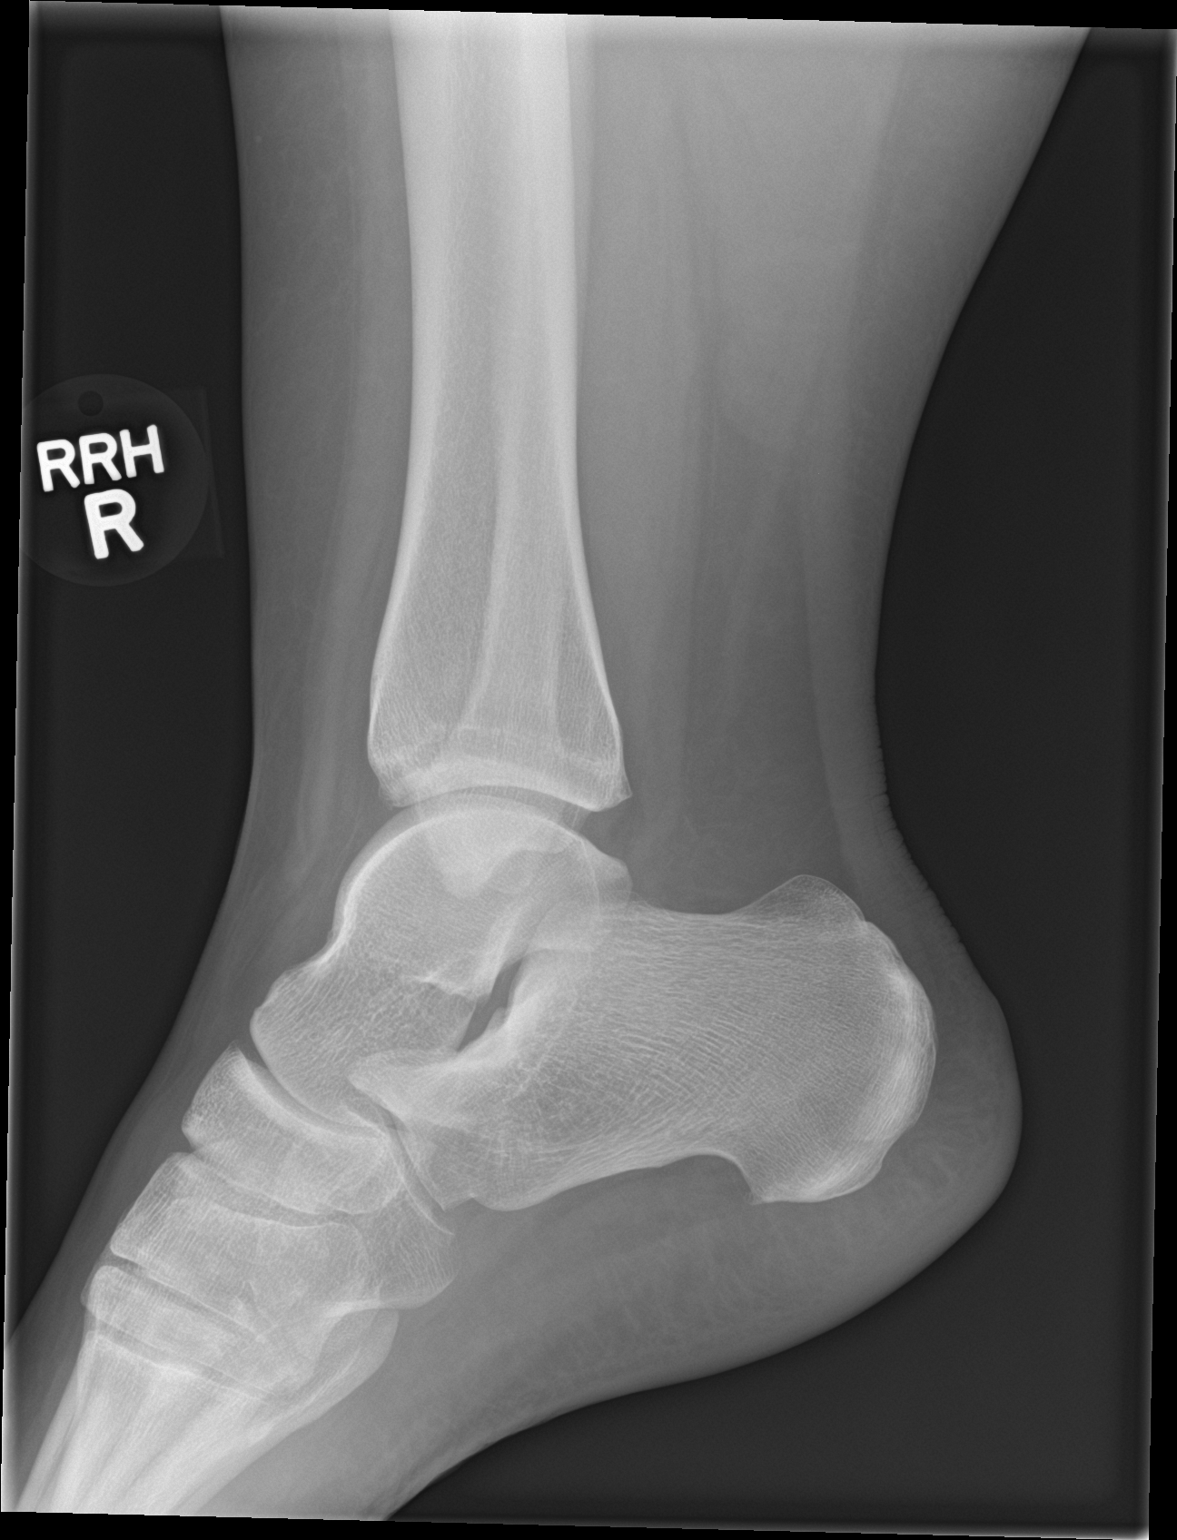

[3 of 3 positions shown; findings below may reference images not displayed]

FINDINGS: There is no evidence of acute fracture, subluxation or dislocation.

Mild lateral soft tissue swelling is noted.

No focal bony lesions are present.

The joint spaces are unremarkable.
IMPRESSION: Soft tissue swelling without bony abnormality.

## 2016-12-16 ENCOUNTER — Encounter (HOSPITAL_COMMUNITY): Payer: Self-pay

## 2016-12-16 ENCOUNTER — Emergency Department (HOSPITAL_COMMUNITY)
Admission: EM | Admit: 2016-12-16 | Discharge: 2016-12-16 | Disposition: A | Discharge status: Home / Self Care | Payer: Medicaid Other | Attending: Emergency Medicine | Admitting: Emergency Medicine

## 2016-12-16 DIAGNOSIS — G43909 Migraine, unspecified, not intractable, without status migrainosus: Secondary | ICD-10-CM | POA: Diagnosis present

## 2016-12-16 DIAGNOSIS — J45909 Unspecified asthma, uncomplicated: Secondary | ICD-10-CM | POA: Insufficient documentation

## 2016-12-16 DIAGNOSIS — Z79899 Other long term (current) drug therapy: Secondary | ICD-10-CM | POA: Diagnosis not present

## 2016-12-16 DIAGNOSIS — F17210 Nicotine dependence, cigarettes, uncomplicated: Secondary | ICD-10-CM | POA: Insufficient documentation

## 2016-12-16 DIAGNOSIS — G43009 Migraine without aura, not intractable, without status migrainosus: Secondary | ICD-10-CM | POA: Diagnosis not present

## 2016-12-16 MED ORDER — HYDROMORPHONE HCL 1 MG/ML IJ SOLN
1.0000 mg | Freq: Once | INTRAMUSCULAR | Status: AC
  Administered 2016-12-16: 1 mg via INTRAVENOUS
  Filled 2016-12-16: qty 1

## 2016-12-16 MED ORDER — SODIUM CHLORIDE 0.9 % IV BOLUS (SEPSIS)
500.0000 mL | Freq: Once | INTRAVENOUS | Status: AC
  Administered 2016-12-16: 500 mL via INTRAVENOUS

## 2016-12-16 MED ORDER — PROCHLORPERAZINE EDISYLATE 5 MG/ML IJ SOLN
5.0000 mg | Freq: Once | INTRAMUSCULAR | Status: AC
  Administered 2016-12-16: 5 mg via INTRAVENOUS
  Filled 2016-12-16: qty 2

## 2016-12-16 NOTE — ED Triage Notes (Signed)
Pt reports migraine headache since yesterday and vomiting today.  Took her oxycodone as prescribed yesterday but none today because she's vomiting.

## 2016-12-16 NOTE — ED Provider Notes (Signed)
[facility] Provider Note   CSN: [id number] Arrival date & time: 12/16/16  1337     History   Chief Complaint Chief Complaint  Patient presents with  . Migraine  . Emesis    HPI Barbara Jennings is a 35 y.o. female.  The history is provided by the patient.  Migraine  This is a chronic problem. The current episode started 6 to 12 hours ago. The problem occurs hourly. The problem has been gradually worsening. Associated symptoms include headaches. Pertinent negatives include no abdominal pain. Nothing aggravates the symptoms. Nothing relieves the symptoms. She has tried nothing for the symptoms.  Emesis   Associated symptoms include headaches. Pertinent negatives include no abdominal pain.    Past Medical History:  Diagnosis Date  . 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 Abnormal Pap smear   . Abnormal uterine bleeding (AUB) 10/11/2014  . Anxiety   . Asthma   . Depression   . Dysmenorrhea 10/11/2014  . HSV-2 (herpes simplex virus 2) infection   . Hx of migraines 02/09/2014   Has ?aura, will rx POP  . Migraine   . Other and unspecified ovarian cyst 02/06/2014  . Ovarian cyst 10/18/2014  . Panic attack   . Thickened endometrium 10/18/2014  . Unspecified symptom associated with female genital organs 01/27/2014  . Vaginal Pap smear, abnormal   . Vulvar abscess 03/09/2014   [facility] was rx'd bactrim ds x 10 days    Patient Active Problem List   Diagnosis Date Noted  . 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 Combined abdominal and pelvic pain 10/26/2015  . Incisional pain s/p hysterectomy 06/26/2015  . Knee pain, chronic 09/12/2014  . Acute meniscal tear of knee 07/03/2014  . Dysplasia of cervix, unspecified 02/16/2014  . Hx of migraines 02/09/2014  . Abnormal cervical Papanicolaou smear with  positive human papilloma virus (HPV) DNA test 02/02/2014  . Unspecified symptom associated with female genital organs 01/27/2014  . HSV-2 (herpes simplex virus 2) infection 05/26/2013    Past Surgical History:  Procedure Laterality Date  . ABDOMINAL HYSTERECTOMY    . APPENDECTOMY    . CESAREAN SECTION    . CHOLECYSTECTOMY    . DILATION AND CURETTAGE OF UTERUS    . LAPAROSCOPIC SUPRACERVICAL HYSTERECTOMY N/A 01/02/2015   Procedure: ATTEMPTED LAPAROSCOPIC SUPRACERVICAL HYSTERECTOMY CONVERTED TO OPEN AT [id number];  Surgeon: Tilda BurrowJohn Ferguson V, MD;  Location: [facility] ORS;  Service: Gynecology;  Laterality: N/A;  . OVARIAN CYST REMOVAL    . SCAR REVISION N/A 06/26/2015   Procedure: ABDOMINAL SCAR REVISION;  Surgeon: Tilda BurrowJohn Ferguson V, MD;  Location: [facility];  Service: Gynecology;  Laterality: N/A;  . SUPRACERVICAL ABDOMINAL HYSTERECTOMY N/A 01/02/2015   Procedure: HYSTERECTOMY SUPRACERVICAL ABDOMINAL;  Surgeon: Tilda BurrowJohn Ferguson V, MD;  Location: [facility];  Service: Gynecology;  Laterality: N/A;    OB History    Gravida Para Term Preterm AB Living   7 2 1 1 5 1    SAB TAB Ectopic Multiple Live Births   5       1       Home Medications    Prior to Admission medications   Medication Sig Start Date End Date Taking? Authorizing Provider  ALPRAZolam Prudy Feeler(XANAX) 0.5 MG tablet Take 0.5 mg by mouth every 8 (eight) hours as needed for anxiety.  Historical Provider, MD  ondansetron (ZOFRAN ODT) 4 MG disintegrating tablet 4mg  ODT q4 hours prn nausea/vomit Patient not taking: Reported on 11/24/2016 09/17/16   Marily Memos, MD  OxyCODONE HCl 7.5 MG TABA Take 1 tablet by mouth every 6 (six) hours as needed. 11/24/16   Tilda Burrow, MD  phentermine 37.5 MG capsule Take 1 capsule (37.5 mg total) by mouth every morning. 10/24/16   Tilda Burrow, MD    Family History Family History  Problem Relation Age of Onset  . Ulcers Father   . Cancer Maternal Aunt     breast   . Diabetes Maternal Grandmother   . Diabetes  Maternal Grandfather   . Alzheimer's disease Paternal Grandmother   . Diabetes Maternal Aunt   . Cancer Paternal Aunt     cervical    Social History Social History  Substance Use Topics  . Smoking status: Current Every Day Smoker    Packs/day: 0.50    Years: 12.00    Types: Cigarettes  . Smokeless tobacco: Never Used  . Alcohol use No     Comment: socially     Allergies   Coconut flavor   Review of Systems Review of Systems  Eyes: Positive for photophobia.  Gastrointestinal: Positive for vomiting. Negative for abdominal pain.  Neurological: Positive for headaches.  All other systems reviewed and are negative.    Physical Exam Updated Vital Signs BP 117/62 (BP Location: Right Arm)   Pulse 78   Temp 98.6 F (37 C) (Oral)   Resp 18   Ht 5\' 6"  (1.676 m)   Wt 118.8 kg   LMP  (LMP Unknown)   SpO2 99%   BMI 42.29 kg/m   Physical Exam  Constitutional: She is oriented to person, place, and time. She appears well-developed and well-nourished.  Non-toxic appearance.  HENT:  Head: Normocephalic.  Right Ear: Tympanic membrane and external ear normal.  Left Ear: Tympanic membrane and external ear normal.  Eyes: EOM and lids are normal. Pupils are equal, round, and reactive to light.  Neck: Normal range of motion. Neck supple. Carotid bruit is not present.  Cardiovascular: Normal rate, regular rhythm, normal heart sounds, intact distal pulses and normal pulses.   Pulmonary/Chest: Breath sounds normal. No respiratory distress.  Abdominal: Soft. Bowel sounds are normal. There is no tenderness. There is no guarding.  Musculoskeletal: Normal range of motion.  Lymphadenopathy:       Head (right side): No submandibular adenopathy present.       Head (left side): No submandibular adenopathy present.    She has no cervical adenopathy.  Neurological: She is alert and oriented to person, place, and time. She has normal strength. No cranial nerve deficit or sensory deficit.  Coordination normal.  Skin: Skin is warm and dry.  Psychiatric: She has a normal mood and affect. Her speech is normal.  Nursing note and vitals reviewed.    ED Treatments / Results  Labs (all labs ordered are listed, but only abnormal results are displayed) Labs Reviewed - No data to display  EKG  EKG Interpretation None       Radiology No results found.  Procedures Procedures (including critical care time)  Medications Ordered in ED Medications  prochlorperazine (COMPAZINE) injection 5 mg (not administered)  HYDROmorphone (DILAUDID) injection 1 mg (not administered)  sodium chloride 0.9 % bolus 500 mL (not administered)     Initial Impression / Assessment and Plan / ED Course  I have reviewed the triage vital  signs and the nursing notes.  Pertinent labs & imaging results that were available during my care of the patient were reviewed by me and considered in my medical decision making (see chart for details).  Clinical Course     *I have reviewed nursing notes, vital signs, and all appropriate lab and imaging results for this patient.**  Final Clinical Impressions(s) / ED Diagnoses  Vital signs reviewed. There no gross neurologic deficits appreciated on examination. The patient was treated here in the emergency department with intravenous Compazine and no loaded on. Patient was also given a 500 mL bolus of fluid. After the medications and fluid, the patient states she feels much better, and she can manage her headache at home this point. The patient is discharged on home. She will follow-up with her primary physician for reassessment of the medications that she is using an for her headaches.    Final diagnoses:  Migraine without aura and without status migrainosus, not intractable    New Prescriptions New Prescriptions   No medications on file     Barbara QualeHobson Makailey Hodgkin, PA-C 12/16/16 2014    Jacalyn LefevreJulie Haviland, MD 12/16/16 2252

## 2016-12-16 NOTE — Discharge Instructions (Signed)
You were treated tonight with IV nausea medicine and IV pain medicine. Please use caution getting around, as these may make you dizzy or lightheaded. Please see your primary physician for follow-up concerning her headaches. Return to the emergency department if any emergent changes, problems, or concerns.

## 2016-12-16 NOTE — ED Notes (Signed)
Pt states understanding of care given and follow up instructions.  Pt a/o, ambulated from ED with steady gait.  Pt states that she has someone to drive her home.  Informed her to not drive while on pain medication

## 2016-12-24 ENCOUNTER — Encounter: Payer: Self-pay | Admitting: Obstetrics and Gynecology

## 2016-12-24 ENCOUNTER — Ambulatory Visit (INDEPENDENT_AMBULATORY_CARE_PROVIDER_SITE_OTHER): Payer: Medicaid Other | Admitting: Obstetrics and Gynecology

## 2016-12-24 VITALS — BP 131/71 | HR 86 | Wt 260.8 lb

## 2016-12-24 DIAGNOSIS — Z6841 Body Mass Index (BMI) 40.0 and over, adult: Secondary | ICD-10-CM | POA: Diagnosis not present

## 2016-12-24 DIAGNOSIS — G5792 Unspecified mononeuropathy of left lower limb: Secondary | ICD-10-CM

## 2016-12-24 DIAGNOSIS — E6609 Other obesity due to excess calories: Secondary | ICD-10-CM | POA: Diagnosis not present

## 2016-12-24 MED ORDER — OXYCODONE HCL 7.5 MG PO TABS: 1.0000 | ORAL_TABLET | Freq: Four times a day (QID) | ORAL | 0 refills | Status: DC | PRN

## 2016-12-24 NOTE — Progress Notes (Signed)
Patient ID: Barbara Parodyracy M Daubenspeck, female   DOB: 07/10/1981, 35 y.o.   MRN: [id number]    [facility]  @DATE @            Patient name: Barbara Jennings MRN [id number]  Date of birth: 09/23/1981  CC & HPI:   Chief Complaint  Patient presents with  . Follow-up     Barbara Jennings is a 35 y.o. female presenting today for f/u of persistent ilioinguinal nerve neuralgia s/p hysterectomy, unsuccessfully corrected by surgery down to fascia at 6 months post-hysterectomy.   Pt was last seen in the office on 11/24/16, was given a 90 tab refill of Percocet and was again counseled on weight loss.   Pt had nerve block with 2 cc of lidocaine and Marcaine 1:1 on [facility]/16 [facility]. She states this provided her temporary pain relief for a few hours, and discussed repeating the procedure with Dr. Virl SonLoree before considering surgery. Pt is waiting for a call back for a follow up appointment.  And likely scheduling of surgery ant [place] [facility]  Pt states she is not consistently taking Phentermine or working toward weight loss.   ROS:  ROS + persistent RLQ pain   Pertinent History Reviewed:   Reviewed: Significant for hysterectomy  Medical         Past Medical History:  Diagnosis Date  . 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 Abnormal Pap smear   . Abnormal uterine bleeding (AUB) 10/11/2014  . Anxiety   . Asthma   . Depression   . Dysmenorrhea 10/11/2014  . HSV-2 (herpes simplex virus 2) infection   . Hx of migraines 02/09/2014   Has ?aura, will rx POP  . Migraine   . Other and unspecified ovarian cyst 02/06/2014  . Ovarian cyst 10/18/2014  . Panic attack   . Thickened endometrium 10/18/2014  . Unspecified symptom associated with female genital organs 01/27/2014  . Vaginal Pap smear, abnormal   . Vulvar abscess 03/09/2014   [facility] was rx'd bactrim ds x 10 days                               Surgical Hx:    Past Surgical History:  Procedure Laterality Date  . ABDOMINAL HYSTERECTOMY    . APPENDECTOMY    . CESAREAN SECTION    . CHOLECYSTECTOMY    . DILATION AND CURETTAGE OF UTERUS    . LAPAROSCOPIC SUPRACERVICAL HYSTERECTOMY N/A 01/02/2015   Procedure: ATTEMPTED LAPAROSCOPIC SUPRACERVICAL HYSTERECTOMY CONVERTED TO OPEN AT [id number];  Surgeon: Tilda BurrowJohn Renan Danese V, MD;  Location: [facility];  Service: Gynecology;  Laterality: N/A;  . OVARIAN CYST REMOVAL    . SCAR REVISION N/A 06/26/2015   Procedure: ABDOMINAL SCAR REVISION;  Surgeon: Tilda BurrowJohn Dajha Urquilla V, MD;  Location: [facility];  Service: Gynecology;  Laterality: N/A;  . SUPRACERVICAL ABDOMINAL HYSTERECTOMY N/A 01/02/2015   Procedure: HYSTERECTOMY SUPRACERVICAL ABDOMINAL;  Surgeon: Tilda BurrowJohn Kitiara Hintze V, MD;  Location: [facility];  Service: Gynecology;  Laterality: N/A;   Medications: Reviewed & Updated - see associated section                       Current Outpatient Prescriptions:  .  ALPRAZolam (XANAX) 0.5 MG tablet, Take 0.5 mg by mouth every 8 (  eight) hours as needed for anxiety., Disp: , Rfl:  .  OxyCODONE HCl 7.5 MG TABA, Take 1 tablet by mouth every 6 (six) hours as needed., Disp: 120 tablet, Rfl: 0 .  phentermine 37.5 MG capsule, Take 1 capsule (37.5 mg total) by mouth every morning., Disp: 30 capsule, Rfl: 1 .  ondansetron (ZOFRAN ODT) 4 MG disintegrating tablet, 4mg  ODT q4 hours prn nausea/vomit (Patient not taking: Reported on 12/24/2016), Disp: 12 tablet, Rfl: 0   Social History: Reviewed -  reports that she has been smoking Cigarettes.  She has a 6.00 pack-year smoking history. She has never used smokeless tobacco.  Objective Findings:  Vitals: Blood pressure 131/71, pulse 86, weight 260 lb 12.8 oz (118.3 kg).  Physical Examination: Discussion only   Discussion: 1. Discussed with pt benefits of water exercises for 30 minutes a day.   At end of discussion, pt had opportunity to ask questions and has no further  questions at this time.   Specific discussion of weight loss methods as noted above. Greater than 50% was spent in counseling and coordination of care with the patient.   Total time greater than: 25 minutes.     Assessment & Plan:   A:  1. Persistent ilioinguinal nerve neuralgia s/p hysterectomy, unsuccessfully corrected by surgery down to fascia at 6 months post-hysterectomy 2. Temporary pain relief achieved [facility] 3. Pt again counseled on weight loss   P:  1. 120 tabs 7.5 mg Oxycodone refilled  2. Pt to [facility] and consideration of denervation surgery  3. F/u in office in 4 weeks    By signing my name below, I, Doreatha MartinEva Mathews, attest that this documentation has been prepared under the direction and in the presence of Tilda BurrowJohn V Naiomi Musto, MD. Electronically Signed: Doreatha MartinEva Mathews, ED Scribe. 12/24/16. 10:12 AM.  I personally performed the services described in this documentation, which was SCRIBED in my presence. The recorded information has been reviewed and considered accurate. It has been edited as necessary during review. Tilda BurrowFERGUSON,Carrigan Delafuente V, MD

## 2017-01-21 ENCOUNTER — Ambulatory Visit (INDEPENDENT_AMBULATORY_CARE_PROVIDER_SITE_OTHER): Payer: Medicaid Other | Admitting: Obstetrics and Gynecology

## 2017-01-21 ENCOUNTER — Encounter: Payer: Self-pay | Admitting: Obstetrics and Gynecology

## 2017-01-21 ENCOUNTER — Telehealth: Payer: Self-pay | Admitting: Obstetrics and Gynecology

## 2017-01-21 VITALS — BP 140/70 | HR 93 | Wt 266.0 lb

## 2017-01-21 DIAGNOSIS — G5782 Other specified mononeuropathies of left lower limb: Secondary | ICD-10-CM

## 2017-01-21 DIAGNOSIS — R1032 Left lower quadrant pain: Secondary | ICD-10-CM

## 2017-01-21 DIAGNOSIS — Z6841 Body Mass Index (BMI) 40.0 and over, adult: Secondary | ICD-10-CM

## 2017-01-21 DIAGNOSIS — G5792 Unspecified mononeuropathy of left lower limb: Secondary | ICD-10-CM

## 2017-01-21 MED ORDER — OXYCODONE HCL 7.5 MG PO TABS: 1.0000 | ORAL_TABLET | Freq: Four times a day (QID) | ORAL | 0 refills | Status: DC | PRN

## 2017-01-21 NOTE — Telephone Encounter (Signed)
Message left with plastic surgeon's RN to arrange phone call to discuss future plans.

## 2017-01-21 NOTE — Progress Notes (Signed)
[facility] Visit  01/21/17            Patient name: Barbara Jennings MRN [id number]  Date of birth: [birth year]  CC & HPI:  Barbara Jennings is a 36 y.o. female presenting today for f/u of persistent ilioinguinal nerve neuralgia s/p hysterectomy, unsuccessfully corrected by surgery down to fascia at 6 months post-hysterectomy.   She was last seen in this office on 12/24/16 and had Oxycodone 7.5 mg 120 tablets refilled. Her doctor at [facility] stated they cannot perform denervation surgery without definitely knowing where her pain is located.   She has been counseled on weight loss multiple times but she has not been successful.   ROS:  ROS +LLQ pain, persistent   Pertinent History Reviewed:   Reviewed: Significant for abdominal hysterectomy  Medical         Past Medical History:  Diagnosis Date  . 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 Abnormal Pap smear   . Abnormal uterine bleeding (AUB) 10/11/2014  . Anxiety   . Asthma   . Depression   . Dysmenorrhea 10/11/2014  . HSV-2 (herpes simplex virus 2) infection   . Hx of migraines 02/09/2014   Has ?aura, will rx POP  . Migraine   . Other and unspecified ovarian cyst 02/06/2014  . Ovarian cyst 10/18/2014  . Panic attack   . Thickened endometrium 10/18/2014  . Unspecified symptom associated with female genital organs 01/27/2014  . Vaginal Pap smear, abnormal   . Vulvar abscess 03/09/2014   I&D 3/9 in ER at [facility] was rx'd bactrim ds x 10 days                              Surgical Hx:    Past Surgical History:  Procedure Laterality Date  . ABDOMINAL HYSTERECTOMY    . APPENDECTOMY    . CESAREAN SECTION    . CHOLECYSTECTOMY    . DILATION AND CURETTAGE OF UTERUS    . LAPAROSCOPIC SUPRACERVICAL HYSTERECTOMY N/A 01/02/2015   Procedure: ATTEMPTED LAPAROSCOPIC SUPRACERVICAL HYSTERECTOMY CONVERTED TO OPEN AT 7829;  Surgeon: Tilda Burrow, MD;  Location: [facility];  Service: Gynecology;  Laterality: N/A;  . OVARIAN CYST REMOVAL    . SCAR REVISION N/A 06/26/2015   Procedure: ABDOMINAL SCAR REVISION;  Surgeon: Tilda Burrow, MD;  Location: [facility];  Service: Gynecology;  Laterality: N/A;  . SUPRACERVICAL ABDOMINAL HYSTERECTOMY N/A 01/02/2015   Procedure: HYSTERECTOMY SUPRACERVICAL ABDOMINAL;  Surgeon: Tilda Burrow, MD;  Location: [facility];  Service: Gynecology;  Laterality: N/A;   Medications: Reviewed & Updated - see associated section                       Current Outpatient Prescriptions:  .  ALPRAZolam (XANAX) 0.5 MG tablet, Take 0.5 mg by mouth every 8 (eight) hours as needed for anxiety., Disp: , Rfl:  .  OxyCODONE HCl 7.5 MG TABA, Take 1 tablet by mouth every 6 (six) hours as needed., Disp: 120 tablet, Rfl: 0 .  phentermine 37.5 MG capsule, Take 1 capsule (37.5 mg total) by mouth every morning., Disp: 30 capsule, Rfl: 1 .  ondansetron (ZOFRAN ODT) 4 MG disintegrating tablet, 4mg  ODT q4 hours prn nausea/vomit (Patient not taking: Reported on 12/24/2016), Disp: 12 tablet, Rfl: 0   Social  History: Reviewed -  reports that she has been smoking Cigarettes.  She has a 6.00 pack-year smoking history. She has never used smokeless tobacco.  Objective Findings:  Vitals: Blood pressure 140/70, pulse 93, weight 266 lb (120.7 kg).  Physical Examination: not indicated   Assessment & Plan:   A:  1. Persistent ilioinguinal nerve neuralgia s/p hysterectomy, unsuccessfully corrected by surgery down to fascia at 6 months post-hysterectomy 2. Temporary pain relief achieved [facility] 3. Pt again counseled on weight loss   P:  1. Call Dr. Serafina MitchellKalliainan [phone] left message with RN. 2. Follow up in office in 4 weeks.     By signing my name below, I, Sonum Patel, attest that this documentation has been prepared under the direction and in the presence of Tilda BurrowJohn Scotland Dost V, MD. Electronically  Signed: Sonum Patel, Neurosurgeoncribe. 01/21/17. 9:43 AM.  I personally performed the services described in this documentation, which was SCRIBED in my presence. The recorded information has been reviewed and considered accurate. It has been edited as necessary during review. Tilda BurrowFERGUSON,Jameson Morrow V, MD

## 2017-02-19 ENCOUNTER — Other Ambulatory Visit: Payer: Self-pay | Admitting: Obstetrics and Gynecology

## 2017-02-19 ENCOUNTER — Encounter: Payer: Self-pay | Admitting: Obstetrics and Gynecology

## 2017-02-20 ENCOUNTER — Other Ambulatory Visit: Payer: Self-pay | Admitting: Obstetrics and Gynecology

## 2017-02-20 ENCOUNTER — Encounter: Payer: Self-pay | Admitting: Obstetrics and Gynecology

## 2017-02-25 ENCOUNTER — Encounter: Payer: Self-pay | Admitting: Obstetrics and Gynecology

## 2017-02-25 MED ORDER — OXYCODONE HCL 7.5 MG PO TABS: 1.0000 | ORAL_TABLET | Freq: Four times a day (QID) | ORAL | 0 refills | Status: DC | PRN

## 2017-02-25 NOTE — Telephone Encounter (Signed)
Patient is requesting refill on her pain meds, states she can barely walk. She will come in if she needs to be seen. Please advise.

## 2017-02-25 NOTE — Telephone Encounter (Signed)
Pt will need to be seen, she knows this. Because I am out of town beginning Thursday PM, I will fill this once

## 2017-02-26 ENCOUNTER — Encounter: Payer: Self-pay | Admitting: *Deleted

## 2017-02-26 MED ORDER — OXYCODONE HCL 7.5 MG PO TABS: 1.0000 | ORAL_TABLET | Freq: Four times a day (QID) | ORAL | 0 refills | Status: DC | PRN

## 2017-02-26 NOTE — Telephone Encounter (Signed)
Patient left a message informing her that I will fill her oxycodone before I leave town. She is to make an appointment.  If she doesn't , will refer to pain clinic rather than manage pt.

## 2017-03-13 ENCOUNTER — Encounter (HOSPITAL_COMMUNITY): Payer: Self-pay | Admitting: Emergency Medicine

## 2017-03-13 ENCOUNTER — Emergency Department (HOSPITAL_COMMUNITY): Payer: Medicaid Other

## 2017-03-13 ENCOUNTER — Emergency Department (HOSPITAL_COMMUNITY)
Admission: EM | Admit: 2017-03-13 | Discharge: 2017-03-13 | Disposition: A | Discharge status: Home / Self Care | Payer: Medicaid Other | Attending: Emergency Medicine | Admitting: Emergency Medicine

## 2017-03-13 DIAGNOSIS — Y929 Unspecified place or not applicable: Secondary | ICD-10-CM | POA: Insufficient documentation

## 2017-03-13 DIAGNOSIS — W19XXXA Unspecified fall, initial encounter: Secondary | ICD-10-CM

## 2017-03-13 DIAGNOSIS — M25552 Pain in left hip: Secondary | ICD-10-CM | POA: Diagnosis not present

## 2017-03-13 DIAGNOSIS — Y9301 Activity, walking, marching and hiking: Secondary | ICD-10-CM | POA: Diagnosis not present

## 2017-03-13 DIAGNOSIS — W109XXA Fall (on) (from) unspecified stairs and steps, initial encounter: Secondary | ICD-10-CM | POA: Insufficient documentation

## 2017-03-13 DIAGNOSIS — Z79899 Other long term (current) drug therapy: Secondary | ICD-10-CM | POA: Insufficient documentation

## 2017-03-13 DIAGNOSIS — Y999 Unspecified external cause status: Secondary | ICD-10-CM | POA: Diagnosis not present

## 2017-03-13 DIAGNOSIS — F17210 Nicotine dependence, cigarettes, uncomplicated: Secondary | ICD-10-CM | POA: Insufficient documentation

## 2017-03-13 DIAGNOSIS — J45909 Unspecified asthma, uncomplicated: Secondary | ICD-10-CM | POA: Diagnosis not present

## 2017-03-13 DIAGNOSIS — M545 Low back pain: Secondary | ICD-10-CM | POA: Insufficient documentation

## 2017-03-13 DIAGNOSIS — S79912A Unspecified injury of left hip, initial encounter: Secondary | ICD-10-CM | POA: Diagnosis present

## 2017-03-13 HISTORY — DX: Unspecified mononeuropathy of unspecified lower limb: G57.90

## 2017-03-13 MED ORDER — NAPROXEN 250 MG PO TABS
500.0000 mg | ORAL_TABLET | Freq: Once | ORAL | Status: AC
  Administered 2017-03-13: 500 mg via ORAL
  Filled 2017-03-13: qty 2

## 2017-03-13 MED ORDER — METHOCARBAMOL 500 MG PO TABS: 1000.0000 mg | ORAL_TABLET | Freq: Four times a day (QID) | ORAL | 0 refills | Status: DC | PRN

## 2017-03-13 MED ORDER — METHOCARBAMOL 500 MG PO TABS
750.0000 mg | ORAL_TABLET | Freq: Once | ORAL | Status: AC
  Administered 2017-03-13: 750 mg via ORAL
  Filled 2017-03-13: qty 2

## 2017-03-13 NOTE — ED Notes (Signed)
Patient transported to X-ray 

## 2017-03-13 NOTE — Discharge Instructions (Signed)
Take the prescription as directed.  Apply moist heat or ice to the area(s) of discomfort, for 15 minutes at a time, several times per day for the next few days.  Do not fall asleep on a heating or ice pack.  Call your regular medical doctor today to schedule a follow up appointment within the next week.  Return to the Emergency Department immediately if worsening.

## 2017-03-13 NOTE — ED Triage Notes (Signed)
Pt states she was walking down steps last week and fell (down 3 steps). She has been having left hip pain since then with back pain starting 3 days ago. Pt is able to ambulate but has increased pain when she does.

## 2017-03-13 NOTE — ED Provider Notes (Signed)
[facility] Provider Note   CSN: [id number] Arrival date & time: 03/13/17  0730     History   Chief Complaint Chief Complaint  Patient presents with  . Fall    HPI Barbara Jennings is a 36 y.o. female.  HPI  Pt was seen at 0740. Per pt, c/o gradual onset and persistence of constant left hip and low back "pain" for the past 1 week. Pt states her left hip pain began when she slipped and fell down 3 steps 1 week ago. Pt states she fell onto her left hip. LBP has started over the past week. Pain increases with palpation of the area and walking. Pt has been ambulatory since the fall. Denies hitting head, no LOC, no AMS, no neck pain, no abd pain, no CP/SOB, no focal motor weakness, no tingling/numbness in extremities, no saddle anesthesia, no incont/retention of bowel or bladder.    Past Medical History:  Diagnosis Date  . 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 Abnormal Pap smear   . Abnormal uterine bleeding (AUB) 10/11/2014  . Anxiety   . Asthma   . Depression   . Dysmenorrhea 10/11/2014  . HSV-2 (herpes simplex virus 2) infection   . Hx of migraines 02/09/2014   Has ?aura, will rx POP  . Ilioinguinal neuralgia    left  . Migraine   . Other and unspecified ovarian cyst 02/06/2014  . Ovarian cyst 10/18/2014  . Panic attack   . Thickened endometrium 10/18/2014  . Unspecified symptom associated with female genital organs 01/27/2014  . Vaginal Pap smear, abnormal   . Vulvar abscess 03/09/2014   I&D 3/9 in ER at [facility] was rx'd bactrim ds x 10 days    Patient Active Problem List   Diagnosis Date Noted  . 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 Combined abdominal and pelvic pain 10/26/2015  . Incisional pain s/p hysterectomy 06/26/2015  . Knee pain, chronic 09/12/2014  . Acute meniscal tear of knee 07/03/2014  .  Dysplasia of cervix, unspecified 02/16/2014  . Hx of migraines 02/09/2014  . 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 Unspecified symptom associated with female genital organs 01/27/2014  . HSV-2 (herpes simplex virus 2) infection 05/26/2013    Past Surgical History:  Procedure Laterality Date  . ABDOMINAL HYSTERECTOMY    . APPENDECTOMY    . CESAREAN SECTION    . CHOLECYSTECTOMY    . DILATION AND CURETTAGE OF UTERUS    . LAPAROSCOPIC SUPRACERVICAL HYSTERECTOMY N/A 01/02/2015   Procedure: ATTEMPTED LAPAROSCOPIC SUPRACERVICAL HYSTERECTOMY CONVERTED TO OPEN AT 8295;  Surgeon: Tilda Burrow, MD;  Location: [facility];  Service: Gynecology;  Laterality: N/A;  . OVARIAN CYST REMOVAL    . SCAR REVISION N/A 06/26/2015   Procedure: ABDOMINAL SCAR REVISION;  Surgeon: Tilda Burrow, MD;  Location: [facility];  Service: Gynecology;  Laterality: N/A;  . SUPRACERVICAL ABDOMINAL HYSTERECTOMY N/A 01/02/2015   Procedure: HYSTERECTOMY SUPRACERVICAL ABDOMINAL;  Surgeon: Tilda Burrow, MD;  Location: [facility];  Service: Gynecology;  Laterality: N/A;    OB History    Gravida Para Term Preterm AB Living   7 2 1 1 5 1    SAB TAB Ectopic Multiple Live Births   5       1       Home  Medications    Prior to Admission medications   Medication Sig Start Date End Date Taking? Authorizing Provider  ALPRAZolam Prudy Feeler(XANAX) 0.5 MG tablet Take 0.5 mg by mouth every 8 (eight) hours as needed for anxiety.    Historical Provider, MD  ondansetron (ZOFRAN ODT) 4 MG disintegrating tablet 4mg  ODT q4 hours prn nausea/vomit Patient not taking: Reported on 12/24/2016 09/17/16   Marily MemosJason Mesner, MD  OxyCODONE HCl 7.5 MG TABA Take 1 tablet by mouth every 6 (six) hours as needed. 02/26/17   Tilda BurrowJohn Ferguson V, MD  OxyCODONE HCl 7.5 MG TABA Take 1 tablet by mouth every 6 (six) hours as needed. 02/25/17   Tilda BurrowJohn Ferguson V, MD  phentermine 37.5 MG capsule Take 1 capsule (37.5 mg total) by  mouth every morning. 10/24/16   Tilda BurrowJohn Ferguson V, MD    Family History Family History  Problem Relation Age of Onset  . Ulcers Father   . Cancer Maternal Aunt     breast   . Diabetes Maternal Grandmother   . Diabetes Maternal Grandfather   . Alzheimer's disease Paternal Grandmother   . Diabetes Maternal Aunt   . Cancer Paternal Aunt     cervical    Social History Social History  Substance Use Topics  . Smoking status: Current Every Day Smoker    Packs/day: 0.50    Years: 12.00    Types: Cigarettes  . Smokeless tobacco: Never Used  . Alcohol use No     Comment: socially     Allergies   Coconut flavor   Review of Systems Review of Systems ROS: Statement: All systems negative except as marked or noted in the HPI; Constitutional: Negative for fever and chills. ; ; Eyes: Negative for eye pain, redness and discharge. ; ; ENMT: Negative for ear pain, hoarseness, nasal congestion, sinus pressure and sore throat. ; ; Cardiovascular: Negative for chest pain, palpitations, diaphoresis, dyspnea and peripheral edema. ; ; Respiratory: Negative for cough, wheezing and stridor. ; ; Gastrointestinal: Negative for nausea, vomiting, diarrhea, abdominal pain, blood in stool, hematemesis, jaundice and rectal bleeding. . ; ; Genitourinary: Negative for dysuria, flank pain and hematuria. ; ; Musculoskeletal: +LBP, left hip pain. Negative for neck pain. Negative for swelling and deformity.; ; Skin: Negative for pruritus, rash, abrasions, blisters, bruising and skin lesion.; ; Neuro: Negative for headache, lightheadedness and neck stiffness. Negative for weakness, altered level of consciousness, altered mental status, extremity weakness, paresthesias, involuntary movement, seizure and syncope.       Physical Exam Updated Vital Signs BP 134/73 (BP Location: Left Arm)   Pulse 63   Temp 97.8 F (36.6 C) (Oral)   Resp 18   Ht 5\' 6"  (1.676 m)   Wt 242 lb (109.8 kg)   LMP  (LMP Unknown)   SpO2 97%    BMI 39.06 kg/m   Physical Exam 0745: Physical examination:  Nursing notes reviewed; Vital signs and O2 SAT reviewed;  Constitutional: Well developed, Well nourished, Well hydrated, In no acute distress; Head:  Normocephalic, atraumatic; Eyes: EOMI, PERRL, No scleral icterus; ENMT: Mouth and pharynx normal, Mucous membranes moist; Neck: Supple, Full range of motion, No lymphadenopathy; Cardiovascular: Regular rate and rhythm, No murmur, rub, or gallop; Respiratory: Breath sounds clear & equal bilaterally, No rales, rhonchi, wheezes.  Speaking full sentences with ease, Normal respiratory effort/excursion; Chest: Nontender, Movement normal; Abdomen: Soft, Nontender, Nondistended, Normal bowel sounds; Genitourinary: No CVA tenderness; Spine:  No midline CS, TS, LS tenderness. +TTP left lumbar paraspinal muscles.;;  Extremities: Pulses normal, Pelvis stable. +left hip tenderness to palp. No left knee/ankle/foot tenderness. No deformity. No edema, No calf edema or asymmetry.; Neuro: AA&Ox3, Major CN grossly intact.  Speech clear. No gross focal motor or sensory deficits in extremities.; Skin: Color normal, Warm, Dry.   ED Treatments / Results  Labs (all labs ordered are listed, but only abnormal results are displayed)   EKG  EKG Interpretation None       Radiology   Procedures Procedures (including critical care time)  Medications Ordered in ED Medications  methocarbamol (ROBAXIN) tablet 750 mg (not administered)  naproxen (NAPROSYN) tablet 500 mg (not administered)     Initial Impression / Assessment and Plan / ED Course  I have reviewed the triage vital signs and the nursing notes.  Pertinent labs & imaging results that were available during my care of the patient were reviewed by me and considered in my medical decision making (see chart for details).  MDM Reviewed: previous chart, nursing note and vitals Interpretation: x-ray   Dg Lumbar Spine Complete Result Date:  03/13/2017 CLINICAL DATA:  Fall 1 week ago with low back pain, initial encounter EXAM: LUMBAR SPINE - COMPLETE 4+ VIEW COMPARISON:  08/30/2016 FINDINGS: Five lumbar type vertebral bodies are well visualized. Vertebral body height is well maintained. Mild disc space narrowing is noted at L4-5 and L5-S1 with mild osteophytic changes. No soft tissue changes are seen. IMPRESSION: Mild degenerative change without acute abnormality. Electronically Signed   By: Alcide Clever M.D.   On: 03/13/2017 08:51   Dg Hip Unilat With Pelvis 2-3 Views Left Result Date: 03/13/2017 CLINICAL DATA:  Fall 1 week ago with persistent left hip pain, initial encounter EXAM: DG HIP (WITH OR WITHOUT PELVIS) 2-3V LEFT COMPARISON:  None. FINDINGS: The pelvic ring is intact. No acute fracture or dislocation is noted. Postsurgical changes are seen. No soft tissue abnormality is noted. IMPRESSION: No acute abnormality seen. Electronically Signed   By: Alcide Clever M.D.   On: 03/13/2017 08:50    0915:  XR reassuring. Tx symptomatically at this time. Dx and testing d/w pt.  Questions answered.  Verb understanding, agreeable to d/c home with outpt f/u.   Final Clinical Impressions(s) / ED Diagnoses   Final diagnoses:  None    New Prescriptions New Prescriptions   No medications on file     Samuel Jester, DO 03/15/17 1603

## 2017-03-30 ENCOUNTER — Ambulatory Visit: Payer: Medicaid Other | Admitting: Obstetrics and Gynecology

## 2017-04-01 ENCOUNTER — Encounter: Payer: Self-pay | Admitting: Obstetrics and Gynecology

## 2017-04-01 ENCOUNTER — Ambulatory Visit (INDEPENDENT_AMBULATORY_CARE_PROVIDER_SITE_OTHER): Payer: Medicaid Other | Admitting: Obstetrics and Gynecology

## 2017-04-01 VITALS — BP 118/72 | HR 77 | Wt 278.6 lb

## 2017-04-01 DIAGNOSIS — L7682 Other postprocedural complications of skin and subcutaneous tissue: Secondary | ICD-10-CM

## 2017-04-01 DIAGNOSIS — G5792 Unspecified mononeuropathy of left lower limb: Secondary | ICD-10-CM

## 2017-04-01 DIAGNOSIS — R208 Other disturbances of skin sensation: Secondary | ICD-10-CM

## 2017-04-01 MED ORDER — PHENTERMINE HCL 37.5 MG PO CAPS: 37.5000 mg | ORAL_CAPSULE | ORAL | 1 refills | Status: DC

## 2017-04-01 MED ORDER — OXYCODONE HCL 7.5 MG PO TABS: 1.0000 | ORAL_TABLET | Freq: Four times a day (QID) | ORAL | 0 refills | Status: DC | PRN

## 2017-04-01 NOTE — Progress Notes (Signed)
[facility] Clinic Visit  @            Patient name: Barbara Jennings MRN [id number]  Date of birth: 1981/12/13  CC & HPI:  Barbara Jennings is a 36 y.o. female presenting today for medication refill. She currently takes oxycodone 7.5 mg for ongoing ilioinguinal nerve neuralgia s/p hysterectomy, unsuccessfully corrected by surgery down to fascia at 6 months post-hysterectomy. She was last seen in this office on 01/21/17 at which point she noted having temporary pain relief by a nerve inject testing at [facility] on 12/19/16. She was told by her doctor at [facility] that they cannot perform denervation surgery without definitely knowing where her pain is located. She attempted to make a follow up appointment with them but was told that since they cannot pinpoint her pain the visit would not be very helpful.   She has been counseled on weight loss multiple times but she has not been actively working on this. She states she walks 3 times a week but states she does not use a step counter on her phone as she does not take this with her. She also reports having a new watch that tracks steps/exercise but notes she does not wear it everyday. She has been prescribed phentermine but notes she has not taken this in the last 1 week.    ROS:  ROS +LLQ pain, persistent   Pertinent History Reviewed:   Reviewed: Significant for supracervical abdominal hysterectomy with abdominal scar revision 6 months later Medical         Past Medical History:  Diagnosis Date  . 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 Abnormal Pap smear   . Abnormal uterine bleeding (AUB) 10/11/2014  . Anxiety   . Asthma   . Depression   . Dysmenorrhea 10/11/2014  . HSV-2 (herpes simplex virus 2) infection   . Hx of migraines 02/09/2014   Has ?aura, will rx POP  . Ilioinguinal neuralgia    left  . Migraine   . Other and unspecified ovarian  cyst 02/06/2014  . Ovarian cyst 10/18/2014  . Panic attack   . Thickened endometrium 10/18/2014  . Unspecified symptom associated with female genital organs 01/27/2014  . Vaginal Pap smear, abnormal   . Vulvar abscess 03/09/2014   I&D 3/9 in ER at [facility] was rx'd bactrim ds x 10 days                              Surgical Hx:    Past Surgical History:  Procedure Laterality Date  . ABDOMINAL HYSTERECTOMY    . APPENDECTOMY    . CESAREAN SECTION    . CHOLECYSTECTOMY    . DILATION AND CURETTAGE OF UTERUS    . LAPAROSCOPIC SUPRACERVICAL HYSTERECTOMY N/A 01/02/2015   Procedure: ATTEMPTED LAPAROSCOPIC SUPRACERVICAL HYSTERECTOMY CONVERTED TO OPEN AT 7829;  Surgeon: Tilda Burrow, MD;  Location: [facility];  Service: Gynecology;  Laterality: N/A;  . OVARIAN CYST REMOVAL    . SCAR REVISION N/A 06/26/2015   Procedure: ABDOMINAL SCAR REVISION;  Surgeon: Tilda Burrow, MD;  Location: [facility];  Service: Gynecology;  Laterality: N/A;  . SUPRACERVICAL ABDOMINAL HYSTERECTOMY N/A 01/02/2015   Procedure: HYSTERECTOMY SUPRACERVICAL ABDOMINAL;  Surgeon: Tilda Burrow, MD;  Location: [facility];  Service: Gynecology;  Laterality: N/A;   Medications: Reviewed &  Updated - see associated section                       Current Outpatient Prescriptions:  .  ALPRAZolam (XANAX) 0.5 MG tablet, Take 0.5 mg by mouth every 8 (eight) hours as needed for anxiety., Disp: , Rfl:  .  OxyCODONE HCl 7.5 MG TABA, Take 1 tablet by mouth every 6 (six) hours as needed., Disp: 120 tablet, Rfl: 0 .  phentermine 37.5 MG capsule, Take 1 capsule (37.5 mg total) by mouth every morning. (Patient not taking: Reported on 04/01/2017), Disp: 30 capsule, Rfl: 1   Social History: Reviewed -  reports that she has been smoking E-cigarettes.  She has been smoking about 0.00 packs per day for the past 12.00 years. She has never used smokeless tobacco.  Objective Findings:  Vitals: Blood pressure 118/72, pulse 77, weight 278 lb 9.6 oz (126.4  kg).  Physical Examination: General appearance - alert, well appearing, and in no distress Mental status - alert, oriented to person, place, and time Pelvic: not indicated at this time  Counseled, again, on need for weight loss to better determine pain. Her weight was 266 on 01/21/17 and today is 278.   Assessment & Plan:   A:  1. Persistent ilioinguinal nerve neuralgia s/p hysterectomy, unsuccessfully corrected by surgery down to fascia at 6 months post-hysterectomy 2. Obesity, worsened  P:  1. Counseled again on weight loss 2. Refilled oxycodone 7.5mg   3. Refilled phentermine    By signing my name below, I, Sonum Patel, attest that this documentation has been prepared under the direction and in the presence of Tilda Burrow, MD. Electronically Signed: Sonum Patel, Neurosurgeon. 04/01/17. 10:19 AM.  I personally performed the services described in this documentation, which was SCRIBED in my presence. The recorded information has been reviewed and considered accurate. It has been edited as necessary during review. Tilda Burrow, MD

## 2017-04-08 ENCOUNTER — Emergency Department (HOSPITAL_COMMUNITY)
Admission: EM | Admit: 2017-04-08 | Discharge: 2017-04-08 | Disposition: A | Discharge status: Home / Self Care | Payer: Medicaid Other | Attending: Emergency Medicine | Admitting: Emergency Medicine

## 2017-04-08 ENCOUNTER — Emergency Department (HOSPITAL_COMMUNITY): Payer: Medicaid Other

## 2017-04-08 ENCOUNTER — Encounter (HOSPITAL_COMMUNITY): Payer: Self-pay

## 2017-04-08 DIAGNOSIS — R197 Diarrhea, unspecified: Secondary | ICD-10-CM | POA: Insufficient documentation

## 2017-04-08 DIAGNOSIS — R6883 Chills (without fever): Secondary | ICD-10-CM | POA: Insufficient documentation

## 2017-04-08 DIAGNOSIS — J45909 Unspecified asthma, uncomplicated: Secondary | ICD-10-CM | POA: Diagnosis not present

## 2017-04-08 DIAGNOSIS — F17210 Nicotine dependence, cigarettes, uncomplicated: Secondary | ICD-10-CM | POA: Insufficient documentation

## 2017-04-08 DIAGNOSIS — R112 Nausea with vomiting, unspecified: Secondary | ICD-10-CM

## 2017-04-08 DIAGNOSIS — Z79899 Other long term (current) drug therapy: Secondary | ICD-10-CM | POA: Diagnosis not present

## 2017-04-08 DIAGNOSIS — Z20828 Contact with and (suspected) exposure to other viral communicable diseases: Secondary | ICD-10-CM | POA: Insufficient documentation

## 2017-04-08 DIAGNOSIS — R1030 Lower abdominal pain, unspecified: Secondary | ICD-10-CM | POA: Diagnosis not present

## 2017-04-08 LAB — URINALYSIS, ROUTINE W REFLEX MICROSCOPIC
BILIRUBIN URINE: NEGATIVE
Bacteria, UA: NONE SEEN
GLUCOSE, UA: NEGATIVE mg/dL
Hgb urine dipstick: NEGATIVE
KETONES UR: 20 mg/dL — AB
LEUKOCYTES UA: NEGATIVE
Nitrite: NEGATIVE
PROTEIN: 30 mg/dL — AB
Specific Gravity, Urine: 1.033 — ABNORMAL HIGH (ref 1.005–1.030)
pH: 5 (ref 5.0–8.0)

## 2017-04-08 LAB — COMPREHENSIVE METABOLIC PANEL
ALT: 22 U/L (ref 14–54)
ANION GAP: 10 (ref 5–15)
AST: 17 U/L (ref 15–41)
Albumin: 4.3 g/dL (ref 3.5–5.0)
Alkaline Phosphatase: 69 U/L (ref 38–126)
BUN: 7 mg/dL (ref 6–20)
CHLORIDE: 103 mmol/L (ref 101–111)
CO2: 26 mmol/L (ref 22–32)
Calcium: 9.5 mg/dL (ref 8.9–10.3)
Creatinine, Ser: 0.56 mg/dL (ref 0.44–1.00)
GFR calc Af Amer: >60 mL/min (ref >60)
GFR calc non Af Amer: >60 mL/min (ref >60)
Glucose, Bld: 95 mg/dL (ref 65–99)
Potassium: 3.5 mmol/L (ref 3.5–5.1)
SODIUM: 139 mmol/L (ref 135–145)
Total Bilirubin: 1.1 mg/dL (ref 0.3–1.2)
Total Protein: 7.9 g/dL (ref 6.5–8.1)

## 2017-04-08 LAB — CBC
HCT: 40.2 % (ref 36.0–46.0)
HEMOGLOBIN: 13.9 g/dL (ref 12.0–15.0)
MCH: 29.3 pg (ref 26.0–34.0)
MCHC: 34.6 g/dL (ref 30.0–36.0)
MCV: 84.8 fL (ref 78.0–100.0)
Platelets: 346 10*3/uL (ref 150–400)
RBC: 4.74 MIL/uL (ref 3.87–5.11)
RDW: 12.9 % (ref 11.5–15.5)
WBC: 11.1 10*3/uL — AB (ref 4.0–10.5)

## 2017-04-08 LAB — LIPASE, BLOOD: LIPASE: 19 U/L (ref 11–51)

## 2017-04-08 MED ORDER — ONDANSETRON 8 MG PO TBDP
8.0000 mg | ORAL_TABLET | Freq: Once | ORAL | Status: AC
  Administered 2017-04-08: 8 mg via ORAL
  Filled 2017-04-08: qty 1

## 2017-04-08 MED ORDER — ONDANSETRON 4 MG PO TBDP: 4.0000 mg | ORAL_TABLET | Freq: Three times a day (TID) | ORAL | 0 refills | Status: DC | PRN

## 2017-04-08 MED ORDER — PROMETHAZINE HCL 25 MG/ML IJ SOLN
25.0000 mg | Freq: Once | INTRAMUSCULAR | Status: AC
  Administered 2017-04-08: 25 mg via INTRAMUSCULAR
  Filled 2017-04-08: qty 1

## 2017-04-08 MED ORDER — OSELTAMIVIR PHOSPHATE 75 MG PO CAPS: 75.0000 mg | ORAL_CAPSULE | Freq: Two times a day (BID) | ORAL | 0 refills | Status: DC

## 2017-04-08 MED ORDER — DICYCLOMINE HCL 10 MG/ML IM SOLN
20.0000 mg | Freq: Once | INTRAMUSCULAR | Status: AC
  Administered 2017-04-08: 20 mg via INTRAMUSCULAR
  Filled 2017-04-08: qty 2

## 2017-04-08 NOTE — Discharge Instructions (Signed)
Take the prescriptions as directed.  Increase your fluid intake (ie:  Gatoraide) for the next few days, as discussed.  Eat a bland diet and advance to your regular diet slowly as you can tolerate it.   Avoid full strength juices, as well as milk and milk products until your diarrhea has resolved.  Call your regular medical doctor tomorrow to schedule a follow up appointment this week.  Return to the Emergency Department immediately if not improving (or even worsening) despite taking the medicines as prescribed, any black or bloody stool or vomit, if you develop a fever over "101," or for any other concerns. ° °

## 2017-04-08 NOTE — ED Triage Notes (Signed)
Pt reports she woke up at 0530 this morning with vomiting and diarrhea. States body hurts and is having chills. Children are being treated for Flu A

## 2017-04-08 NOTE — ED Provider Notes (Signed)
[facility]PT Provider Note   CSN: [id number] Arrival date & time: 04/08/17  1643     History   Chief Complaint Chief Complaint  Patient presents with  . Emesis    HPI ONESTY CLAIR is a 36 y.o. female.  HPI  Pt was seen at 1820. Per pt, c/o gradual onset and persistence of multiple intermittent episodes of N/V/D that began this morning approximately 0500.   Describes the stools as "watery." Has been associated with generalized body aches and "chills." States multiple others in household with similar symptoms, one person dx influenza A. Denies any change in her usual chronic abd pain, no CP/SOB, no back pain, no objective fevers, no black or blood in stools or emesis.    Past Medical History:  Diagnosis Date  . 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 Abnormal Pap smear   . Abnormal uterine bleeding (AUB) 10/11/2014  . Anxiety   . Asthma   . Depression   . Dysmenorrhea 10/11/2014  . HSV-2 (herpes simplex virus 2) infection   . Hx of migraines 02/09/2014   Has ?aura, will rx POP  . Ilioinguinal neuralgia    left  . Migraine   . Other and unspecified ovarian cyst 02/06/2014  . Ovarian cyst 10/18/2014  . Panic attack   . Thickened endometrium 10/18/2014  . Unspecified symptom associated with female genital organs 01/27/2014  . Vaginal Pap smear, abnormal   . Vulvar abscess 03/09/2014   I&D 3/9 in ER at [facility] was rx'd bactrim ds x 10 days    Patient Active Problem List   Diagnosis Date Noted  . 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 Combined abdominal and pelvic pain 10/26/2015  . Incisional pain s/p hysterectomy 06/26/2015  . Knee pain, chronic 09/12/2014  . Acute meniscal tear of knee 07/03/2014  . Dysplasia of cervix, unspecified 02/16/2014  . Hx of migraines 02/09/2014  . Abnormal cervical Papanicolaou  smear with positive human papilloma virus (HPV) DNA test 02/02/2014  . Unspecified symptom associated with female genital organs 01/27/2014  . HSV-2 (herpes simplex virus 2) infection 05/26/2013    Past Surgical History:  Procedure Laterality Date  . ABDOMINAL HYSTERECTOMY    . APPENDECTOMY    . CESAREAN SECTION    . CHOLECYSTECTOMY    . DILATION AND CURETTAGE OF UTERUS    . LAPAROSCOPIC SUPRACERVICAL HYSTERECTOMY N/A 01/02/2015   Procedure: ATTEMPTED LAPAROSCOPIC SUPRACERVICAL HYSTERECTOMY CONVERTED TO OPEN AT 2130;  Surgeon: Tilda Burrow, MD;  Location: [facility] ORS;  Service: Gynecology;  Laterality: N/A;  . OVARIAN CYST REMOVAL    . SCAR REVISION N/A 06/26/2015   Procedure: ABDOMINAL SCAR REVISION;  Surgeon: Tilda Burrow, MD;  Location: [facility] ORS;  Service: Gynecology;  Laterality: N/A;  . SUPRACERVICAL ABDOMINAL HYSTERECTOMY N/A 01/02/2015   Procedure: HYSTERECTOMY SUPRACERVICAL ABDOMINAL;  Surgeon: Tilda Burrow, MD;  Location: [facility] ORS;  Service: Gynecology;  Laterality: N/A;    OB History    Gravida Para Term Preterm AB Living   SAB TAB Ectopic Multiple Live Births   5       1       Home Medications    Prior to Admission medications   Medication Sig Start Date End Date Taking? Authorizing Provider  ALPRAZolam Prudy Feeler) 0.5 MG tablet Take 0.5 mg  by mouth every 8 (eight) hours as needed for anxiety.    Historical Provider, MD  OxyCODONE HCl 7.5 MG TABA Take 1 tablet by mouth every 6 (six) hours as needed. 04/01/17   Tilda Burrow, MD  phentermine 37.5 MG capsule Take 1 capsule (37.5 mg total) by mouth every morning. 04/01/17   Tilda Burrow, MD    Family History Family History  Problem Relation Age of Onset  . Ulcers Father   . Cancer Maternal Aunt     breast   . Diabetes Maternal Grandmother   . Diabetes Maternal Grandfather   . Alzheimer's disease Paternal Grandmother   . Diabetes Maternal Aunt   . Cancer Paternal Aunt     cervical    Social  History Social History  Substance Use Topics  . Smoking status: Current Every Day Smoker    Packs/day: 0.00    Years: 12.00    Types: E-cigarettes  . Smokeless tobacco: Never Used  . Alcohol use No     Comment: socially     Allergies   Coconut flavor   Review of Systems Review of Systems ROS: Statement: All systems negative except as marked or noted in the HPI; Constitutional: Negative for objective fever and +generalized body aches, +chills. ; ; Eyes: Negative for eye pain, redness and discharge. ; ; ENMT: Negative for ear pain, hoarseness, nasal congestion, sinus pressure and sore throat. ; ; Cardiovascular: Negative for chest pain, palpitations, diaphoresis, dyspnea and peripheral edema. ; ; Respiratory: Negative for cough, wheezing and stridor. ; ; Gastrointestinal: +N/V/D. Negative for abdominal pain, blood in stool, hematemesis, jaundice and rectal bleeding. . ; ; Genitourinary: Negative for dysuria, flank pain and hematuria. ; ; Musculoskeletal: Negative for back pain and neck pain. Negative for swelling and trauma.; ; Skin: Negative for pruritus, rash, abrasions, blisters, bruising and skin lesion.; ; Neuro: Negative for headache, lightheadedness and neck stiffness. Negative for weakness, altered level of consciousness, altered mental status, extremity weakness, paresthesias, involuntary movement, seizure and syncope.       Physical Exam Updated Vital Signs BP 113/79 (BP Location: Right Arm)   Pulse 63   Temp 98 F (36.7 C) (Oral)   Resp 16   Ht  (1.676 m)   Wt 274 lb (124.3 kg)   LMP  (LMP Unknown)   SpO2 98%   BMI 44.22 kg/m   Physical Exam 1825: Physical examination:  Nursing notes reviewed; Vital signs and O2 SAT reviewed;  Constitutional: Well developed, Well nourished, Well hydrated, In no acute distress; Head:  Normocephalic, atraumatic; Eyes: EOMI, PERRL, No scleral icterus; ENMT: Mouth and pharynx normal, Mucous membranes moist; Neck: Supple, Full range  of motion, No lymphadenopathy; Cardiovascular: Regular rate and rhythm, No gallop; Respiratory: Breath sounds clear & equal bilaterally, No wheezes.  Speaking full sentences with ease, Normal respiratory effort/excursion; Chest: Nontender, Movement normal; Abdomen: Soft, +lower abd tenderness to palp (per hx). No rebound or guarding. Nondistended, Normal bowel sounds; Genitourinary: No CVA tenderness; Extremities: Pulses normal, No tenderness, No edema, No calf edema or asymmetry.; Neuro: AA&Ox3, Major CN grossly intact.  Speech clear. No gross focal motor or sensory deficits in extremities.; Skin: Color normal, Warm, Dry.   ED Treatments / Results  Labs (all labs ordered are listed, but only abnormal results are displayed)   EKG  EKG Interpretation None       Radiology   Procedures Procedures (including critical care time)  Medications Ordered in ED Medications  dicyclomine (BENTYL) injection  20 mg (20 mg Intramuscular Given 04/08/17 1830)  promethazine (PHENERGAN) injection 25 mg (25 mg Intramuscular Given 04/08/17 1830)     Initial Impression / Assessment and Plan / ED Course  I have reviewed the triage vital signs and the nursing notes.  Pertinent labs & imaging results that were available during my care of the patient were reviewed by me and considered in my medical decision making (see chart for details).  MDM Reviewed: nursing note, vitals and previous chart Reviewed previous: labs and CT scan Interpretation: x-ray and labs    Results for orders placed or performed during the hospital encounter of 04/08/17  Lipase, blood  Result Value Ref Range   Lipase 19 11 - 51 U/L  Comprehensive metabolic panel  Result Value Ref Range   Sodium 139 135 - 145 mmol/L   Potassium 3.5 3.5 - 5.1 mmol/L   Chloride 103 101 - 111 mmol/L   CO2 26 22 - 32 mmol/L   Glucose, Bld 95 65 - 99 mg/dL   BUN 7 6 - 20 mg/dL   Creatinine, Ser 1.61 0.44 - 1.00 mg/dL   Calcium 9.5 8.9 - 09.6  mg/dL   Total Protein 7.9 6.5 - 8.1 g/dL   Albumin 4.3 3.5 - 5.0 g/dL   AST 17 15 - 41 U/L   ALT 22 14 - 54 U/L   Alkaline Phosphatase 69 38 - 126 U/L   Total Bilirubin 1.1 0.3 - 1.2 mg/dL   GFR calc non Af Amer >60 >60 mL/min   GFR calc Af Amer >60 >60 mL/min   Anion gap 10 5 - 15  CBC  Result Value Ref Range   WBC 11.1 (H) 4.0 - 10.5 K/uL   RBC 4.74 3.87 - 5.11 MIL/uL   Hemoglobin 13.9 12.0 - 15.0 g/dL   HCT 04.5 40.9 - 81.1 %   MCV 84.8 78.0 - 100.0 fL   MCH 29.3 26.0 - 34.0 pg   MCHC 34.6 30.0 - 36.0 g/dL   RDW 91.4 78.2 - 95.6 %   Platelets 346 150 - 400 K/uL  Urinalysis, Routine w reflex microscopic  Result Value Ref Range   Color, Urine AMBER (A) YELLOW   APPearance HAZY (A) CLEAR   Specific Gravity, Urine 1.033 (H) 1.005 - 1.030   pH 5.0 5.0 - 8.0   Glucose, UA NEGATIVE NEGATIVE mg/dL   Hgb urine dipstick NEGATIVE NEGATIVE   Bilirubin Urine NEGATIVE NEGATIVE   Ketones, ur 20 (A) NEGATIVE mg/dL   Protein, ur 30 (A) NEGATIVE mg/dL   Nitrite NEGATIVE NEGATIVE   Leukocytes, UA NEGATIVE NEGATIVE   RBC / HPF 0-5 0 - 5 RBC/hpf   WBC, UA 0-5 0 - 5 WBC/hpf   Bacteria, UA NONE SEEN NONE SEEN   Squamous Epithelial / LPF 0-5 (A) NONE SEEN   Mucous PRESENT     Dg Abd Acute W/chest Result Date: 04/08/2017 CLINICAL DATA:  Abdominal pain, fever, vomiting, and diarrhea for 1 day EXAM: DG ABDOMEN ACUTE W/ 1V CHEST COMPARISON:  09/17/2016 and 08/30/2016 FINDINGS: There is no evidence of dilated bowel loops or free intraperitoneal air. No radiopaque calculi or other significant radiographic abnormality is seen. Surgical clips again seen within the pelvis and right upper quadrant. Heart size and mediastinal contours are within normal limits. Both lungs are clear. IMPRESSION: Negative abdominal radiographs.  No active cardiopulmonary disease. Electronically Signed   By: Myles Rosenthal M.D.   On: 04/08/2017 19:01    2120:  Pt has  tol PO well while in the ED without N/V.  No stooling while  in the ED.  Abd unchanged, VSS. Feels better and wants to go home now. Dx and testing d/w pt.  Questions answered.  Verb understanding, agreeable to d/c home with outpt f/u.    Final Clinical Impressions(s) / ED Diagnoses   Final diagnoses:  None    New Prescriptions New Prescriptions   No medications on file     Samuel Jester, DO 04/15/17 1010

## 2017-04-08 NOTE — ED Notes (Signed)
Pt ambulatory to waiting room. Pt verbalized understanding of discharge instructions.   

## 2017-04-08 NOTE — ED Notes (Signed)
Pt stated she did not feel like she could drink anything.

## 2017-04-29 ENCOUNTER — Ambulatory Visit (INDEPENDENT_AMBULATORY_CARE_PROVIDER_SITE_OTHER): Payer: Medicaid Other | Admitting: Obstetrics and Gynecology

## 2017-04-29 ENCOUNTER — Encounter: Payer: Self-pay | Admitting: Obstetrics and Gynecology

## 2017-04-29 VITALS — BP 138/82 | HR 76 | Ht 66.0 in | Wt 278.8 lb

## 2017-04-29 DIAGNOSIS — G5792 Unspecified mononeuropathy of left lower limb: Secondary | ICD-10-CM

## 2017-04-29 MED ORDER — OXYCODONE HCL 7.5 MG PO TABS: 1.0000 | ORAL_TABLET | Freq: Four times a day (QID) | ORAL | 0 refills | Status: DC | PRN

## 2017-04-29 NOTE — Progress Notes (Signed)
[facility] Clinic Visit  @            Patient name: Barbara Jennings MRN [id number]  Date of birth: Aug 10, 1981  CC & HPI:  Barbara Jennings is a 36 y.o. female presenting today for medication refill. She currently takes oxycodone 7.5 mg for ongoing ilioinguinal neuralgia s/p hysterectomy, unsuccessfully corrected by surgery down to fascia at 6 months post-hysterectomy. She was last seen in the office on 04/01/17. She has tried a muscle relaxer and gabapentin without relief. She occasionally takes ibuprofen/Tylenol though this is not regular. She has an abdominal binder that she wears usually in the afternoon or with increased activity.   ROS:  ROS +bilateral abdominal pain -fever  Pertinent History Reviewed:   Reviewed: Significant for left ilioinguinal neuralgia  Medical         Past Medical History:  Diagnosis Date  . 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 Abnormal Pap smear   . Abnormal uterine bleeding (AUB) 10/11/2014  . Anxiety   . Asthma   . Depression   . Dysmenorrhea 10/11/2014  . HSV-2 (herpes simplex virus 2) infection   . Hx of migraines 02/09/2014   Has ?aura, will rx POP  . Ilioinguinal neuralgia    left  . Migraine   . Other and unspecified ovarian cyst 02/06/2014  . Ovarian cyst 10/18/2014  . Panic attack   . Thickened endometrium 10/18/2014  . Unspecified symptom associated with female genital organs 01/27/2014  . Vaginal Pap smear, abnormal   . Vulvar abscess 03/09/2014   I&D 3/9 in ER at [facility] was rx'd bactrim ds x 10 days                              Surgical Hx:    Past Surgical History:  Procedure Laterality Date  . ABDOMINAL HYSTERECTOMY    . APPENDECTOMY    . CESAREAN SECTION    . CHOLECYSTECTOMY    . DILATION AND CURETTAGE OF UTERUS    . LAPAROSCOPIC SUPRACERVICAL HYSTERECTOMY N/A 01/02/2015   Procedure: ATTEMPTED LAPAROSCOPIC SUPRACERVICAL  HYSTERECTOMY CONVERTED TO OPEN AT 1478;  Surgeon: Tilda Burrow, MD;  Location: [facility];  Service: Gynecology;  Laterality: N/A;  . OVARIAN CYST REMOVAL    . SCAR REVISION N/A 06/26/2015   Procedure: ABDOMINAL SCAR REVISION;  Surgeon: Tilda Burrow, MD;  Location: [facility] OR[facility];  Service: Gynecology;  Laterality: N/A;  . SUPRACERVICAL ABDOMINAL HYSTERECTOMY N/A 01/02/2015   Procedure: HYSTERECTOMY SUPRACERVICAL ABDOMINAL;  Surgeon: Tilda Burrow, MD;  Location: [facility] ORS;  Service: Gynecology;  Laterality: N/A;   Medications: Reviewed & Updated - see associated section                       Current Outpatient Prescriptions:  .  ALPRAZolam (XANAX) 0.5 MG tablet, Take 0.5 mg by mouth every 8 (eight) hours as needed for anxiety., Disp: , Rfl:  .  OxyCODONE HCl 7.5 MG TABA, Take 1 tablet by mouth every 6 (six) hours as needed. (Patient taking differently: Take 1 tablet by mouth every 6 (six) hours as needed (for pain). ), Disp: 120 tablet, Rfl: 0 .  phentermine 37.5 MG capsule, Take 1 capsule (37.5 mg total) by mouth every morning., Disp: 30 capsule, Rfl: 1 .  acetaminophen (TYLENOL) 500 MG tablet,  Take 500 mg by mouth every 6 (six) hours as needed for mild pain or moderate pain., Disp: , Rfl:  .  ondansetron (ZOFRAN ODT) 4 MG disintegrating tablet, Take 1 tablet (4 mg total) by mouth every 8 (eight) hours as needed for nausea or vomiting. (Patient not taking: Reported on 04/29/2017), Disp: 6 tablet, Rfl: 0 .  oseltamivir (TAMIFLU) 75 MG capsule, Take 1 capsule (75 mg total) by mouth every 12 (twelve) hours. (Patient not taking: Reported on 04/29/2017), Disp: 10 capsule, Rfl: 0   Social History: Reviewed -  reports that she has been smoking E-cigarettes.  She has been smoking about 0.00 packs per day for the past 12.00 years. She has never used smokeless tobacco.  Objective Findings:  Vitals: Blood pressure 138/82, pulse 76, height  (1.676 m), weight 278 lb 12.8 oz (126.5 kg).  Physical Examination:  General appearance - alert, well appearing, and in no distress Mental status - alert, oriented to person, place, and time Pelvic - examination not indicated  Again, counseled on need for weight loss to determine specific area of pain. Detailed review of patient's past CT scans to motivate patient. Weight today is 278, same as last visit on 04/01/17.    Assessment & Plan:   A:  1. Persistent ilioinguinal nerve neuralgia s/p hysterectomy, unsuccessfully corrected by surgery down to fascia at 6 months post-hysterectomy 2. Obesity, worsened   P:  1. Counseled again to lose weight 2. Refilled oxycodone 7.5mg   3 will attempt to refer to [facility] for f/u  By signing my name below, I, Sonum Patel, attest that this documentation has been prepared under the direction and in the presence of Tilda Burrow, MD. Electronically Signed: Sonum Patel, Neurosurgeon. 04/29/17. 9:54 AM.  ,jgs I personally performed the services described in this documentation, which was SCRIBED in my presence. The recorded information has been reviewed and considered accurate. It has been edited as necessary during review. Tilda Burrow, MD

## 2017-06-01 ENCOUNTER — Encounter: Payer: Self-pay | Admitting: Obstetrics and Gynecology

## 2017-06-01 ENCOUNTER — Ambulatory Visit (INDEPENDENT_AMBULATORY_CARE_PROVIDER_SITE_OTHER): Payer: Medicaid Other | Admitting: Obstetrics and Gynecology

## 2017-06-01 VITALS — BP 124/78 | HR 75 | Wt 276.0 lb

## 2017-06-01 DIAGNOSIS — G5792 Unspecified mononeuropathy of left lower limb: Secondary | ICD-10-CM

## 2017-06-01 MED ORDER — OXYCODONE HCL 7.5 MG PO TABS: 1.0000 | ORAL_TABLET | Freq: Four times a day (QID) | ORAL | 0 refills | Status: DC | PRN

## 2017-06-01 MED ORDER — PHENTERMINE HCL 37.5 MG PO CAPS: 37.5000 mg | ORAL_CAPSULE | ORAL | 1 refills | Status: DC

## 2017-06-01 NOTE — Progress Notes (Signed)
[facility] Clinic Visit  @DATE @            Patient name: Barbara Jennings MRN [id number]  Date of birth: Dec 14, 1981  CC & HPI:  KEYOSHA TIEDT is a 36 y.o. 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 Patient is an active due to the pain and weight remains an issue. Current weight is 276 today she's been on phentermine daily for the last 30 days. It was 266 in January and 278 1 month ago. I will not continue the phentermine because for less than 4 pound weight loss on tamoxifen treatment and is considered treatment failure This phentermine does give the patient increased energy, which increases her physical activity efforts. My concern is taken her off of it may result in increased weight gain Patient has been going to the pole twice per week for  ROS:  ROS   Pertinent History Reviewed:   Reviewed: Significant for no further contact with plastics in [facility]         Past Medical History:  Diagnosis Date  . 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 Abnormal Pap smear   . Abnormal uterine bleeding (AUB) 10/11/2014  . Anxiety   . Asthma   . Depression   . Dysmenorrhea 10/11/2014  . HSV-2 (herpes simplex virus 2) infection   . Hx of migraines 02/09/2014   Has ?aura, will rx POP  . Ilioinguinal neuralgia    left  . Migraine   . Other and unspecified ovarian cyst 02/06/2014  . Ovarian cyst 10/18/2014  . Panic attack   . Thickened endometrium 10/18/2014  . Unspecified symptom associated with female genital organs 01/27/2014  . Vaginal Pap smear, abnormal   . Vulvar abscess 03/09/2014   I&D 3/9 in ER at [facility] was rx'd bactrim ds x 10 days                              Surgical Hx:    Past Surgical History:  Procedure Laterality Date  . ABDOMINAL HYSTERECTOMY    . APPENDECTOMY    . CESAREAN SECTION    .  CHOLECYSTECTOMY    . DILATION AND CURETTAGE OF UTERUS    . LAPAROSCOPIC SUPRACERVICAL HYSTERECTOMY N/A 01/02/2015   Procedure: ATTEMPTED LAPAROSCOPIC SUPRACERVICAL HYSTERECTOMY CONVERTED TO OPEN AT 4098;  Surgeon: Tilda Burrow, MD;  Location: [facility];  Service: Gynecology;  Laterality: N/A;  . OVARIAN CYST REMOVAL    . SCAR REVISION N/A 06/26/2015   Procedure: ABDOMINAL SCAR REVISION;  Surgeon: Tilda Burrow, MD;  Location: [facility];  Service: Gynecology;  Laterality: N/A;  . SUPRACERVICAL ABDOMINAL HYSTERECTOMY N/A 01/02/2015   Procedure: HYSTERECTOMY SUPRACERVICAL ABDOMINAL;  Surgeon: Tilda Burrow, MD;  Location: [facility];  Service: Gynecology;  Laterality: N/A;   Medications: Reviewed & Updated - see associated section                       Current Outpatient Prescriptions:  .  acetaminophen (TYLENOL) 500 MG tablet, Take 500 mg by mouth every 6 (six) hours as needed for mild pain or moderate pain., Disp: , Rfl:  .  ALPRAZolam (XANAX) 0.5 MG tablet, Take 0.5 mg by mouth every 8 (eight) hours as needed for anxiety., Disp: ,  Rfl:  .  OxyCODONE HCl 7.5 MG TABA, Take 1 tablet by mouth every 6 (six) hours as needed., Disp: 120 tablet, Rfl: 0 .  phentermine 37.5 MG capsule, Take 1 capsule (37.5 mg total) by mouth every morning., Disp: 30 capsule, Rfl: 1   Social History: Reviewed -  reports that she has been smoking E-cigarettes.  She has been smoking about 0.00 packs per day for the past 12.00 years. She has never used smokeless tobacco.  Objective Findings:  Vitals: Blood pressure 124/78, pulse 75, weight 276 lb (125.2 kg).  Physical Examination: Abdomen - soft, nontender, nondistended, no masses or organomegaly No visible abnormality, LLq pain   Assessment & Plan:   A:  1. Ilioinguinal nerve neuralgia unchanged 2. Obesity unchanged  P:  1. Renew phentermine 2 more months with increased commitment to an actual physical exercise in the water at her aunt s pool,, 3 times weekly 30  minutes of vigorous exercise 2. Renew current oxycodone dosing

## 2017-08-03 ENCOUNTER — Ambulatory Visit (INDEPENDENT_AMBULATORY_CARE_PROVIDER_SITE_OTHER): Payer: Medicaid Other | Admitting: Obstetrics and Gynecology

## 2017-08-03 ENCOUNTER — Encounter: Payer: Self-pay | Admitting: Obstetrics and Gynecology

## 2017-08-03 VITALS — BP 130/68 | HR 63 | Ht 66.0 in | Wt 273.8 lb

## 2017-08-03 DIAGNOSIS — F320 Major depressive disorder, single episode, mild: Secondary | ICD-10-CM | POA: Diagnosis not present

## 2017-08-03 MED ORDER — OXYCODONE HCL 7.5 MG PO TABS: 1.0000 | ORAL_TABLET | Freq: Four times a day (QID) | ORAL | 0 refills | Status: DC | PRN

## 2017-08-03 MED ORDER — AMITRIPTYLINE HCL 25 MG PO TABS: 25.0000 mg | ORAL_TABLET | Freq: Every day | ORAL | 1 refills | Status: DC

## 2017-08-03 MED ORDER — PHENTERMINE HCL 37.5 MG PO CAPS: 37.5000 mg | ORAL_CAPSULE | ORAL | 1 refills | Status: DC

## 2017-08-03 NOTE — Progress Notes (Signed)
[facility] Clinic Visit  08/03/2017            Patient name: Barbara Jennings MRN [id number]  Date of birth: 03/02/1981  CC & HPI:  Barbara Jennings is a 36 y.o. female presenting today for f/u for review of phentermine for weight loss. Pt was told to take for 2 more months and then reevaluate today. She reports that she has not lost weight, but says it continues to make her more active. Her current weight is 273 lbs. She attempts to eat salads for lunch, and she eats two eggs, bacon, and biscuits for breakfast. She has been utilizing her aunt's pool. She currently takes oxycodone 7.5 mg for ongoing ilioinguinal neuralgia s/p hysterectomy. Pt was last seen in office on 06/01/17 for f/u of her ongoing ilioinguinal neuralgia and weight gain issue.   ROS:  ROS -fever +weight gain + ilioinguinal neuralgia  Pertinent History Reviewed:   Reviewed: Significant for abnormal pap, ongoing ilioinguinal neuralgia, hysterectomy, AUB,  Medical         Past Medical History:  Diagnosis Date  . 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 Abnormal Pap smear   . Abnormal uterine bleeding (AUB) 10/11/2014  . Anxiety   . Asthma   . Depression   . Dysmenorrhea 10/11/2014  . HSV-2 (herpes simplex virus 2) infection   . Hx of migraines 02/09/2014   Has ?aura, will rx POP  . Ilioinguinal neuralgia    left  . Migraine   . Other and unspecified ovarian cyst 02/06/2014  . Ovarian cyst 10/18/2014  . Panic attack   . Thickened endometrium 10/18/2014  . Unspecified symptom associated with female genital organs 01/27/2014  . Vaginal Pap smear, abnormal   . Vulvar abscess 03/09/2014   I&D 3/9 in ER at [facility] was rx'd bactrim ds x 10 days                              Surgical Hx:    Past Surgical History:  Procedure Laterality Date  . ABDOMINAL HYSTERECTOMY    . APPENDECTOMY    . CESAREAN SECTION    .  CHOLECYSTECTOMY    . DILATION AND CURETTAGE OF UTERUS    . LAPAROSCOPIC SUPRACERVICAL HYSTERECTOMY N/A 01/02/2015   Procedure: ATTEMPTED LAPAROSCOPIC SUPRACERVICAL HYSTERECTOMY CONVERTED TO OPEN AT 4098;  Surgeon: Tilda Burrow, MD;  Location: [facility];  Service: Gynecology;  Laterality: N/A;  . OVARIAN CYST REMOVAL    . SCAR REVISION N/A 06/26/2015   Procedure: ABDOMINAL SCAR REVISION;  Surgeon: Tilda Burrow, MD;  Location: [facility];  Service: Gynecology;  Laterality: N/A;  . SUPRACERVICAL ABDOMINAL HYSTERECTOMY N/A 01/02/2015   Procedure: HYSTERECTOMY SUPRACERVICAL ABDOMINAL;  Surgeon: Tilda Burrow, MD;  Location: [facility];  Service: Gynecology;  Laterality: N/A;   Medications: Reviewed & Updated - see associated section                       Current Outpatient Prescriptions:  .  ALPRAZolam (XANAX) 0.5 MG tablet, Take 0.5 mg by mouth every 8 (eight) hours as needed for anxiety., Disp: , Rfl:  .  OxyCODONE HCl 7.5 MG TABA, Take 1 tablet by mouth every 6 (six) hours as needed., Disp: 120 tablet, Rfl: 0   Social History: Reviewed -  reports that  she has been smoking E-cigarettes.  She has been smoking about 0.00 packs per day for the past 12.00 years. She has never used smokeless tobacco.  Objective Findings:  Vitals: Blood pressure 130/68, pulse 63, height 5\' 6"  (1.676 m), weight 273 lb 12.8 oz (124.2 kg).  Physical Examination: General appearance - alert, well appearing, and in no distress Mental status - alert, oriented to person, place, and time Pelvic - not indicated    Assessment & Plan:   A:  1. Ilioinguinal nerve neuralgia 2. Obesity 3. depression 4 low pt motivation  P:  1. Rx amitriptyline 25 mg, increase to 50mg  at 2 weeks, take at night before bed, continue phentermine for 30 days 2. F/u in 1 month 3   By signing my name below, I, Izna Ahmed, attest that this documentation has been prepared under the direction and in the presence of Tilda BurrowFerguson, Aritza Brunet V,  MD. Electronically Signed: Redge GainerIzna Ahmed, Medical Scribe. 08/03/17. 10:16 AM.  I personally performed the services described in this documentation, which was SCRIBED in my presence. The recorded information has been reviewed and considered accurate. It has been edited as necessary during review. Tilda BurrowFERGUSON,Anjolaoluwa Siguenza V, MD

## 2017-09-02 ENCOUNTER — Encounter: Payer: Self-pay | Admitting: Obstetrics and Gynecology

## 2017-09-02 ENCOUNTER — Ambulatory Visit (INDEPENDENT_AMBULATORY_CARE_PROVIDER_SITE_OTHER): Payer: Medicaid Other | Admitting: Obstetrics and Gynecology

## 2017-09-02 VITALS — BP 120/70 | HR 78 | Wt 273.2 lb

## 2017-09-02 DIAGNOSIS — G5782 Other specified mononeuropathies of left lower limb: Secondary | ICD-10-CM

## 2017-09-02 DIAGNOSIS — G5792 Unspecified mononeuropathy of left lower limb: Secondary | ICD-10-CM | POA: Diagnosis not present

## 2017-09-02 MED ORDER — OXYCODONE HCL 7.5 MG PO TABS: 1.0000 | ORAL_TABLET | Freq: Four times a day (QID) | ORAL | 0 refills | Status: DC | PRN

## 2017-09-02 NOTE — Progress Notes (Signed)
[facility] Visit  @DATE @            Patient name: Barbara Jennings MRN [id number]  Date of birth: 08/16/1981  CC & HPI:  Barbara Jennings is a 36 y.o. female presenting today for followup chronic pain due to ileoinguinal neuralgia. After hysterectomy. Pt uses TENS unit x 30 imins / episode on daily baisis. Pt has applied for [employer] discount program.   ROS:  ROS   Pertinent History Reviewed:   Reviewed: Significant for depression due to "my life being ruined by this" discomfort.  Medical         Past Medical History:  Diagnosis Date  . 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 Abnormal Pap smear   . Abnormal uterine bleeding (AUB) 10/11/2014  . Anxiety   . Asthma   . Depression   . Dysmenorrhea 10/11/2014  . HSV-2 (herpes simplex virus 2) infection   . Hx of migraines 02/09/2014   Has ?aura, will rx POP  . Ilioinguinal neuralgia    left  . Migraine   . Other and unspecified ovarian cyst 02/06/2014  . Ovarian cyst 10/18/2014  . Panic attack   . Thickened endometrium 10/18/2014  . Unspecified symptom associated with female genital organs 01/27/2014  . Vaginal Pap smear, abnormal   . Vulvar abscess 03/09/2014   I&D 3/9 in ER at [facility] was rx'd bactrim ds x 10 days                              Surgical Hx:    Past Surgical History:  Procedure Laterality Date  . ABDOMINAL HYSTERECTOMY    . APPENDECTOMY    . CESAREAN SECTION    . CHOLECYSTECTOMY    . DILATION AND CURETTAGE OF UTERUS    . LAPAROSCOPIC SUPRACERVICAL HYSTERECTOMY N/A 01/02/2015   Procedure: ATTEMPTED LAPAROSCOPIC SUPRACERVICAL HYSTERECTOMY CONVERTED TO OPEN AT [id number];  Surgeon: Tilda BurrowJohn Habiba Treloar V, MD;  Location: [facility];  Service: Gynecology;  Laterality: N/A;  . OVARIAN CYST REMOVAL    . SCAR REVISION N/A 06/26/2015   Procedure: ABDOMINAL SCAR REVISION;  Surgeon: Tilda BurrowJohn Kathy Wares V, MD;  Location: [facility];  Service:  Gynecology;  Laterality: N/A;  . SUPRACERVICAL ABDOMINAL HYSTERECTOMY N/A 01/02/2015   Procedure: HYSTERECTOMY SUPRACERVICAL ABDOMINAL;  Surgeon: Tilda BurrowJohn Takeya Marquis V, MD;  Location: [facility];  Service: Gynecology;  Laterality: N/A;   Medications: Reviewed & Updated - see associated section                       Current Outpatient Prescriptions:  .  ALPRAZolam (XANAX) 0.5 MG tablet, Take 0.5 mg by mouth every 8 (eight) hours as needed for anxiety., Disp: , Rfl:  .  amitriptyline (ELAVIL) 25 MG tablet, Take 1 tablet (25 mg total) by mouth at bedtime. X 2 weeks then 2 tablets hs, Disp: 60 tablet, Rfl: 1 .  OxyCODONE HCl 7.5 MG TABA, Take 1 tablet by mouth every 6 (six) hours as needed., Disp: 120 tablet, Rfl: 0 .  phentermine 37.5 MG capsule, Take 1 capsule (37.5 mg total) by mouth every morning., Disp: 30 capsule, Rfl: 1   Social History: Reviewed -  reports that she has been smoking E-cigarettes.  She has been smoking about 0.00 packs per day for the past 12.00 years. She has never  used smokeless tobacco.  Objective Findings:  Vitals: Blood pressure 120/70, pulse 78, weight 273 lb 3.2 oz (123.9 kg).  Physical Examination: no change     Assessment & Plan:   A:  1. Called [employer] for the pt,, pt should hear this week re: discount. 2. Continue TENS use. 3. Refil hydrocodone.  P:  Pt interested in proceeding toward laparoscopy, with exploration of wound (again)

## 2017-09-16 DIAGNOSIS — Z029 Encounter for administrative examinations, unspecified: Secondary | ICD-10-CM

## 2017-10-02 ENCOUNTER — Encounter: Payer: Self-pay | Admitting: Obstetrics and Gynecology

## 2017-10-02 ENCOUNTER — Ambulatory Visit (INDEPENDENT_AMBULATORY_CARE_PROVIDER_SITE_OTHER): Payer: Medicaid Other | Admitting: Obstetrics and Gynecology

## 2017-10-02 VITALS — BP 122/70 | HR 78 | Wt 274.8 lb

## 2017-10-02 DIAGNOSIS — G5792 Unspecified mononeuropathy of left lower limb: Secondary | ICD-10-CM | POA: Diagnosis not present

## 2017-10-02 MED ORDER — OXYCODONE HCL 7.5 MG PO TABS: 1.0000 | ORAL_TABLET | Freq: Four times a day (QID) | ORAL | 0 refills | Status: DC | PRN

## 2017-10-02 NOTE — Progress Notes (Signed)
[facility] Visit  10/02/2017            Patient name: Barbara Jennings MRN [id number]  Date of birth: 06/23/1981  CC & HPI:  Barbara Jennings is a 36 y.o. female presenting today for a prescription refill for her chronic pain due to ilioinguinal nerve neuralgia after hysterectomy.Patient is asked to let me know if she is willing to consider proceeding with exploratory laparoscopy and exploratory surgery in the area of the suspected ilioinguinal neuralgia to see if we can free up anything between the internal and external oblique muscles. 11 over she wants to proceed in that direction  ROS:  ROS  +chronic pain due to ileoinguinal neuralgia  -fever All systems are negative except as noted in the HPI and PMH.    Pertinent History Reviewed:   Reviewed: Significant for chronic pain due to ileoinguinal neuralgia  Medical         Past Medical History:  Diagnosis Date  . 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 Abnormal Pap smear   . Abnormal uterine bleeding (AUB) 10/11/2014  . Anxiety   . Asthma   . Depression   . Dysmenorrhea 10/11/2014  . HSV-2 (herpes simplex virus 2) infection   . Hx of migraines 02/09/2014   Has ?aura, will rx POP  . Ilioinguinal neuralgia    left  . Migraine   . Other and unspecified ovarian cyst 02/06/2014  . Ovarian cyst 10/18/2014  . Panic attack   . Thickened endometrium 10/18/2014  . Unspecified symptom associated with female genital organs 01/27/2014  . Vaginal Pap smear, abnormal   . Vulvar abscess 03/09/2014   I&D 3/9 in ER at [facility] was rx'd bactrim ds x 10 days                              Surgical Hx:    Past Surgical History:  Procedure Laterality Date  . ABDOMINAL HYSTERECTOMY    . APPENDECTOMY    . CESAREAN SECTION    . CHOLECYSTECTOMY    . DILATION AND CURETTAGE OF UTERUS    . LAPAROSCOPIC SUPRACERVICAL HYSTERECTOMY N/A 01/02/2015   Procedure: ATTEMPTED LAPAROSCOPIC SUPRACERVICAL HYSTERECTOMY CONVERTED TO OPEN AT 4098;  Surgeon: Tilda Burrow, MD;  Location: [facility];  Service: Gynecology;  Laterality: N/A;  . OVARIAN CYST REMOVAL    . SCAR REVISION N/A 06/26/2015   Procedure: ABDOMINAL SCAR REVISION;  Surgeon: Tilda Burrow, MD;  Location: [facility];  Service: Gynecology;  Laterality: N/A;  . SUPRACERVICAL ABDOMINAL HYSTERECTOMY N/A 01/02/2015   Procedure: HYSTERECTOMY SUPRACERVICAL ABDOMINAL;  Surgeon: Tilda Burrow, MD;  Location: [facility];  Service: Gynecology;  Laterality: N/A;   Medications: Reviewed & Updated - see associated section                       Current Outpatient Prescriptions:  .  ALPRAZolam (XANAX) 0.5 MG tablet, Take 0.5 mg by mouth every 8 (eight) hours as needed for anxiety., Disp: , Rfl:  .  amitriptyline (ELAVIL) 25 MG tablet, Take 1 tablet (25 mg total) by mouth at bedtime. X 2 weeks then 2 tablets hs, Disp: 60 tablet, Rfl: 1 .  OxyCODONE HCl 7.5 MG TABA, Take 1 tablet by mouth every 6 (six) hours as needed., Disp: 120 tablet, Rfl: 0 .  phentermine 37.5 MG capsule, Take 1 capsule (37.5 mg total) by mouth every morning., Disp: 30 capsule, Rfl: 1   Social History: Reviewed -  reports that she has been smoking E-cigarettes.  She has been smoking about 0.00 packs per day for the past 12.00 years. She has never used smokeless tobacco.  Objective Findings:  Vitals: Blood pressure 122/70, pulse 78, weight 274 lb 12.8 oz (124.6 kg).  Physical Examination: General appearance - alert, well appearing, and in no distress Mental status - alert, oriented to person, place, and time Pelvic - not indicated   Assessment & Plan:   A:  1. Refill Hydrocodone 7.5mg  Rx  P:  1. F/u PRN or 1 month    By signing my name below, I, Izna Ahmed, attest that this documentation has been prepared under the direction and in the presence of Tilda Burrow, MD. Electronically Signed: Redge Gainer, Medical Scribe.  10/02/17. 9:01 AM.  I personally performed the services described in this documentation, which was SCRIBED in my presence. The recorded information has been reviewed and considered accurate. It has been edited as necessary during review. Tilda Burrow, MD

## 2017-11-02 ENCOUNTER — Ambulatory Visit: Payer: Medicaid Other | Admitting: Obstetrics and Gynecology

## 2017-11-02 ENCOUNTER — Encounter: Payer: Self-pay | Admitting: Obstetrics and Gynecology

## 2017-11-02 VITALS — BP 124/82 | HR 64 | Ht 66.0 in | Wt 264.0 lb

## 2017-11-02 DIAGNOSIS — G5792 Unspecified mononeuropathy of left lower limb: Secondary | ICD-10-CM

## 2017-11-02 MED ORDER — OXYCODONE HCL 7.5 MG PO TABS: 1.0000 | ORAL_TABLET | Freq: Four times a day (QID) | ORAL | 0 refills | Status: DC | PRN

## 2017-11-02 MED ORDER — PHENTERMINE HCL 37.5 MG PO CAPS: 37.5000 mg | ORAL_CAPSULE | ORAL | 1 refills | Status: DC

## 2017-11-02 NOTE — Progress Notes (Signed)
Patient ID: Barbara Jennings, female   DOB: 10/10/1981, 36 y.o.   MRN: [id number]   [facility]  @DATE @            Patient name: Barbara Jennings MRN [id number]  Date of birth: 06/12/1981  CC & HPI:  Barbara Jennings is a 36 y.o. female presenting today for medication refill for her chronic pain due to ilioinguinal neuralgia after hysterectomy. She reports her life is miserably stable. Her abdominal pain starts in the middle and radiates outward. She notes it is more severe on her left side. Pt does not eat a lot, but doesn't exercise. Otherwise, she denies fever, chills or any other symptoms or complaints at this time.   ROS:  ROS (+) chronic pain due to ilioinguinal neuralgia -fever -chills All systems are negative except as noted in the HPI and PMH.   Pertinent History Reviewed:   Reviewed: Significant for Ilioinguinal neuralgia Medical         Past Medical History:  Diagnosis Date  . 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 Abnormal Pap smear   . Abnormal uterine bleeding (AUB) 10/11/2014  . Anxiety   . Asthma   . Depression   . Dysmenorrhea 10/11/2014  . HSV-2 (herpes simplex virus 2) infection   . Hx of migraines 02/09/2014   Has ?aura, will rx POP  . Ilioinguinal neuralgia    left  . Migraine   . Other and unspecified ovarian cyst 02/06/2014  . Ovarian cyst 10/18/2014  . Panic attack   . Thickened endometrium 10/18/2014  . Unspecified symptom associated with female genital organs 01/27/2014  . Vaginal Pap smear, abnormal   . Vulvar abscess 03/09/2014   [facility] rx'd bactrim ds x 10 days                              Surgical Hx:    Past Surgical History:  Procedure Laterality Date  . ABDOMINAL HYSTERECTOMY    . APPENDECTOMY    . CESAREAN SECTION    . CHOLECYSTECTOMY    . DILATION AND CURETTAGE OF UTERUS    . OVARIAN CYST REMOVAL      Medications: Reviewed & Updated - see associated section                       Current Outpatient Medications:  .  ALPRAZolam (XANAX) 0.5 MG tablet, Take 0.5 mg by mouth every 8 (eight) hours as needed for anxiety., Disp: , Rfl:  .  amitriptyline (ELAVIL) 25 MG tablet, Take 1 tablet (25 mg total) by mouth at bedtime. X 2 weeks then 2 tablets hs, Disp: 60 tablet, Rfl: 1 .  OxyCODONE HCl 7.5 MG TABA, Take 1 tablet by mouth every 6 (six) hours as needed., Disp: 120 tablet, Rfl: 0 .  phentermine 37.5 MG capsule, Take 1 capsule (37.5 mg total) by mouth every morning., Disp: 30 capsule, Rfl: 1   Social History: Reviewed -  reports that she has been smoking e-cigarettes.  She has been smoking about 0.00 packs per day for the past 12.00 years. she has never used smokeless tobacco.  Objective Findings:  Vitals: Blood pressure 124/82, pulse 64, height 5\' 6"  (1.676 m), weight 264 lb (119.7 kg).  Physical Examination: General appearance -  alert, well appearing, and in no distress, oriented to person, place, and time and overweight Mental status - alert, oriented to person, place, and time, normal mood, behavior, speech, dress, motor activity, and thought processes, affect appropriate to mood   Assessment & Plan:   A:  1. Ilioinguinal neuralgia 2. Depression, stable 3. Obesity, stable  P:  1. Med Refill, phentermine and oxycodone 2. F/u 1 month   By signing my name below, I, [name], attest that this documentation has been prepared under the direction and in the presence of Tilda Burrow, MD. Electronically Signed: [name], Medical Scribe. 11/02/17. 9:41 AM.  I personally performed the services described in this documentation, which was SCRIBED in my presence. The recorded information has been reviewed and considered accurate. It has been edited as necessary during review. Tilda Burrow, MD

## 2017-12-02 ENCOUNTER — Encounter: Payer: Self-pay | Admitting: Obstetrics and Gynecology

## 2017-12-02 ENCOUNTER — Ambulatory Visit: Payer: Medicaid Other | Admitting: Obstetrics and Gynecology

## 2017-12-02 VITALS — BP 140/70 | HR 89 | Ht 66.0 in | Wt 264.4 lb

## 2017-12-02 DIAGNOSIS — L7682 Other postprocedural complications of skin and subcutaneous tissue: Secondary | ICD-10-CM | POA: Diagnosis not present

## 2017-12-02 DIAGNOSIS — G5792 Unspecified mononeuropathy of left lower limb: Secondary | ICD-10-CM | POA: Diagnosis not present

## 2017-12-02 MED ORDER — OXYCODONE HCL 7.5 MG PO TABS: 1.0000 | ORAL_TABLET | Freq: Four times a day (QID) | ORAL | 0 refills | Status: DC | PRN

## 2017-12-02 NOTE — Progress Notes (Signed)
Patient ID: Barbara Jennings, female   DOB: 07/17/1981, 36 y.o.   MRN: [id number]   [facility]  @DATE @            Patient name: Barbara Jennings MRN [id number]  Date of birth: 12/24/1981  CC & HPI:  Barbara Jennings is a 36 y.o. female presenting today for a medication refill for her chronic pain that started after her hysterectomy because of ilioinguinal neuralgia.   ROS:  ROS +chronic pain from ilioinguinal neuralgia All systems are negative except as noted in the HPI and PMH.   Pertinent History Reviewed:   Reviewed: Significant for ilioinguinal neuralgia Medical         Past Medical History:  Diagnosis Date  . 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 Abnormal Pap smear   . Abnormal uterine bleeding (AUB) 10/11/2014  . Anxiety   . Asthma   . Depression   . Dysmenorrhea 10/11/2014  . HSV-2 (herpes simplex virus 2) infection   . Hx of migraines 02/09/2014   Has ?aura, will rx POP  . Ilioinguinal neuralgia    left  . Migraine   . Other and unspecified ovarian cyst 02/06/2014  . Ovarian cyst 10/18/2014  . Panic attack   . Thickened endometrium 10/18/2014  . Unspecified symptom associated with female genital organs 01/27/2014  . Vaginal Pap smear, abnormal   . Vulvar abscess 03/09/2014   [facility] was rx'd bactrim ds x 10 days                              Surgical Hx:    Past Surgical History:  Procedure Laterality Date  . ABDOMINAL HYSTERECTOMY    . APPENDECTOMY    . CESAREAN SECTION    . CHOLECYSTECTOMY    . DILATION AND CURETTAGE OF UTERUS    . LAPAROSCOPIC SUPRACERVICAL HYSTERECTOMY N/A 01/02/2015   Procedure: ATTEMPTED LAPAROSCOPIC SUPRACERVICAL HYSTERECTOMY CONVERTED TO OPEN AT [id number];  Surgeon: Tilda BurrowJohn Andray Assefa V, MD;  Location: [facility];  Service: Gynecology;  Laterality: N/A;  . OVARIAN CYST REMOVAL    . SCAR REVISION N/A 06/26/2015   Procedure:  ABDOMINAL SCAR REVISION;  Surgeon: Tilda BurrowJohn Prisila Dlouhy V, MD;  Location: [facility];  Service: Gynecology;  Laterality: N/A;  . SUPRACERVICAL ABDOMINAL HYSTERECTOMY N/A 01/02/2015   Procedure: HYSTERECTOMY SUPRACERVICAL ABDOMINAL;  Surgeon: Tilda BurrowJohn Arien Morine V, MD;  Location: [facility];  Service: Gynecology;  Laterality: N/A;   Medications: Reviewed & Updated - see associated section                       Current Outpatient Medications:  .  ALPRAZolam (XANAX) 0.5 MG tablet, Take 0.5 mg by mouth every 8 (eight) hours as needed for anxiety., Disp: , Rfl:  .  amitriptyline (ELAVIL) 25 MG tablet, Take 1 tablet (25 mg total) by mouth at bedtime. X 2 weeks then 2 tablets hs (Patient taking differently: Take 25 mg by mouth at bedtime. ), Disp: 60 tablet, Rfl: 1 .  OxyCODONE HCl 7.5 MG TABA, Take 1 tablet every 6 (six) hours as needed by mouth., Disp: 120 tablet, Rfl: 0 .  phentermine 37.5 MG capsule, Take 1 capsule (37.5 mg total) every morning by mouth., Disp: 30 capsule, Rfl: 1   Social History: Reviewed -  reports  that she has been smoking e-cigarettes.  She has been smoking about 0.00 packs per day for the past 12.00 years. she has never used smokeless tobacco.  Objective Findings:  Vitals: Blood pressure 140/70, pulse 89, height 5\' 6"  (1.676 m), weight 264 lb 6.4 oz (119.9 kg).  Physical Examination: General appearance - alert, well appearing, and in no distress and oriented to person, place, and time Mental status - alert, oriented to person, place, and time, normal mood, behavior, speech, dress, motor activity, and thought processes, affect appropriate to mood   Assessment & Plan:   A:  1. ilioinguinal neuralgia nerve entrapment, stable  P:  1. F/u 1 month 2. Rx oxycodone 7.5 mg every 6 hours as needed pain   By signing my name below, I, Diona BrownerJennifer Gorman, attest that this documentation has been prepared under the direction and in the presence of Tilda BurrowFerguson, Daren Doswell V, MD. Electronically Signed: Diona BrownerJennifer  Gorman, Medical Scribe. 12/02/17. 10:42 AM.  I personally performed the services described in this documentation, which was SCRIBED in my presence. The recorded information has been reviewed and considered accurate. It has been edited as necessary during review. Tilda BurrowJohn V Lamees Gable, MD

## 2017-12-30 ENCOUNTER — Encounter: Payer: Self-pay | Admitting: Obstetrics and Gynecology

## 2017-12-30 ENCOUNTER — Ambulatory Visit: Payer: Medicaid Other | Admitting: Obstetrics and Gynecology

## 2017-12-30 VITALS — BP 120/80 | HR 64 | Ht 66.0 in | Wt 265.0 lb

## 2017-12-30 DIAGNOSIS — G5792 Unspecified mononeuropathy of left lower limb: Secondary | ICD-10-CM

## 2017-12-30 MED ORDER — OXYCODONE HCL 7.5 MG PO TABS: 1.0000 | ORAL_TABLET | Freq: Four times a day (QID) | ORAL | 0 refills | Status: DC | PRN

## 2017-12-30 NOTE — Progress Notes (Signed)
Patient ID: Barbara Jennings, female   DOB: 03/12/1981, 37 y.o.   MRN: [id number]   [facility]  @[facility] @            Patient name: Barbara Jennings MRN [id number]  Date of birth: 08/10/1981  CC & HPI:  Barbara Jennings is a 37 y.o. 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 She states that her pain has not gotten better or worse. She is not currently working. Barbara Jennings is trying to loose weight and adds that she doesn't eat a lot. She has a treadmill and an elliptical. She doesn't use the elliptical as much because it exacerbates her discomfort. She denies fever, chills or any other symptoms or complaints at this time.   ROS:  ROS +chronic pain from ilioinguinal neuralgia -fever -chills All systems are negative except as noted in the HPI and PMH.   Pertinent History Reviewed:   Reviewed: Significant for ilioinguinal neuralgia Medical         Past Medical History:  Diagnosis Date  . 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 Abnormal Pap smear   . Abnormal uterine bleeding (AUB) 10/11/2014  . Anxiety   . Asthma   . Depression   . Dysmenorrhea 10/11/2014  . HSV-2 (herpes simplex virus 2) infection   . Hx of migraines 02/09/2014   Has ?aura, will rx POP  . Ilioinguinal neuralgia    left  . Migraine   . Other and unspecified ovarian cyst 02/06/2014  . Ovarian cyst 10/18/2014  . Panic attack   . Thickened endometrium 10/18/2014  . Unspecified symptom associated with female genital organs 01/27/2014  . Vaginal Pap smear, abnormal   . Vulvar abscess 03/09/2014   [facility]'d bactrim ds x 10 days                              Surgical Hx:    Past Surgical History:  Procedure Laterality Date  . ABDOMINAL HYSTERECTOMY    . APPENDECTOMY    . CESAREAN SECTION    .  CHOLECYSTECTOMY    . DILATION AND CURETTAGE OF UTERUS    . LAPAROSCOPIC SUPRACERVICAL HYSTERECTOMY N/A 01/02/2015   Procedure: ATTEMPTED LAPAROSCOPIC SUPRACERVICAL HYSTERECTOMY CONVERTED TO OPEN AT [id number];  Surgeon: Tilda BurrowJohn Libertie Hausler V, MD;  Location: [facility];  Service: Gynecology;  Laterality: N/A;  . OVARIAN CYST REMOVAL    . SCAR REVISION N/A 06/26/2015   Procedure: ABDOMINAL SCAR REVISION;  Surgeon: Tilda BurrowJohn Sergio Zawislak V, MD;  Location: [facility];  Service: Gynecology;  Laterality: N/A;  . SUPRACERVICAL ABDOMINAL HYSTERECTOMY N/A 01/02/2015   Procedure: HYSTERECTOMY SUPRACERVICAL ABDOMINAL;  Surgeon: Tilda BurrowJohn Tajanay Hurley V, MD;  Location: [facility];  Service: Gynecology;  Laterality: N/A;   Medications: Reviewed & Updated - see associated section                       Current Outpatient Medications:  .  ALPRAZolam (XANAX) 0.5 MG tablet, Take 0.5 mg by mouth every 8 (eight) hours as needed for anxiety., Disp: , Rfl:  .  amitriptyline (ELAVIL) 25 MG tablet, Take 1 tablet (25 mg total) by mouth at bedtime. X 2 weeks then 2 tablets hs (Patient taking differently:  Take 25 mg by mouth at bedtime. ), Disp: 60 tablet, Rfl: 1 .  OxyCODONE HCl 7.5 MG TABA, Take 1 tablet by mouth every 6 (six) hours as needed., Disp: 120 tablet, Rfl: 0 .  phentermine 37.5 MG capsule, Take 1 capsule (37.5 mg total) every morning by mouth., Disp: 30 capsule, Rfl: 1   Social History: Reviewed -  reports that she has been smoking e-cigarettes.  She has been smoking about 0.00 packs per day for the past 12.00 years. she has never used smokeless tobacco.  Objective Findings:  Vitals: Blood pressure 120/80, pulse 64, height 5\' 6"  (1.676 m), weight 265 lb (120.2 kg).  PHYSICAL EXAMINATION General appearance - alert, well appearing, and in no distress, oriented to person, place, and time and overweight Mental status - alert, oriented to person, place, and time, normal mood, behavior, speech, dress, motor activity, and thought processes, affect  appropriate to mood PELVIC Deferred  Discussion: Discussed with pt weight loss methods including food measurement and calorie counting using apps such as MyNet[employer]iary or My Fitness Pal. Recommended the use of measuring cups and spoons to monitor serving sizes. Encouraged adequate daily water intake, especially prior to meals to eliminate overeating. Additionally encouraged pt to become more active by taking daily walks of at least 30 minute duration, join a local gym such as the [facility] or attend water aerobics classes. Also advised pt to use pedometer on smartphone or utilize a smartband such as FitBit to keep track of daily activity.   At end of discussion, pt had opportunity to ask questions and has no further questions at this time.   Specific discussion of lifestyle changes, behavioral modifications and healthy eating habits as noted above. Greater than 50% was spent in counseling and coordination of care with the patient.  Total time greater than: 15 minutes.    Assessment & Plan:   A:  1. Ilioinguinal Neuralgia nerve entrapment, stable  P:  1. F/u 1 month 2. Rx oxycodone 7.5 mg every 6 hours as needed for pain refil x 120 tabs. Lengthy review of need for  By signing my name below, I, [name], attest that this documentation has been prepared under the direction and in the presence of Tilda Burrow, MD. Electronically Signed: [name], Medical Scribe. 12/30/17. 1:32 PM.  I personally performed the services described in this documentation, which was SCRIBED in my presence. The recorded information has been reviewed and considered accurate. It has been edited as necessary during review. Tilda Burrow, MD

## 2018-01-29 ENCOUNTER — Other Ambulatory Visit: Payer: Self-pay

## 2018-01-29 ENCOUNTER — Ambulatory Visit: Payer: Medicaid Other | Admitting: Obstetrics and Gynecology

## 2018-01-29 ENCOUNTER — Encounter: Payer: Self-pay | Admitting: Obstetrics and Gynecology

## 2018-01-29 VITALS — BP 122/66 | HR 75 | Ht 66.0 in | Wt 275.0 lb

## 2018-01-29 DIAGNOSIS — G5781 Other specified mononeuropathies of right lower limb: Secondary | ICD-10-CM

## 2018-01-29 DIAGNOSIS — G5792 Unspecified mononeuropathy of left lower limb: Secondary | ICD-10-CM

## 2018-01-29 DIAGNOSIS — G8929 Other chronic pain: Secondary | ICD-10-CM

## 2018-01-29 MED ORDER — OXYCODONE HCL 7.5 MG PO TABS: 1.0000 | ORAL_TABLET | Freq: Four times a day (QID) | ORAL | 0 refills | Status: DC | PRN

## 2018-01-29 NOTE — Progress Notes (Signed)
Patient ID: ERMELINDA Jennings, female   DOB: 09-17-1981, 37 y.o.   MRN: [id number]   [facility] Visit  @DATE @            Patient name: Barbara Jennings MRN [id number]  Date of birth: 05/24/1981  CC & HPI:  Barbara Jennings is a 37 y.o. female presenting today for a medication refill for pain management of her chronic pain from her ilioinguinal neuralgia. Her pain ha been well managed by her prescribed medication. She denies fever, chills or any other symptoms or complaints at this time.   ROS:  ROS +chronic pain -fever -chills All systems are negative except as noted in the HPI and PMH.   Pertinent History Reviewed:   Reviewed: Significant for ilioinguinal neuralgia Medical         Past Medical History:  Diagnosis Date  . 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 Abnormal Pap smear   . Abnormal uterine bleeding (AUB) 10/11/2014  . Anxiety   . Asthma   . Depression   . Dysmenorrhea 10/11/2014  . HSV-2 (herpes simplex virus 2) infection   . Hx of migraines 02/09/2014   Has ?aura, will rx POP  . Ilioinguinal neuralgia    left  . Migraine   . Other and unspecified ovarian cyst 02/06/2014  . Ovarian cyst 10/18/2014  . Panic attack   . Thickened endometrium 10/18/2014  . Unspecified symptom associated with female genital organs 01/27/2014  . Vaginal Pap smear, abnormal   . Vulvar abscess 03/09/2014   I&D 3/9 in ER at [facility] was rx'd bactrim ds x 10 days                              Surgical Hx:    Past Surgical History:  Procedure Laterality Date  . ABDOMINAL HYSTERECTOMY    . APPENDECTOMY    . CESAREAN SECTION    . CHOLECYSTECTOMY    . DILATION AND CURETTAGE OF UTERUS    . LAPAROSCOPIC SUPRACERVICAL HYSTERECTOMY N/A 01/02/2015   Procedure: ATTEMPTED LAPAROSCOPIC SUPRACERVICAL HYSTERECTOMY CONVERTED TO OPEN AT 7829;  Surgeon: Tilda Burrow, MD;  Location: [facility];  Service:  Gynecology;  Laterality: N/A;  . OVARIAN CYST REMOVAL    . SCAR REVISION N/A 06/26/2015   Procedure: ABDOMINAL SCAR REVISION;  Surgeon: Tilda Burrow, MD;  Location: [facility];  Service: Gynecology;  Laterality: N/A;  . SUPRACERVICAL ABDOMINAL HYSTERECTOMY N/A 01/02/2015   Procedure: HYSTERECTOMY SUPRACERVICAL ABDOMINAL;  Surgeon: Tilda Burrow, MD;  Location: [facility];  Service: Gynecology;  Laterality: N/A;   Medications: Reviewed & Updated - see associated section                       Current Outpatient Medications:  .  ALPRAZolam (XANAX) 0.5 MG tablet, Take 0.5 mg by mouth every 8 (eight) hours as needed for anxiety., Disp: , Rfl:  .  amitriptyline (ELAVIL) 25 MG tablet, Take 1 tablet (25 mg total) by mouth at bedtime. X 2 weeks then 2 tablets hs (Patient taking differently: Take 25 mg by mouth at bedtime. ), Disp: 60 tablet, Rfl: 1 .  OxyCODONE HCl 7.5 MG TABA, Take 1 tablet by mouth every 6 (six) hours as needed., Disp: 120 tablet, Rfl: 0 .  phentermine 37.5 MG capsule, Take 1 capsule (  37.5 mg total) every morning by mouth., Disp: 30 capsule, Rfl: 1   Social History: Reviewed -  reports that she has been smoking e-cigarettes.  She has been smoking about 0.00 packs per day for the past 12.00 years. she has never used smokeless tobacco.  Objective Findings:  Vitals: Blood pressure 122/66, pulse 75, height 5\' 6"  (1.676 m), weight 275 lb (124.7 kg).  PHYSICAL EXAMINATION General appearance - alert, well appearing, and in no distress, oriented to person, place, and time and overweight Mental status - alert, oriented to person, place, and time, normal mood, behavior, speech, dress, motor activity, and thought processes, affect appropriate to mood  PELVIC DEFERRED   Assessment & Plan:   A:  1. Ilioinguinal Neuralgia nerve entrapment, stable  P:  1. F/u 1 month 2. Rx oxycodone 7.5 mg every 6 hours as needed for pain refill x 120 tabs 3. Weight loss encouraged , as always 4. Pt is not  yet ready to commit to laparoscopy and exploration of scar. Pt made again aware that's an option.   By signing my name below, I, Diona BrownerJennifer Gorman, attest that this documentation has been prepared under the direction and in the presence of Tilda BurrowFerguson, Zamara Cozad V, MD. Electronically Signed: Diona BrownerJennifer Gorman, Medical Scribe. 01/29/18. 9:11 AM.  I personally performed the services described in this documentation, which was SCRIBED in my presence. The recorded information has been reviewed and considered accurate. It has been edited as necessary during review. Tilda BurrowJohn V Vivian Neuwirth, MD

## 2018-02-26 ENCOUNTER — Encounter: Payer: Self-pay | Admitting: Obstetrics and Gynecology

## 2018-02-26 ENCOUNTER — Ambulatory Visit: Payer: Medicaid Other | Admitting: Obstetrics and Gynecology

## 2018-02-26 VITALS — BP 120/70 | HR 91 | Ht 66.0 in | Wt 278.4 lb

## 2018-02-26 DIAGNOSIS — G894 Chronic pain syndrome: Secondary | ICD-10-CM | POA: Diagnosis not present

## 2018-02-26 DIAGNOSIS — G5792 Unspecified mononeuropathy of left lower limb: Secondary | ICD-10-CM | POA: Diagnosis not present

## 2018-02-26 MED ORDER — PHENTERMINE HCL 37.5 MG PO CAPS: 37.5000 mg | ORAL_CAPSULE | ORAL | 1 refills | Status: DC

## 2018-02-26 MED ORDER — OXYCODONE HCL 7.5 MG PO TABS: 1.0000 | ORAL_TABLET | Freq: Four times a day (QID) | ORAL | 0 refills | Status: DC | PRN

## 2018-02-26 NOTE — Progress Notes (Signed)
Patient ID: BELLAH ALIA, female   DOB: 1981/09/27, 37 y.o.   MRN: [id number]   [facility] Clinic Visit  @DATE @            Patient name: Barbara Jennings MRN [id number]  Date of birth: [birth year]  CC & HPI:  Barbara Jennings is a 37 y.o. female presenting today for a medication refill for her chronic pain due to her ilioinguinal neuralgia. Barbara Jennings has been wearing her step tracker everyday and has been monitoring her steps. She notes getting in about 3,000 steps a day. She denies fever, chills or any other complaints at this time.  ROS:  ROS +chronic pain -fever -chills All systems are negative except as noted in the HPI and PMH.   Pertinent History Reviewed:   Reviewed: Significant for ilioinguinal neuralgia Medical         Past Medical History:  Diagnosis Date  . 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 Abnormal Pap smear   . Abnormal uterine bleeding (AUB) 10/11/2014  . Anxiety   . Asthma   . Depression   . Dysmenorrhea 10/11/2014  . HSV-2 (herpes simplex virus 2) infection   . Hx of migraines 02/09/2014   Has ?aura, will rx POP  . Ilioinguinal neuralgia    left  . Migraine   . Other and unspecified ovarian cyst 02/06/2014  . Ovarian cyst 10/18/2014  . Panic attack   . Thickened endometrium 10/18/2014  . Unspecified symptom associated with female genital organs 01/27/2014  . Vaginal Pap smear, abnormal   . Vulvar abscess 03/09/2014   I&D 3/9 in ER at [facility] was rx'd bactrim ds x 10 days                              Surgical Hx:    Past Surgical History:  Procedure Laterality Date  . ABDOMINAL HYSTERECTOMY    . APPENDECTOMY    . CESAREAN SECTION    . CHOLECYSTECTOMY    . DILATION AND CURETTAGE OF UTERUS    . LAPAROSCOPIC SUPRACERVICAL HYSTERECTOMY N/A 01/02/2015   Procedure: ATTEMPTED LAPAROSCOPIC SUPRACERVICAL HYSTERECTOMY CONVERTED TO OPEN AT 7829;  Surgeon: Tilda Burrow, MD;  Location: [facility];  Service: Gynecology;  Laterality: N/A;  . OVARIAN CYST REMOVAL    . SCAR REVISION N/A 06/26/2015   Procedure: ABDOMINAL SCAR REVISION;  Surgeon: Tilda Burrow, MD;  Location: [facility];  Service: Gynecology;  Laterality: N/A;  . SUPRACERVICAL ABDOMINAL HYSTERECTOMY N/A 01/02/2015   Procedure: HYSTERECTOMY SUPRACERVICAL ABDOMINAL;  Surgeon: Tilda Burrow, MD;  Location: [facility];  Service: Gynecology;  Laterality: N/A;   Medications: Reviewed & Updated - see associated section                       Current Outpatient Medications:  .  ALPRAZolam (XANAX) 0.5 MG tablet, Take 0.5 mg by mouth every 8 (eight) hours as needed for anxiety., Disp: , Rfl:  .  oxyCODONE HCl 7.5 MG TABA, Take 1 tablet by mouth every 6 (six) hours as needed., Disp: 120 tablet, Rfl: 0 .  phentermine 37.5 MG capsule, Take 1 capsule (37.5 mg total) by mouth every morning., Disp: 30 capsule, Rfl: 1 .  amitriptyline (ELAVIL) 25 MG tablet, Take 1 tablet (25 mg total) by mouth at bedtime. X 2  weeks then 2 tablets hs (Patient not taking: Reported on 02/26/2018), Disp: 60 tablet, Rfl: 1   Social History: Reviewed -  reports that she has been smoking e-cigarettes.  She has been smoking about 0.00 packs per day for the past 12.00 years. she has never used smokeless tobacco.  Objective Findings:  Vitals: Blood pressure 120/70, pulse 91, height 5\' 6"  (1.676 m), weight 278 lb 6.4 oz (126.3 kg).  PHYSICAL EXAMINATION General appearance - alert, well appearing, and in no distress, oriented to person, place, and time and overweight Mental status - alert, oriented to person, place, and time, normal mood, behavior, speech, dress, motor activity, and thought processes, affect appropriate to mood  PELVIC Deferred    Assessment & Plan:   A:  1. Ilioinguinal Neuralgia nerve entrapment, stable  P: Continue phentermine, continue oxycodone 1. F/u 1 month 2. Rx oxycodone 7.5 mg every 6 hours as needed for  pain refill x 120 tabs 3. Weight loss encouraged , as always   By signing my name below, I, Diona BrownerJennifer Gorman, attest that this documentation has been prepared under the direction and in the presence of Tilda BurrowFerguson, Yeslin Delio V, MD. Electronically Signed: Diona BrownerJennifer Gorman, Medical Scribe. 02/26/18. 9:21 AM.  I personally performed the services described in this documentation, which was SCRIBED in my presence. The recorded information has been reviewed and considered accurate. It has been edited as necessary during review. Tilda BurrowJohn V Azuree Minish, MD

## 2018-03-26 ENCOUNTER — Encounter: Payer: Self-pay | Admitting: Obstetrics and Gynecology

## 2018-03-26 ENCOUNTER — Ambulatory Visit: Payer: Medicaid Other | Admitting: Obstetrics and Gynecology

## 2018-03-26 VITALS — BP 118/60 | HR 70 | Ht 66.0 in | Wt 286.0 lb

## 2018-03-26 DIAGNOSIS — G5792 Unspecified mononeuropathy of left lower limb: Secondary | ICD-10-CM

## 2018-03-26 DIAGNOSIS — L7682 Other postprocedural complications of skin and subcutaneous tissue: Secondary | ICD-10-CM

## 2018-03-26 DIAGNOSIS — Z713 Dietary counseling and surveillance: Secondary | ICD-10-CM

## 2018-03-26 MED ORDER — OXYCODONE HCL 7.5 MG PO TABS: 1.0000 | ORAL_TABLET | Freq: Four times a day (QID) | ORAL | 0 refills | Status: DC | PRN

## 2018-03-26 NOTE — Progress Notes (Signed)
[facility] Clinic Visit  03/26/18            Patient name: Barbara Jennings MRN [id number]  Date of birth: 07/22/1981  CC & HPI:  Barbara Jennings is a 37 y.o. female presenting today for weight loss discussion and medication change. She notes that the phentermine gave her more energy. She denies any life change recently. She reports that she has had struggles with eating properly while on phentermine. She is more active than previously. She denies any other symptoms.   ROS:  ROS  +increased activity All systems are negative except as noted in the HPI and PMH.    Pertinent History Reviewed:   Reviewed: Significant for  Medical         Past Medical History:  Diagnosis Date  . 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 Abnormal Pap smear   . Abnormal uterine bleeding (AUB) 10/11/2014  . Anxiety   . Asthma   . Depression   . Dysmenorrhea 10/11/2014  . HSV-2 (herpes simplex virus 2) infection   . Hx of migraines 02/09/2014   Has ?aura, will rx POP  . Ilioinguinal neuralgia    left  . Migraine   . Other and unspecified ovarian cyst 02/06/2014  . Ovarian cyst 10/18/2014  . Panic attack   . Thickened endometrium 10/18/2014  . Unspecified symptom associated with female genital organs 01/27/2014  . Vaginal Pap smear, abnormal   . Vulvar abscess 03/09/2014   [facility] rx'd bactrim ds x 10 days                              Surgical Hx:    Past Surgical History:  Procedure Laterality Date  . ABDOMINAL HYSTERECTOMY    . APPENDECTOMY    . CESAREAN SECTION    . CHOLECYSTECTOMY    . DILATION AND CURETTAGE OF UTERUS    . LAPAROSCOPIC SUPRACERVICAL HYSTERECTOMY N/A 01/02/2015   Procedure: ATTEMPTED LAPAROSCOPIC SUPRACERVICAL HYSTERECTOMY CONVERTED TO OPEN AT [id number];  Surgeon: Tilda BurrowJohn Anzal Bartnick V, MD;  Location: [facility];  Service: Gynecology;  Laterality: N/A;  . OVARIAN CYST REMOVAL     . SCAR REVISION N/A 06/26/2015   Procedure: ABDOMINAL SCAR REVISION;  Surgeon: Tilda BurrowJohn Burnie Hank V, MD;  Location: [facility];  Service: Gynecology;  Laterality: N/A;  . SUPRACERVICAL ABDOMINAL HYSTERECTOMY N/A 01/02/2015   Procedure: HYSTERECTOMY SUPRACERVICAL ABDOMINAL;  Surgeon: Tilda BurrowJohn Zlaty Alexa V, MD;  Location: [facility];  Service: Gynecology;  Laterality: N/A;   Medications: Reviewed & Updated - see associated section                       Current Outpatient Medications:  .  ALPRAZolam (XANAX) 0.5 MG tablet, Take 0.5 mg by mouth every 8 (eight) hours as needed for anxiety., Disp: , Rfl:  .  oxyCODONE HCl 7.5 MG TABA, Take 1 tablet by mouth every 6 (six) hours as needed., Disp: 120 tablet, Rfl: 0 .  phentermine 37.5 MG capsule, Take 1 capsule (37.5 mg total) by mouth every morning., Disp: 30 capsule, Rfl: 1   Social History: Reviewed -  reports that she has been smoking e-cigarettes.  She has been smoking about 0.00 packs per day for the past 12.00 years. She has never used smokeless tobacco.  Objective Findings:  Vitals: Blood pressure 118/60, pulse 70, height 5\' 6"  (1.676 m), weight 286 lb (129.7 kg).  Discussion only: Discussed with pt weight loss methods including food measurement and calorie counting using apps such as MyNetDiary or My Fitness Pal. Recommended the use of measuring cups and spoons to monitor serving sizes. Encouraged adequate daily water intake, especially prior to meals to eliminate overeating. Additionally encouraged pt to become more active by taking daily walks of at least 30 minute duration, join a local gym such as the [facility] or attend water aerobics classes. Also advised pt to use pedometer on smartphone or utilize a smartband such as FitBit to keep track of daily activity.   At end of discussion, pt had opportunity to ask questions and has no further questions at this time.   Specific discussion of lifestyle changes, behavioral modifications and healthy eating habits as noted  above. Greater than 50% was spent in counseling and coordination of care with the patient.  Total time greater than: 25 minutes.    Assessment & Plan:   A:  1.  Ilioinguinal neuralgia nerve entrapment, stable, chronic  2. Obesity 3. Depression   P: continue oxycodone 1. F/u 1 month 2. Rx oxycodone 7.5 mg every 6 hours as needed for pain refill x 120 tabs 3. Weight loss encouraged as always    By signing my name below, I, Soijett Blue, attest that this documentation has been prepared under the direction and in the presence of Tilda Burrow, MD. Electronically Signed: Soijett Blue, Scribe. 03/26/18. 9:20 AM.  I personally performed the services described in this documentation, which was SCRIBED in my presence. The recorded information has been reviewed and considered accurate. It has been edited as necessary during review. Tilda Burrow, MD

## 2018-04-23 ENCOUNTER — Ambulatory Visit: Payer: Medicaid Other | Admitting: Obstetrics and Gynecology

## 2018-04-23 ENCOUNTER — Encounter: Payer: Self-pay | Admitting: Obstetrics and Gynecology

## 2018-04-23 VITALS — BP 110/70 | HR 86 | Ht 67.2 in | Wt 282.6 lb

## 2018-04-23 DIAGNOSIS — M5432 Sciatica, left side: Secondary | ICD-10-CM | POA: Diagnosis not present

## 2018-04-23 DIAGNOSIS — G5792 Unspecified mononeuropathy of left lower limb: Secondary | ICD-10-CM

## 2018-04-23 MED ORDER — OXYCODONE HCL 7.5 MG PO TABS: 1.0000 | ORAL_TABLET | Freq: Four times a day (QID) | ORAL | 0 refills | Status: DC | PRN

## 2018-04-23 MED ORDER — PHENTERMINE HCL 37.5 MG PO CAPS: 37.5000 mg | ORAL_CAPSULE | ORAL | 1 refills | Status: DC

## 2018-04-23 NOTE — Progress Notes (Signed)
[facility] Clinic Visit  @DATE @            Patient name: Barbara Jennings MRN [id number]  Date of birth: 10-09-1981  CC & HPI:  Barbara Jennings is a 37 y.o. female presenting today for *her chronic pain in the left lower quadrant felt to be ilioinguinal neuralgia status post hysterectomy.Kurdziel seems to be mentally in a better spot.  She states that she has been trying to push through her discomfort and being more active and it seems to be working her enthusiasm and eye contact is definitely better. She has not lost significant weight advised her that if she is ever going to lose weight get on whether or she is a good state of mind with weight watchers calorie counting and increase activity  ROS:  ROS Sciatica in her left leg  Pertinent History Reviewed:   Reviewed: Significant for  Medical         Past Medical History:  Diagnosis Date  . 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 Abnormal Pap smear   . Abnormal uterine bleeding (AUB) 10/11/2014  . Anxiety   . Asthma   . Depression   . Dysmenorrhea 10/11/2014  . HSV-2 (herpes simplex virus 2) infection   . Hx of migraines 02/09/2014   Has ?aura, will rx POP  . Ilioinguinal neuralgia    left  . Migraine   . Other and unspecified ovarian cyst 02/06/2014  . Ovarian cyst 10/18/2014  . Panic attack   . Thickened endometrium 10/18/2014  . Unspecified symptom associated with female genital organs 01/27/2014  . Vaginal Pap smear, abnormal   . Vulvar abscess 03/09/2014   I&D 3/9 in ER at [facility] was rx'd bactrim ds x 10 days                              Surgical Hx:    Past Surgical History:  Procedure Laterality Date  . ABDOMINAL HYSTERECTOMY    . APPENDECTOMY    . CESAREAN SECTION    . CHOLECYSTECTOMY    . DILATION AND CURETTAGE OF UTERUS    . LAPAROSCOPIC SUPRACERVICAL HYSTERECTOMY N/A 01/02/2015   Procedure: ATTEMPTED LAPAROSCOPIC  SUPRACERVICAL HYSTERECTOMY CONVERTED TO OPEN AT 4098;  Surgeon: Tilda Burrow, MD;  Location: [facility];  Service: Gynecology;  Laterality: N/A;  . OVARIAN CYST REMOVAL    . SCAR REVISION N/A 06/26/2015   Procedure: ABDOMINAL SCAR REVISION;  Surgeon: Tilda Burrow, MD;  Location: [facility];  Service: Gynecology;  Laterality: N/A;  . SUPRACERVICAL ABDOMINAL HYSTERECTOMY N/A 01/02/2015   Procedure: HYSTERECTOMY SUPRACERVICAL ABDOMINAL;  Surgeon: Tilda Burrow, MD;  Location: [facility];  Service: Gynecology;  Laterality: N/A;   Medications: Reviewed & Updated - see associated section                       Current Outpatient Medications:  .  ALPRAZolam (XANAX) 0.5 MG tablet, Take 0.5 mg by mouth every 8 (eight) hours as needed for anxiety., Disp: , Rfl:  .  oxyCODONE HCl 7.5 MG TABA, Take 1 tablet by mouth every 6 (six) hours as needed., Disp: 120 tablet, Rfl: 0 .  phentermine 37.5 MG capsule, Take 1 capsule (37.5 mg total) by mouth every morning., Disp: 30 capsule, Rfl: 1   Social History: Reviewed -  reports that she has been smoking e-cigarettes.  She has been smoking about 0.00 packs per day for the past 12.00 years. She has never used smokeless tobacco.  Objective Findings:  Vitals: Blood pressure 110/70, pulse 86, height 5' 7.2" (1.707 m), weight 282 lb 9.6 oz (128.2 kg).  PHYSICAL EXAMINATION General appearance - alert, well appearing, and in no distress, well hydrated and somewhat depressed appearance has usually been the case, right eye contact branch better today Mental status - alert, oriented to person, place, and time, depressed mood Chest - clear to auscultation, no wheezes, rales or rhonchi, symmetric air entry Heart - normal rate and regular rhythm Abdomen - soft, nontender, nondistended, no masses or organomegaly Breasts -  Skin -   PELVIC    Assessment & Plan:   A:  1. Chronic ilioinguinal pain status post hysterectomy  P:  1. Renew oxycodone at its current dosing x120  tablets 2.  Phentermine 37.5 mg daily follow-up 2 months anticipate patient will at least make a little bit of weight loss.  If she wants me to proceed with the reexploration I need to do it this summer

## 2018-05-20 ENCOUNTER — Encounter: Payer: Self-pay | Admitting: Obstetrics and Gynecology

## 2018-05-20 ENCOUNTER — Other Ambulatory Visit: Payer: Self-pay | Admitting: Obstetrics and Gynecology

## 2018-05-21 ENCOUNTER — Telehealth: Payer: Self-pay | Admitting: Obstetrics and Gynecology

## 2018-05-21 NOTE — Telephone Encounter (Signed)
Pt called requesting a refill on her oxycodone. She states that she will run out on Sunday and wants to know if the refill can be sent in today for Sunday. Advised pt that Dr Emelda Fear is not here today and we are unable to do a refill early. Advised that I would send her request to him. Pt verbalized understanding.

## 2018-05-25 ENCOUNTER — Encounter: Payer: Self-pay | Admitting: Obstetrics and Gynecology

## 2018-05-26 ENCOUNTER — Other Ambulatory Visit: Payer: Self-pay | Admitting: Obstetrics and Gynecology

## 2018-05-26 ENCOUNTER — Telehealth: Payer: Self-pay | Admitting: Obstetrics and Gynecology

## 2018-05-26 MED ORDER — OXYCODONE HCL 7.5 MG PO TABS: 1.0000 | ORAL_TABLET | Freq: Four times a day (QID) | ORAL | 0 refills | Status: DC | PRN

## 2018-05-26 NOTE — Telephone Encounter (Signed)
Oxycodone chronic use filled due to chronic pain of ilioinguinal neuralgia.

## 2018-05-26 NOTE — Telephone Encounter (Signed)
Patient called stating that she needs a refill of her medication, patient states that the pharmacy has already sent over the refill request. Pt states that she is completely out of her medication. Please contact pt when done

## 2018-06-23 ENCOUNTER — Encounter: Payer: Self-pay | Admitting: Obstetrics and Gynecology

## 2018-06-23 ENCOUNTER — Ambulatory Visit: Payer: Medicaid Other | Admitting: Obstetrics and Gynecology

## 2018-06-23 VITALS — BP 123/73 | HR 87 | Ht 66.0 in | Wt 286.0 lb

## 2018-06-23 DIAGNOSIS — Z6841 Body Mass Index (BMI) 40.0 and over, adult: Secondary | ICD-10-CM

## 2018-06-23 DIAGNOSIS — G5792 Unspecified mononeuropathy of left lower limb: Secondary | ICD-10-CM | POA: Diagnosis not present

## 2018-06-23 DIAGNOSIS — Z029 Encounter for administrative examinations, unspecified: Secondary | ICD-10-CM | POA: Diagnosis not present

## 2018-06-23 MED ORDER — OXYCODONE HCL 7.5 MG PO TABS: 1.0000 | ORAL_TABLET | Freq: Four times a day (QID) | ORAL | 0 refills | Status: DC | PRN

## 2018-06-23 NOTE — Progress Notes (Signed)
[facility] Clinic Visit  @DATE @            Patient name: Barbara Jennings MRN [id number]  Date of birth: 05-28-1981  CC & HPI:  Barbara Jennings is a 37 y.o. 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 She reports having trouble sleeping and not eating often. She wants to improve but her pain keeps her from progressing. She has some migraines but does not take medication for them. Pt denies fever, chills, and any other symptoms or complaints at this time.  ROS:  ROS + ilioinguinal neuralgia + occasional migraines - fever - chills  All systems are negative except as noted in the HPI and PMH.   Pertinent History Reviewed:   Reviewed: Significant for ilioinguinal neuralgia and a history of migraines. Medical         Past Medical History:  Diagnosis Date  . 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 Abnormal Pap smear   . Abnormal uterine bleeding (AUB) 10/11/2014  . Anxiety   . Asthma   . Depression   . Dysmenorrhea 10/11/2014  . HSV-2 (herpes simplex virus 2) infection   . Hx of migraines 02/09/2014   Has ?aura, will rx POP  . Ilioinguinal neuralgia    left  . Migraine   . Other and unspecified ovarian cyst 02/06/2014  . Ovarian cyst 10/18/2014  . Panic attack   . Thickened endometrium 10/18/2014  . Unspecified symptom associated with female genital organs 01/27/2014  . Vaginal Pap smear, abnormal   . Vulvar abscess 03/09/2014   I&D 3/9 in ER at [facility] was rx'd bactrim ds x 10 days                              Surgical Hx:    Past Surgical History:  Procedure Laterality Date  . ABDOMINAL HYSTERECTOMY    . APPENDECTOMY    . CESAREAN SECTION    . CHOLECYSTECTOMY    . DILATION AND CURETTAGE OF UTERUS    . LAPAROSCOPIC SUPRACERVICAL HYSTERECTOMY N/A 01/02/2015   Procedure: ATTEMPTED LAPAROSCOPIC SUPRACERVICAL  HYSTERECTOMY CONVERTED TO OPEN AT 4098;  Surgeon: Tilda Burrow, MD;  Location: [facility];  Service: Gynecology;  Laterality: N/A;  . OVARIAN CYST REMOVAL    . SCAR REVISION N/A 06/26/2015   Procedure: ABDOMINAL SCAR REVISION;  Surgeon: Tilda Burrow, MD;  Location: [facility];  Service: Gynecology;  Laterality: N/A;  . SUPRACERVICAL ABDOMINAL HYSTERECTOMY N/A 01/02/2015   Procedure: HYSTERECTOMY SUPRACERVICAL ABDOMINAL;  Surgeon: Tilda Burrow, MD;  Location: [facility];  Service: Gynecology;  Laterality: N/A;   Medications: Reviewed & Updated - see associated section                       Current Outpatient Medications:  .  ALPRAZolam (XANAX) 0.5 MG tablet, Take 0.5 mg by mouth every 8 (eight) hours as needed for anxiety., Disp: , Rfl:  .  oxyCODONE HCl 7.5 MG TABA, Take 1 tablet by mouth every 6 (six) hours as needed., Disp: 120 tablet, Rfl: 0 .  phentermine 37.5 MG capsule, Take 1 capsule (37.5 mg total) by mouth every morning., Disp: 30 capsule, Rfl: 1   Social History: Reviewed -  reports that she has been smoking e-cigarettes.  She has been smoking about 0.00 packs per day for the past 12.00 years. She has never used smokeless tobacco.  Objective Findings:  Vitals: Blood pressure 123/73, pulse 87, height 5\' 6"  (1.676 m), weight 286 lb (129.7 kg).  PHYSICAL EXAMINATION General appearance - alert, well appearing, and in no distress, oriented to person, place, and time and overweight Mental status - alert, oriented to person, place, and time, normal mood, behavior, speech, dress, motor activity, and thought processes, affect appropriate to mood  PELVIC DEFERRED    Assessment & Plan:   A:  1.  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 Obesity Body mass index is 46.16 kg/m.  3. Ruptured Disk  P:  1.  Refill Rx Oxycodone  By signing my name below, I, Pietro CassisEmily Tufford, attest that this documentation has been prepared under the direction and in the presence of Tilda BurrowFerguson, Kaede Clendenen V,  MD. Electronically Signed: Pietro CassisEmily Tufford, Medical Scribe. 06/23/18. 10:43 AM.  I personally performed the services described in this documentation, which was SCRIBED in my presence. The recorded information has been reviewed and considered accurate. It has been edited as necessary during review. Tilda BurrowJohn V Angelita Harnack, MD

## 2018-06-30 ENCOUNTER — Emergency Department (HOSPITAL_COMMUNITY)
Admission: EM | Admit: 2018-06-30 | Discharge: 2018-06-30 | Disposition: A | Discharge status: Home / Self Care | Payer: Medicaid Other | Attending: Emergency Medicine | Admitting: Emergency Medicine

## 2018-06-30 ENCOUNTER — Encounter (HOSPITAL_COMMUNITY): Payer: Self-pay | Admitting: Emergency Medicine

## 2018-06-30 ENCOUNTER — Other Ambulatory Visit: Payer: Self-pay

## 2018-06-30 DIAGNOSIS — J45909 Unspecified asthma, uncomplicated: Secondary | ICD-10-CM | POA: Diagnosis not present

## 2018-06-30 DIAGNOSIS — J020 Streptococcal pharyngitis: Secondary | ICD-10-CM | POA: Diagnosis not present

## 2018-06-30 DIAGNOSIS — Z79899 Other long term (current) drug therapy: Secondary | ICD-10-CM | POA: Insufficient documentation

## 2018-06-30 DIAGNOSIS — M7918 Myalgia, other site: Secondary | ICD-10-CM | POA: Diagnosis present

## 2018-06-30 DIAGNOSIS — F17210 Nicotine dependence, cigarettes, uncomplicated: Secondary | ICD-10-CM | POA: Insufficient documentation

## 2018-06-30 LAB — GROUP A STREP BY PCR: Group A Strep by PCR: DETECTED — AB

## 2018-06-30 MED ORDER — DEXAMETHASONE SODIUM PHOSPHATE 10 MG/ML IJ SOLN
10.0000 mg | Freq: Once | INTRAMUSCULAR | Status: AC
  Administered 2018-06-30: 10 mg via INTRAMUSCULAR
  Filled 2018-06-30: qty 1

## 2018-06-30 MED ORDER — ONDANSETRON 4 MG PO TBDP
4.0000 mg | ORAL_TABLET | Freq: Once | ORAL | Status: AC
  Administered 2018-06-30: 4 mg via ORAL
  Filled 2018-06-30: qty 1

## 2018-06-30 MED ORDER — ONDANSETRON 4 MG PO TBDP: 4.0000 mg | ORAL_TABLET | Freq: Three times a day (TID) | ORAL | 0 refills | Status: AC | PRN

## 2018-06-30 MED ORDER — PENICILLIN G BENZATHINE 1200000 UNIT/2ML IM SUSP
1.2000 10*6.[IU] | Freq: Once | INTRAMUSCULAR | Status: AC
  Administered 2018-06-30: 1.2 10*6.[IU] via INTRAMUSCULAR
  Filled 2018-06-30: qty 2

## 2018-06-30 NOTE — ED Triage Notes (Signed)
Pt states body aches began yesterday. Has had chills, sweating, n/v, no diarrhea.

## 2018-06-30 NOTE — Discharge Instructions (Signed)
You were treated for strep throat with penicillin in the emergency department today.  Alternate 600 mg of ibuprofen and [phone] mg of Tylenol every 3 hours as needed for pain and fever. Do not exceed 4000 mg of Tylenol daily.  Take ibuprofen with food to avoid upset stomach issues.  Drink plenty of water and get plenty of rest.  Use warm water salt gargles, warm teas, honey, Chloraseptic spray over-the-counter, and throat lozenges for management of your sore throat pain.  Follow-up with your PCP in 3 to 5 days if symptoms persist.  Return to the emergency department immediately for any concerning signs or symptoms develop such as fever not controlled by medication, persistent vomiting, throat tightness, drooling.

## 2018-06-30 NOTE — ED Notes (Signed)
Pt was able to keep water down, edp notified.

## 2018-06-30 NOTE — ED Provider Notes (Signed)
[facility] EMERGENCY DEPARTMENT Provider Note   CSN: [id number] Arrival date & time: 06/30/18  1008     History   Chief Complaint Chief Complaint  Patient presents with  . Generalized Body Aches    HPI Barbara Jennings is a 37 y.o. female presents today for evaluation of acute onset, per aggressively worsening generalized myalgias, subjective fevers and chills, and sore throat since yesterday.  Notes pain with swallowing but denies drooling or facial swelling.  Denies cough, shortness of breath, or chest pain.  She has had 4 episodes of nonbloody nonbilious emesis this morning and endorses ongoing nausea but denies abdominal pain, urinary symptoms, vaginal itching, bleeding, discharge, diarrhea, or constipation.  Her fianc developed symptoms the day before her.  She has tried her home oxycodone without significant relief of her symptoms.  The history is provided by the patient.    Past Medical History:  Diagnosis Date  . 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 Abnormal Pap smear   . Abnormal uterine bleeding (AUB) 10/11/2014  . Anxiety   . Asthma   . Depression   . Dysmenorrhea 10/11/2014  . HSV-2 (herpes simplex virus 2) infection   . Hx of migraines 02/09/2014   Has ?aura, will rx POP  . Ilioinguinal neuralgia    left  . Migraine   . Other and unspecified ovarian cyst 02/06/2014  . Ovarian cyst 10/18/2014  . Panic attack   . Thickened endometrium 10/18/2014  . Unspecified symptom associated with female genital organs 01/27/2014  . Vaginal Pap smear, abnormal   . Vulvar abscess 03/09/2014   I&D 3/9 in ER at [facility] was rx'd bactrim ds x 10 days    Patient Active Problem List   Diagnosis Date Noted  . Morbid obesity (HCC) 06/01/2017  . 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 Combined abdominal and pelvic pain 10/26/2015   . Incisional pain s/p hysterectomy 06/26/2015  . Knee pain, chronic 09/12/2014  . Acute meniscal tear of knee 07/03/2014  . Dysplasia of cervix, unspecified 02/16/2014  . Hx of migraines 02/09/2014  . 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 Unspecified symptom associated with female genital organs 01/27/2014  . HSV-2 (herpes simplex virus 2) infection 05/26/2013    Past Surgical History:  Procedure Laterality Date  . ABDOMINAL HYSTERECTOMY    . APPENDECTOMY    . CESAREAN SECTION    . CHOLECYSTECTOMY    . DILATION AND CURETTAGE OF UTERUS    . LAPAROSCOPIC SUPRACERVICAL HYSTERECTOMY N/A 01/02/2015   Procedure: ATTEMPTED LAPAROSCOPIC SUPRACERVICAL HYSTERECTOMY CONVERTED TO OPEN AT 0865;  Surgeon: Tilda Burrow, MD;  Location: [facility];  Service: Gynecology;  Laterality: N/A;  . OVARIAN CYST REMOVAL    . SCAR REVISION N/A 06/26/2015   Procedure: ABDOMINAL SCAR REVISION;  Surgeon: Tilda Burrow, MD;  Location: [facility];  Service: Gynecology;  Laterality: N/A;  . SUPRACERVICAL ABDOMINAL HYSTERECTOMY N/A 01/02/2015   Procedure: HYSTERECTOMY SUPRACERVICAL ABDOMINAL;  Surgeon: Tilda Burrow, MD;  Location: [facility];  Service: Gynecology;  Laterality: N/A;     OB History    Gravida  7   Para  2   Term  1   Preterm  1   AB  5   Living  1     SAB  5  TAB      Ectopic      Multiple      Live Births  1            Home Medications    Prior to Admission medications   Medication Sig Start Date End Date Taking? Authorizing Provider  ALPRAZolam Prudy Feeler) 0.5 MG tablet Take 0.5 mg by mouth every 8 (eight) hours as needed for anxiety.    [provider]  ondansetron (ZOFRAN ODT) 4 MG disintegrating tablet Take 1 tablet (4 mg total) by mouth every 8 (eight) hours as needed for up to 3 days for nausea or vomiting. 06/30/18 07/03/18  Michela Pitcher A, PA-C  oxyCODONE HCl 7.5 MG TABA Take 1 tablet by mouth every 6 (six)  hours as needed. 06/23/18 07/23/18  Tilda Burrow, MD  phentermine 37.5 MG capsule Take 1 capsule (37.5 mg total) by mouth every morning. 04/23/18   Tilda Burrow, MD  citalopram (CELEXA) 20 MG tablet Take 20 mg by mouth daily.    03/04/12  [provider]    Family History Family History  Problem Relation Age of Onset  . Ulcers Father   . Cancer Maternal Aunt        breast   . Diabetes Maternal Grandmother   . Diabetes Maternal Grandfather   . Alzheimer's disease Paternal Grandmother   . Diabetes Maternal Aunt   . Cancer Paternal Aunt        cervical    Social History Social History   Tobacco Use  . Smoking status: Current Every Day Smoker    Packs/day: 0.00    Years: 12.00    Pack years: 0.00    Types: E-cigarettes  . Smokeless tobacco: Never Used  Substance Use Topics  . Alcohol use: No    Comment: socially  . Drug use: No     Allergies   Coconut flavor   Review of Systems Review of Systems  Constitutional: Positive for chills and fever.  HENT: Positive for sore throat. Negative for congestion, drooling and trouble swallowing.   Respiratory: Negative for shortness of breath.   Cardiovascular: Negative for chest pain.  Musculoskeletal: Positive for myalgias.     Physical Exam Updated Vital Signs BP 112/71   Pulse 76   Temp 98.5 F (36.9 C) (Oral)   Resp 18   Ht 5\' 6"  (1.676 m)   Wt 127.9 kg (282 lb)   LMP  (LMP Unknown)   SpO2 99%   BMI 45.52 kg/m   Physical Exam  Constitutional: She appears well-developed and well-nourished. No distress.  HENT:  Head: Normocephalic and atraumatic.  Right Ear: Tympanic membrane, external ear and ear canal normal.  Left Ear: Tympanic membrane, external ear and ear canal normal.  Nose: Nose normal. No mucosal edema. Right sinus exhibits no maxillary sinus tenderness and no frontal sinus tenderness. Left sinus exhibits no maxillary sinus tenderness and no frontal sinus tenderness.  Mouth/Throat: Uvula is  midline and mucous membranes are normal. No trismus in the jaw. Posterior oropharyngeal erythema present. Tonsils are 2+ on the right. Tonsils are 1+ on the left. No tonsillar exudate.  No trismus or sublingual or submental abnormalities.  Tolerating secretions without difficulty.  Eyes: Pupils are equal, round, and reactive to light. Conjunctivae and EOM are normal. Right eye exhibits no discharge. Left eye exhibits no discharge.  Neck: Normal range of motion. Neck supple. No JVD present. No tracheal deviation present.  Bilateral anterior cervical lymphadenopathy.  Cardiovascular: Normal  rate.  Pulmonary/Chest: Effort normal.  Abdominal: She exhibits no distension.  Musculoskeletal: She exhibits no edema.  Lymphadenopathy:    She has cervical adenopathy.  Neurological: She is alert.  Skin: No erythema.  Psychiatric: She has a normal mood and affect. Her behavior is normal.  Nursing note and vitals reviewed.    ED Treatments / Results  Labs (all labs ordered are listed, but only abnormal results are displayed) Labs Reviewed  GROUP A STREP BY PCR - Abnormal; Notable for the following components:      Result Value   Group A Strep by PCR DETECTED (*)    All other components within normal limits    EKG None  Radiology No results found.  Procedures Procedures (including critical care time)  Medications Ordered in ED Medications  ondansetron (ZOFRAN-ODT) disintegrating tablet 4 mg (4 mg Oral Given 06/30/18 1204)  penicillin g benzathine (BICILLIN LA) 1200000 UNIT/2ML injection 1.2 Million Units (1.2 Million Units Intramuscular Given 06/30/18 1204)  dexamethasone (DECADRON) injection 10 mg (10 mg Intramuscular Given 06/30/18 1204)     Initial Impression / Assessment and Plan / ED Course  I have reviewed the triage vital signs and the nursing notes.  Pertinent labs & imaging results that were available during my care of the patient were reviewed by me and considered in my medical  decision making (see chart for details).     Patient with complaint of sore throat and myalgias as well as fevers and chills for 2 days.  She is afebrile but has with tonsillar exudate, cervical lymphadenopathy, & dysphagia; strep test today was positive. Treated in the ED with steroids and PCN IM.  Pt appears mildly dehydrated, discussed importance of water rehydration. Presentation non concerning for PTA or infxn spread to soft tissue. No trismus or uvula deviation.  Has had nausea and vomiting earlier today.  Abdomen is soft and nontender.  Doubt acute intra-abdominal pathology.  Was given Zofran and p.o. challenged and was able to tolerate fluids in the ED without difficulty.  Will discharge with small amount of Zofran.  Specific return precautions discussed. Pt able to drink water in ED without difficulty with intact air way. Recommended PCP follow up. Pt verbalized understanding of and agreement with plan and is safe for discharge home at this time.   Final Clinical Impressions(s) / ED Diagnoses   Final diagnoses:  Strep pharyngitis    ED Discharge Orders        Ordered    ondansetron (ZOFRAN ODT) 4 MG disintegrating tablet  Every 8 hours PRN     06/30/18 1252       Jeanie SewerFawze, Dreyden Rohrman A, PA-C 06/30/18 1252    Jacalyn LefevreHaviland, Julie, MD 06/30/18 1614

## 2018-06-30 NOTE — ED Notes (Signed)
Pt left without discharge paperwork or prescription.

## 2018-07-22 ENCOUNTER — Ambulatory Visit: Payer: Medicaid Other | Admitting: Obstetrics and Gynecology

## 2018-07-22 ENCOUNTER — Encounter: Payer: Self-pay | Admitting: Obstetrics and Gynecology

## 2018-07-22 VITALS — BP 109/70 | HR 70 | Ht 66.0 in | Wt 293.0 lb

## 2018-07-22 DIAGNOSIS — G5792 Unspecified mononeuropathy of left lower limb: Secondary | ICD-10-CM | POA: Diagnosis not present

## 2018-07-22 MED ORDER — OXYCODONE HCL 7.5 MG PO TABS: 1.0000 | ORAL_TABLET | Freq: Four times a day (QID) | ORAL | 0 refills | Status: DC | PRN

## 2018-07-22 NOTE — Progress Notes (Addendum)
Patient ID: Barbara Jennings Manocchio, female   DOB: 11/20/1981, 37 y.o.   MRN: [id number]    [facility]  @DATE @            Patient name: Barbara Jennings Kaplan MRN [id number]  Date of birth: 02/11/1981  CC & HPI:  Barbara Jennings Mcclatchy is a 37 y.o. 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 She started the whole30 diet three days ago. She has downloaded an app to help her track her food and reports exercising in the pool more than she used to. She takes Lipitor. Her mom was just diagnosed with lung cancer. Her family doctor is Dr. Phillips OdorGolding.  [facility] the patient denies fever, chills or any other symptoms or complaints at this time.    ROS:  ROS -fever -chills FAM HX All systems are negative except as noted in the HPI and PMH.   Pertinent History Reviewed:   Reviewed Medical         Past Medical History:  Diagnosis Date  . 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 Abnormal Pap smear   . Abnormal uterine bleeding (AUB) 10/11/2014  . Anxiety   . Asthma   . Depression   . Dysmenorrhea 10/11/2014  . HSV-2 (herpes simplex virus 2) infection   . Hx of migraines 02/09/2014   Has ?aura, will rx POP  . Ilioinguinal neuralgia    left  . Migraine   . Other and unspecified ovarian cyst 02/06/2014  . Ovarian cyst 10/18/2014  . Panic attack   . Thickened endometrium 10/18/2014  . Unspecified symptom associated with female genital organs 01/27/2014  . Vaginal Pap smear, abnormal   . Vulvar abscess 03/09/2014   [facility] was rx'd bactrim ds x 10 days                              Surgical Hx:    Past Surgical History:  Procedure Laterality Date  . ABDOMINAL HYSTERECTOMY    . APPENDECTOMY    . CESAREAN SECTION    . CHOLECYSTECTOMY    . DILATION AND CURETTAGE OF UTERUS    . LAPAROSCOPIC SUPRACERVICAL  HYSTERECTOMY N/A 01/02/2015   Procedure: ATTEMPTED LAPAROSCOPIC SUPRACERVICAL HYSTERECTOMY CONVERTED TO OPEN AT [id number];  Surgeon: Tilda BurrowJohn Dorn Hartshorne V, MD;  Location: [facility];  Service: Gynecology;  Laterality: N/A;  . OVARIAN CYST REMOVAL    . SCAR REVISION N/A 06/26/2015   Procedure: ABDOMINAL SCAR REVISION;  Surgeon: Tilda BurrowJohn Tiffeny Minchew V, MD;  Location: [facility];  Service: Gynecology;  Laterality: N/A;  . SUPRACERVICAL ABDOMINAL HYSTERECTOMY N/A 01/02/2015   Procedure: HYSTERECTOMY SUPRACERVICAL ABDOMINAL;  Surgeon: Tilda BurrowJohn Angus Amini V, MD;  Location: [facility];  Service: Gynecology;  Laterality: N/A;   Medications: Reviewed & Updated - see associated section                       Current Outpatient Medications:  .  atorvastatin (LIPITOR) 20 MG tablet, Take 20 mg by mouth daily., Disp: , Rfl:  .  ALPRAZolam (XANAX) 0.5 MG tablet, Take 0.5 mg by mouth every 8 (eight) hours as needed for anxiety., Disp: , Rfl:  .  oxyCODONE HCl 7.5 MG TABA, Take 1 tablet by mouth every 6 (six)  hours as needed., Disp: 120 tablet, Rfl: 0 .  phentermine 37.5 MG capsule, Take 1 capsule (37.5 mg total) by mouth every morning. (Patient not taking: Reported on 07/22/2018), Disp: 30 capsule, Rfl: 1   Social History: Reviewed -  reports that she has been smoking e-cigarettes.  She has been smoking about 0.00 packs per day for the past 12.00 years. She has never used smokeless tobacco.  Objective Findings:  Vitals: Blood pressure 109/70, pulse 70, height 5\' 6"  (1.676 Jennings), weight 293 lb (132.9 kg).  PHYSICAL EXAMINATION General appearance - alert, well appearing, and in no distress, oriented to person, place, and time and overweight Mental status - alert, oriented to person, place, and time, normal mood, behavior, speech, dress, motor activity, and thought processes PELVIC DEFERRED  Assessment & Plan:   A:  1.  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 Obesity Body mass index is 47.29 kg/Jennings.  3. Hyperlipidemia, recent dx 4.   P:   1. Continue Rx oxycodone 7.5mg  3x per day 2. Weight loss 3. F/U in 3 months, escribe the meds monthly 4. Consider laparoscopy once weight loss achieved    By signing my name below, I, [name], attest that this documentation has been prepared under the direction and in the presence of Tilda Burrow, MD. Electronically Signed: [name], Medical Scribe. 07/22/18. 9:21 AM.  I personally performed the services described in this documentation, which was SCRIBED in my presence. The recorded information has been reviewed and considered accurate. It has been edited as necessary during review. Tilda Burrow, MD

## 2018-08-16 ENCOUNTER — Other Ambulatory Visit: Payer: Self-pay

## 2018-08-16 ENCOUNTER — Other Ambulatory Visit: Payer: Self-pay | Admitting: Obstetrics and Gynecology

## 2018-08-16 MED ORDER — OXYCODONE HCL 7.5 MG PO TABS: 1.0000 | ORAL_TABLET | Freq: Four times a day (QID) | ORAL | 0 refills | Status: DC | PRN

## 2018-08-16 NOTE — Progress Notes (Signed)
refil oxycodone

## 2018-09-19 ENCOUNTER — Other Ambulatory Visit: Payer: Self-pay

## 2018-09-20 ENCOUNTER — Other Ambulatory Visit: Payer: Self-pay

## 2018-09-27 ENCOUNTER — Other Ambulatory Visit: Payer: Self-pay

## 2018-09-27 ENCOUNTER — Telehealth: Payer: Self-pay | Admitting: *Deleted

## 2018-09-29 ENCOUNTER — Encounter: Payer: Self-pay | Admitting: *Deleted

## 2018-09-29 ENCOUNTER — Other Ambulatory Visit: Payer: Self-pay | Admitting: Obstetrics and Gynecology

## 2018-09-29 MED ORDER — OXYCODONE HCL 7.5 MG PO TABS: 1.0000 | ORAL_TABLET | Freq: Four times a day (QID) | ORAL | 0 refills | Status: DC | PRN

## 2018-09-29 NOTE — Progress Notes (Signed)
refil oxycodone done

## 2018-10-21 ENCOUNTER — Encounter: Payer: Self-pay | Admitting: Obstetrics and Gynecology

## 2018-10-21 ENCOUNTER — Ambulatory Visit: Payer: Medicaid Other | Admitting: Obstetrics and Gynecology

## 2018-10-21 VITALS — BP 125/70 | HR 70 | Ht 66.0 in | Wt 293.4 lb

## 2018-10-21 DIAGNOSIS — G5792 Unspecified mononeuropathy of left lower limb: Secondary | ICD-10-CM | POA: Diagnosis not present

## 2018-10-21 MED ORDER — OXYCODONE HCL 7.5 MG PO TABS: 1.0000 | ORAL_TABLET | Freq: Four times a day (QID) | ORAL | 0 refills | Status: DC | PRN

## 2018-10-21 MED ORDER — GABAPENTIN 300 MG PO CAPS: 300.0000 mg | ORAL_CAPSULE | Freq: Three times a day (TID) | ORAL | 2 refills | Status: DC

## 2018-10-21 NOTE — Progress Notes (Signed)
Patient ID: SHELVY HECKERT, female   DOB: July 14, 1981, 37 y.o.   MRN: [id number]    [facility] Visit  @DATE @            Patient name: Barbara Jennings MRN [id number]  Date of birth: 1981/09/19  CC & HPI:  Barbara Jennings is a 37 y.o. 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 See prior lengthy notes about her care.  She is been referred to [facility] pelvic pain clinic without success pt has tried Lyrica and Neurontin with no success.  She is willing to try it again. Neither was more effective than the other and she was taken off of Lyrica because of her hx of depression/anxiety.  She does not describe any identified side effects to the medicine.  She denies suicidal or homicidal ideation.  She does describe a history of "panic attacks occurring 1-2 times per month for which she has received a Xanax prescription from her primary care physician and uses 1 to 2 pills/month.  She has used a TENS unit before, still has it, but stopped using it.  Patient advised to restart TENS unit use. Denies SI.  She states the pain in the left lower quadrant is always a 7 out of 10. Today she also has complaints of right lower quadrant pain  She describes the right lower quadrant pain is intermittent spasmodic and not affected by movement.  It can occur at rest or at activity and is rated a 4 5.  She focuses the first half of the conversation on this and then states that while she just has the baseline pain on the left which is worse. Patient describes her self is wanting a better life, just wanting to get her life back.  She agrees to a strategy of restarting and going through the pain management protocols again.  She would accept referral again if I can identify a another source of consultation  ROS:  ROS + LLQ pain chronic since hysterectomy - fever - chills  All systems are negative except as noted in the HPI  and PMH.  Bowel movements are once daily usually in the mornings, occasionally second bowel movement in the afternoons.  The food consumed does not affect the pain.  Bowel movements do not affect the pain p    Pertinent History Reviewed:   Reviewed: Significant for panic attacks managed by Dr. Phillips Odor, referrals to [facility]  pelvic pain clinic without success Medical         Past Medical History:  Diagnosis Date  . 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 Abnormal Pap smear   . Abnormal uterine bleeding (AUB) 10/11/2014  . Anxiety   . Asthma   . Depression   . Dysmenorrhea 10/11/2014  . HSV-2 (herpes simplex virus 2) infection   . Hx of migraines 02/09/2014   Has ?aura, will rx POP  . Ilioinguinal neuralgia    left  . Migraine   . Other and unspecified ovarian cyst 02/06/2014  . Ovarian cyst 10/18/2014  . Panic attack   . Thickened endometrium 10/18/2014  . Unspecified symptom associated with female genital organs 01/27/2014  . Vaginal Pap smear, abnormal   . Vulvar abscess 03/09/2014   I&D 3/9 in ER at [facility] was rx'd bactrim ds x  10 days                              Surgical Hx:    Past Surgical History:  Procedure Laterality Date  . ABDOMINAL HYSTERECTOMY    . APPENDECTOMY    . CESAREAN SECTION    . CHOLECYSTECTOMY    . DILATION AND CURETTAGE OF UTERUS    . LAPAROSCOPIC SUPRACERVICAL HYSTERECTOMY N/A 01/02/2015   Procedure: ATTEMPTED LAPAROSCOPIC SUPRACERVICAL HYSTERECTOMY CONVERTED TO OPEN AT 4098;  Surgeon: Tilda Burrow, MD;  Location: [facility];  Service: Gynecology;  Laterality: N/A;  . OVARIAN CYST REMOVAL    . SCAR REVISION N/A 06/26/2015   Procedure: ABDOMINAL SCAR REVISION;  Surgeon: Tilda Burrow, MD;  Location: [facility];  Service: Gynecology;  Laterality: N/A;  . SUPRACERVICAL ABDOMINAL HYSTERECTOMY N/A 01/02/2015   Procedure: HYSTERECTOMY SUPRACERVICAL  ABDOMINAL;  Surgeon: Tilda Burrow, MD;  Location: [facility];  Service: Gynecology;  Laterality: N/A;   Medications: Reviewed & Updated - see associated section                       Current Outpatient Medications:  .  ALPRAZolam (XANAX) 0.5 MG tablet, Take 0.5 mg by mouth every 8 (eight) hours as needed for anxiety., Disp: , Rfl:  .  atorvastatin (LIPITOR) 20 MG tablet, Take 20 mg by mouth daily., Disp: , Rfl:  .  oxyCODONE HCl 7.5 MG TABA, Take 1 tablet by mouth every 6 (six) hours as needed., Disp: 120 tablet, Rfl: 0  Social History: Reviewed -  reports that she has been smoking e-cigarettes. She has been smoking about 0.00 packs per day for the past 12.00 years. She has never used smokeless tobacco.  Objective Findings:  Vitals: Blood pressure 125/70, pulse 70, height 5\' 6"  (1.676 m), weight 293 lb 6.4 oz (133.1 kg).  PHYSICAL EXAMINATION General appearance - alert, well appearing, and in no distress, oriented to person, place, and time and overweight Mental status - alert, oriented to person, place, and time, normal mood, behavior, speech, dress, motor activity, and thought processes, affect appropriate to mood Abdomen - Lying back results in pulling sensation. Light touch not painful, but tickles. Normal pitched bowel. Moderate discomfort in LLQ. Bowel sounds are normal in pitch, there is no guarding, no rebound but she does find left lower quadrant palpation uncomfortable PELVIC DEFERRED  Assessment & Plan:   A:  1. Chronic LLQ pain 2. Functional bowel pain, right side 3. Ilioinguinal neuralgia, stable  P:  1. Use TENS unit at night for at least one week, then try mornings. 2. Rx Neurontin 300 mg BID 3. Rx Oxycodone For.  Patient is to give her a status report on her pain at 2 weeks by sending a note to me in my chart  y signing my name below, I, [name], attest that this documentation has been prepared under the direction and in the presence of Tilda Burrow,  MD. Electronically Signed: [name], Medical Scribe. 10/21/18. 8:59 AM.  I personally performed the services described in this documentation, which was SCRIBED in my presence. The recorded information has been reviewed and considered accurate. It has been edited as necessary during review. Tilda Burrow, MD

## 2018-10-22 ENCOUNTER — Ambulatory Visit: Payer: Medicaid Other | Admitting: Obstetrics and Gynecology

## 2018-11-23 ENCOUNTER — Other Ambulatory Visit: Payer: Self-pay

## 2018-11-23 ENCOUNTER — Other Ambulatory Visit: Payer: Self-pay | Admitting: Obstetrics and Gynecology

## 2018-11-23 MED ORDER — OXYCODONE HCL 7.5 MG PO TABS: 1.0000 | ORAL_TABLET | Freq: Four times a day (QID) | ORAL | 0 refills | Status: DC | PRN

## 2018-11-23 NOTE — Progress Notes (Signed)
Oxycodone 7/5 refilled x 30 d

## 2018-11-29 ENCOUNTER — Ambulatory Visit: Payer: Medicaid Other | Admitting: Obstetrics and Gynecology

## 2018-12-01 ENCOUNTER — Ambulatory Visit: Payer: Medicaid Other | Admitting: Obstetrics and Gynecology

## 2018-12-01 ENCOUNTER — Encounter: Payer: Self-pay | Admitting: Obstetrics and Gynecology

## 2018-12-01 DIAGNOSIS — F4321 Adjustment disorder with depressed mood: Secondary | ICD-10-CM | POA: Diagnosis not present

## 2018-12-01 MED ORDER — GABAPENTIN 300 MG PO CAPS: 300.0000 mg | ORAL_CAPSULE | Freq: Three times a day (TID) | ORAL | 2 refills | Status: DC

## 2018-12-01 MED ORDER — OXYCODONE HCL 7.5 MG PO TABS
1.0000 | ORAL_TABLET | Freq: Four times a day (QID) | ORAL | 0 refills | Status: DC | PRN
Start: 2018-12-01 — End: 2018-12-30

## 2018-12-01 NOTE — Progress Notes (Signed)
Patient ID: Barbara Jennings, female   DOB: 03/18/1981, 37 y.o.   MRN: [id number]    [facility]  @DATE @            Patient name: Barbara Jennings MRN [id number]  Date of birth: 10/15/1981  CC & HPI:  Barbara Jennings is a 37 y.o. female presenting today for for her follow up for her chronic pelvic pain and medication. Says that she is both tired and depressed. Did not sleep well last night, her mind is occupied with family stressors. Went back to working but could not stay due to pains and has since not been back. Has questions on when she can get mammogram due to maternal hx of breast cancer.  Hasn't noticed difference since adding Neurotin, since restarting medication, says that she is not convinced   ROS:  ROS +chronic pelvic pain -fever -chills +   Pertinent History Reviewed:   Reviewed: Medical         Past Medical History:  Diagnosis Date  . 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 Abnormal Pap smear   . Abnormal uterine bleeding (AUB) 10/11/2014  . Anxiety   . Asthma   . Depression   . Dysmenorrhea 10/11/2014  . HSV-2 (herpes simplex virus 2) infection   . Hx of migraines 02/09/2014   Has ?aura, will rx POP  . Ilioinguinal neuralgia    left  . Migraine   . Other and unspecified ovarian cyst 02/06/2014  . Ovarian cyst 10/18/2014  . Panic attack   . Thickened endometrium 10/18/2014  . Unspecified symptom associated with female genital organs 01/27/2014  . Vaginal Pap smear, abnormal   . Vulvar abscess 03/09/2014   I&D 3/9 in ER at [facility] was rx'd bactrim ds x 10 days                              Surgical Hx:    Past Surgical History:  Procedure Laterality Date  . ABDOMINAL HYSTERECTOMY    . APPENDECTOMY    . CESAREAN SECTION    . CHOLECYSTECTOMY    . DILATION AND CURETTAGE OF UTERUS    . LAPAROSCOPIC SUPRACERVICAL HYSTERECTOMY N/A 01/02/2015   Procedure:  ATTEMPTED LAPAROSCOPIC SUPRACERVICAL HYSTERECTOMY CONVERTED TO OPEN AT [id number];  Surgeon: Tilda BurrowJohn Kimley Apsey V, MD;  Location: [facility];  Service: Gynecology;  Laterality: N/A;  . OVARIAN CYST REMOVAL    . SCAR REVISION N/A 06/26/2015   Procedure: ABDOMINAL SCAR REVISION;  Surgeon: Tilda BurrowJohn Delise Simenson V, MD;  Location: [facility];  Service: Gynecology;  Laterality: N/A;  . SUPRACERVICAL ABDOMINAL HYSTERECTOMY N/A 01/02/2015   Procedure: HYSTERECTOMY SUPRACERVICAL ABDOMINAL;  Surgeon: Tilda BurrowJohn James Senn V, MD;  Location: [facility];  Service: Gynecology;  Laterality: N/A;   Medications: Reviewed & Updated - see associated section                       Current Outpatient Medications:  .  ALPRAZolam (XANAX) 0.5 MG tablet, Take 0.5 mg by mouth every 8 (eight) hours as needed for anxiety., Disp: , Rfl:  .  atorvastatin (LIPITOR) 20 MG tablet, Take 20 mg by mouth daily., Disp: , Rfl:  .  gabapentin (NEURONTIN) 300 MG capsule, Take 1 capsule (300 mg total) by mouth 3 (three) times daily., Disp: 90 capsule, Rfl: 2 .  oxyCODONE HCl 7.5 MG TABA, Take 1 tablet by mouth every 6 (six) hours as needed., Disp: 120 tablet, Rfl: 0   Social History: Reviewed -  reports that she has been smoking e-cigarettes. She has been smoking about 0.00 packs per day for the past 12.00 years. She has never used smokeless tobacco.   Breast cancer runs in maternally (mothers sisters, one aunt was 27 and second aunt was 10 when diagnosed) in her family. Mother was recently diagnosed with stage IV brain cancer, started in lung then metastasize to lymph nodes and the brain 60 mets in brain. Mother smoked in her younger years. Official diagnosis was 2 weeks ago. Mother has started radiation, per patient mother has over 60 tumors in brain. Mother is 20, has a will. Her mother and father divorced 10 years ago. Pt is an ONLY Child   Registered to go back to school for spring semester.  Objective Findings:  Vitals: Blood pressure 138/87, pulse 74, height 5\' 6"   (1.676 m), weight 299 lb 9.6 oz (135.9 kg).  PHYSICAL EXAMINATION General appearance - oriented to person, place, and time Mental status - depressed mood, behavior, speech, dress, motor activity, and thought processes, anxious  PELVIC NOT DONE  Assessment & Plan:   A:  1. Chronic LLQ pain 2. Ilioinguinal neuralgia, stable 3    Greiving over mother's dx, P:  1. Plan to switch to Lyrica from Gabapentin if pt continues to note minimal benefit from Gabepentin 2. Medication management Refil Gabepentin 300 tid and Oxycodone 7.5 3. Pt receives Xanax from Dr Phillips Odor for PRN use. 4. F/u in 1 month 5. MD to look for Hospice info.    By signing my name below, I, Arnette Norris, attest that this documentation has been prepared under the direction and in the presence of Tilda Burrow, MD. Electronically Signed: Arnette Norris Medical Scribe. 12/01/18. 8:57 AM.  I personally performed the services described in this documentation, which was SCRIBED in my presence. The recorded information has been reviewed and considered accurate. It has been edited as necessary during review. Tilda Burrow, MD

## 2018-12-30 ENCOUNTER — Ambulatory Visit: Payer: Medicaid Other | Admitting: Obstetrics and Gynecology

## 2018-12-30 ENCOUNTER — Encounter: Payer: Self-pay | Admitting: Obstetrics and Gynecology

## 2018-12-30 VITALS — BP 108/63 | HR 61 | Ht 66.0 in | Wt 294.0 lb

## 2018-12-30 DIAGNOSIS — G5792 Unspecified mononeuropathy of left lower limb: Secondary | ICD-10-CM | POA: Diagnosis not present

## 2018-12-30 MED ORDER — OXYCODONE HCL 7.5 MG PO TABS: 1.0000 | ORAL_TABLET | Freq: Four times a day (QID) | ORAL | 0 refills | Status: DC | PRN

## 2018-12-30 MED ORDER — PREGABALIN 75 MG PO CAPS: 75.0000 mg | ORAL_CAPSULE | Freq: Two times a day (BID) | ORAL | 2 refills | Status: DC

## 2018-12-30 NOTE — Progress Notes (Signed)
Patient ID: Barbara Jennings, female   DOB: Oct 15, 1981, 38 y.o.   MRN: [id number]    [facility] Clinic Visit  @DATE @            Patient name: Barbara Jennings MRN [id number]  Date of birth: 12-26-1981  CC & HPI:  [name] is a 38 y.o. female presenting today for a follow up for her chronic LLQ pain. She has been on Gabapentin 300 TID and oxycodone 7.5. She is having constant pain in her bowels that she describes as "very low and in the middle." Gabapentin is not really helping and pt does not use Tylenol. She had a hysterectomy but pt says her right? ovary was not removed.  Breast cancer runs maternally (mothers sisters, one aunt was 11 and second aunt was 104 when diagnosed) in her family. Mother was recently diagnosed (10/2018) with stage IV brain cancer, started in lung then metastasize to lymph nodes and the brain 60 mets in brain. mother cannot keep food down and is still doing chemo, now regimen 6 of 12. The patient denies fever, chills or any other symptoms or complaints at this time.   ROS:  ROS + constant pain in low middle abdomen - fever - chills All systems are negative except as noted in the HPI and PMH.   Pertinent History Reviewed:   Reviewed: Medical         Past Medical History:  Diagnosis Date  . 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 Abnormal Pap smear   . Abnormal uterine bleeding (AUB) 10/11/2014  . Anxiety   . Asthma   . Depression   . Dysmenorrhea 10/11/2014  . HSV-2 (herpes simplex virus 2) infection   . Hx of migraines 02/09/2014   Has ?aura, will rx POP  . Ilioinguinal neuralgia    left  . Migraine   . Other and unspecified ovarian cyst 02/06/2014  . Ovarian cyst 10/18/2014  . Panic attack   . Thickened endometrium 10/18/2014  . Unspecified symptom associated with female genital organs 01/27/2014  . Vaginal Pap smear, abnormal   . Vulvar abscess  03/09/2014   I&D 3/9 in ER at [facility] was rx'd bactrim ds x 10 days                              Surgical Hx:    Past Surgical History:  Procedure Laterality Date  . ABDOMINAL HYSTERECTOMY    . APPENDECTOMY    . CESAREAN SECTION    . CHOLECYSTECTOMY    . DILATION AND CURETTAGE OF UTERUS    . LAPAROSCOPIC SUPRACERVICAL HYSTERECTOMY N/A 01/02/2015   Procedure: ATTEMPTED LAPAROSCOPIC SUPRACERVICAL HYSTERECTOMY CONVERTED TO OPEN AT 4287;  Surgeon: Tilda Burrow, MD;  Location: [facility];  Service: Gynecology;  Laterality: N/A;  . OVARIAN CYST REMOVAL    . SCAR REVISION N/A 06/26/2015   Procedure: ABDOMINAL SCAR REVISION;  Surgeon: Tilda Burrow, MD;  Location: [facility];  Service: Gynecology;  Laterality: N/A;  . SUPRACERVICAL ABDOMINAL HYSTERECTOMY N/A 01/02/2015   Procedure: HYSTERECTOMY SUPRACERVICAL ABDOMINAL;  Surgeon: Tilda Burrow, MD;  Location: [facility];  Service: Gynecology;  Laterality: N/A;   Medications: Reviewed & Updated - see associated section  Current Outpatient Medications:  .  ALPRAZolam (XANAX) 0.5 MG tablet, Take 0.5 mg by mouth every 8 (eight) hours as needed for anxiety., Disp: , Rfl:  .  atorvastatin (LIPITOR) 20 MG tablet, Take 20 mg by mouth daily., Disp: , Rfl:  .  oxyCODONE HCl 7.5 MG TABA, Take 1 tablet by mouth every 6 (six) hours as needed., Disp: 120 tablet, Rfl: 0  Social History: Reviewed -  reports that she has been smoking e-cigarettes. She has been smoking about 0.00 packs per day for the past 12.00 years. She has never used smokeless tobacco.  Objective Findings:  Vitals: Blood pressure 108/63, pulse 61, height 5\' 6"  (1.676 m), weight 294 lb (133.4 kg).  PHYSICAL EXAMINATION General appearance - alert, well appearing, and in no distress, oriented to person, place, and time and overweight Mental status - alert, oriented to person, place, and time, normal mood, behavior, speech, dress, motor activity, and thought processes, affect  appropriate to mood  PELVIC DEFERRED  Assessment & Plan:   A:  1. Chronic LLQ pain 2. Ilioinguinal neuralgia, stable 3. Obesity: Body mass index is 47.45 kg/m.  P:  1. Received Xanax from Dr. Phillips Odor for prn use 2. Rx Lyrica, discontinue Gabapentin in a trial of meds 3. Refill Rx Oxycodone 7.5 120 tab 4. F/U 2 months  By signing my name below, I, [name], attest that this documentation has been prepared under the direction and in the presence of Tilda Burrow, MD. Electronically Signed: [name], Medical Scribe. 12/30/18. 11:07 AM.  I personally performed the services described in this documentation, which was SCRIBED in my presence. The recorded information has been reviewed and considered accurate. It has been edited as necessary during review. Tilda Burrow, MD

## 2018-12-30 NOTE — Addendum Note (Signed)
Addended by: Tilda Burrow on: 12/30/2018 04:33 PM   Modules accepted: Orders

## 2018-12-31 MED ORDER — DULOXETINE HCL 30 MG PO CPEP: 30.0000 mg | ORAL_CAPSULE | Freq: Every day | ORAL | 3 refills | Status: DC

## 2018-12-31 NOTE — Addendum Note (Signed)
Addended by: Tilda Burrow on: 12/31/2018 01:41 PM   Modules accepted: Orders

## 2019-01-07 ENCOUNTER — Other Ambulatory Visit: Payer: Self-pay | Admitting: *Deleted

## 2019-01-07 ENCOUNTER — Other Ambulatory Visit: Payer: Self-pay | Admitting: Obstetrics and Gynecology

## 2019-02-06 ENCOUNTER — Other Ambulatory Visit: Payer: Self-pay

## 2019-02-07 ENCOUNTER — Other Ambulatory Visit: Payer: Self-pay | Admitting: Obstetrics and Gynecology

## 2019-02-07 MED ORDER — OXYCODONE HCL 15 MG PO TABS: ORAL_TABLET | ORAL | 0 refills | Status: DC

## 2019-02-07 NOTE — Progress Notes (Signed)
Oxycodone for chronic ileoinguinal neuralgia refilled.

## 2019-03-02 ENCOUNTER — Ambulatory Visit: Payer: Medicaid Other | Admitting: Obstetrics and Gynecology

## 2019-03-02 ENCOUNTER — Encounter: Payer: Self-pay | Admitting: Obstetrics and Gynecology

## 2019-03-02 ENCOUNTER — Other Ambulatory Visit: Payer: Self-pay

## 2019-03-02 VITALS — BP 122/64 | HR 71 | Ht 66.0 in | Wt 301.0 lb

## 2019-03-02 DIAGNOSIS — F4321 Adjustment disorder with depressed mood: Secondary | ICD-10-CM | POA: Diagnosis not present

## 2019-03-02 DIAGNOSIS — G5792 Unspecified mononeuropathy of left lower limb: Secondary | ICD-10-CM | POA: Diagnosis not present

## 2019-03-02 MED ORDER — DULOXETINE HCL 30 MG PO CPEP: 30.0000 mg | ORAL_CAPSULE | Freq: Every day | ORAL | 3 refills | Status: DC

## 2019-03-02 MED ORDER — OXYCODONE HCL 15 MG PO TABS: ORAL_TABLET | ORAL | 0 refills | Status: DC

## 2019-03-02 MED ORDER — PHENTERMINE HCL 37.5 MG PO CAPS: 37.5000 mg | ORAL_CAPSULE | ORAL | 0 refills | Status: DC

## 2019-03-02 NOTE — Progress Notes (Signed)
Patient ID: Barbara Jennings, female   DOB: 03/25/1981, 38 y.o.   MRN: [id number]    [facility] Clinic Visit  @DATE @            Patient name: Barbara Jennings MRN [id number]  Date of birth: Apr 30, 1981  CC & HPI:  Barbara Jennings is a 38 y.o. female presenting today for f/u of chronic ileoinguinal neuralgia. Mother, who is 73, has been in hospice house for 3 weeks. Says that sometimes mother is fully aware but mostly 4-5 days out of the week mother has dementia due to the number of tumor in brain. Tumors started with spot in in right primary lung, moved to lymph nodes and chest wall, then moved to the brain.  ROS:  ROS +weight gain +chronic ileoinguinal neuralgia  Pertinent History Reviewed:   Reviewed: Medical         Past Medical History:  Diagnosis Date  . 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 Abnormal Pap smear   . Abnormal uterine bleeding (AUB) 10/11/2014  . Anxiety   . Asthma   . Depression   . Dysmenorrhea 10/11/2014  . HSV-2 (herpes simplex virus 2) infection   . Hx of migraines 02/09/2014   Has ?aura, will rx POP  . Ilioinguinal neuralgia    left  . Migraine   . Other and unspecified ovarian cyst 02/06/2014  . Ovarian cyst 10/18/2014  . Panic attack   . Thickened endometrium 10/18/2014  . Unspecified symptom associated with female genital organs 01/27/2014  . Vaginal Pap smear, abnormal   . Vulvar abscess 03/09/2014   I&D 3/9 in ER at [facility] was rx'd bactrim ds x 10 days                              Surgical Hx:    Past Surgical History:  Procedure Laterality Date  . ABDOMINAL HYSTERECTOMY    . APPENDECTOMY    . CESAREAN SECTION    . CHOLECYSTECTOMY    . DILATION AND CURETTAGE OF UTERUS    . LAPAROSCOPIC SUPRACERVICAL HYSTERECTOMY N/A 01/02/2015   Procedure: ATTEMPTED LAPAROSCOPIC SUPRACERVICAL HYSTERECTOMY CONVERTED TO OPEN AT 1761;  Surgeon: Tilda Burrow, MD;   Location: [facility];  Service: Gynecology;  Laterality: N/A;  . OVARIAN CYST REMOVAL    . SCAR REVISION N/A 06/26/2015   Procedure: ABDOMINAL SCAR REVISION;  Surgeon: Tilda Burrow, MD;  Location: [facility];  Service: Gynecology;  Laterality: N/A;  . SUPRACERVICAL ABDOMINAL HYSTERECTOMY N/A 01/02/2015   Procedure: HYSTERECTOMY SUPRACERVICAL ABDOMINAL;  Surgeon: Tilda Burrow, MD;  Location: [facility];  Service: Gynecology;  Laterality: N/A;   Medications: Reviewed & Updated - see associated section                       Current Outpatient Medications:  .  ALPRAZolam (XANAX) 0.5 MG tablet, Take 0.5 mg by mouth every 8 (eight) hours as needed for anxiety., Disp: , Rfl:  .  atorvastatin (LIPITOR) 20 MG tablet, Take 20 mg by mouth daily., Disp: , Rfl:  .  DULoxetine (CYMBALTA) 30 MG capsule, Take 1 capsule (30 mg total) by mouth daily., Disp: 30 capsule, Rfl: 3 .  oxyCODONE (ROXICODONE) 15 MG immediate release tablet, TAKE 1/2 TABLET BY MOUTH EVERY SIX HOURS AS NEEDED., Disp: 60 tablet, Rfl:  0   Social History: Reviewed -  reports that she has been smoking e-cigarettes. She has been smoking about 0.00 packs per day for the past 12.00 years. She has never used smokeless tobacco.  Objective Findings:  Vitals: There were no vitals taken for this visit.  PHYSICAL EXAMINATION General appearance - alert and in no distress and anxious, weight gain Mental status - alert, oriented to person, place, and time, normal behavior, speech, dress, motor activity, and thought processes, depressed mood from life stressors  PELVIC DEFERRED  Discussion: Discussed with pt weight loss methods including food measurement and calorie counting using apps such as MyNetDiary or My Fitness Pal. Recommended the use of measuring cups and spoons to monitor serving sizes. Encouraged adequate daily water intake, especially prior to meals to eliminate overeating. Additionally encouraged pt to become more active by taking daily walks  of at least 30 minute duration, join a local gym such as the [facility] or attend water aerobics classes. Also advised pt to use pedometer on smartphone or utilize a smartband such as FitBit to keep track of daily activity.   At end of discussion, pt had opportunity to ask questions and has no further questions at this time.   Specific discussion of lifestyle changes, behavioral modifications and healthy eating habits as noted above. Greater than 50% was spent in counseling and coordination of care with the patient.  Total time greater than: 15 minutes.    Assessment & Plan:   A:  1. Chronic ileoinguinal neuralgia 2. Weight gain, due to stress eating( mom in Hospice, stage 4 cancer) 3. Weight loss discussion  P:  1. Medication management 2. Rx Phentermine, oxycodone  3. 2 month f/u    By signing my name below, I, Arnette Norris, attest that this documentation has been prepared under the direction and in the presence of Tilda Burrow, MD. Electronically Signed: Arnette Norris Medical Scribe. 03/02/19. 9:01 AM.  I personally performed the services described in this documentation, which was SCRIBED in my presence. The recorded information has been reviewed and considered accurate. It has been edited as necessary during review. Tilda Burrow, MD

## 2019-03-02 NOTE — Patient Instructions (Signed)

## 2019-03-02 NOTE — Telephone Encounter (Signed)
Note sent to nurse. 

## 2019-04-04 ENCOUNTER — Other Ambulatory Visit: Payer: Self-pay | Admitting: Obstetrics and Gynecology

## 2019-04-04 MED ORDER — OXYCODONE HCL 15 MG PO TABS: ORAL_TABLET | ORAL | 0 refills | Status: DC

## 2019-04-04 NOTE — Progress Notes (Signed)
Refilled oxycodone 7/5 for ileoinguinal neuralgia, and probable opiate dependence.  Mother just died 27-Mar-2023, will bring pt in soon once appts are resumed p covid-19

## 2019-05-02 ENCOUNTER — Ambulatory Visit: Payer: Medicaid Other | Admitting: Obstetrics and Gynecology

## 2019-05-10 ENCOUNTER — Encounter: Payer: Self-pay | Admitting: *Deleted

## 2019-05-11 ENCOUNTER — Encounter: Payer: Self-pay | Admitting: Obstetrics and Gynecology

## 2019-05-11 ENCOUNTER — Ambulatory Visit: Payer: Medicaid Other | Admitting: Obstetrics and Gynecology

## 2019-05-11 ENCOUNTER — Other Ambulatory Visit: Payer: Self-pay

## 2019-05-11 VITALS — BP 140/80 | HR 89 | Temp 98.3°F | Ht 66.0 in | Wt 290.0 lb

## 2019-05-11 DIAGNOSIS — F4321 Adjustment disorder with depressed mood: Secondary | ICD-10-CM

## 2019-05-11 DIAGNOSIS — G5792 Unspecified mononeuropathy of left lower limb: Secondary | ICD-10-CM

## 2019-05-11 MED ORDER — OXYCODONE HCL 15 MG PO TABS: ORAL_TABLET | ORAL | 0 refills | Status: DC

## 2019-05-11 NOTE — Progress Notes (Signed)
Patient ID: Barbara Jennings, female   DOB: [birth year], 38 y.o.   MRN: [id number]    [facility] Visit  @DATE @            Patient name: Barbara Jennings MRN [id number]  Date of birth: 1981/09/23  CC & HPI:  Barbara Jennings is a 38 y.o. female presenting today for 2 month f/u for phentermine and discussion of weight since mothers passing. It haas been hard since losing her mother and is still very upset. She thought it would be easier since she knew her mother would be passing soon but it doesn't seem easier for her. 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 62 yr old daughter is having difficult time dealing with the loss of her grandmother. Life stress is not getting to the point of her being emotionally disturbed.   Mother's stage 4 cancer started in the lungs. Had 1 tumor in lung 2 mm and metastasized to her lymph nodes then into the brain.  ROS:  ROS   Pertinent History Reviewed:   Reviewed: Medical         Past Medical History:  Diagnosis Date  . 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 Abnormal Pap smear   . Abnormal uterine bleeding (AUB) 10/11/2014  . Anxiety   . Asthma   . Depression   . Dysmenorrhea 10/11/2014  . HSV-2 (herpes simplex virus 2) infection   . Hx of migraines 02/09/2014   Has ?aura, will rx POP  . Ilioinguinal neuralgia    left  . Migraine   . Other and unspecified ovarian cyst 02/06/2014  . Ovarian cyst 10/18/2014  . Panic attack   . Thickened endometrium 10/18/2014  . Unspecified symptom associated with female genital organs 01/27/2014  . Vaginal Pap smear, abnormal   . Vulvar abscess 03/09/2014   I&D 3/9 in ER at [facility] was rx'd bactrim ds x 10 days                              Surgical Hx:    Past Surgical History:  Procedure Laterality Date  . ABDOMINAL HYSTERECTOMY    . APPENDECTOMY    . CESAREAN SECTION    .  CHOLECYSTECTOMY    . DILATION AND CURETTAGE OF UTERUS    . LAPAROSCOPIC SUPRACERVICAL HYSTERECTOMY N/A 01/02/2015   Procedure: ATTEMPTED LAPAROSCOPIC SUPRACERVICAL HYSTERECTOMY CONVERTED TO OPEN AT 0277;  Surgeon: Tilda Burrow, MD;  Location: [facility];  Service: Gynecology;  Laterality: N/A;  . OVARIAN CYST REMOVAL    . SCAR REVISION N/A 06/26/2015   Procedure: ABDOMINAL SCAR REVISION;  Surgeon: Tilda Burrow, MD;  Location: [facility];  Service: Gynecology;  Laterality: N/A;  . SUPRACERVICAL ABDOMINAL HYSTERECTOMY N/A 01/02/2015   Procedure: HYSTERECTOMY SUPRACERVICAL ABDOMINAL;  Surgeon: Tilda Burrow, MD;  Location: [facility];  Service: Gynecology;  Laterality: N/A;   Medications: Reviewed & Updated - see associated section                       Current Outpatient Medications:  .  ALPRAZolam (XANAX) 0.5 MG tablet, Take 0.5 mg by mouth every 8 (eight) hours as needed for anxiety., Disp: , Rfl:  .  atorvastatin (LIPITOR) 20 MG tablet, Take 20 mg by mouth daily., Disp: ,  Rfl:  .  DULoxetine (CYMBALTA) 30 MG capsule, Take 1 capsule (30 mg total) by mouth daily., Disp: 30 capsule, Rfl: 3 .  oxyCODONE (ROXICODONE) 15 MG immediate release tablet, TAKE 1/2 TABLET BY MOUTH EVERY SIX HOURS AS NEEDED., Disp: 60 tablet, Rfl: 0 .  phentermine 37.5 MG capsule, Take 1 capsule (37.5 mg total) by mouth every morning. 2 hours before breakfast with 8 ounces water, Disp: 30 capsule, Rfl: 0   Social History: Reviewed -  reports that she has been smoking e-cigarettes. She has been smoking about 0.00 packs per day for the past 12.00 years. She has never used smokeless tobacco.  Objective Findings:  Vitals: Blood pressure 140/80, pulse 89, temperature 98.3 F (36.8 C), height 5\' 6"  (1.676 m), weight 290 lb (131.5 kg).  PHYSICAL EXAMINATION General appearance - alert, well medically appearing, and in no medical distress Mental status - alert, oriented to person, place, and time, tearful mood, behavior, speech, dress,  motor activity, and thought processes  PELVIC DISCUSSION ONLY  Assessment & Plan:   A:  1. Chronic ileoinguinal neuralgia 2. Emotional stress, death of family member  P:  1. Medication management 2. Rx, oxycodone 3. F/u with televisit    By signing my name below, I, Arnette NorrisMari Johnson, attest that this documentation has been prepared under the direction and in the presence of Tilda BurrowFerguson, Berdella Bacot V, MD. Electronically Signed: Arnette NorrisMari Johnson Medical Scribe. 05/11/19. 11:26 AM.  I personally performed the services described in this documentation, which was SCRIBED in my presence. The recorded information has been reviewed and considered accurate. It has been edited as necessary during review. Tilda BurrowJohn V Mera Gunkel, MD

## 2019-06-07 ENCOUNTER — Other Ambulatory Visit: Payer: Self-pay | Admitting: Obstetrics and Gynecology

## 2019-06-07 MED ORDER — OXYCODONE HCL 15 MG PO TABS: ORAL_TABLET | ORAL | 0 refills | Status: DC

## 2019-06-07 NOTE — Telephone Encounter (Signed)
-----   Message from Jonnie Kind, MD sent at 04/29/2017 10:00 AM EDT ----- Regarding: contact Dr Zigmund Daniel. Refer to [facility]

## 2019-06-07 NOTE — Progress Notes (Signed)
Pt given Rx for 60 more oxycodone for her chronic ileoinguinal neuralgia after hysterectomy.Pt request that referral to another  Facility such as [facility] attempted, if appropriate surgeon can be identified. Pt states she "just wants her life back". Pt is not making any progress at weight loss.

## 2019-07-07 ENCOUNTER — Other Ambulatory Visit: Payer: Self-pay

## 2019-07-07 MED ORDER — OXYCODONE HCL 15 MG PO TABS: ORAL_TABLET | ORAL | 0 refills | Status: DC

## 2019-08-11 ENCOUNTER — Encounter: Payer: Self-pay | Admitting: Obstetrics and Gynecology

## 2019-08-11 ENCOUNTER — Ambulatory Visit (INDEPENDENT_AMBULATORY_CARE_PROVIDER_SITE_OTHER): Payer: Medicaid Other | Admitting: Obstetrics and Gynecology

## 2019-08-11 ENCOUNTER — Other Ambulatory Visit: Payer: Self-pay

## 2019-08-11 VITALS — BP 116/72 | HR 71 | Ht 66.0 in | Wt 297.0 lb

## 2019-08-11 DIAGNOSIS — G5792 Unspecified mononeuropathy of left lower limb: Secondary | ICD-10-CM | POA: Diagnosis not present

## 2019-08-11 MED ORDER — OXYCODONE HCL 15 MG PO TABS: ORAL_TABLET | ORAL | 0 refills | Status: DC

## 2019-08-11 NOTE — Progress Notes (Signed)
Patient ID: Barbara Jennings, female   DOB: 09/30/1981, 38 y.o.   MRN: [id number]    [facility]  @DATE @            Patient name: Barbara Jennings MRN [id number]  Date of birth: 06/24/1981  CC & HPI:  Barbara Jennings is a 38 y.o. female presenting today for a F/U for her inguinal neuralgia. The patient's last visit was on 05/11/2019. She reports some weight gain because she has been eating a lot. The pt and her 388 y.o. daughter are still struggling with the loss of the patient's mother. The patient denies fever, chills or any other symptoms or complaints at this time.   ROS:  ROS  - weight gain - fever - chills All systems are negative except as noted in the HPI and PMH.   Pertinent History Reviewed:   Reviewed: Medical         Past Medical History:  Diagnosis Date  . 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 Abnormal Pap smear   . Abnormal uterine bleeding (AUB) 10/11/2014  . Anxiety   . Asthma   . Depression   . Dysmenorrhea 10/11/2014  . HSV-2 (herpes simplex virus 2) infection   . Hx of migraines 02/09/2014   Has ?aura, will rx POP  . Ilioinguinal neuralgia    left  . Migraine   . Other and unspecified ovarian cyst 02/06/2014  . Ovarian cyst 10/18/2014  . Panic attack   . Thickened endometrium 10/18/2014  . Unspecified symptom associated with female genital organs 01/27/2014  . Vaginal Pap smear, abnormal   . Vulvar abscess 03/09/2014   [facility] bactrim ds x 10 days                              Surgical Hx:    Past Surgical History:  Procedure Laterality Date  . ABDOMINAL HYSTERECTOMY    . APPENDECTOMY    . CESAREAN SECTION    . CHOLECYSTECTOMY    . DILATION AND CURETTAGE OF UTERUS    . LAPAROSCOPIC SUPRACERVICAL HYSTERECTOMY N/A 01/02/2015   Procedure: ATTEMPTED LAPAROSCOPIC SUPRACERVICAL HYSTERECTOMY CONVERTED TO OPEN AT [id number];  Surgeon:  Tilda BurrowJohn Arnaldo Heffron V, MD;  Location: [facility];  Service: Gynecology;  Laterality: N/A;  . OVARIAN CYST REMOVAL    . SCAR REVISION N/A 06/26/2015   Procedure: ABDOMINAL SCAR REVISION;  Surgeon: Tilda BurrowJohn Toyoko Silos V, MD;  Location: [facility];  Service: Gynecology;  Laterality: N/A;  . SUPRACERVICAL ABDOMINAL HYSTERECTOMY N/A 01/02/2015   Procedure: HYSTERECTOMY SUPRACERVICAL ABDOMINAL;  Surgeon: Tilda BurrowJohn Preet Mangano V, MD;  Location: [facility];  Service: Gynecology;  Laterality: N/A;   Medications: Reviewed & Updated - see associated section                       Current Outpatient Medications:  .  ALPRAZolam (XANAX) 0.5 MG tablet, Take 0.5 mg by mouth every 8 (eight) hours as needed for anxiety., Disp: , Rfl:  .  atorvastatin (LIPITOR) 20 MG tablet, Take 20 mg by mouth daily., Disp: , Rfl:  .  DULoxetine (CYMBALTA) 30 MG capsule, Take 1 capsule (30 mg total) by mouth daily., Disp: 30 capsule, Rfl: 3 .  oxyCODONE (ROXICODONE) 15 MG immediate release tablet, TAKE 1/2 TABLET BY MOUTH  EVERY SIX HOURS AS NEEDED., Disp: 60 tablet, Rfl: 0 .  phentermine 37.5 MG capsule, Take 1 capsule (37.5 mg total) by mouth every morning. 2 hours before breakfast with 8 ounces water (Patient not taking: Reported on 08/11/2019), Disp: 30 capsule, Rfl: 0   Social History: Reviewed -  reports that she has been smoking e-cigarettes. She has been smoking about 0.00 packs per day for the past 12.00 years. She has never used smokeless tobacco.  Objective Findings:  Vitals: Blood pressure 116/72, pulse 71, height 5\' 6"  (1.676 m), weight 297 lb (134.7 kg).  PHYSICAL EXAMINATION General appearance - alert, well appearing, and in no distress, oriented to person, place, and time and overweight Mental status - alert, oriented to person, place, and time, normal mood, behavior, speech, dress, motor activity, and thought processes, affect appropriate to mood  PELVIC DEFERRED  Discussion: Discussed with pt the importance of staying physically active and  maintaining a healthy weight. At end of discussion, pt had the opportunity to ask questions and has no further questions at this time.  Specific discussion of weight loss as noted above. Greater than 50% was spent in counseling and coordination of care with the patient.  Total time greater than: 20 minutes.    Assessment & Plan:   A:  1. Chronicileo-Inguinal Neuralgia after supracervical hyst 2. Obesity Body mass index is 47.94 kg/m.  3. Grief p mother's death.  P:  1. F/U in 2 months 2. Renewed Rx Hydrocodone  By signing my name below, I, De Burrs, attest that this documentation has been prepared under the direction and in the presence of Jonnie Kind, MD. Electronically Signed: De Burrs, Medical Scribe. 08/11/19. 9:42 AM.  I personally performed the services described in this documentation, which was SCRIBED in my presence. The recorded information has been reviewed and considered accurate. It has been edited as necessary during review. Jonnie Kind, MD

## 2019-09-06 ENCOUNTER — Other Ambulatory Visit: Payer: Self-pay | Admitting: Obstetrics and Gynecology

## 2019-09-06 ENCOUNTER — Other Ambulatory Visit: Payer: Self-pay

## 2019-09-06 ENCOUNTER — Telehealth: Payer: Self-pay | Admitting: *Deleted

## 2019-09-06 MED ORDER — OXYCODONE HCL 15 MG PO TABS: ORAL_TABLET | ORAL | 0 refills | Status: DC

## 2019-09-06 NOTE — Progress Notes (Signed)
30 day supply of Oxycodone refilled for chronic pain from ileoinguinal neuralgia.

## 2019-09-06 NOTE — Telephone Encounter (Signed)
Pt is requesting a refill on Oxycodone. Thanks!! JSY 

## 2019-09-06 NOTE — Telephone Encounter (Signed)
ordered

## 2019-10-12 ENCOUNTER — Other Ambulatory Visit: Payer: Self-pay

## 2019-10-12 ENCOUNTER — Ambulatory Visit (INDEPENDENT_AMBULATORY_CARE_PROVIDER_SITE_OTHER): Payer: Medicaid Other | Admitting: Obstetrics and Gynecology

## 2019-10-12 ENCOUNTER — Encounter: Payer: Self-pay | Admitting: Obstetrics and Gynecology

## 2019-10-12 VITALS — BP 122/72 | HR 65 | Ht 66.0 in | Wt 299.6 lb

## 2019-10-12 DIAGNOSIS — G5792 Unspecified mononeuropathy of left lower limb: Secondary | ICD-10-CM

## 2019-10-12 MED ORDER — OXYCODONE HCL 15 MG PO TABS: ORAL_TABLET | ORAL | 0 refills | Status: DC

## 2019-10-12 NOTE — Progress Notes (Signed)
Patient ID: LYNDAL ALAMILLO, female   DOB: 03-07-1981, 38 y.o.   MRN: [id number]    [facility] Visit  @DATE @            Patient name: Barbara Jennings MRN [name]  Date of birth: 1981-08-06  CC & HPI:  Barbara Jennings is a 38 y.o. female presenting today for 2 month f/u of chronic ileo-inguinal neuralgia & medication refill. Has pulling pain starts on right and pulls across lower abdomen. Pain has always been on both right and left side but the pain is worse on right side. Had hyst but left cervix.  Phentermine isn't helping and doesn't understand why she isn't loosing weight. She tries to walk more and eat less or not as much junk food. Believes she is going into early menopause. She has bad hot flashes. Daughter pulls her out of her depressive slump since losing her mother 6 months ago.  ROS:  ROS +chronic ileo-inguinal neuralgia +chronic pain +weight gain -perimenopuase -fatigue -chills  Pertinent History Reviewed:   Reviewed:  Medical         Past Medical History:  Diagnosis Date  . 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 Abnormal Pap smear   . Abnormal uterine bleeding (AUB) 10/11/2014  . Anxiety   . Asthma   . Depression   . Dysmenorrhea 10/11/2014  . HSV-2 (herpes simplex virus 2) infection   . Hx of migraines 02/09/2014   Has ?aura, will rx POP  . Ilioinguinal neuralgia    left  . Migraine   . Other and unspecified ovarian cyst 02/06/2014  . Ovarian cyst 10/18/2014  . Panic attack   . Thickened endometrium 10/18/2014  . Unspecified symptom associated with female genital organs 01/27/2014  . Vaginal Pap smear, abnormal   . Vulvar abscess 03/09/2014   I&D 3/9 in [facility] was rx'd bactrim ds x 10 days                              Surgical Hx:    Past Surgical History:  Procedure Laterality Date  . ABDOMINAL HYSTERECTOMY    . APPENDECTOMY    . CESAREAN  SECTION    . CHOLECYSTECTOMY    . DILATION AND CURETTAGE OF UTERUS    . LAPAROSCOPIC SUPRACERVICAL HYSTERECTOMY N/A 01/02/2015   Procedure: ATTEMPTED LAPAROSCOPIC SUPRACERVICAL HYSTERECTOMY CONVERTED TO OPEN AT 03/03/2015;  Surgeon: [id number], MD;  Location: [facility];  Service: Gynecology;  Laterality: N/A;  . OVARIAN CYST REMOVAL    . SCAR REVISION N/A 06/26/2015   Procedure: ABDOMINAL SCAR REVISION;  Surgeon: 06/28/2015, MD;  Location: [facility];  Service: Gynecology;  Laterality: N/A;  . SUPRACERVICAL ABDOMINAL HYSTERECTOMY N/A 01/02/2015   Procedure: HYSTERECTOMY SUPRACERVICAL ABDOMINAL;  Surgeon: 03/03/2015, MD;  Location: [facility];  Service: Gynecology;  Laterality: N/A;   Medications: Reviewed & Updated - see associated section                       Current Outpatient Medications:  .  ALPRAZolam (XANAX) 0.5 MG tablet, Take 0.5 mg by mouth every 8 (eight) hours as needed for anxiety., Disp: , Rfl:  .  atorvastatin (LIPITOR) 20 MG tablet, Take 20 mg by mouth daily., Disp: , Rfl:  .  DULoxetine (CYMBALTA) 30  MG capsule, Take 1 capsule (30 mg total) by mouth daily., Disp: 30 capsule, Rfl: 3 .  oxyCODONE (ROXICODONE) 15 MG immediate release tablet, TAKE 1/2 TABLET BY MOUTH EVERY SIX HOURS AS NEEDED., Disp: 60 tablet, Rfl: 0 .  phentermine 37.5 MG capsule, Take 1 capsule (37.5 mg total) by mouth every morning. 2 hours before breakfast with 8 ounces water, Disp: 30 capsule, Rfl: 0   Social History: Reviewed -  reports that she has been smoking e-cigarettes. She has been smoking about 0.00 packs per day for the past 12.00 years. She has never used smokeless tobacco.  Objective Findings:  Vitals: Blood pressure 122/72, pulse 65, height 5\' 6"  (1.676 m), weight 299 lb 9.6 oz (135.9 kg).  PHYSICAL EXAMINATION General appearance - alert, well appearing, and in no distress, overweight and fatigued due to single night lack of sleep Mental status - alert, oriented to person, place, and time, normal  mood, behavior, speech, dress, motor activity, and thought processes  PELVIC Deferred   Discussion: Discussed with pt weight loss methods including food measurement and calorie counting using apps such as MyNetDiary or My Fitness Pal. Recommended the use of measuring cups and spoons to monitor serving sizes. Encouraged adequate daily water intake, especially prior to meals to eliminate overeating. Additionally encouraged pt to become more active by taking daily walks of at least 30 minute duration, join a local gym such as the [employer] [facility] or attend water aerobics classes. Also advised pt to use pedometer on smartphone or utilize a smartband such as FitBit to keep track of daily activity.   At end of discussion, pt had opportunity to ask questions and has no further questions at this time.   Specific discussion of lifestyle changes, behavioral modifications and healthy eating habits as noted above. Greater than 50% was spent in counseling and coordination of care with the patient.  Total time greater than: 10 minutes.   Assessment & Plan:   A:  1. Chronic ileo-inguinal neuralgia s/p supracervical hyst 2. Chronic pain medication management 3. Grief status post loss of mother  P:  1.  Weight loss w/ upper body weight exercises 2. Medication refill oxycodone  7.5 mg twice daily 3. F/u 3 months PAP, and will discuss proceeding with laparoscopy to see if there is any internal adhesions that might relieve her pain.  Weight loss encouraged    By signing my name below, I, Samul Dada, attest that this documentation has been prepared under the direction and in the presence of Jonnie Kind, MD. Electronically Signed: [place]. 10/12/19. 10:08 AM.  I personally performed the services described in this documentation, which was SCRIBED in my presence. The recorded information has been reviewed and considered accurate. It has been edited as necessary during review. Jonnie Kind,  MD

## 2019-11-09 ENCOUNTER — Emergency Department (HOSPITAL_COMMUNITY): Payer: Medicaid Other

## 2019-11-09 ENCOUNTER — Other Ambulatory Visit: Payer: Self-pay

## 2019-11-09 ENCOUNTER — Other Ambulatory Visit: Payer: Self-pay | Admitting: Obstetrics and Gynecology

## 2019-11-09 ENCOUNTER — Emergency Department (HOSPITAL_COMMUNITY)
Admission: EM | Admit: 2019-11-09 | Discharge: 2019-11-09 | Disposition: A | Discharge status: Home / Self Care | Payer: Medicaid Other | Attending: Emergency Medicine | Admitting: Emergency Medicine

## 2019-11-09 ENCOUNTER — Encounter (HOSPITAL_COMMUNITY): Payer: Self-pay | Admitting: Emergency Medicine

## 2019-11-09 DIAGNOSIS — F17290 Nicotine dependence, other tobacco product, uncomplicated: Secondary | ICD-10-CM | POA: Diagnosis not present

## 2019-11-09 DIAGNOSIS — Z79899 Other long term (current) drug therapy: Secondary | ICD-10-CM | POA: Diagnosis not present

## 2019-11-09 DIAGNOSIS — J45909 Unspecified asthma, uncomplicated: Secondary | ICD-10-CM | POA: Diagnosis not present

## 2019-11-09 DIAGNOSIS — M7731 Calcaneal spur, right foot: Secondary | ICD-10-CM | POA: Diagnosis not present

## 2019-11-09 DIAGNOSIS — M79671 Pain in right foot: Secondary | ICD-10-CM | POA: Diagnosis present

## 2019-11-09 MED ORDER — OXYCODONE HCL 10 MG PO TABS
ORAL_TABLET | ORAL | 0 refills | Status: DC
Start: 2019-11-09 — End: 2019-12-07

## 2019-11-09 MED ORDER — HYDROCODONE-ACETAMINOPHEN 5-325 MG PO TABS
1.0000 | ORAL_TABLET | Freq: Once | ORAL | Status: AC
Start: 2019-11-09 — End: 2019-11-09
  Administered 2019-11-09: 1 via ORAL
  Filled 2019-11-09: qty 1

## 2019-11-09 NOTE — Discharge Instructions (Signed)
You may take Tylenol and ibuprofen as needed for pain.  Do not take more than 4000 mg Tylenol or more than 2400 mg ibuprofen daily.  If you think you may be pregnant do not take ibuprofen.  I would suggest you keep your Ace wrap on your foot for compression.  You may also roll your foot on a tennis ball or on a frozen water bottle for pain control.  You may follow-up with orthopedics as needed.  Return to emergency department for any worsening symptoms.

## 2019-11-09 NOTE — ED Triage Notes (Signed)
Patient states she stepped down out of the bed last night and "felt a pop" in her heel. Sudden onset of pain, unable to bear weight on the heel. Patient states she has had to walk on her toes and it still causes pain in her heel.

## 2019-11-09 NOTE — Progress Notes (Signed)
Oxycodone filled at a reduced dosage of 5 mg q 8 h instead of 7.5 q  8.

## 2019-11-09 NOTE — ED Provider Notes (Signed)
[facility] EMERGENCY DEPARTMENT Provider Note   CSN: [id number] Arrival date & time: 11/09/19  1118    History   Chief Complaint Chief Complaint  Patient presents with   Foot Pain    HPI TRITIA ENDO is a 38 y.o. female with past medical history who presents for evaluation of foot pain.  She states she stepped out of bed last night and felt a pop sensation to her right heel.  Patient states the pain radiates into the plantar surface of her right foot as well as into the posterior aspect of her right heel.  Patient states she is unable to bear weight.  States that she holds her foot to a pointed position this helps her pain improved.  States when she dorsiflexes her foot she feels a "sharp sensation."  She denies hitting her head, LOC, any coagulation, decreased range of motion, numbness or tingling, redness, swelling, warmth, rashes or lesions to her extremities.  She does not take anything for pain.  She rates her pain a 10/10.  Patient denies any fall from heights, high velocity fall to her right calcaneus.  History obtained from patient and past medical records.  No interpreter is used.     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 02/02/2014   Had ascus with +HPV will get colpo   Abnormal Pap smear    Abnormal uterine bleeding (AUB) 10/11/2014   Anxiety    Asthma    Depression    Dysmenorrhea 10/11/2014   HSV-2 (herpes simplex virus 2) infection    Hx of migraines 02/09/2014   Has ?aura, will rx POP   Ilioinguinal neuralgia    left   Migraine    Other and unspecified ovarian cyst 02/06/2014   Ovarian cyst 10/18/2014   Panic attack    Thickened endometrium 10/18/2014   Unspecified symptom associated with female genital organs 01/27/2014   Vaginal Pap smear, abnormal    Vulvar abscess 03/09/2014   I&D 3/9 in ER at [facility] was rx'd bactrim ds x 10 days     Patient Active Problem List   Diagnosis Date Noted   Feeling grief Mother with Stage 4 cancer Dx 10/2018 12/01/2018   Morbid obesity ([place]) 06/01/2017   Entrapment, 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   Ilioinguinal neuralgia of left side 07/14/2016   Combined abdominal and pelvic pain 10/26/2015   Incisional pain s/p hysterectomy 06/26/2015   Knee pain, chronic 09/12/2014   Acute meniscal tear of knee 07/03/2014   Dysplasia of cervix, unspecified 02/16/2014   Hx of migraines 02/09/2014   Abnormal cervical Papanicolaou smear with positive human papilloma virus (HPV) DNA test 02/02/2014   Unspecified symptom associated with female genital organs 01/27/2014   HSV-2 (herpes simplex virus 2) infection 05/26/2013    Past Surgical History:  Procedure Laterality Date   ABDOMINAL HYSTERECTOMY     APPENDECTOMY     CESAREAN SECTION     CHOLECYSTECTOMY     DILATION AND CURETTAGE OF UTERUS     LAPAROSCOPIC SUPRACERVICAL HYSTERECTOMY N/A 01/02/2015   Procedure: ATTEMPTED LAPAROSCOPIC SUPRACERVICAL HYSTERECTOMY CONVERTED TO OPEN AT 4656;  Surgeon: Jonnie Kind, MD;  Location: [facility];  Service: Gynecology;  Laterality: N/A;   OVARIAN CYST REMOVAL     SCAR REVISION N/A 06/26/2015   Procedure: ABDOMINAL SCAR REVISION;  Surgeon: Jonnie Kind, MD;  Location: [facility];  Service: Gynecology;  Laterality: N/A;  SUPRACERVICAL ABDOMINAL HYSTERECTOMY N/A 01/02/2015   Procedure: HYSTERECTOMY SUPRACERVICAL ABDOMINAL;  Surgeon: Tilda Burrow, MD;  Location: [facility];  Service: Gynecology;  Laterality: N/A;     OB History    Gravida  7   Para  2   Term  1   Preterm  1   AB  5   Living  1     SAB  5   TAB      Ectopic      Multiple      Live Births  1            Home Medications    Prior to Admission medications   Medication Sig Start Date End Date Taking? Authorizing Provider  ALPRAZolam Prudy Feeler) 0.5 MG tablet Take 0.5 mg by mouth every 8 (eight) hours as  needed for anxiety.    [provider]  atorvastatin (LIPITOR) 20 MG tablet Take 20 mg by mouth daily.    [provider]  DULoxetine (CYMBALTA) 30 MG capsule Take 1 capsule (30 mg total) by mouth daily. 03/02/19   Tilda Burrow, MD  oxyCODONE (ROXICODONE) 15 MG immediate release tablet TAKE 1/2 TABLET BY MOUTH EVERY SIX HOURS AS NEEDED. 10/12/19   Tilda Burrow, MD  phentermine 37.5 MG capsule Take 1 capsule (37.5 mg total) by mouth every morning. 2 hours before breakfast with 8 ounces water 03/02/19   Tilda Burrow, MD  citalopram (CELEXA) 20 MG tablet Take 20 mg by mouth daily.    03/04/12  [provider]    Family History Family History  Problem Relation Age of Onset   Ulcers Father    Cancer Maternal Aunt        breast    Diabetes Maternal Grandmother    Diabetes Maternal Grandfather    Alzheimer's disease Paternal Grandmother    Diabetes Maternal Aunt    Cancer - Lung Mother        lump nodule   Cancer Mother        brain   Cancer Paternal Aunt        cervical    Social History Social History   Tobacco Use   Smoking status: Current Every Day Smoker    Packs/day: 0.00    Years: 12.00    Pack years: 0.00    Types: E-cigarettes   Smokeless tobacco: Never Used  Substance Use Topics   Alcohol use: No    Comment: socially   Drug use: No     Allergies   Coconut flavor   Review of Systems Review of Systems  Constitutional: Negative.   HENT: Negative.   Respiratory: Negative.   Cardiovascular: Negative.   Gastrointestinal: Negative.   Genitourinary: Negative.   Musculoskeletal: Negative for gait problem.       Right foot pain  Skin: Negative.   Neurological: Negative.   All other systems reviewed and are negative.    Physical Exam Updated Vital Signs BP (!) 113/59 (BP Location: Right Arm)    Pulse 64    Temp 98.4 F (36.9 C) (Oral)    Resp 17    Ht  (1.676 m)    Wt 134.7 kg    LMP  (LMP Unknown)    SpO2  99%    BMI 47.94 kg/m   Physical Exam Vitals signs and nursing note reviewed.  Constitutional:      General: She is not in acute distress.    Appearance: She is well-developed. She  is not ill-appearing or toxic-appearing.  HENT:     Head: Atraumatic.     Nose: Nose normal.     Mouth/Throat:     Mouth: Mucous membranes are moist.     Pharynx: Oropharynx is clear.  Eyes:     Pupils: Pupils are equal, round, and reactive to light.  Neck:     Musculoskeletal: Normal range of motion.  Cardiovascular:     Rate and Rhythm: Normal rate.     Pulses: Normal pulses.          Dorsalis pedis pulses are 2+ on the right side and 2+ on the left side.       Posterior tibial pulses are 2+ on the right side and 2+ on the left side.     Heart sounds: Normal heart sounds.  Pulmonary:     Effort: Pulmonary effort is normal. No respiratory distress.     Breath sounds: Normal breath sounds.  Abdominal:     General: Bowel sounds are normal. There is no distension.  Musculoskeletal: Normal range of motion.     Right ankle: Normal.     Left ankle: Normal.     Right foot: Normal capillary refill. Tenderness present. No bony tenderness, swelling, crepitus, deformity or laceration.     Left foot: Normal.       Feet:     Comments: Patient able to plantarflex, dorsiflex, invert and evert her bilateral feet.  Wiggles toes without difficulty.  Tenderness over her right posterior and inferior calcaneus without extension into the distal aspect of her Achilles tendon insertion.  She has no tenderness to bilateral calves.  Thompson test negative.  Compartments soft.  Feet:     Right foot:     Protective Sensation: 2 sites tested. 2 sites sensed.     Skin integrity: Skin integrity normal.     Left foot:     Protective Sensation: 2 sites tested. 2 sites sensed.     Skin integrity: Skin integrity normal.  Skin:    General: Skin is warm and dry.  Neurological:     Mental Status: She is alert.     Sensory:  Sensation is intact.     Comments: Walks with a limp secondary to right foot pain.     ED Treatments / Results  Labs (all labs ordered are listed, but only abnormal results are displayed) Labs Reviewed - No data to display  EKG None  Radiology Dg Foot Complete Right  Result Date: 11/09/2019 CLINICAL DATA:  [age] year old female with pain over the plantar aspect of the foot and calcaneus. EXAM: RIGHT FOOT COMPLETE - 3+ VIEW COMPARISON:  Right ankle radiograph dated 09/10/2015. FINDINGS: There is no acute fracture or dislocation. The bones are well mineralized. No arthritic changes. The ankle mortise is intact. There is a 4 mm plantar calcaneal spur. The soft tissues are unremarkable. No radiopaque foreign object or soft tissue gas. IMPRESSION: 1. No acute fracture or dislocation. 2. A 4 mm plantar calcaneal spur. Electronically Signed   By: Elgie CollardArash  Radparvar M.D.   On: 11/09/2019 13:42    Procedures Procedures (including critical care time)  Medications Ordered in ED Medications  HYDROcodone-acetaminophen (NORCO/VICODIN) 5-325 MG per tablet 1 tablet (1 tablet Oral Given 11/09/19 1340)   Initial Impression / Assessment and Plan / ED Course  I have reviewed the triage vital signs and the nursing notes.  Pertinent labs & imaging results that were available during my care of the patient were reviewed by  me and considered in my medical decision making (see chart for details).  38 year old presents for evaluation of right pain after stepping down last night. Afebrile, non septic, non ill appearing. Tenderness over inferior and posterior calcaneous. No extension into the distal aspect of the achilles tendon insertion, negative thompson text. No tenderness over navicular. Mild tenderness over the plantar tendon on right foot. Full ROM without difficulty. NV intact. Compartments soft. Plain film with heel spur. Will treat with splint, crutches, NSAIDs and ortho follow up. Low suspicion for septic  joint, gout, hemarthrosis, dislocation, DVT, compartment syndrome, myositis, acute bacterial infectious process. Given negative Thompson test with full ROM with plantarflexion and dorsiflexion I have low suspicion for achilles tendon rupture. Patient provided with splint and crutches. Discussed RICE for sx management. Patient to follow up with orthopedics as needed.  The patient has been appropriately medically screened and/or stabilized in the ED. I have low suspicion for any other emergent medical condition which would require further screening, evaluation or treatment in the ED or require inpatient management.  Patient is hemodynamically stable and in no acute distress.  Patient able to ambulate in department prior to ED.  Evaluation does not show acute pathology that would require ongoing or additional emergent interventions while in the emergency department or further inpatient treatment.  I have discussed the diagnosis with the patient and answered all questions.  Pain is been managed while in the emergency department and patient has no further complaints prior to discharge.  Patient is comfortable with plan discussed in room and is stable for discharge at this time.  I have discussed strict return precautions for returning to the emergency department.  Patient was encouraged to follow-up with PCP/specialist refer to at discharge.      Final Clinical Impressions(s) / ED Diagnoses   Final diagnoses:  Calcaneal spur of right foot    ED Discharge Orders    None       Rosaisela Jamroz A, PA-C 11/09/19 1357    Geoffery Lyons, MD 11/09/19 1430

## 2019-12-07 ENCOUNTER — Other Ambulatory Visit: Payer: Self-pay

## 2019-12-07 MED ORDER — OXYCODONE HCL 10 MG PO TABS
ORAL_TABLET | ORAL | 0 refills | Status: DC
Start: 2019-12-07 — End: 2020-01-12

## 2020-01-12 ENCOUNTER — Ambulatory Visit (INDEPENDENT_AMBULATORY_CARE_PROVIDER_SITE_OTHER): Payer: Medicaid Other | Admitting: Obstetrics and Gynecology

## 2020-01-12 ENCOUNTER — Other Ambulatory Visit: Payer: Self-pay

## 2020-01-12 ENCOUNTER — Encounter: Payer: Self-pay | Admitting: Obstetrics and Gynecology

## 2020-01-12 ENCOUNTER — Other Ambulatory Visit (HOSPITAL_COMMUNITY)
Admission: RE | Admit: 2020-01-12 | Discharge: 2020-01-12 | Disposition: A | Discharge status: Home / Self Care | Payer: Medicaid Other | Source: Ambulatory Visit | Attending: Obstetrics and Gynecology | Admitting: Obstetrics and Gynecology

## 2020-01-12 VITALS — Ht 66.0 in | Wt 301.0 lb

## 2020-01-12 DIAGNOSIS — Z01419 Encounter for gynecological examination (general) (routine) without abnormal findings: Secondary | ICD-10-CM | POA: Diagnosis present

## 2020-01-12 DIAGNOSIS — Z0000 Encounter for general adult medical examination without abnormal findings: Secondary | ICD-10-CM | POA: Diagnosis not present

## 2020-01-12 MED ORDER — PHENTERMINE HCL 37.5 MG PO CAPS: 37.5000 mg | ORAL_CAPSULE | ORAL | 1 refills | Status: DC

## 2020-01-12 MED ORDER — OXYCODONE HCL 10 MG PO TABS: ORAL_TABLET | ORAL | 0 refills | Status: DC

## 2020-01-12 NOTE — Progress Notes (Signed)
Patient ID: Barbara Jennings, female   DOB: [birth year], 39 y.o.   MRN: [id number]   Assessment:  Annual Gyn Exam Plan:  1. Pap smear done, next pap due 5 years 2. Return annually or prn 3    Annual mammogram advised after age 89 4.   Refill Phentermine and Oxycodone Subjective:  Barbara Jennings is a 39 y.o. female [phone] who presents for annual exam. No LMP recorded (lmp unknown). Patient has had a hysterectomy. The patient has no complaints today. She reports that her mental health has been better recently but that the holidays were difficult for her after her mother passed in March. She reports some pain in the center of her lower abdomen that worsens with movement. She has a TENS unit at home, but stopped using it because it doesn't help. The pt has not used Phentermine recently.   The following portions of the patient's history were reviewed and updated as appropriate: allergies, current medications, past family history, past medical history, past social history, past surgical history and problem list. Past Medical History:  Diagnosis Date  . 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 Abnormal Pap smear   . Abnormal uterine bleeding (AUB) 10/11/2014  . Anxiety   . Asthma   . Depression   . Dysmenorrhea 10/11/2014  . HSV-2 (herpes simplex virus 2) infection   . Hx of migraines 02/09/2014   Has ?aura, will rx POP  . Ilioinguinal neuralgia    left  . Migraine   . Other and unspecified ovarian cyst 02/06/2014  . Ovarian cyst 10/18/2014  . Panic attack   . Thickened endometrium 10/18/2014  . Unspecified symptom associated with female genital organs 01/27/2014  . Vaginal Pap smear, abnormal   . Vulvar abscess 03/09/2014   I&D 3/9 in ER at [facility] was rx'd bactrim ds x 10 days    Past Surgical History:  Procedure Laterality Date  . ABDOMINAL HYSTERECTOMY    . APPENDECTOMY    . CESAREAN  SECTION    . CHOLECYSTECTOMY    . DILATION AND CURETTAGE OF UTERUS    . LAPAROSCOPIC SUPRACERVICAL HYSTERECTOMY N/A 01/02/2015   Procedure: ATTEMPTED LAPAROSCOPIC SUPRACERVICAL HYSTERECTOMY CONVERTED TO OPEN AT 0932;  Surgeon: Tilda Burrow, MD;  Location: [facility];  Service: Gynecology;  Laterality: N/A;  . OVARIAN CYST REMOVAL    . SCAR REVISION N/A 06/26/2015   Procedure: ABDOMINAL SCAR REVISION;  Surgeon: Tilda Burrow, MD;  Location: [facility];  Service: Gynecology;  Laterality: N/A;  . SUPRACERVICAL ABDOMINAL HYSTERECTOMY N/A 01/02/2015   Procedure: HYSTERECTOMY SUPRACERVICAL ABDOMINAL;  Surgeon: Tilda Burrow, MD;  Location: [facility];  Service: Gynecology;  Laterality: N/A;     Current Outpatient Medications:  .  ALPRAZolam (XANAX) 0.5 MG tablet, Take 0.5 mg by mouth every 8 (eight) hours as needed for anxiety., Disp: , Rfl:  .  atorvastatin (LIPITOR) 20 MG tablet, Take 20 mg by mouth daily., Disp: , Rfl:  .  DULoxetine (CYMBALTA) 30 MG capsule, Take 1 capsule (30 mg total) by mouth daily., Disp: 30 capsule, Rfl: 3 .  Oxycodone HCl 10 MG TABS, TAKE 1/2 TABLET BY MOUTH EVERY SIX HOURS AS NEEDED., Disp: 60 tablet, Rfl: 0 .  phentermine 37.5 MG capsule, Take 1 capsule (37.5 mg total) by mouth every morning. 2 hours before breakfast with 8 ounces water, Disp: 30 capsule, Rfl: 0  Review of  Systems Constitutional: negative Gastrointestinal: negative Genitourinary: negative  Objective:  LMP  (LMP Unknown)    BMI: There is no height or weight on file to calculate BMI.  General Appearance: Alert, appropriate appearance for age. No acute distress HEENT: Grossly normal Neck / Thyroid:  Cardiovascular: RRR; normal S1, S2, no murmur Lungs: CTA bilaterally Back: No CVAT Breast Exam: deferred Gastrointestinal: Soft, non-tender, no masses or organomegaly Pelvic Exam:  Cervix: mobile Vagina: discomfort with palpation at the vaginal entrance Adnexa: pain with palpation in center of  abdomen Rectovaginal: not done Lymphatic Exam: Non-palpable nodes in neck, clavicular, axillary, or inguinal regions  Skin: no rash or abnormalities Neurologic: Normal gait and speech, no tremor  Psychiatric: Alert and oriented, appropriate affect.  Urinalysis:Not done  Mallory Shirk. MD Pgr [phone] 9:23 AM  By signing my name below, I, De Burrs, attest that this documentation has been prepared under the direction and in the presence of Jonnie Kind, MD. Electronically Signed: De Burrs, Medical Scribe. 01/12/20. 9:23 AM.  I personally performed the services described in this documentation, which was SCRIBED in my presence. The recorded information has been reviewed and considered accurate. It has been edited as necessary during review. Jonnie Kind, MD

## 2020-01-18 LAB — CYTOLOGY - PAP
Chlamydia: NEGATIVE
Comment: NEGATIVE
Comment: NEGATIVE
Comment: NEGATIVE
Comment: NORMAL
HPV 16: POSITIVE — AB
HPV 18 / 45: NEGATIVE
High risk HPV: POSITIVE — AB
Neisseria Gonorrhea: NEGATIVE

## 2020-01-23 ENCOUNTER — Encounter: Payer: Self-pay | Admitting: *Deleted

## 2020-01-23 ENCOUNTER — Encounter: Payer: Self-pay | Admitting: Obstetrics and Gynecology

## 2020-02-08 ENCOUNTER — Other Ambulatory Visit: Payer: Self-pay

## 2020-02-08 ENCOUNTER — Ambulatory Visit: Payer: Medicaid Other | Admitting: Obstetrics and Gynecology

## 2020-02-08 ENCOUNTER — Other Ambulatory Visit (HOSPITAL_COMMUNITY)
Admission: RE | Admit: 2020-02-08 | Discharge: 2020-02-08 | Disposition: A | Discharge status: Home / Self Care | Payer: Medicaid Other | Source: Ambulatory Visit | Attending: Obstetrics and Gynecology | Admitting: Obstetrics and Gynecology

## 2020-02-08 ENCOUNTER — Encounter: Payer: Self-pay | Admitting: Obstetrics and Gynecology

## 2020-02-08 VITALS — BP 139/90 | HR 86 | Ht 66.0 in | Wt 298.6 lb

## 2020-02-08 DIAGNOSIS — R87612 Low grade squamous intraepithelial lesion on cytologic smear of cervix (LGSIL): Secondary | ICD-10-CM

## 2020-02-08 DIAGNOSIS — Z3202 Encounter for pregnancy test, result negative: Secondary | ICD-10-CM

## 2020-02-08 MED ORDER — OXYCODONE HCL 10 MG PO TABS: ORAL_TABLET | ORAL | 0 refills | Status: DC

## 2020-02-08 NOTE — Progress Notes (Signed)
Patient ID: Barbara Jennings, female   DOB: 28-Oct-1981, 39 y.o.   MRN: [id number]   [name] 39 y.o. [id number] here for colposcopy for low-grade squamous intraepithelial neoplasia (LGSIL - encompassing HPV,mild dysplasia,CIN I) with some areas of high grade + HRHPV (HPV 16) pap smear on 01/12/2020.  Discussed role for HPV in cervical dysplasia, need for surveillance.  Patient given informed consent, signed copy in the chart, time out was performed.  Placed in lithotomy position. Cervix viewed with speculum and colposcope after application of acetic acid. Cervix sprayed with 1% lidocaine x 2 times. Patient cryin and and having abdominal pain 2-3 cm above her lower abdominal traverse incision . Biopsies not able to be obtained. ECC done superficially. (s/p supracervical hyst)  Pt became progressively more anxious and more complaining of pain as the colposcopy progressed, then tolerated single finger digital exam w/o as much concern, the cervix is mobile and does not reproduce the suprapubic pains.  Colposcopy adequate? No biopsies not obtained at due to pain.  ECC specimen obtained. All specimens were labelled and sent to pathology.   Colposcopy IMPRESSION: 1. Cin I of anterior cervix 10 ocllock.    By signing my name below, I, Arnette Norris, attest that this documentation has been prepared under the direction and in the presence of Tilda Burrow, MD. Electronically Signed: Arnette Norris Medical Scribe. 02/08/20. 10:15 AM.  I personally performed the services described in this documentation, which was SCRIBED in my presence. The recorded information has been reviewed and considered accurate. It has been edited as necessary during review. Tilda Burrow, MD

## 2020-02-09 ENCOUNTER — Other Ambulatory Visit: Payer: Self-pay | Admitting: Obstetrics and Gynecology

## 2020-02-09 DIAGNOSIS — Z3202 Encounter for pregnancy test, result negative: Secondary | ICD-10-CM | POA: Diagnosis not present

## 2020-02-10 LAB — SURGICAL PATHOLOGY

## 2020-03-07 ENCOUNTER — Other Ambulatory Visit: Payer: Self-pay | Admitting: Obstetrics and Gynecology

## 2020-03-07 MED ORDER — OXYCODONE HCL 10 MG PO TABS: ORAL_TABLET | ORAL | 0 refills | Status: DC

## 2020-03-07 NOTE — Progress Notes (Signed)
Monthly rx for oxycodone for ilioinguinal neuralgia sent in.

## 2020-03-09 ENCOUNTER — Ambulatory Visit: Payer: Medicaid Other | Admitting: Obstetrics and Gynecology

## 2020-04-02 ENCOUNTER — Encounter: Payer: Self-pay | Admitting: Obstetrics and Gynecology

## 2020-04-02 ENCOUNTER — Ambulatory Visit (INDEPENDENT_AMBULATORY_CARE_PROVIDER_SITE_OTHER): Payer: Medicaid Other | Admitting: Obstetrics and Gynecology

## 2020-04-02 ENCOUNTER — Other Ambulatory Visit: Payer: Self-pay

## 2020-04-02 VITALS — BP 115/53 | HR 93 | Ht 66.0 in | Wt 296.4 lb

## 2020-04-02 DIAGNOSIS — Z6841 Body Mass Index (BMI) 40.0 and over, adult: Secondary | ICD-10-CM

## 2020-04-02 DIAGNOSIS — G5780 Other specified mononeuropathies of unspecified lower limb: Secondary | ICD-10-CM

## 2020-04-02 DIAGNOSIS — G5792 Unspecified mononeuropathy of left lower limb: Secondary | ICD-10-CM

## 2020-04-02 MED ORDER — OXYCODONE HCL 10 MG PO TABS: ORAL_TABLET | ORAL | 0 refills | Status: DC

## 2020-04-02 NOTE — Progress Notes (Signed)
Patient ID: [name], female   DOB: [birth year], 39 y.o.   MRN: [id number]    [facility] ObGyn Clinic Visit  04/02/20             Patient name: Barbara Jennings MRN [id number]  Date of birth: 03/15/1981  CC & HPI:  Barbara Jennings is a 39 y.o. female presenting today for chronic lower abdominal pain felt to be due to ileal inguinal neuralgia, currently exhibited as right lower quadrant pain management. She is currently experiencing some fatigue due to lack of sleep. Her pain is currently on her right side.  Interestingly she is complained of pain intermittently on the left side than the right side which makes the diagnosis hard to comprehend She was placed on Gabapentin, without success.  Twice she has been referred to GYN and chronic pain clinics both at [facility], and [place].  At this time, she is on Ocycodone HCl 10mg  TABS x 60 she is taking 1/2 tablet every 6 hours to 8 hours for pain management.   For physical activity, she walks. She notes that she generally walks 1 hour per day for 4 days per week, approximately 1 mile. She is considering joining as she has been unable to get financial aid to join the [employer]; [employer] is also open all the time so she would be able to go when her children are asleep and her husband could watch them.    ROS:  Review of Systems  Gastrointestinal: Positive for abdominal pain.  Weight remains a chronic issue just under 300 pounds at present.  She is not currently on phentermine though that has helped her suppress eating at times   Pertinent History Reviewed:    Medical         Past Medical History:  Diagnosis Date  . 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 Abnormal Pap smear   . Abnormal uterine bleeding (AUB) 10/11/2014  . Anxiety   . Asthma   . Depression   . Dysmenorrhea 10/11/2014  . HSV-2 (herpes simplex virus 2) infection   . Hx  of migraines 02/09/2014   Has ?aura, will rx POP  . Ilioinguinal neuralgia    left  . Migraine   . Other and unspecified ovarian cyst 02/06/2014  . Ovarian cyst 10/18/2014  . Panic attack   . Thickened endometrium 10/18/2014  . Unspecified symptom associated with female genital organs 01/27/2014  . Vaginal Pap smear, abnormal   . Vulvar abscess 03/09/2014   I&D 3/9 in ER at [facility] was rx'd bactrim ds x 10 days                              Surgical Hx:    Past Surgical History:  Procedure Laterality Date  . ABDOMINAL HYSTERECTOMY    . APPENDECTOMY    . CESAREAN SECTION    . CHOLECYSTECTOMY    . DILATION AND CURETTAGE OF UTERUS    . LAPAROSCOPIC SUPRACERVICAL HYSTERECTOMY N/A 01/02/2015   Procedure: ATTEMPTED LAPAROSCOPIC SUPRACERVICAL HYSTERECTOMY CONVERTED TO OPEN AT 03/03/2015;  Surgeon: [id number], MD;  Location: [facility];  Service: Gynecology;  Laterality: N/A;  . OVARIAN CYST REMOVAL    . SCAR REVISION N/A 06/26/2015   Procedure: ABDOMINAL SCAR REVISION;  Surgeon: 06/28/2015, MD;  Location: [facility];  Service: Gynecology;  Laterality: N/A;  . SUPRACERVICAL ABDOMINAL HYSTERECTOMY N/A 01/02/2015   Procedure: HYSTERECTOMY SUPRACERVICAL ABDOMINAL;  Surgeon: Jonnie Kind, MD;  Location: [facility];  Service: Gynecology;  Laterality: N/A;   Medications: Reviewed & Updated - see associated section                       Current Outpatient Medications:  .  ALPRAZolam (XANAX) 0.5 MG tablet, Take 0.5 mg by mouth every 8 (eight) hours as needed for anxiety., Disp: , Rfl:  .  butalbital-acetaminophen-caffeine (FIORICET) 50-325-40 MG tablet, Take 1 tablet by mouth 4 (four) times daily as needed., Disp: , Rfl:  .  DULoxetine (CYMBALTA) 30 MG capsule, Take 1 capsule (30 mg total) by mouth daily., Disp: 30 capsule, Rfl: 3 .  Oxycodone HCl 10 MG TABS, TAKE 1/2 TABLET BY MOUTH EVERY SIX HOURS AS NEEDED., Disp: 60 tablet, Rfl: 0 .  phentermine 37.5 MG capsule, Take 1 capsule (37.5 mg total) by mouth  every morning. 2 hours before breakfast with 8 ounces water, Disp: 30 capsule, Rfl: 1   Social History: Reviewed -  reports that she has been smoking e-cigarettes. She has been smoking about 0.00 packs per day for the past 12.00 years. She has never used smokeless tobacco.  Objective Findings:  Vitals: There were no vitals taken for this visit. BP (!) 115/53 (BP Location: Right Arm, Patient Position: Sitting, Cuff Size: Normal)   Pulse 93   Ht 5\' 6"  (1.676 m)   Wt 296 lb 6.4 oz (134.4 kg)   LMP  (LMP Unknown)   BMI 47.84 kg/m   PHYSICAL EXAMINATION General appearance - alert, well appearing, and in no distress, oriented to person, place, and time and overweight Mental status - alert, oriented to person, place, and time, normal mood, behavior, speech, dress, motor activity, and thought processes, affect appropriate to mood Chest - not examined Heart - not examined Abdomen - not examined Breasts - not examined Skin - normal coloration and turgor, no rashes, no suspicious skin lesions noted   Assessment & Plan:   A:  1.  Discussed healthy food options and weight management  P:  1.  Referral to pain clinic for transfer of care by October as I will no longer be available for chronic pain management for her    By signing my name below, I, [employer], attest that this documentation has been prepared under the direction and in the presence of Jonnie Kind, MD. Electronically Signed: Garrett. 04/02/20. 8:31 AM.  I personally performed the services described in this documentation, which was SCRIBED in my presence. The recorded information has been reviewed and considered accurate. It has been edited as necessary during review. Jonnie Kind, MD

## 2020-04-30 ENCOUNTER — Other Ambulatory Visit: Payer: Self-pay

## 2020-05-01 MED ORDER — OXYCODONE HCL 10 MG PO TABS: ORAL_TABLET | ORAL | 0 refills | Status: DC

## 2020-05-30 ENCOUNTER — Other Ambulatory Visit: Payer: Self-pay | Admitting: Obstetrics and Gynecology

## 2020-05-30 MED ORDER — OXYCODONE HCL 10 MG PO TABS: ORAL_TABLET | ORAL | 0 refills | Status: DC

## 2020-05-30 NOTE — Progress Notes (Signed)
Monthly refil of oxycodone 5 mg q 6h refilled.

## 2020-06-01 ENCOUNTER — Ambulatory Visit: Payer: Medicaid Other | Admitting: Obstetrics and Gynecology

## 2020-06-27 NOTE — Progress Notes (Signed)
PATIENT ID: Barbara Jennings, female     DOB: March 18, 1981, 39 y.o.     MRN: [id number]   [facility] ObGyn Clinic Visit  06/27/20     PATIENT NAME: Barbara Jennings     MRN [id number]     DOB: 1981-08-10  CC & HPI:  No chief complaint on file.  Barbara Jennings is a 39 y.o. 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  The pain is become more nonspecific.  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  She is not lost any weight.  There that is been a focus of effort many times unsuccessfully.  The patient is made aware today that as I retire in October she will need to begin going to a pain clinic as we will not continue to prescribe her opiates through this clinic  ROS:  Review of Systems  Constitutional: Negative.   HENT: Negative.   Eyes: Negative.   Respiratory: Negative.   Cardiovascular: Negative.   Gastrointestinal: Negative.   Genitourinary: Negative.   Musculoskeletal: Negative.   Skin: Negative.   Neurological: Negative.   Endo/Heme/Allergies: Negative.   Psychiatric/Behavioral: Negative.   All other systems reviewed and are negative.   Pertinent History Reviewed:  Reviewed: Significant for depression, grief response to loss of family members including mother and father, ileal inguinal neuralgia.  Efforts to refer her to pain clinics or to GYN specialty clinics at [facility] have not been successful at  finding solutions for her pain The plan was initially to refer her for exploratory surgery to try to address the neuropathic pain but her symptoms of been to nonspecific and nonfocal for [place] to consider there was any real success particular when considered with her obesity I once tried exploration in the left side of the incision to try to find an area of scar tissue and could not identify 1.  The pain did not seem to be improved The  patient is currently clinically and chronically depressed, not suicidal but defeated.  She has a fianc and children that she considers her support group.  She is not doing anything to improve her physical activity.  She is surprised that she is now 313 pounds  Medical         Past Medical History:  Diagnosis Date  . 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 Abnormal Pap smear   . Abnormal uterine bleeding (AUB) 10/11/2014  . Anxiety   . Asthma   . Depression   . Dysmenorrhea 10/11/2014  . HSV-2 (herpes simplex virus 2) infection   . Hx of migraines 02/09/2014   Has ?aura, will rx POP  . Ilioinguinal neuralgia    left  . Migraine   . Other and unspecified ovarian cyst 02/06/2014  . Ovarian cyst 10/18/2014  . Panic attack   . Thickened endometrium 10/18/2014  . Unspecified symptom associated with female genital organs 01/27/2014  . Vaginal Pap smear, abnormal   . Vulvar abscess 03/09/2014   I&D 3/9 in ER at [facility] was rx'd bactrim ds x 10 days                              Surgical Hx:  Past Surgical History:  Procedure Laterality Date  . ABDOMINAL HYSTERECTOMY    . APPENDECTOMY    . CESAREAN SECTION    . CHOLECYSTECTOMY    . DILATION AND CURETTAGE OF UTERUS    . LAPAROSCOPIC SUPRACERVICAL HYSTERECTOMY N/A 01/02/2015   Procedure: ATTEMPTED LAPAROSCOPIC SUPRACERVICAL HYSTERECTOMY CONVERTED TO OPEN AT 0350;  Surgeon: Tilda Burrow, MD;  Location: [facility];  Service: Gynecology;  Laterality: N/A;  . OVARIAN CYST REMOVAL    . SCAR REVISION N/A 06/26/2015   Procedure: ABDOMINAL SCAR REVISION;  Surgeon: Tilda Burrow, MD;  Location: [facility];  Service: Gynecology;  Laterality: N/A;  . SUPRACERVICAL ABDOMINAL HYSTERECTOMY N/A 01/02/2015   Procedure: HYSTERECTOMY SUPRACERVICAL ABDOMINAL;  Surgeon: Tilda Burrow, MD;  Location: [facility];  Service: Gynecology;  Laterality: N/A;   Medications:  Reviewed & Updated - see associated section                       Current Outpatient Medications:  .  ALPRAZolam (XANAX) 0.5 MG tablet, Take 0.5 mg by mouth every 8 (eight) hours as needed for anxiety., Disp: , Rfl:  .  butalbital-acetaminophen-caffeine (FIORICET) 50-325-40 MG tablet, Take 1 tablet by mouth 4 (four) times daily as needed., Disp: , Rfl:  .  DULoxetine (CYMBALTA) 30 MG capsule, Take 1 capsule (30 mg total) by mouth daily., Disp: 30 capsule, Rfl: 3 .  furosemide (LASIX) 20 MG tablet, Take 20 mg by mouth 2 (two) times daily., Disp: , Rfl:  .  Oxycodone HCl 10 MG TABS, TAKE 1/2 TABLET BY MOUTH EVERY SIX HOURS AS NEEDED., Disp: 60 tablet, Rfl: 0 .  phentermine 37.5 MG capsule, Take 1 capsule (37.5 mg total) by mouth every morning. 2 hours before breakfast with 8 ounces water, Disp: 30 capsule, Rfl: 1   Social History: Reviewed -  reports that she has been smoking e-cigarettes. She has been smoking about 0.00 packs per day for the past 12.00 years. She has never used smokeless tobacco.  Objective Findings:  Vitals: There were no vitals taken for this visit.  PHYSICAL EXAMINATION General appearance - alert, well appearing, and in no distress, oriented to person, place, and time, overweight, chronically ill appearing and crying Mental status - alert, oriented to person, place, and time, normal mood, behavior, speech, dress, motor activity, and thought processes Chest -  Heart -  Abdomen - soft, nontender, nondistended, no masses or organomegaly Marked obesity Breasts -  Skin -   PELVIC External genitalia -  Vulva -  Vagina -  Cervix -   Uterus -   Adnexa -  [place] -  Rectal -   Assessment & Plan:   A:  1. 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 Chronic depression 3. Obesity without motivation to change  P:  1. Renew oxycodone.  Patient is aware that she will need to go to another provider such as a pain clinic.  She is [facility] to  [facility] as a possible source of future care   By signing my name below, I, [employer], attest that this documentation has been prepared under the direction and in the presence of Tilda Burrow, MD. Electronically Signed: Mal Misty Medical Scribe. 06/27/20. 2:41 PM.  I personally performed the services described in this documentation, which was SCRIBED in my presence. The recorded information has been reviewed and considered accurate. It has been edited as necessary during review. Tilda Burrow, MD

## 2020-06-28 ENCOUNTER — Encounter: Payer: Self-pay | Admitting: Obstetrics and Gynecology

## 2020-06-28 ENCOUNTER — Ambulatory Visit: Payer: Medicaid Other | Admitting: Obstetrics and Gynecology

## 2020-06-28 VITALS — BP 122/75 | HR 82 | Ht 66.0 in | Wt 313.0 lb

## 2020-06-28 DIAGNOSIS — F329 Major depressive disorder, single episode, unspecified: Secondary | ICD-10-CM

## 2020-06-28 DIAGNOSIS — E669 Obesity, unspecified: Secondary | ICD-10-CM

## 2020-06-28 DIAGNOSIS — R109 Unspecified abdominal pain: Secondary | ICD-10-CM

## 2020-06-28 DIAGNOSIS — G5781 Other specified mononeuropathies of right lower limb: Secondary | ICD-10-CM

## 2020-06-28 NOTE — Patient Instructions (Signed)
Calorie Counting for Weight Loss Calories are units of energy. Your body needs a certain amount of calories from food to keep you going throughout the day. When you eat more calories than your body needs, your body stores the extra calories as fat. When you eat fewer calories than your body needs, your body burns fat to get the energy it needs. Calorie counting means keeping track of how many calories you eat and drink each day. Calorie counting can be helpful if you need to lose weight. If you make sure to eat fewer calories than your body needs, you should lose weight. Ask your health care provider what a healthy weight is for you. For calorie counting to work, you will need to eat the right number of calories in a day in order to lose a healthy amount of weight per week. A dietitian can help you determine how many calories you need in a day and will give you suggestions on how to reach your calorie goal.  A healthy amount of weight to lose per week is usually 1-2 lb (0.5-0.9 kg). This usually means that your daily calorie intake should be reduced by 500-750 calories.  Eating 1,200 - 1,500 calories per day can help most women lose weight.  Eating 1,500 - 1,800 calories per day can help most men lose weight. What is my plan? My goal is to have __________ calories per day. If I have this many calories per day, I should lose around __________ pounds per week. What do I need to know about calorie counting? In order to meet your daily calorie goal, you will need to:  Find out how many calories are in each food you would like to eat. Try to do this before you eat.  Decide how much of the food you plan to eat.  Write down what you ate and how many calories it had. Doing this is called keeping a food log. To successfully lose weight, it is important to balance calorie counting with a healthy lifestyle that includes regular activity. Aim for 150 minutes of moderate exercise (such as walking) or 75  minutes of vigorous exercise (such as running) each week. Where do I find calorie information?  The number of calories in a food can be found on a Nutrition Facts label. If a food does not have a Nutrition Facts label, try to look up the calories online or ask your dietitian for help. Remember that calories are listed per serving. If you choose to have more than one serving of a food, you will have to multiply the calories per serving by the amount of servings you plan to eat. For example, the label on a package of bread might say that a serving size is 1 slice and that there are 90 calories in a serving. If you eat 1 slice, you will have eaten 90 calories. If you eat 2 slices, you will have eaten 180 calories. How do I keep a food log? Immediately after each meal, record the following information in your food log:  What you ate. Don't forget to include toppings, sauces, and other extras on the food.  How much you ate. This can be measured in cups, ounces, or number of items.  How many calories each food and drink had.  The total number of calories in the meal. Keep your food log near you, such as in a small notebook in your pocket, or use a mobile app or website. Some programs will calculate   calories for you and show you how many calories you have left for the day to meet your goal. What are some calorie counting tips?  1. Use your calories on foods and drinks that will fill you up and not leave you hungry: ? Some examples of foods that fill you up are nuts and nut butters, vegetables, lean proteins, and high-fiber foods like whole grains. High-fiber foods are foods with more than 5 g fiber per serving. ? Drinks such as sodas, specialty coffee drinks, alcohol, and juices have a lot of calories, yet do not fill you up. 2. Eat nutritious foods and avoid empty calories. Empty calories are calories you get from foods or beverages that do not have many vitamins or protein, such as candy, sweets, and  soda. It is better to have a nutritious high-calorie food (such as an avocado) than a food with few nutrients (such as a bag of chips). 3. Know how many calories are in the foods you eat most often. This will help you calculate calorie counts faster. 4. Pay attention to calories in drinks. Low-calorie drinks include water and unsweetened drinks. 5. Pay attention to nutrition labels for "low fat" or "fat free" foods. These foods sometimes have the same amount of calories or more calories than the full fat versions. They also often have added sugar, starch, or salt, to make up for flavor that was removed with the fat. 6. Find a way of tracking calories that works for you. Get creative. Try different apps or programs if writing down calories does not work for you. What are some portion control tips?  Know how many calories are in a serving. This will help you know how many servings of a certain food you can have.  Use a measuring cup to measure serving sizes. You could also try weighing out portions on a kitchen scale. With time, you will be able to estimate serving sizes for some foods.  Take some time to put servings of different foods on your favorite plates, bowls, and cups so you know what a serving looks like.  Try not to eat straight from a bag or box. Doing this can lead to overeating. Put the amount you would like to eat in a cup or on a plate to make sure you are eating the right portion.  Use smaller plates, glasses, and bowls to prevent overeating.  Try not to multitask (for example, watch TV or use your computer) while eating. If it is time to eat, sit down at a table and enjoy your food. This will help you to know when you are full. It will also help you to be aware of what you are eating and how much you are eating. What are tips for following this plan? Reading food labels  Check the calorie count compared to the serving size. The serving size may be smaller than what you are used  to eating.  Check the source of the calories. Make sure the food you are eating is high in vitamins and protein and low in saturated and trans fats. Shopping  Read nutrition labels while you shop. This will help you make healthy decisions before you decide to purchase your food.  Make a grocery list and stick to it. Cooking  Try to cook your favorite foods in a healthier way. For example, try baking instead of frying.  Use low-fat dairy products. Meal planning  Use more fruits and vegetables. Half of your plate should be fruits   and vegetables.  Include lean proteins like poultry and fish. How do I count calories when eating out? 1. Ask for smaller portion sizes. 2. Consider sharing an entree and sides instead of getting your own entree. 3. If you get your own entree, eat only half. Ask for a box at the beginning of your meal and put the rest of your entree in it so you are not tempted to eat it. 4. If calories are listed on the menu, choose the lower calorie options. 5. Choose dishes that include vegetables, fruits, whole grains, low-fat dairy products, and lean protein. 6. Choose items that are boiled, broiled, grilled, or steamed. Stay away from items that are buttered, battered, fried, or served with cream sauce. Items labeled "crispy" are usually fried, unless stated otherwise. 7. Choose water, low-fat milk, unsweetened iced tea, or other drinks without added sugar. If you want an alcoholic beverage, choose a lower calorie option such as a glass of wine or light beer. 8. Ask for dressings, sauces, and syrups on the side. These are usually high in calories, so you should limit the amount you eat. 9. If you want a salad, choose a garden salad and ask for grilled meats. Avoid extra toppings like bacon, cheese, or fried items. Ask for the dressing on the side, or ask for olive oil and vinegar or lemon to use as dressing. 10. Estimate how many servings of a food you are given. For example,  a serving of cooked rice is  cup or about the size of half a baseball. Knowing serving sizes will help you be aware of how much food you are eating at restaurants. The list below tells you how big or small some common portion sizes are based on everyday objects: ? 1 oz--4 stacked dice. ? 3 oz--1 deck of cards. ? 1 tsp--1 die. ? 1 Tbsp-- a ping-pong ball. ? 2 Tbsp--1 ping-pong ball. ?  cup-- baseball. ? 1 cup--1 baseball. Summary  Calorie counting means keeping track of how many calories you eat and drink each day. If you eat fewer calories than your body needs, you should lose weight.  A healthy amount of weight to lose per week is usually 1-2 lb (0.5-0.9 kg). This usually means reducing your daily calorie intake by 500-750 calories.  The number of calories in a food can be found on a Nutrition Facts label. If a food does not have a Nutrition Facts label, try to look up the calories online or ask your dietitian for help.  Use your calories on foods and drinks that will fill you up, and not on foods and drinks that will leave you hungry.  Use smaller plates, glasses, and bowls to prevent overeating. This information is not intended to replace advice given to you by your health care provider. Make sure you discuss any questions you have with your health care provider.  Document Revised: 09/03/2018 Document Reviewed: 11/14/2016 Elsevier Patient Education  2020 [employer]  ---------------------------------------------------------------------------------------------------------------------  Exercising to Lose Weight Exercise is structured, repetitive physical activity to improve fitness and health. Getting regular exercise is important for everyone. It is especially important if you are overweight. Being overweight increases your risk of heart disease, stroke, diabetes, high blood pressure, and several types of cancer. Reducing your calorie intake and exercising can help you lose  weight. Exercise is usually categorized as moderate or vigorous intensity. To lose weight, most people need to do a certain amount of moderate-intensity or vigorous-intensity exercise each week. Moderate-intensity   exercise  Moderate-intensity exercise is any activity that gets you moving enough to burn at least three times more energy (calories) than if you were sitting. Examples of moderate exercise include:  Walking a mile in 15 minutes.  Doing light yard work.  Biking at an easy pace. Most people should get at least 150 minutes (2 hours and 30 minutes) a week of moderate-intensity exercise to maintain their body weight. Vigorous-intensity exercise Vigorous-intensity exercise is any activity that gets you moving enough to burn at least six times more calories than if you were sitting. When you exercise at this intensity, you should be working hard enough that you are not able to carry on a conversation. Examples of vigorous exercise include:  Running.  Playing a team sport, such as football, basketball, and soccer.  Jumping rope. Most people should get at least 75 minutes (1 hour and 15 minutes) a week of vigorous-intensity exercise to maintain their body weight. How can exercise affect me? When you exercise enough to burn more calories than you eat, you lose weight. Exercise also reduces body fat and builds muscle. The more muscle you have, the more calories you burn. Exercise also:  Improves mood.  Reduces stress and tension.  Improves your overall fitness, flexibility, and endurance.  Increases bone strength. The amount of exercise you need to lose weight depends on:  Your age.  The type of exercise.  Any health conditions you have.  Your overall physical ability. Talk to your health care provider about how much exercise you need and what types of activities are safe for you. What actions can I take to lose weight? Nutrition  7. Make changes to your diet as told by  your health care provider or diet and nutrition specialist (dietitian). This may include: ? Eating fewer calories. ? Eating more protein. ? Eating less unhealthy fats. ? Eating a diet that includes fresh fruits and vegetables, whole grains, low-fat dairy products, and lean protein. ? Avoiding foods with added fat, salt, and sugar. 8. Drink plenty of water while you exercise to prevent dehydration or heat stroke. Activity 11. Choose an activity that you enjoy and set realistic goals. Your health care provider can help you make an exercise plan that works for you. 12. Exercise at a moderate or vigorous intensity most days of the week. ? The intensity of exercise may vary from person to person. You can tell how intense a workout is for you by paying attention to your breathing and heartbeat. Most people will notice their breathing and heartbeat get faster with more intense exercise. 13. Do resistance training twice each week, such as: ? Push-ups. ? Sit-ups. ? Lifting weights. ? Using resistance bands. 14. Getting short amounts of exercise can be just as helpful as long structured periods of exercise. If you have trouble finding time to exercise, try to include exercise in your daily routine. ? Get up, stretch, and walk around every 30 minutes throughout the day. ? Go for a walk during your lunch break. ? Park your car farther away from your destination. ? If you take public transportation, get off one stop early and walk the rest of the way. ? Make phone calls while standing up and walking around. ? Take the stairs instead of elevators or escalators. 15. Wear comfortable clothes and shoes with good support. 16. Do not exercise so much that you hurt yourself, feel dizzy, or get very short of breath. Where to find more information  [place] Department of   Health and Human Services: www.hhs.gov  Centers for Disease Control and Prevention (CDC): www.cdc.gov Contact a health care provider:  Before  starting a new exercise program.  If you have questions or concerns about your weight.  If you have a medical problem that keeps you from exercising. Get help right away if you have any of the following while exercising:  Injury.  Dizziness.  Difficulty breathing or shortness of breath that does not go away when you stop exercising.  Chest pain.  Rapid heartbeat. Summary  Being overweight increases your risk of heart disease, stroke, diabetes, high blood pressure, and several types of cancer.  Losing weight happens when you burn more calories than you eat.  Reducing the amount of calories you eat in addition to getting regular moderate or vigorous exercise each week helps you lose weight. This information is not intended to replace advice given to you by your health care provider. Make sure you discuss any questions you have with your health care provider.  Document Revised: 12/28/2017 Document Reviewed: 12/28/2017 Elsevier Patient Education  2020 Elsevier Inc.   

## 2020-06-29 ENCOUNTER — Other Ambulatory Visit: Payer: Self-pay | Admitting: Obstetrics and Gynecology

## 2020-06-29 ENCOUNTER — Other Ambulatory Visit: Payer: Self-pay

## 2020-07-03 NOTE — Telephone Encounter (Signed)
Prescription was filled and patient aware.

## 2020-07-24 ENCOUNTER — Telehealth: Payer: Self-pay | Admitting: *Deleted

## 2020-07-24 ENCOUNTER — Other Ambulatory Visit: Payer: Self-pay

## 2020-07-24 NOTE — Telephone Encounter (Signed)
Pt is requesting a refill on Oxycodone. Thanks!! JSY

## 2020-07-26 ENCOUNTER — Other Ambulatory Visit: Payer: Self-pay | Admitting: Obstetrics and Gynecology

## 2020-07-26 MED ORDER — OXYCODONE HCL 10 MG PO TABS: ORAL_TABLET | ORAL | 0 refills | Status: DC

## 2020-07-26 NOTE — Progress Notes (Signed)
Oxycodone refilled . Pt to be called to insist that she get set up with a pain clinic moving forward.

## 2020-07-26 NOTE — Telephone Encounter (Signed)
Telephone call made to Barbara Jennings to let her know that the final oxycodone prescription has been sent to [facility], and 0.5 tablets every 6  I made it clear to her that it is time for her to be followed at a pain clinic.  I have personally think that she has become habituated to the opiates and this chronic pain needs to be managed by chronic pain clinic and efforts to refer her for surgical evaluation of been unsuccessful and I have advised her to make contact with the pain clinic moving forward as no one in our office will continue to refill the oxycodone for her once I have retired.

## 2020-08-08 ENCOUNTER — Encounter (HOSPITAL_COMMUNITY): Payer: Self-pay

## 2020-08-08 ENCOUNTER — Emergency Department (HOSPITAL_COMMUNITY)
Admission: EM | Admit: 2020-08-08 | Discharge: 2020-08-08 | Disposition: A | Discharge status: Home / Self Care | Payer: Medicaid Other | Attending: Emergency Medicine | Admitting: Emergency Medicine

## 2020-08-08 ENCOUNTER — Other Ambulatory Visit: Payer: Self-pay

## 2020-08-08 ENCOUNTER — Emergency Department (HOSPITAL_COMMUNITY): Payer: Medicaid Other

## 2020-08-08 DIAGNOSIS — J45909 Unspecified asthma, uncomplicated: Secondary | ICD-10-CM | POA: Diagnosis not present

## 2020-08-08 DIAGNOSIS — R109 Unspecified abdominal pain: Secondary | ICD-10-CM | POA: Diagnosis not present

## 2020-08-08 DIAGNOSIS — F17210 Nicotine dependence, cigarettes, uncomplicated: Secondary | ICD-10-CM | POA: Insufficient documentation

## 2020-08-08 DIAGNOSIS — N3000 Acute cystitis without hematuria: Secondary | ICD-10-CM

## 2020-08-08 LAB — BASIC METABOLIC PANEL
Anion gap: 12 (ref 5–15)
BUN: 9 mg/dL (ref 6–20)
CO2: 23 mmol/L (ref 22–32)
Calcium: 9.2 mg/dL (ref 8.9–10.3)
Chloride: 102 mmol/L (ref 98–111)
Creatinine, Ser: 0.59 mg/dL (ref 0.44–1.00)
GFR calc Af Amer: >60 mL/min (ref >60)
GFR calc non Af Amer: >60 mL/min (ref >60)
Glucose, Bld: 108 mg/dL — ABNORMAL HIGH (ref 70–99)
Potassium: 3.8 mmol/L (ref 3.5–5.1)
Sodium: 137 mmol/L (ref 135–145)

## 2020-08-08 LAB — CBC WITH DIFFERENTIAL/PLATELET
Abs Immature Granulocytes: 0.03 10*3/uL (ref 0.00–0.07)
Basophils Absolute: 0 10*3/uL (ref 0.0–0.1)
Basophils Relative: 1 %
Eosinophils Absolute: 0.3 10*3/uL (ref 0.0–0.5)
Eosinophils Relative: 5 %
HCT: 39.3 % (ref 36.0–46.0)
Hemoglobin: 12.8 g/dL (ref 12.0–15.0)
Immature Granulocytes: 1 %
Lymphocytes Relative: 29 %
Lymphs Abs: 1.6 10*3/uL (ref 0.7–4.0)
MCH: 27.5 pg (ref 26.0–34.0)
MCHC: 32.6 g/dL (ref 30.0–36.0)
MCV: 84.3 fL (ref 80.0–100.0)
Monocytes Absolute: 0.4 10*3/uL (ref 0.1–1.0)
Monocytes Relative: 7 %
Neutro Abs: 3.3 10*3/uL (ref 1.7–7.7)
Neutrophils Relative %: 57 %
Platelets: 279 10*3/uL (ref 150–400)
RBC: 4.66 MIL/uL (ref 3.87–5.11)
RDW: 12.8 % (ref 11.5–15.5)
WBC: 5.7 10*3/uL (ref 4.0–10.5)
nRBC: 0 % (ref 0.0–0.2)

## 2020-08-08 LAB — URINALYSIS, ROUTINE W REFLEX MICROSCOPIC
Glucose, UA: NEGATIVE mg/dL
Hgb urine dipstick: NEGATIVE
Ketones, ur: NEGATIVE mg/dL
Leukocytes,Ua: NEGATIVE
Nitrite: POSITIVE — AB
Protein, ur: NEGATIVE mg/dL
Specific Gravity, Urine: 1.024 (ref 1.005–1.030)
pH: 6 (ref 5.0–8.0)

## 2020-08-08 MED ORDER — CEPHALEXIN 500 MG PO CAPS: 500.0000 mg | ORAL_CAPSULE | Freq: Three times a day (TID) | ORAL | 0 refills | Status: DC

## 2020-08-08 MED ORDER — KETOROLAC TROMETHAMINE 30 MG/ML IJ SOLN
30.0000 mg | Freq: Once | INTRAMUSCULAR | Status: AC
  Administered 2020-08-08: 30 mg via INTRAVENOUS
  Filled 2020-08-08: qty 1

## 2020-08-08 NOTE — Discharge Instructions (Signed)
Begin taking Keflex as prescribed.  Take ibuprofen 600 mg every 6 hours as needed for pain.  Drink plenty of fluids and get plenty of rest.  Follow-up with primary doctor if not improving in the next few days, and return to the ER if symptoms significantly worsen or change.

## 2020-08-08 NOTE — ED Provider Notes (Signed)
[facility] EMERGENCY DEPARTMENT Provider Note   CSN: [id number] Arrival date & time: 08/08/20  1003     History Chief Complaint  Patient presents with  . Flank Pain    Barbara Jennings is a 39 y.o. female.  Patient is a 39 year old female with past medical history of cholecystectomy, appendectomy, hysterectomy, asthma, migraines.  She presents today for evaluation of right flank and right lower quadrant pain.  This started yesterday afternoon and worsened through the night.  Patient describes urinary frequency and some dysuria.  She denies any blood in her urine.  She denies any fevers or chills.  She has had UTIs in the past and this feels similar.  She reports a history of kidney stones during a prior pregnancy.  The history is provided by the patient.  Flank Pain This is a new problem. The current episode started yesterday. The problem occurs constantly. The problem has been gradually worsening. Associated symptoms include abdominal pain. Exacerbated by: Movement and palpation. She has tried nothing for the symptoms.       Past Medical History:  Diagnosis Date  . 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 Abnormal Pap smear   . Abnormal uterine bleeding (AUB) 10/11/2014  . Anxiety   . Asthma   . Depression   . Dysmenorrhea 10/11/2014  . HSV-2 (herpes simplex virus 2) infection   . Hx of migraines 02/09/2014   Has ?aura, will rx POP  . Ilioinguinal neuralgia    left  . Migraine   . Other and unspecified ovarian cyst 02/06/2014  . Ovarian cyst 10/18/2014  . Panic attack   . Thickened endometrium 10/18/2014  . Unspecified symptom associated with female genital organs 01/27/2014  . Vaginal Pap smear, abnormal   . Vulvar abscess 03/09/2014   I&D 3/9 in ER at [facility] was rx'd bactrim ds x 10 days    Patient Active Problem List   Diagnosis Date Noted  . Feeling grief Mother with  Stage 4 cancer Dx 10/2018 12/01/2018  . Morbid obesity (HCC) 06/01/2017  . 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 Combined abdominal and pelvic pain 10/26/2015  . Incisional pain s/p hysterectomy 06/26/2015  . Knee pain, chronic 09/12/2014  . Acute meniscal tear of knee 07/03/2014  . Dysplasia of cervix, unspecified 02/16/2014  . Hx of migraines 02/09/2014  . 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 Unspecified symptom associated with female genital organs 01/27/2014  . HSV-2 (herpes simplex virus 2) infection 05/26/2013    Past Surgical History:  Procedure Laterality Date  . ABDOMINAL HYSTERECTOMY    . APPENDECTOMY    . CESAREAN SECTION    . CHOLECYSTECTOMY    . DILATION AND CURETTAGE OF UTERUS    . LAPAROSCOPIC SUPRACERVICAL HYSTERECTOMY N/A 01/02/2015   Procedure: ATTEMPTED LAPAROSCOPIC SUPRACERVICAL HYSTERECTOMY CONVERTED TO OPEN AT 2707;  Surgeon: Tilda Burrow, MD;  Location: [facility];  Service: Gynecology;  Laterality: N/A;  . OVARIAN CYST REMOVAL    . SCAR REVISION N/A 06/26/2015   Procedure: ABDOMINAL SCAR REVISION;  Surgeon: Tilda Burrow, MD;  Location: [facility];  Service: Gynecology;  Laterality: N/A;  . SUPRACERVICAL ABDOMINAL HYSTERECTOMY N/A 01/02/2015   Procedure: HYSTERECTOMY SUPRACERVICAL ABDOMINAL;  Surgeon: Tilda Burrow, MD;  Location: [facility];  Service: Gynecology;  Laterality: N/A;  OB History    Gravida  7   Para  2   Term  1   Preterm  1   AB  5   Living  1     SAB  5   TAB      Ectopic      Multiple      Live Births  1           Family History  Problem Relation Age of Onset  . Ulcers Father   . Cancer Maternal Aunt        breast   . Diabetes Maternal Grandmother   . Diabetes Maternal Grandfather   . Alzheimer's disease Paternal Grandmother   . Diabetes Maternal Aunt   . Cancer - Lung Mother        lump  nodule  . Cancer Mother        brain  . Cancer Paternal Aunt        cervical    Social History   Tobacco Use  . Smoking status: Current Every Day Smoker    Packs/day: 0.00    Years: 12.00    Pack years: 0.00    Types: E-cigarettes  . Smokeless tobacco: Never Used  Vaping Use  . Vaping Use: Every day  . Substances: Flavoring  Substance Use Topics  . Alcohol use: Yes    Comment: socially  . Drug use: No    Home Medications Prior to Admission medications   Medication Sig Start Date End Date Taking? Authorizing Provider  ALPRAZolam Prudy Feeler) 0.5 MG tablet Take 0.5 mg by mouth every 8 (eight) hours as needed for anxiety.    [provider]  butalbital-acetaminophen-caffeine (FIORICET) 50-325-40 MG tablet Take 1 tablet by mouth 4 (four) times daily as needed. 01/20/20   [provider]  furosemide (LASIX) 20 MG tablet Take 20 mg by mouth 2 (two) times daily.    [provider]  Oxycodone HCl 10 MG TABS 1/2 tablet q 6 hours. 07/26/20   Tilda Burrow, MD  citalopram (CELEXA) 20 MG tablet Take 20 mg by mouth daily.    03/04/12  [provider]    Allergies    Coconut flavor  Review of Systems   Review of Systems  Gastrointestinal: Positive for abdominal pain.  Genitourinary: Positive for flank pain.  All other systems reviewed and are negative.   Physical Exam Updated Vital Signs BP 123/66 (BP Location: Left Arm)   Pulse (!) 55   Temp 98.6 F (37 C) (Oral)   Ht 5\' 6"  (1.676 m)   Wt 130.2 kg   LMP  (LMP Unknown)   SpO2 98%   BMI 46.32 kg/m   Physical Exam Vitals and nursing note reviewed.  Constitutional:      General: She is not in acute distress.    Appearance: She is well-developed. She is not diaphoretic.  HENT:     Head: Normocephalic and atraumatic.  Cardiovascular:     Rate and Rhythm: Normal rate and regular rhythm.     Heart sounds: No murmur heard.  No friction rub. No gallop.   Pulmonary:     Effort: Pulmonary  effort is normal. No respiratory distress.     Breath sounds: Normal breath sounds. No wheezing.  Abdominal:     General: Bowel sounds are normal. There is no distension.     Palpations: Abdomen is soft.     Tenderness: There is abdominal tenderness. There is right CVA tenderness. There is  no guarding or rebound.  Musculoskeletal:        General: Normal range of motion.     Cervical back: Normal range of motion and neck supple.  Skin:    General: Skin is warm and dry.  Neurological:     Mental Status: She is alert and oriented to person, place, and time.     ED Results / Procedures / Treatments   Labs (all labs ordered are listed, but only abnormal results are displayed) Labs Reviewed  URINALYSIS, ROUTINE W REFLEX MICROSCOPIC  BASIC METABOLIC PANEL  CBC WITH DIFFERENTIAL/PLATELET    EKG None  Radiology No results found.  Procedures Procedures (including critical care time)  Medications Ordered in ED Medications  ketorolac (TORADOL) 30 MG/ML injection 30 mg (has no administration in time range)    ED Course  I have reviewed the triage vital signs and the nursing notes.  Pertinent labs & imaging results that were available during my care of the patient were reviewed by me and considered in my medical decision making (see chart for details).    MDM Rules/Calculators/A&P  CT with no acute process.  UA positive for nitrite.  Will treat with keflex, prn return.  Final Clinical Impression(s) / ED Diagnoses Final diagnoses:  None    Rx / DC Orders ED Discharge Orders    None       Geoffery Lyons, MD 08/08/20 1225

## 2020-08-08 NOTE — ED Triage Notes (Signed)
Pt reports left flank pain radiating around to pelvic area, nausea, burning with urination, and frequent urination since yesterday.  Reports chills last night and this morning.

## 2020-08-27 ENCOUNTER — Ambulatory Visit: Payer: Medicaid Other | Admitting: Obstetrics and Gynecology

## 2020-08-30 ENCOUNTER — Other Ambulatory Visit: Payer: Self-pay

## 2020-08-30 ENCOUNTER — Other Ambulatory Visit: Payer: Medicaid Other

## 2020-08-30 DIAGNOSIS — Z20822 Contact with and (suspected) exposure to covid-19: Secondary | ICD-10-CM | POA: Diagnosis not present

## 2020-09-01 LAB — NOVEL CORONAVIRUS, NAA: SARS-CoV-2, NAA: NOT DETECTED

## 2020-12-05 ENCOUNTER — Other Ambulatory Visit: Payer: Self-pay

## 2020-12-05 ENCOUNTER — Encounter (HOSPITAL_COMMUNITY): Payer: Self-pay

## 2020-12-05 ENCOUNTER — Emergency Department (HOSPITAL_COMMUNITY): Payer: Medicaid Other

## 2020-12-05 ENCOUNTER — Emergency Department (HOSPITAL_COMMUNITY)
Admission: EM | Admit: 2020-12-05 | Discharge: 2020-12-05 | Disposition: A | Discharge status: Home / Self Care | Payer: Medicaid Other | Attending: Emergency Medicine | Admitting: Emergency Medicine

## 2020-12-05 DIAGNOSIS — M25561 Pain in right knee: Secondary | ICD-10-CM | POA: Diagnosis present

## 2020-12-05 DIAGNOSIS — Z79899 Other long term (current) drug therapy: Secondary | ICD-10-CM | POA: Insufficient documentation

## 2020-12-05 DIAGNOSIS — J45909 Unspecified asthma, uncomplicated: Secondary | ICD-10-CM | POA: Insufficient documentation

## 2020-12-05 DIAGNOSIS — X501XXA Overexertion from prolonged static or awkward postures, initial encounter: Secondary | ICD-10-CM | POA: Insufficient documentation

## 2020-12-05 DIAGNOSIS — F17290 Nicotine dependence, other tobacco product, uncomplicated: Secondary | ICD-10-CM | POA: Insufficient documentation

## 2020-12-05 DIAGNOSIS — G8929 Other chronic pain: Secondary | ICD-10-CM | POA: Insufficient documentation

## 2020-12-05 MED ORDER — TRAMADOL HCL 50 MG PO TABS: 50.0000 mg | ORAL_TABLET | Freq: Four times a day (QID) | ORAL | 0 refills | Status: DC | PRN

## 2020-12-05 NOTE — Discharge Instructions (Addendum)
Your xray is negative for any bony abnormality or knee joint effusion.  Rest, elevate, continue trying heat therapy 20 minutes several times daily.  I recommend continuing with the ibuprofen (do not increase as 800 mg every 8 hours is the maximum safe dose).  You may take the medicine prescribed for additional pain relief.  This will make you drowsy - do not drive within 4 hours of taking tramadol.  Call Dr. Romeo Apple if your symptoms persist.

## 2020-12-05 NOTE — ED Provider Notes (Signed)
[facility] EMERGENCY DEPARTMENT Provider Note   CSN: [id number] Arrival date & time: 12/05/20  0900     History Chief Complaint  Patient presents with  . Knee Pain    Barbara Jennings is a 39 y.o. female with a history as outlined below presenting with right knee pain and swelling which she woke with 2 days ago.  She reports chronic intermittent pain in this knee, last saw Dr. Romeo Apple several years ago with a possible ACL tear, but this turned out not be the case.  She denies any new injury or overuse.  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  Her pain is worsened with full extension and endorses a "charley horse" sensation with this maneuver in her upper calf.  Her pain however localizes to her anterior and lateral knee joint. She has taken ibuprofen 800 mg q 8 hours, also tried a heating pad with minimal relief.  HPI     Past Medical History:  Diagnosis Date  . 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 Abnormal Pap smear   . Abnormal uterine bleeding (AUB) 10/11/2014  . Anxiety   . Asthma   . Depression   . Dysmenorrhea 10/11/2014  . HSV-2 (herpes simplex virus 2) infection   . Hx of migraines 02/09/2014   Has ?aura, will rx POP  . Ilioinguinal neuralgia    left  . Migraine   . Other and unspecified ovarian cyst 02/06/2014  . Ovarian cyst 10/18/2014  . Panic attack   . Thickened endometrium 10/18/2014  . Unspecified symptom associated with female genital organs 01/27/2014  . Vaginal Pap smear, abnormal   . Vulvar abscess 03/09/2014   I&D 3/9 in ER at [facility] was rx'd bactrim ds x 10 days    Patient Active Problem List   Diagnosis Date Noted  . Feeling grief Mother with Stage 4 cancer Dx 10/2018 12/01/2018  . Morbid obesity (HCC) 06/01/2017  . 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  Combined abdominal and pelvic pain 10/26/2015  . Incisional pain s/p hysterectomy 06/26/2015  . Knee pain, chronic 09/12/2014  . Acute meniscal tear of knee 07/03/2014  . Dysplasia of cervix, unspecified 02/16/2014  . Hx of migraines 02/09/2014  . 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 Unspecified symptom associated with female genital organs 01/27/2014  . HSV-2 (herpes simplex virus 2) infection 05/26/2013    Past Surgical History:  Procedure Laterality Date  . ABDOMINAL HYSTERECTOMY    . APPENDECTOMY    . CESAREAN SECTION    . CHOLECYSTECTOMY    . DILATION AND CURETTAGE OF UTERUS    . LAPAROSCOPIC SUPRACERVICAL HYSTERECTOMY N/A 01/02/2015   Procedure: ATTEMPTED LAPAROSCOPIC SUPRACERVICAL HYSTERECTOMY CONVERTED TO OPEN AT 2119;  Surgeon: Tilda Burrow, MD;  Location: [facility];  Service: Gynecology;  Laterality: N/A;  . OVARIAN CYST REMOVAL    . SCAR REVISION N/A 06/26/2015   Procedure: ABDOMINAL SCAR REVISION;  Surgeon: Tilda Burrow, MD;  Location: [facility];  Service: Gynecology;  Laterality: N/A;  . SUPRACERVICAL ABDOMINAL HYSTERECTOMY N/A 01/02/2015   Procedure: HYSTERECTOMY SUPRACERVICAL ABDOMINAL;  Surgeon: Tilda Burrow, MD;  Location: [facility];  Service: Gynecology;  Laterality: N/A;     OB History    Gravida  7  Para  2   Term  1   Preterm  1   AB  5   Living  1     SAB  5   TAB      Ectopic      Multiple      Live Births  1           Family History  Problem Relation Age of Onset  . Ulcers Father   . Cancer Maternal Aunt        breast   . Diabetes Maternal Grandmother   . Diabetes Maternal Grandfather   . Alzheimer's disease Paternal Grandmother   . Diabetes Maternal Aunt   . Cancer - Lung Mother        lump nodule  . Cancer Mother        brain  . Cancer Paternal Aunt        cervical    Social History   Tobacco Use  . Smoking status: Current Every Day Smoker    Packs/day:  0.00    Years: 12.00    Pack years: 0.00    Types: E-cigarettes  . Smokeless tobacco: Never Used  Vaping Use  . Vaping Use: Every day  . Substances: Flavoring  Substance Use Topics  . Alcohol use: Yes    Comment: socially  . Drug use: No    Home Medications Prior to Admission medications   Medication Sig Start Date End Date Taking? Authorizing Provider  ALPRAZolam Prudy Feeler) 0.5 MG tablet Take 0.5 mg by mouth every 8 (eight) hours as needed for anxiety.    [provider]  butalbital-acetaminophen-caffeine (FIORICET) 50-325-40 MG tablet Take 1 tablet by mouth 4 (four) times daily as needed. 01/20/20   [provider]  cephALEXin (KEFLEX) 500 MG capsule Take 1 capsule (500 mg total) by mouth 3 (three) times daily. 08/08/20   Geoffery Lyons, MD  furosemide (LASIX) 20 MG tablet Take 20 mg by mouth 2 (two) times daily.    [provider]  Oxycodone HCl 10 MG TABS 1/2 tablet q 6 hours. 07/26/20   Tilda Burrow, MD  traMADol (ULTRAM) 50 MG tablet Take 1 tablet (50 mg total) by mouth every 6 (six) hours as needed. 12/05/20   Burgess Amor, PA-C  citalopram (CELEXA) 20 MG tablet Take 20 mg by mouth daily.    03/04/12  [provider]    Allergies    Coconut flavor  Review of Systems   Review of Systems  Constitutional: Negative for fever.  Musculoskeletal: Positive for arthralgias and joint swelling. Negative for myalgias.  Neurological: Negative for weakness and numbness.  All other systems reviewed and are negative.   Physical Exam Updated Vital Signs BP 122/77 (BP Location: Right Arm)   Pulse 60   Temp (!) 97.5 F (36.4 C) (Oral)   Resp 16   Ht 5\' 6"  (1.676 m)   Wt 127 kg   LMP  (LMP Unknown)   SpO2 100%   BMI 45.19 kg/m   Physical Exam Constitutional:      Appearance: She is well-developed.  HENT:     Head: Atraumatic.  Cardiovascular:     Pulses:          Dorsalis pedis pulses are 2+ on the right side.     Comments: Pulses equal  bilaterally Musculoskeletal:        General: Tenderness present.     Cervical back: Normal range of motion.     Right knee: Bony  tenderness present. No effusion or erythema. Decreased range of motion. No LCL laxity, MCL laxity, ACL laxity or PCL laxity. Normal patellar mobility.     Instability Tests: Anterior drawer test negative. Posterior drawer test negative.     Comments: Pain anterior meniscus and lateral right knee with anterior drawer and valgus strain.  No ttp or edema of posterior knee or calf.  No erythema of the knee.  Skin:    General: Skin is warm and dry.  Neurological:     Mental Status: She is alert.     Sensory: No sensory deficit.     Deep Tendon Reflexes: Reflexes normal.     ED Results / Procedures / Treatments   Labs (all labs ordered are listed, but only abnormal results are displayed) Labs Reviewed - No data to display  EKG None  Radiology DG Knee Complete 4 Views Right  Result Date: 12/05/2020 CLINICAL DATA:  Anterior knee pain EXAM: RIGHT KNEE - COMPLETE 4+ VIEW COMPARISON:  None. FINDINGS: No evidence of fracture, dislocation, or joint effusion. No evidence of arthropathy or other focal bone abnormality. Soft tissues are unremarkable. IMPRESSION: Negative. Electronically Signed   By: Charlett Nose M.D.   On: 12/05/2020 09:39    Procedures Procedures (including critical care time)  Medications Ordered in ED Medications - No data to display  ED Course  I have reviewed the triage vital signs and the nursing notes.  Pertinent labs & imaging results that were available during my care of the patient were reviewed by me and considered in my medical decision making (see chart for details).    MDM Rules/Calculators/A&P                          Patient with acute right knee pain with the joint that patient has had pain issues in the past.  Her x-ray is negative today for any bony abnormalities or joint effusion.  Her exam suggests a lateral meniscus or  ligament strain.  She was placed in an Ace wrap, encouraged continued ibuprofen and heat therapy, activity as tolerated.  She was prescribed a few tramadol for additional pain relief.  She was encouraged to follow-up with her orthopedist Dr. Romeo Apple for further evaluation of her pain symptoms persist or worsen. Final Clinical Impression(s) / ED Diagnoses Final diagnoses:  Acute pain of right knee    Rx / DC Orders ED Discharge Orders         Ordered    traMADol (ULTRAM) 50 MG tablet  Every 6 hours PRN        12/05/20 0957           Burgess Amor, PA-C 12/05/20 1001    Eber Hong, MD 12/06/20 1447

## 2020-12-05 NOTE — ED Triage Notes (Signed)
Pt presents to ED with complaints of right knee pain x 3 days. NKI

## 2020-12-23 ENCOUNTER — Emergency Department (HOSPITAL_COMMUNITY)
Admission: EM | Admit: 2020-12-23 | Discharge: 2020-12-23 | Disposition: A | Discharge status: Home / Self Care | Payer: Medicaid Other | Attending: Emergency Medicine | Admitting: Emergency Medicine

## 2020-12-23 ENCOUNTER — Encounter (HOSPITAL_COMMUNITY): Payer: Self-pay

## 2020-12-23 ENCOUNTER — Other Ambulatory Visit: Payer: Self-pay

## 2020-12-23 DIAGNOSIS — J069 Acute upper respiratory infection, unspecified: Secondary | ICD-10-CM

## 2020-12-23 DIAGNOSIS — F17290 Nicotine dependence, other tobacco product, uncomplicated: Secondary | ICD-10-CM | POA: Diagnosis not present

## 2020-12-23 DIAGNOSIS — R059 Cough, unspecified: Secondary | ICD-10-CM | POA: Diagnosis present

## 2020-12-23 DIAGNOSIS — J45909 Unspecified asthma, uncomplicated: Secondary | ICD-10-CM | POA: Insufficient documentation

## 2020-12-23 DIAGNOSIS — U071 COVID-19: Secondary | ICD-10-CM | POA: Insufficient documentation

## 2020-12-23 LAB — RESP PANEL BY RT-PCR (FLU A&B, COVID) ARPGX2
Influenza A by PCR: NEGATIVE
Influenza B by PCR: NEGATIVE
SARS Coronavirus 2 by RT PCR: POSITIVE — AB

## 2020-12-23 MED ORDER — DICYCLOMINE HCL 20 MG PO TABS: 20.0000 mg | ORAL_TABLET | Freq: Two times a day (BID) | ORAL | 0 refills | Status: DC | PRN

## 2020-12-23 MED ORDER — ONDANSETRON 4 MG PO TBDP
4.0000 mg | ORAL_TABLET | Freq: Once | ORAL | Status: AC
  Administered 2020-12-23: 13:00:00 4 mg via ORAL
  Filled 2020-12-23: qty 1

## 2020-12-23 MED ORDER — ONDANSETRON HCL 4 MG PO TABS: 4.0000 mg | ORAL_TABLET | Freq: Three times a day (TID) | ORAL | 0 refills | Status: DC | PRN

## 2020-12-23 MED ORDER — DICYCLOMINE HCL 10 MG PO CAPS
10.0000 mg | ORAL_CAPSULE | Freq: Once | ORAL | Status: AC
  Administered 2020-12-23: 13:00:00 10 mg via ORAL
  Filled 2020-12-23: qty 1

## 2020-12-23 NOTE — Discharge Instructions (Signed)
You have been seen here for URI like symptoms.  I have given you Zofran for nausea and Bentyl for stomach spasms please use as needed. I recommend taking Tylenol for fever control and ibuprofen for pain control please follow dosing on the back of bottle.  I recommend staying hydrated and if you do not an appetite, I recommend soups as this will provide you with fluids and calories.  Your Covid test is pending I recommend self quarantine until you get your results back on MyChart.  If you are Covid positive you must self quarantine for 10 days starting on symptom onset.  I would like you to contact "post Covid care" as they will provide you with information how to manage your Covid symptoms.  Follow-up with post Covid care if you are Covid positive.  If not I recommend follow-up with your PCP for further management.  Come back to the emergency department if you develop chest pain, shortness of breath, severe abdominal pain, uncontrolled nausea, vomiting, diarrhea.

## 2020-12-23 NOTE — ED Provider Notes (Signed)
[facility] EMERGENCY DEPARTMENT Provider Note   CSN: [id number] Arrival date & time: 12/23/20  5462     History Chief Complaint  Patient presents with  . Cough    Barbara Jennings is a 39 y.o. female.  HPI   Patient with no significant medical history presents to the emergency department with URI-like symptoms.  Patient states symptoms started about 3 days ago and has progressively gotten worse.  She endorses fevers, chills, nasal congestion, productive cough, abdominal pain, diarrhea and general body aches.  She she denies chest pain, shortness of breath, leg pain or leg swelling.  She endorses that some of her family members were recently diagnosed with Covid and that she is unvaccinated against COVID-19.  She denies  alleviating factors at this time.  Patient denies chest pain, shortness of breath, urinary symptoms, constipation, pedal edema.  Past Medical History:  Diagnosis Date  . 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 Abnormal Pap smear   . Abnormal uterine bleeding (AUB) 10/11/2014  . Anxiety   . Asthma   . Depression   . Dysmenorrhea 10/11/2014  . HSV-2 (herpes simplex virus 2) infection   . Hx of migraines 02/09/2014   Has ?aura, will rx POP  . Ilioinguinal neuralgia    left  . Migraine   . Other and unspecified ovarian cyst 02/06/2014  . Ovarian cyst 10/18/2014  . Panic attack   . Thickened endometrium 10/18/2014  . Unspecified symptom associated with female genital organs 01/27/2014  . Vaginal Pap smear, abnormal   . Vulvar abscess 03/09/2014   I&D 3/9 in ER at [facility] was rx'd bactrim ds x 10 days    Patient Active Problem List   Diagnosis Date Noted  . Feeling grief Mother with Stage 4 cancer Dx 10/2018 12/01/2018  . Morbid obesity (HCC) 06/01/2017  . 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 Combined  abdominal and pelvic pain 10/26/2015  . Incisional pain s/p hysterectomy 06/26/2015  . Knee pain, chronic 09/12/2014  . Acute meniscal tear of knee 07/03/2014  . Dysplasia of cervix, unspecified 02/16/2014  . Hx of migraines 02/09/2014  . 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 Unspecified symptom associated with female genital organs 01/27/2014  . HSV-2 (herpes simplex virus 2) infection 05/26/2013    Past Surgical History:  Procedure Laterality Date  . ABDOMINAL HYSTERECTOMY    . APPENDECTOMY    . CESAREAN SECTION    . CHOLECYSTECTOMY    . DILATION AND CURETTAGE OF UTERUS    . LAPAROSCOPIC SUPRACERVICAL HYSTERECTOMY N/A 01/02/2015   Procedure: ATTEMPTED LAPAROSCOPIC SUPRACERVICAL HYSTERECTOMY CONVERTED TO OPEN AT 7035;  Surgeon: Tilda Burrow, MD;  Location: [facility];  Service: Gynecology;  Laterality: N/A;  . OVARIAN CYST REMOVAL    . SCAR REVISION N/A 06/26/2015   Procedure: ABDOMINAL SCAR REVISION;  Surgeon: Tilda Burrow, MD;  Location: [facility];  Service: Gynecology;  Laterality: N/A;  . SUPRACERVICAL ABDOMINAL HYSTERECTOMY N/A 01/02/2015   Procedure: HYSTERECTOMY SUPRACERVICAL ABDOMINAL;  Surgeon: Tilda Burrow, MD;  Location: [facility];  Service: Gynecology;  Laterality: N/A;     OB History    Gravida  7   Para  2   Term  1   Preterm  1   AB  5   Living  1  SAB  5   IAB      Ectopic      Multiple      Live Births  1           Family History  Problem Relation Age of Onset  . Ulcers Father   . Cancer Maternal Aunt        breast   . Diabetes Maternal Grandmother   . Diabetes Maternal Grandfather   . Alzheimer's disease Paternal Grandmother   . Diabetes Maternal Aunt   . Cancer - Lung Mother        lump nodule  . Cancer Mother        brain  . Cancer Paternal Aunt        cervical    Social History   Tobacco Use  . Smoking status: Current Every Day Smoker    Packs/day: 0.00     Years: 12.00    Pack years: 0.00    Types: E-cigarettes  . Smokeless tobacco: Never Used  Vaping Use  . Vaping Use: Every day  . Substances: Flavoring  Substance Use Topics  . Alcohol use: Yes    Comment: socially  . Drug use: No    Home Medications Prior to Admission medications   Medication Sig Start Date End Date Taking? Authorizing Provider  ALPRAZolam Prudy Feeler) 0.5 MG tablet Take 0.5 mg by mouth every 8 (eight) hours as needed for anxiety.    [provider]  butalbital-acetaminophen-caffeine (FIORICET) 50-325-40 MG tablet Take 1 tablet by mouth 4 (four) times daily as needed. 01/20/20   [provider]  cephALEXin (KEFLEX) 500 MG capsule Take 1 capsule (500 mg total) by mouth 3 (three) times daily. 08/08/20   Geoffery Lyons, MD  dicyclomine (BENTYL) 20 MG tablet Take 1 tablet (20 mg total) by mouth 2 (two) times daily as needed for spasms. 12/23/20   Carroll Sage, PA-C  furosemide (LASIX) 20 MG tablet Take 20 mg by mouth 2 (two) times daily.    [provider]  ondansetron (ZOFRAN) 4 MG tablet Take 1 tablet (4 mg total) by mouth every 8 (eight) hours as needed for nausea or vomiting. 12/23/20   Carroll Sage, PA-C  Oxycodone HCl 10 MG TABS 1/2 tablet q 6 hours. 07/26/20   Tilda Burrow, MD  traMADol (ULTRAM) 50 MG tablet Take 1 tablet (50 mg total) by mouth every 6 (six) hours as needed. 12/05/20   Burgess Amor, PA-C  citalopram (CELEXA) 20 MG tablet Take 20 mg by mouth daily.    03/04/12  [provider]    Allergies    Coconut flavor  Review of Systems   Review of Systems  Constitutional: Positive for appetite change, chills and fever.  HENT: Positive for congestion. Negative for trouble swallowing.   Eyes: Negative for visual disturbance.  Respiratory: Positive for cough. Negative for shortness of breath.   Cardiovascular: Negative for chest pain.  Gastrointestinal: Positive for abdominal pain, diarrhea and nausea. Negative for  vomiting.  Genitourinary: Negative for dysuria, enuresis and flank pain.  Musculoskeletal: Positive for myalgias. Negative for back pain.  Skin: Negative for rash.  Neurological: Positive for headaches.  Hematological: Does not bruise/bleed easily.    Physical Exam Updated Vital Signs BP 122/72 (BP Location: Left Wrist)   Pulse 60   Temp 98.2 F (36.8 C) (Oral)   Resp 18   Ht 5\' 6"  (1.676 m)   Wt 134.3 kg   LMP  (LMP Unknown)  SpO2 98%   BMI 47.78 kg/m   Physical Exam Vitals and nursing note reviewed.  Constitutional:      General: She is not in acute distress.    Appearance: She is not ill-appearing.  HENT:     Head: Normocephalic and atraumatic.     Right Ear: Tympanic membrane, ear canal and external ear normal.     Left Ear: Tympanic membrane, ear canal and external ear normal.     Nose: Congestion present.     Mouth/Throat:     Mouth: Mucous membranes are moist.     Pharynx: Oropharynx is clear. No oropharyngeal exudate or posterior oropharyngeal erythema.  Eyes:     Conjunctiva/sclera: Conjunctivae normal.  Cardiovascular:     Rate and Rhythm: Normal rate and regular rhythm.     Pulses: Normal pulses.     Heart sounds: No murmur heard. No friction rub. No gallop.   Pulmonary:     Effort: No respiratory distress.     Breath sounds: No wheezing, rhonchi or rales.  Abdominal:     Tenderness: There is abdominal tenderness. There is no right CVA tenderness or left CVA tenderness.     Comments: Patient's abdomen was nondistended, dull to percussion, normoactive bowel sounds, she had slight epigastric pain but no peritoneal sign, no rebound tenderness noted.  Musculoskeletal:     Right lower leg: No edema.     Left lower leg: No edema.  Skin:    General: Skin is warm and dry.  Neurological:     Mental Status: She is alert.  Psychiatric:        Mood and Affect: Mood normal.     ED Results / Procedures / Treatments   Labs (all labs ordered are listed, but  only abnormal results are displayed) Labs Reviewed  RESP PANEL BY RT-PCR (FLU A&B, COVID) ARPGX2 - Abnormal; Notable for the following components:      Result Value   SARS Coronavirus 2 by RT PCR POSITIVE (*)    All other components within normal limits    EKG None  Radiology No results found.  Procedures Procedures (including critical care time)  Medications Ordered in ED Medications  dicyclomine (BENTYL) capsule 10 mg (10 mg Oral Given 12/23/20 1310)  ondansetron (ZOFRAN-ODT) disintegrating tablet 4 mg (4 mg Oral Given 12/23/20 1310)    ED Course  I have reviewed the triage vital signs and the nursing notes.  Pertinent labs & imaging results that were available during my care of the patient were reviewed by me and considered in my medical decision making (see chart for details).    MDM Rules/Calculators/A&P                          Patient presents with URI-like symptoms.  She is alert, does not appear in acute distress, vital signs reassuring.  Will obtain respiratory panel for further evaluation.  Respiratory panel positive for Covid due to patient's BMI of 47 will recommend antibiotic infusions for treatment.  Low suspicion for systemic infection as patient is nontoxic-appearing, vital signs reassuring, no obvious source infection noted on exam.  Low suspicion for pneumonia as lung sounds are clear bilaterally.  I have low suspicion for PE as patient denies pleuritic chest pain, shortness of breath,patient is PERC. low suspicion for strep throat as oropharynx was visualized, no erythema or exudates noted.  Low suspicion patient would need  hospitalized due to viral infection or Covid as vital  signs reassuring, patient is not in respiratory distress.  Suspect patient's symptoms are related due to Covid, will refer patient to infusion clinic, provide over-the-counter pain medications have follow-up with post Covid care for further evaluation.   Vital signs have remained  stable, no indication for hospital admission.  Patient given at home care as well strict return precautions.  Patient verbalized that they understood agreed to said plan.   Final Clinical Impression(s) / ED Diagnoses Final diagnoses:  Viral URI with cough    Rx / DC Orders ED Discharge Orders         Ordered    ondansetron (ZOFRAN) 4 MG tablet  Every 8 hours PRN        12/23/20 1249    dicyclomine (BENTYL) 20 MG tablet  2 times daily PRN        12/23/20 1249           Carroll Sage, PA-C 12/23/20 1604    Vanetta Mulders, MD 01/05/21 2355

## 2020-12-23 NOTE — ED Triage Notes (Signed)
Pt presents to ED with complaints of non productive cough, congestion, headache, nausea and diarrhea x 2 weeks. Pt states she has family in house positive covid.

## 2020-12-23 NOTE — ED Notes (Signed)
VS delayed due to an acute MI who wondered into zone B without triage

## 2020-12-23 NOTE — ED Notes (Signed)
Morbidly obese  Chose not to have covid vaccine   Here for eval of covid   Mother in law w same   Has child in room   VS stable   Moist MM

## 2020-12-24 ENCOUNTER — Telehealth: Payer: Self-pay | Admitting: Internal Medicine

## 2020-12-24 NOTE — Telephone Encounter (Signed)
Called to Discuss with patient about Covid symptoms and the use of the monoclonal antibody infusion for those with mild to moderate Covid symptoms and at a high risk of hospitalization.     Pt appears to qualify for this infusion due to co-morbid conditions and/or a member of an at-risk group in accordance with the FDA Emergency Use Authorization. BMI 47.   Unable to reach pt. Left VM and MyChart message.  Cyndee Brightly, NP [facility]

## 2020-12-26 ENCOUNTER — Telehealth (HOSPITAL_COMMUNITY): Payer: Self-pay

## 2020-12-30 ENCOUNTER — Emergency Department (HOSPITAL_COMMUNITY)
Admission: EM | Admit: 2020-12-30 | Discharge: 2020-12-30 | Disposition: A | Discharge status: Home / Self Care | Payer: Medicaid Other | Attending: Emergency Medicine | Admitting: Emergency Medicine

## 2020-12-30 ENCOUNTER — Encounter (HOSPITAL_COMMUNITY): Payer: Self-pay | Admitting: Emergency Medicine

## 2020-12-30 ENCOUNTER — Emergency Department (HOSPITAL_COMMUNITY): Payer: Medicaid Other

## 2020-12-30 ENCOUNTER — Other Ambulatory Visit: Payer: Self-pay

## 2020-12-30 DIAGNOSIS — F17290 Nicotine dependence, other tobacco product, uncomplicated: Secondary | ICD-10-CM | POA: Diagnosis not present

## 2020-12-30 DIAGNOSIS — J45909 Unspecified asthma, uncomplicated: Secondary | ICD-10-CM | POA: Insufficient documentation

## 2020-12-30 DIAGNOSIS — R059 Cough, unspecified: Secondary | ICD-10-CM | POA: Diagnosis present

## 2020-12-30 DIAGNOSIS — R0602 Shortness of breath: Secondary | ICD-10-CM

## 2020-12-30 DIAGNOSIS — R079 Chest pain, unspecified: Secondary | ICD-10-CM | POA: Diagnosis not present

## 2020-12-30 DIAGNOSIS — R519 Headache, unspecified: Secondary | ICD-10-CM

## 2020-12-30 DIAGNOSIS — U071 COVID-19: Secondary | ICD-10-CM

## 2020-12-30 LAB — CBC WITH DIFFERENTIAL/PLATELET
Abs Immature Granulocytes: 0.03 10*3/uL (ref 0.00–0.07)
Basophils Absolute: 0.1 10*3/uL (ref 0.0–0.1)
Basophils Relative: 1 %
Eosinophils Absolute: 0.1 10*3/uL (ref 0.0–0.5)
Eosinophils Relative: 1 %
HCT: 40.7 % (ref 36.0–46.0)
Hemoglobin: 13.7 g/dL (ref 12.0–15.0)
Immature Granulocytes: 0 %
Lymphocytes Relative: 23 %
Lymphs Abs: 2.2 10*3/uL (ref 0.7–4.0)
MCH: 28.4 pg (ref 26.0–34.0)
MCHC: 33.7 g/dL (ref 30.0–36.0)
MCV: 84.4 fL (ref 80.0–100.0)
Monocytes Absolute: 0.6 10*3/uL (ref 0.1–1.0)
Monocytes Relative: 7 %
Neutro Abs: 6.4 10*3/uL (ref 1.7–7.7)
Neutrophils Relative %: 68 %
Platelets: 300 10*3/uL (ref 150–400)
RBC: 4.82 MIL/uL (ref 3.87–5.11)
RDW: 12.7 % (ref 11.5–15.5)
WBC: 9.4 10*3/uL (ref 4.0–10.5)
nRBC: 0 % (ref 0.0–0.2)

## 2020-12-30 LAB — BASIC METABOLIC PANEL
Anion gap: 11 (ref 5–15)
BUN: 10 mg/dL (ref 6–20)
CO2: 27 mmol/L (ref 22–32)
Calcium: 9.6 mg/dL (ref 8.9–10.3)
Chloride: 101 mmol/L (ref 98–111)
Creatinine, Ser: 0.72 mg/dL (ref 0.44–1.00)
GFR, Estimated: >60 mL/min (ref >60)
Glucose, Bld: 92 mg/dL (ref 70–99)
Potassium: 2.9 mmol/L — ABNORMAL LOW (ref 3.5–5.1)
Sodium: 139 mmol/L (ref 135–145)

## 2020-12-30 LAB — TROPONIN I (HIGH SENSITIVITY)
Troponin I (High Sensitivity): 17 ng/L (ref <18)
Troponin I (High Sensitivity): 19 ng/L — ABNORMAL HIGH (ref <18)

## 2020-12-30 LAB — HCG, QUANTITATIVE, PREGNANCY: hCG, Beta Chain, Quant, S: <1 m[IU]/mL (ref <5)

## 2020-12-30 MED ORDER — POTASSIUM CHLORIDE CRYS ER 20 MEQ PO TBCR
40.0000 meq | EXTENDED_RELEASE_TABLET | Freq: Once | ORAL | Status: AC
  Administered 2020-12-30: 40 meq via ORAL
  Filled 2020-12-30: qty 2

## 2020-12-30 MED ORDER — KETOROLAC TROMETHAMINE 60 MG/2ML IM SOLN
60.0000 mg | Freq: Once | INTRAMUSCULAR | Status: AC
  Administered 2020-12-30: 60 mg via INTRAMUSCULAR
  Filled 2020-12-30: qty 2

## 2020-12-30 NOTE — ED Notes (Signed)
Pt reports covid pos since 12/26  Here for complaint of chest tightness

## 2020-12-30 NOTE — ED Provider Notes (Signed)
[facility] EMERGENCY DEPARTMENT Provider Note   CSN: [id number] Arrival date & time: 12/30/20  1438     History Chief Complaint  Patient presents with  . Chest Pain    Barbara Jennings is a 40 y.o. female.  The history is provided by the patient. No language interpreter was used.  Cough Cough characteristics:  Non-productive Sputum characteristics:  Nondescript Severity:  Moderate Onset quality:  Gradual Duration:  1 week Timing:  Constant Progression:  Worsening Chronicity:  New Relieved by:  Nothing Worsened by:  Nothing Ineffective treatments:  None tried Associated symptoms: chest pain and myalgias   Associated symptoms: no chills        Past Medical History:  Diagnosis Date  . 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 Abnormal Pap smear   . Abnormal uterine bleeding (AUB) 10/11/2014  . Anxiety   . Asthma   . Depression   . Dysmenorrhea 10/11/2014  . HSV-2 (herpes simplex virus 2) infection   . Hx of migraines 02/09/2014   Has ?aura, will rx POP  . Ilioinguinal neuralgia    left  . Migraine   . Other and unspecified ovarian cyst 02/06/2014  . Ovarian cyst 10/18/2014  . Panic attack   . Thickened endometrium 10/18/2014  . Unspecified symptom associated with female genital organs 01/27/2014  . Vaginal Pap smear, abnormal   . Vulvar abscess 03/09/2014   I&D 3/9 in ER at [facility] was rx'd bactrim ds x 10 days    Patient Active Problem List   Diagnosis Date Noted  . Feeling grief Mother with Stage 4 cancer Dx 10/2018 12/01/2018  . Morbid obesity ([place]) 06/01/2017  . 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 Combined abdominal and pelvic pain 10/26/2015  . Incisional pain s/p hysterectomy 06/26/2015  . Knee pain, chronic 09/12/2014  . Acute meniscal tear of knee 07/03/2014  . Dysplasia of cervix, unspecified  02/16/2014  . Hx of migraines 02/09/2014  . 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 Unspecified symptom associated with female genital organs 01/27/2014  . HSV-2 (herpes simplex virus 2) infection 05/26/2013    Past Surgical History:  Procedure Laterality Date  . ABDOMINAL HYSTERECTOMY    . APPENDECTOMY    . CESAREAN SECTION    . CHOLECYSTECTOMY    . DILATION AND CURETTAGE OF UTERUS    . LAPAROSCOPIC SUPRACERVICAL HYSTERECTOMY N/A 01/02/2015   Procedure: ATTEMPTED LAPAROSCOPIC SUPRACERVICAL HYSTERECTOMY CONVERTED TO OPEN AT 1027;  Surgeon: Jonnie Kind, MD;  Location: [facility];  Service: Gynecology;  Laterality: N/A;  . OVARIAN CYST REMOVAL    . SCAR REVISION N/A 06/26/2015   Procedure: ABDOMINAL SCAR REVISION;  Surgeon: Jonnie Kind, MD;  Location: [facility];  Service: Gynecology;  Laterality: N/A;  . SUPRACERVICAL ABDOMINAL HYSTERECTOMY N/A 01/02/2015   Procedure: HYSTERECTOMY SUPRACERVICAL ABDOMINAL;  Surgeon: Jonnie Kind, MD;  Location: [facility];  Service: Gynecology;  Laterality: N/A;     OB History    Gravida  7   Para  2   Term  1   Preterm  1   AB  5   Living  1     SAB  5   IAB      Ectopic      Multiple      Live Births  1  Family History  Problem Relation Age of Onset  . Ulcers Father   . Cancer Maternal Aunt        breast   . Diabetes Maternal Grandmother   . Diabetes Maternal Grandfather   . Alzheimer's disease Paternal Grandmother   . Diabetes Maternal Aunt   . Cancer - Lung Mother        lump nodule  . Cancer Mother        brain  . Cancer Paternal Aunt        cervical    Social History   Tobacco Use  . Smoking status: Current Every Day Smoker    Packs/day: 0.00    Years: 12.00    Pack years: 0.00    Types: E-cigarettes  . Smokeless tobacco: Never Used  Vaping Use  . Vaping Use: Every day  . Substances: Flavoring  Substance Use Topics  . Alcohol use: Yes     Comment: socially  . Drug use: No    Home Medications Prior to Admission medications   Medication Sig Start Date End Date Taking? Authorizing Provider  ALPRAZolam Prudy Feeler) 0.5 MG tablet Take 0.5 mg by mouth every 8 (eight) hours as needed for anxiety.    [provider]  butalbital-acetaminophen-caffeine (FIORICET) 50-325-40 MG tablet Take 1 tablet by mouth 4 (four) times daily as needed. 01/20/20   [provider]  cephALEXin (KEFLEX) 500 MG capsule Take 1 capsule (500 mg total) by mouth 3 (three) times daily. 08/08/20   Geoffery Lyons, MD  dicyclomine (BENTYL) 20 MG tablet Take 1 tablet (20 mg total) by mouth 2 (two) times daily as needed for spasms. 12/23/20   Carroll Sage, PA-C  furosemide (LASIX) 20 MG tablet Take 20 mg by mouth 2 (two) times daily.    [provider]  ondansetron (ZOFRAN) 4 MG tablet Take 1 tablet (4 mg total) by mouth every 8 (eight) hours as needed for nausea or vomiting. 12/23/20   Carroll Sage, PA-C  Oxycodone HCl 10 MG TABS 1/2 tablet q 6 hours. 07/26/20   Tilda Burrow, MD  traMADol (ULTRAM) 50 MG tablet Take 1 tablet (50 mg total) by mouth every 6 (six) hours as needed. 12/05/20   Burgess Amor, PA-C  citalopram (CELEXA) 20 MG tablet Take 20 mg by mouth daily.    03/04/12  [provider]    Allergies    Coconut flavor  Review of Systems   Review of Systems  Constitutional: Negative for chills.  Respiratory: Positive for cough.   Cardiovascular: Positive for chest pain.  Musculoskeletal: Positive for myalgias.  All other systems reviewed and are negative.   Physical Exam Updated Vital Signs BP 128/69 (BP Location: Right Arm)   Pulse (!) 49   Temp 98.6 F (37 C) (Oral)   Resp 18   Ht 5\' 6"  (1.676 m)   Wt 134.3 kg   LMP  (LMP Unknown)   SpO2 100%   BMI 47.78 kg/m   Physical Exam Vitals and nursing note reviewed.  Constitutional:      Appearance: She is well-developed and well-nourished.  HENT:      Head: Normocephalic.  Eyes:     Extraocular Movements: EOM normal.  Cardiovascular:     Rate and Rhythm: Normal rate and regular rhythm.     Heart sounds: Normal heart sounds.  Pulmonary:     Effort: Pulmonary effort is normal.     Breath sounds: Normal breath sounds.  Abdominal:  General: There is no distension.  Musculoskeletal:        General: Normal range of motion.     Cervical back: Normal range of motion.  Skin:    General: Skin is warm.  Neurological:     General: No focal deficit present.     Mental Status: She is alert and oriented to person, place, and time.  Psychiatric:        Mood and Affect: Mood and affect and mood normal.     ED Results / Procedures / Treatments   Labs (all labs ordered are listed, but only abnormal results are displayed) Labs Reviewed  BASIC METABOLIC PANEL - Abnormal; Notable for the following components:      Result Value   Potassium 2.9 (*)    All other components within normal limits  TROPONIN I (HIGH SENSITIVITY) - Abnormal; Notable for the following components:   Troponin I (High Sensitivity) 19 (*)    All other components within normal limits  CBC WITH DIFFERENTIAL/PLATELET  HCG, QUANTITATIVE, PREGNANCY  TROPONIN I (HIGH SENSITIVITY)    EKG EKG Interpretation  Date/Time:  Sunday December 30 2020 15:41:07 EST Ventricular Rate:  61 PR Interval:  138 QRS Duration: 74 QT Interval:  400 QTC Calculation: 402 R Axis:   79 Text Interpretation: Sinus rhythm with marked sinus arrhythmia ST & T wave abnormality, consider inferior ischemia ST & T wave abnormality, consider anterior ischemia Abnormal ECG ST changes new from prior. Confirmed by Benjiman Core [phone]) on 12/30/2020 6:03:59 PM   Radiology DG Chest Port 1 View  Result Date: 12/30/2020 CLINICAL DATA:  COVID positive.  Chest pain today. EXAM: PORTABLE CHEST 1 VIEW COMPARISON:  09/17/2016 FINDINGS: The heart size and mediastinal contours are within normal limits.  Both lungs are clear. The visualized skeletal structures are unremarkable. IMPRESSION: No active disease. Electronically Signed   By: Burman Nieves M.D.   On: 12/30/2020 16:09    Procedures Procedures (including critical care time)  Medications Ordered in ED Medications - No data to display  ED Course  I have reviewed the triage vital signs and the nursing notes.  Pertinent labs & imaging results that were available during my care of the patient were reviewed by me and considered in my medical decision making (see chart for details).    MDM Rules/Calculators/A&P                          MDM:  Chest xray no pneumonia, EKG shows st changes. Troponin 17 and 19.  I doubt cardiac ischemia  No 02 requirement.  Pt given Toradol Im for headache.  Pt is concerned that she is dehydrated.  Vitals stable  Labs potassium 2.9.  This may be causing increased myalgias.  Pt encouraged to drink.  Final Clinical Impression(s) / ED Diagnoses Final diagnoses:  COVID  Nonintractable headache, unspecified chronicity pattern, unspecified headache type    Rx / DC Orders ED Discharge Orders    None    An After Visit Summary was printed and given to the patient.    Osie Cheeks 12/30/20 2157    Benjiman Core, MD 12/31/20 0005

## 2020-12-30 NOTE — ED Triage Notes (Signed)
Pt to the ED covid positive (12/23/20) with chest pain described as tightness that radiates into her neck.

## 2021-01-02 DIAGNOSIS — G894 Chronic pain syndrome: Secondary | ICD-10-CM | POA: Diagnosis not present

## 2021-01-02 DIAGNOSIS — U071 COVID-19: Secondary | ICD-10-CM | POA: Diagnosis not present

## 2021-01-02 DIAGNOSIS — F419 Anxiety disorder, unspecified: Secondary | ICD-10-CM | POA: Diagnosis not present

## 2021-02-21 DIAGNOSIS — F419 Anxiety disorder, unspecified: Secondary | ICD-10-CM | POA: Diagnosis not present

## 2021-02-21 DIAGNOSIS — Z6841 Body Mass Index (BMI) 40.0 and over, adult: Secondary | ICD-10-CM | POA: Diagnosis not present

## 2021-02-21 DIAGNOSIS — R5383 Other fatigue: Secondary | ICD-10-CM | POA: Diagnosis not present

## 2021-02-21 DIAGNOSIS — Z13220 Encounter for screening for lipoid disorders: Secondary | ICD-10-CM | POA: Diagnosis not present

## 2021-02-22 DIAGNOSIS — F419 Anxiety disorder, unspecified: Secondary | ICD-10-CM | POA: Diagnosis not present

## 2021-02-22 DIAGNOSIS — Z6841 Body Mass Index (BMI) 40.0 and over, adult: Secondary | ICD-10-CM | POA: Diagnosis not present

## 2021-02-22 DIAGNOSIS — Z13220 Encounter for screening for lipoid disorders: Secondary | ICD-10-CM | POA: Diagnosis not present

## 2021-03-19 DIAGNOSIS — G43909 Migraine, unspecified, not intractable, without status migrainosus: Secondary | ICD-10-CM | POA: Diagnosis not present

## 2021-03-19 DIAGNOSIS — Z6841 Body Mass Index (BMI) 40.0 and over, adult: Secondary | ICD-10-CM | POA: Diagnosis not present

## 2021-03-19 DIAGNOSIS — F419 Anxiety disorder, unspecified: Secondary | ICD-10-CM | POA: Diagnosis not present

## 2021-03-19 DIAGNOSIS — F410 Panic disorder [episodic paroxysmal anxiety] without agoraphobia: Secondary | ICD-10-CM | POA: Diagnosis not present

## 2021-04-30 ENCOUNTER — Other Ambulatory Visit (HOSPITAL_COMMUNITY): Payer: Self-pay | Admitting: Physician Assistant

## 2021-04-30 DIAGNOSIS — M25571 Pain in right ankle and joints of right foot: Secondary | ICD-10-CM

## 2021-04-30 DIAGNOSIS — Z6841 Body Mass Index (BMI) 40.0 and over, adult: Secondary | ICD-10-CM | POA: Diagnosis not present

## 2021-04-30 DIAGNOSIS — M25572 Pain in left ankle and joints of left foot: Secondary | ICD-10-CM

## 2021-04-30 DIAGNOSIS — R600 Localized edema: Secondary | ICD-10-CM | POA: Diagnosis not present

## 2021-05-06 ENCOUNTER — Other Ambulatory Visit: Payer: Self-pay

## 2021-05-06 ENCOUNTER — Ambulatory Visit (HOSPITAL_COMMUNITY)
Admission: RE | Admit: 2021-05-06 | Discharge: 2021-05-06 | Disposition: A | Discharge status: Home / Self Care | Payer: Medicaid Other | Source: Ambulatory Visit | Attending: Physician Assistant | Admitting: Physician Assistant

## 2021-05-06 DIAGNOSIS — M25572 Pain in left ankle and joints of left foot: Secondary | ICD-10-CM | POA: Insufficient documentation

## 2021-05-06 DIAGNOSIS — M25571 Pain in right ankle and joints of right foot: Secondary | ICD-10-CM | POA: Insufficient documentation

## 2021-05-06 DIAGNOSIS — M7731 Calcaneal spur, right foot: Secondary | ICD-10-CM | POA: Diagnosis not present

## 2021-05-16 ENCOUNTER — Other Ambulatory Visit: Payer: Self-pay

## 2021-05-16 ENCOUNTER — Emergency Department (HOSPITAL_COMMUNITY)
Admission: EM | Admit: 2021-05-16 | Discharge: 2021-05-16 | Disposition: A | Discharge status: Home / Self Care | Payer: Medicaid Other | Attending: Emergency Medicine | Admitting: Emergency Medicine

## 2021-05-16 ENCOUNTER — Encounter (HOSPITAL_COMMUNITY): Payer: Self-pay

## 2021-05-16 DIAGNOSIS — I10 Essential (primary) hypertension: Secondary | ICD-10-CM | POA: Diagnosis not present

## 2021-05-16 DIAGNOSIS — F17290 Nicotine dependence, other tobacco product, uncomplicated: Secondary | ICD-10-CM | POA: Diagnosis not present

## 2021-05-16 DIAGNOSIS — R109 Unspecified abdominal pain: Secondary | ICD-10-CM | POA: Diagnosis not present

## 2021-05-16 DIAGNOSIS — R112 Nausea with vomiting, unspecified: Secondary | ICD-10-CM | POA: Diagnosis not present

## 2021-05-16 DIAGNOSIS — J45909 Unspecified asthma, uncomplicated: Secondary | ICD-10-CM | POA: Diagnosis not present

## 2021-05-16 DIAGNOSIS — Z79899 Other long term (current) drug therapy: Secondary | ICD-10-CM | POA: Diagnosis not present

## 2021-05-16 DIAGNOSIS — R197 Diarrhea, unspecified: Secondary | ICD-10-CM | POA: Insufficient documentation

## 2021-05-16 LAB — URINALYSIS, ROUTINE W REFLEX MICROSCOPIC
Bilirubin Urine: NEGATIVE
Glucose, UA: NEGATIVE mg/dL
Hgb urine dipstick: NEGATIVE
Ketones, ur: NEGATIVE mg/dL
Leukocytes,Ua: NEGATIVE
Nitrite: NEGATIVE
Protein, ur: NEGATIVE mg/dL
Specific Gravity, Urine: 1.023 (ref 1.005–1.030)
pH: 5 (ref 5.0–8.0)

## 2021-05-16 LAB — COMPREHENSIVE METABOLIC PANEL
ALT: 34 U/L (ref 0–44)
AST: 29 U/L (ref 15–41)
Albumin: 4.3 g/dL (ref 3.5–5.0)
Alkaline Phosphatase: 57 U/L (ref 38–126)
Anion gap: 9 (ref 5–15)
BUN: 9 mg/dL (ref 6–20)
CO2: 26 mmol/L (ref 22–32)
Calcium: 9.5 mg/dL (ref 8.9–10.3)
Chloride: 100 mmol/L (ref 98–111)
Creatinine, Ser: 0.55 mg/dL (ref 0.44–1.00)
GFR, Estimated: >60 mL/min (ref >60)
Glucose, Bld: 97 mg/dL (ref 70–99)
Potassium: 3.7 mmol/L (ref 3.5–5.1)
Sodium: 135 mmol/L (ref 135–145)
Total Bilirubin: 1.5 mg/dL — ABNORMAL HIGH (ref 0.3–1.2)
Total Protein: 7.9 g/dL (ref 6.5–8.1)

## 2021-05-16 LAB — CBC WITH DIFFERENTIAL/PLATELET
Abs Immature Granulocytes: 0.01 10*3/uL (ref 0.00–0.07)
Basophils Absolute: 0.1 10*3/uL (ref 0.0–0.1)
Basophils Relative: 1 %
Eosinophils Absolute: 0.1 10*3/uL (ref 0.0–0.5)
Eosinophils Relative: 1 %
HCT: 39.4 % (ref 36.0–46.0)
Hemoglobin: 13 g/dL (ref 12.0–15.0)
Immature Granulocytes: 0 %
Lymphocytes Relative: 22 %
Lymphs Abs: 1.6 10*3/uL (ref 0.7–4.0)
MCH: 28.3 pg (ref 26.0–34.0)
MCHC: 33 g/dL (ref 30.0–36.0)
MCV: 85.8 fL (ref 80.0–100.0)
Monocytes Absolute: 0.3 10*3/uL (ref 0.1–1.0)
Monocytes Relative: 5 %
Neutro Abs: 5.3 10*3/uL (ref 1.7–7.7)
Neutrophils Relative %: 71 %
Platelets: 306 10*3/uL (ref 150–400)
RBC: 4.59 MIL/uL (ref 3.87–5.11)
RDW: 12.6 % (ref 11.5–15.5)
WBC: 7.5 10*3/uL (ref 4.0–10.5)
nRBC: 0 % (ref 0.0–0.2)

## 2021-05-16 LAB — LIPASE, BLOOD: Lipase: 24 U/L (ref 11–51)

## 2021-05-16 MED ORDER — PROMETHAZINE HCL 12.5 MG PO TABS
25.0000 mg | ORAL_TABLET | Freq: Four times a day (QID) | ORAL | Status: DC | PRN
  Administered 2021-05-16: 25 mg via ORAL
  Filled 2021-05-16: qty 2

## 2021-05-16 MED ORDER — SODIUM CHLORIDE 0.9 % IV BOLUS
1000.0000 mL | Freq: Once | INTRAVENOUS | Status: AC
  Administered 2021-05-16: 1000 mL via INTRAVENOUS

## 2021-05-16 MED ORDER — ONDANSETRON HCL 4 MG/2ML IJ SOLN
4.0000 mg | Freq: Once | INTRAMUSCULAR | Status: AC
  Administered 2021-05-16: 4 mg via INTRAVENOUS
  Filled 2021-05-16: qty 2

## 2021-05-16 MED ORDER — ONDANSETRON HCL 4 MG PO TABS: 4.0000 mg | ORAL_TABLET | Freq: Four times a day (QID) | ORAL | 0 refills | Status: DC

## 2021-05-16 NOTE — Discharge Instructions (Signed)
Frequent small sips of clear fluids today.  Bland diet as tolerated starting this evening or tomorrow.  Your blood pressure today is elevated.  You will need to have this rechecked by your primary care provider.  Return to the emergency department for any new or worsening symptoms.

## 2021-05-16 NOTE — ED Triage Notes (Signed)
Pt complaining of nausea body aches and pains since this morning at 330am.  She works at [employer] lots of her co workers are sick and her kids had the stomach "bug" last week,  Skin warm and dry, alert and oriented

## 2021-05-16 NOTE — ED Provider Notes (Signed)
[facility] EMERGENCY DEPARTMENT Provider Note   CSN: [id number] Arrival date & time: 05/16/21  3354     History Chief Complaint  Patient presents with  . Abdominal Pain  . Nausea    Barbara Jennings is a 40 y.o. female.  HPI      Barbara Jennings is a 40 y.o. female who presents to the Emergency Department complaining of nausea, vomiting, diarrhea with crampy abdominal pain.  Symptoms began at 3:30 AM this morning.  Woke up with several episodes of vomiting.  Describes having loose stools that are nonbloody or black.  Crampy abdominal pain, worse with dry heaves.  States her kids and coworkers had a "stomach bug" last week.  She denies chest pain, shortness of breath, fever, back or pelvic pain.  No dysuria.    Past Medical History:  Diagnosis Date  . 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 Abnormal Pap smear   . Abnormal uterine bleeding (AUB) 10/11/2014  . Anxiety   . Asthma   . Depression   . Dysmenorrhea 10/11/2014  . HSV-2 (herpes simplex virus 2) infection   . Hx of migraines 02/09/2014   Has ?aura, will rx POP  . Ilioinguinal neuralgia    left  . Migraine   . Other and unspecified ovarian cyst 02/06/2014  . Ovarian cyst 10/18/2014  . Panic attack   . Thickened endometrium 10/18/2014  . Unspecified symptom associated with female genital organs 01/27/2014  . Vaginal Pap smear, abnormal   . Vulvar abscess 03/09/2014   I&D 3/9 in ER at [facility] was rx'd bactrim ds x 10 days    Patient Active Problem List   Diagnosis Date Noted  . Feeling grief Mother with Stage 4 cancer Dx 10/2018 12/01/2018  . Morbid obesity ([facility]) 06/01/2017  . 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 Combined abdominal and pelvic pain 10/26/2015  . Incisional pain s/p hysterectomy 06/26/2015  . Knee pain, chronic 09/12/2014  . Acute meniscal  tear of knee 07/03/2014  . Dysplasia of cervix, unspecified 02/16/2014  . Hx of migraines 02/09/2014  . 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 Unspecified symptom associated with female genital organs 01/27/2014  . HSV-2 (herpes simplex virus 2) infection 05/26/2013    Past Surgical History:  Procedure Laterality Date  . ABDOMINAL HYSTERECTOMY    . APPENDECTOMY    . CESAREAN SECTION    . CHOLECYSTECTOMY    . DILATION AND CURETTAGE OF UTERUS    . LAPAROSCOPIC SUPRACERVICAL HYSTERECTOMY N/A 01/02/2015   Procedure: ATTEMPTED LAPAROSCOPIC SUPRACERVICAL HYSTERECTOMY CONVERTED TO OPEN AT 5625;  Surgeon: Tilda Burrow, MD;  Location: [facility];  Service: Gynecology;  Laterality: N/A;  . OVARIAN CYST REMOVAL    . SCAR REVISION N/A 06/26/2015   Procedure: ABDOMINAL SCAR REVISION;  Surgeon: Tilda Burrow, MD;  Location: [facility];  Service: Gynecology;  Laterality: N/A;  . SUPRACERVICAL ABDOMINAL HYSTERECTOMY N/A 01/02/2015   Procedure: HYSTERECTOMY SUPRACERVICAL ABDOMINAL;  Surgeon: Tilda Burrow, MD;  Location: [facility];  Service: Gynecology;  Laterality: N/A;     OB History    Gravida  7   Para  2   Term  1   Preterm  1   AB  5   Living  1     SAB  5  IAB      Ectopic      Multiple      Live Births  1           Family History  Problem Relation Age of Onset  . Ulcers Father   . Cancer Maternal Aunt        breast   . Diabetes Maternal Grandmother   . Diabetes Maternal Grandfather   . Alzheimer's disease Paternal Grandmother   . Diabetes Maternal Aunt   . Cancer - Lung Mother        lump nodule  . Cancer Mother        brain  . Cancer Paternal Aunt        cervical    Social History   Tobacco Use  . Smoking status: Current Every Day Smoker    Packs/day: 0.00    Years: 12.00    Pack years: 0.00    Types: E-cigarettes  . Smokeless tobacco: Never Used  Vaping Use  . Vaping Use: Every day  .  Substances: Flavoring  Substance Use Topics  . Alcohol use: Yes    Comment: socially  . Drug use: No    Home Medications Prior to Admission medications   Medication Sig Start Date End Date Taking? Authorizing Provider  ALPRAZolam Prudy Feeler) 0.5 MG tablet Take 0.5 mg by mouth every 8 (eight) hours as needed for anxiety.   Yes [provider]  butalbital-acetaminophen-caffeine (FIORICET) 50-325-40 MG tablet Take 1 tablet by mouth 4 (four) times daily as needed for headache or migraine. 01/20/20  Yes [provider]  furosemide (LASIX) 20 MG tablet Take 20 mg by mouth 2 (two) times daily.   Yes [provider]  cephALEXin (KEFLEX) 500 MG capsule Take 1 capsule (500 mg total) by mouth 3 (three) times daily. Patient not taking: Reported on 05/16/2021 08/08/20   Geoffery Lyons, MD  dicyclomine (BENTYL) 20 MG tablet Take 1 tablet (20 mg total) by mouth 2 (two) times daily as needed for spasms. Patient not taking: No sig reported 12/23/20   Carroll Sage, PA-C  ondansetron (ZOFRAN) 4 MG tablet Take 1 tablet (4 mg total) by mouth every 8 (eight) hours as needed for nausea or vomiting. Patient not taking: No sig reported 12/23/20   Carroll Sage, PA-C  Oxycodone HCl 10 MG TABS 1/2 tablet q 6 hours. Patient not taking: No sig reported 07/26/20   Tilda Burrow, MD  traMADol (ULTRAM) 50 MG tablet Take 1 tablet (50 mg total) by mouth every 6 (six) hours as needed. Patient not taking: No sig reported 12/05/20   Burgess Amor, PA-C  citalopram (CELEXA) 20 MG tablet Take 20 mg by mouth daily.    03/04/12  [provider]    Allergies    Coconut flavor  Review of Systems   Review of Systems  Constitutional: Positive for appetite change. Negative for chills, fatigue and fever.  HENT: Negative for sore throat and trouble swallowing.   Respiratory: Negative for cough and shortness of breath.   Cardiovascular: Negative for chest pain and palpitations.   Gastrointestinal: Positive for abdominal pain, diarrhea, nausea and vomiting. Negative for blood in stool.  Genitourinary: Negative for dysuria, flank pain, hematuria and vaginal bleeding.  Musculoskeletal: Negative for arthralgias, back pain, myalgias, neck pain and neck stiffness.  Skin: Negative for rash.  Neurological: Negative for dizziness, weakness, numbness and headaches.  Hematological: Does not bruise/bleed easily.    Physical Exam Updated Vital Signs BP Marland Kitchen)  198/94 (BP Location: Right Arm)   Pulse 68   Temp (!) 97.2 F (36.2 C) (Oral)   Resp 20   Ht 5\' 6"  (1.676 m)   Wt 120.7 kg   LMP  (LMP Unknown)   SpO2 99%   BMI 42.93 kg/m   Physical Exam Vitals and nursing note reviewed.  Constitutional:      Appearance: Normal appearance. She is well-developed. She is not ill-appearing or toxic-appearing.  HENT:     Head: Normocephalic.     Mouth/Throat:     Mouth: Mucous membranes are moist.  Neck:     Thyroid: No thyromegaly.     Meningeal: Kernig's sign absent.  Cardiovascular:     Rate and Rhythm: Normal rate and regular rhythm.     Pulses: Normal pulses.  Pulmonary:     Effort: Pulmonary effort is normal.     Breath sounds: Normal breath sounds. No wheezing.  Abdominal:     General: Bowel sounds are normal.     Palpations: Abdomen is soft.     Tenderness: There is no abdominal tenderness. There is no guarding or rebound.  Musculoskeletal:        General: Normal range of motion.     Cervical back: Normal range of motion and neck supple.  Skin:    General: Skin is warm.     Capillary Refill: Capillary refill takes less than 2 seconds.     Findings: No rash.  Neurological:     General: No focal deficit present.     Mental Status: She is alert.     Motor: No weakness.     ED Results / Procedures / Treatments   Labs (all labs ordered are listed, but only abnormal results are displayed) Labs Reviewed  COMPREHENSIVE METABOLIC PANEL - Abnormal; Notable for  the following components:      Result Value   Total Bilirubin 1.5 (*)    All other components within normal limits  URINALYSIS, ROUTINE W REFLEX MICROSCOPIC - Abnormal; Notable for the following components:   APPearance CLOUDY (*)    All other components within normal limits  CBC WITH DIFFERENTIAL/PLATELET  LIPASE, BLOOD    EKG None  Radiology No results found.  Procedures Procedures   Medications Ordered in ED Medications  promethazine (PHENERGAN) tablet 25 mg (25 mg Oral Given 05/16/21 1348)  sodium chloride 0.9 % bolus 1,000 mL (0 mLs Intravenous Stopped 05/16/21 1434)  ondansetron (ZOFRAN) injection 4 mg (4 mg Intravenous Given 05/16/21 1240)    ED Course  I have reviewed the triage vital signs and the nursing notes.  Pertinent labs & imaging results that were available during my care of the patient were reviewed by me and considered in my medical decision making (see chart for details).    MDM Rules/Calculators/A&P                          Patient here with crampy diffuse abdominal pain, nausea, vomiting, diarrhea.  Reports multiple episodes of vomiting and diarrhea today.  Recent exposure to coworkers with "stomach bug" On exam, abdomen is soft, no guarding or rebound.  Doubt acute abdominal process.  Mucous membranes are moist.  Vital signs show hypertension, no tachycardia or tachypnea.  No fever.  Will give IV fluids and check labs.  On recheck, patient resting comfortably nausea has improved after antiemetic.  She is tolerated oral fluid challenge without vomiting.  Labs unremarkable.   symptoms felt to  be viral.  She agrees to symptomatic treatment and close outpatient follow-up.  Return precautions were discussed.  The patient appears reasonably screened and/or stabilized for discharge and I doubt any other medical condition or other [facility] requiring further screening, evaluation, or treatment in the ED at this time prior to discharge.  Final Clinical Impression(s)  / ED Diagnoses Final diagnoses:  Nausea vomiting and diarrhea  Hypertension, unspecified type    Rx / DC Orders ED Discharge Orders    None       Pauline Aus, PA-C 05/16/21 1510    Vanetta Mulders, MD 05/17/21 [phone]

## 2021-06-20 DIAGNOSIS — M79671 Pain in right foot: Secondary | ICD-10-CM | POA: Diagnosis not present

## 2021-06-20 DIAGNOSIS — M79672 Pain in left foot: Secondary | ICD-10-CM | POA: Diagnosis not present

## 2021-06-20 DIAGNOSIS — M25572 Pain in left ankle and joints of left foot: Secondary | ICD-10-CM | POA: Diagnosis not present

## 2021-06-20 DIAGNOSIS — M25571 Pain in right ankle and joints of right foot: Secondary | ICD-10-CM | POA: Diagnosis not present

## 2021-07-30 DIAGNOSIS — M25561 Pain in right knee: Secondary | ICD-10-CM | POA: Diagnosis not present

## 2021-08-16 DIAGNOSIS — M25561 Pain in right knee: Secondary | ICD-10-CM | POA: Diagnosis not present

## 2021-08-27 DIAGNOSIS — M25561 Pain in right knee: Secondary | ICD-10-CM | POA: Diagnosis not present

## 2021-11-16 ENCOUNTER — Ambulatory Visit: Admit: 2021-11-16 | Payer: Medicaid Other

## 2021-11-17 ENCOUNTER — Other Ambulatory Visit: Payer: Self-pay

## 2021-11-17 ENCOUNTER — Ambulatory Visit
Admission: EM | Admit: 2021-11-17 | Discharge: 2021-11-17 | Disposition: A | Discharge status: Home / Self Care | Payer: Medicaid Other | Attending: Family Medicine | Admitting: Family Medicine

## 2021-11-17 DIAGNOSIS — J069 Acute upper respiratory infection, unspecified: Secondary | ICD-10-CM | POA: Diagnosis not present

## 2021-11-17 DIAGNOSIS — Z20822 Contact with and (suspected) exposure to covid-19: Secondary | ICD-10-CM | POA: Diagnosis not present

## 2021-11-17 DIAGNOSIS — R509 Fever, unspecified: Secondary | ICD-10-CM | POA: Diagnosis not present

## 2021-11-17 MED ORDER — OSELTAMIVIR PHOSPHATE 75 MG PO CAPS: 75.0000 mg | ORAL_CAPSULE | Freq: Two times a day (BID) | ORAL | 0 refills | Status: DC

## 2021-11-17 MED ORDER — PROMETHAZINE-DM 6.25-15 MG/5ML PO SYRP: 5.0000 mL | ORAL_SOLUTION | Freq: Four times a day (QID) | ORAL | 0 refills | Status: DC | PRN

## 2021-11-17 NOTE — ED Triage Notes (Signed)
Patient states that she is having body aches and a headache with a throat ache. It all started yesterday. Also had a fever last night at 100.9. 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  Denies Meds Denies Fever Today

## 2021-11-17 NOTE — ED Provider Notes (Signed)
[facility]    CSN: [id number] Arrival date & time: 11/17/21  0109      History   Chief Complaint No chief complaint on file.   HPI Barbara Jennings is a 40 y.o. female.   Patient presenting today with 1 day history of fever, chills, body aches, cough, congestion, sore throat, fatigue.  Denies chest pain, shortness of breath, abdominal pain, nausea vomiting or diarrhea.  States multiple sick contacts with the flu recently at work.  Has been taking over-the-counter fever reducers with mild temporary relief of symptoms.  No known pertinent past medical problems per patient.   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 02/02/2014   Had ascus with +HPV will get colpo   Abnormal Pap smear    Abnormal uterine bleeding (AUB) 10/11/2014   Anxiety    Asthma    Depression    Dysmenorrhea 10/11/2014   HSV-2 (herpes simplex virus 2) infection    Hx of migraines 02/09/2014   Has ?aura, will rx POP   Ilioinguinal neuralgia    left   Migraine    Other and unspecified ovarian cyst 02/06/2014   Ovarian cyst 10/18/2014   Panic attack    Thickened endometrium 10/18/2014   Unspecified symptom associated with female genital organs 01/27/2014   Vaginal Pap smear, abnormal    Vulvar abscess 03/09/2014   I&D 3/9 in ER at [facility] was rx'd bactrim ds x 10 days    Patient Active Problem List   Diagnosis Date Noted   Feeling grief Mother with Stage 4 cancer Dx 10/2018 12/01/2018   Morbid obesity (HCC) 06/01/2017   Entrapment, 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   Ilioinguinal neuralgia of left side 07/14/2016   Combined abdominal and pelvic pain 10/26/2015   Incisional pain s/p hysterectomy 06/26/2015   Knee pain, chronic 09/12/2014   Acute meniscal tear of knee 07/03/2014   Dysplasia of cervix, unspecified 02/16/2014   Hx of migraines 02/09/2014   Abnormal cervical Papanicolaou smear with positive human  papilloma virus (HPV) DNA test 02/02/2014   Unspecified symptom associated with female genital organs 01/27/2014   HSV-2 (herpes simplex virus 2) infection 05/26/2013    Past Surgical History:  Procedure Laterality Date   ABDOMINAL HYSTERECTOMY     APPENDECTOMY     CESAREAN SECTION     CHOLECYSTECTOMY     DILATION AND CURETTAGE OF UTERUS     LAPAROSCOPIC SUPRACERVICAL HYSTERECTOMY N/A 01/02/2015   Procedure: ATTEMPTED LAPAROSCOPIC SUPRACERVICAL HYSTERECTOMY CONVERTED TO OPEN AT 3235;  Surgeon: Tilda Burrow, MD;  Location: [facility];  Service: Gynecology;  Laterality: N/A;   OVARIAN CYST REMOVAL     SCAR REVISION N/A 06/26/2015   Procedure: ABDOMINAL SCAR REVISION;  Surgeon: Tilda Burrow, MD;  Location: [facility];  Service: Gynecology;  Laterality: N/A;   SUPRACERVICAL ABDOMINAL HYSTERECTOMY N/A 01/02/2015   Procedure: HYSTERECTOMY SUPRACERVICAL ABDOMINAL;  Surgeon: Tilda Burrow, MD;  Location: [facility];  Service: Gynecology;  Laterality: N/A;    OB History     Gravida  7   Para  2   Term  1   Preterm  1   AB  5   Living  1      SAB  5   IAB      Ectopic      Multiple      Live Births  1  Home Medications    Prior to Admission medications   Medication Sig Start Date End Date Taking? Authorizing Provider  oseltamivir (TAMIFLU) 75 MG capsule Take 1 capsule (75 mg total) by mouth every 12 (twelve) hours. 11/17/21  Yes Particia Nearing, PA-C  promethazine-dextromethorphan (PROMETHAZINE-DM) 6.25-15 MG/5ML syrup Take 5 mLs by mouth 4 (four) times daily as needed. 11/17/21  Yes Particia Nearing, PA-C  ALPRAZolam Prudy Feeler) 0.5 MG tablet Take 0.5 mg by mouth every 8 (eight) hours as needed for anxiety.    [provider]  butalbital-acetaminophen-caffeine (FIORICET) 50-325-40 MG tablet Take 1 tablet by mouth 4 (four) times daily as needed for headache or migraine. 01/20/20   [provider]  cephALEXin (KEFLEX) 500 MG capsule Take 1  capsule (500 mg total) by mouth 3 (three) times daily. Patient not taking: Reported on 05/16/2021 08/08/20   Geoffery Lyons, MD  dicyclomine (BENTYL) 20 MG tablet Take 1 tablet (20 mg total) by mouth 2 (two) times daily as needed for spasms. Patient not taking: Reported on 05/16/2021 12/23/20   Carroll Sage, PA-C  furosemide (LASIX) 20 MG tablet Take 20 mg by mouth 2 (two) times daily.    [provider]  ondansetron (ZOFRAN) 4 MG tablet Take 1 tablet (4 mg total) by mouth every 6 (six) hours. As needed for nausea vomiting 05/16/21   Triplett, Tammy, PA-C  Oxycodone HCl 10 MG TABS 1/2 tablet q 6 hours. Patient not taking: Reported on 05/16/2021 07/26/20   Tilda Burrow, MD  traMADol (ULTRAM) 50 MG tablet Take 1 tablet (50 mg total) by mouth every 6 (six) hours as needed. Patient not taking: Reported on 05/16/2021 12/05/20   Burgess Amor, PA-C  citalopram (CELEXA) 20 MG tablet Take 20 mg by mouth daily.    03/04/12  [provider]    Family History Family History  Problem Relation Age of Onset   Ulcers Father    Cancer Maternal Aunt        breast    Diabetes Maternal Grandmother    Diabetes Maternal Grandfather    Alzheimer's disease Paternal Grandmother    Diabetes Maternal Aunt    Cancer - Lung Mother        lump nodule   Cancer Mother        brain   Cancer Paternal Aunt        cervical    Social History Social History   Tobacco Use   Smoking status: Every Day    Packs/day: 0.00    Years: 12.00    Pack years: 0.00    Types: E-cigarettes, Cigarettes   Smokeless tobacco: Never  Vaping Use   Vaping Use: Every day   Substances: Flavoring  Substance Use Topics   Alcohol use: Yes    Comment: socially   Drug use: No     Allergies   Coconut flavor   Review of Systems Review of Systems Per HPI  Physical Exam Triage Vital Signs ED Triage Vitals  Enc Vitals Group     BP 11/17/21 1021 106/68     Pulse Rate 11/17/21 1021 66     Resp 11/17/21 1021  16     Temp 11/17/21 1021 98.3 F (36.8 C)     Temp Source 11/17/21 1021 Oral     SpO2 11/17/21 1021 96 %     Weight --      Height --      Head Circumference --      Peak  Flow --      Pain Score 11/17/21 1018 8     Pain Loc --      Pain Edu? --      Excl. in GC? --    No data found.  Updated Vital Signs BP 106/68 (BP Location: Right Arm)   Pulse 66   Temp 98.3 F (36.8 C) (Oral)   Resp 16   LMP  (LMP Unknown)   SpO2 96%   Visual Acuity Right Eye Distance:   Left Eye Distance:   Bilateral Distance:    Right Eye Near:   Left Eye Near:    Bilateral Near:     Physical Exam Vitals and nursing note reviewed.  Constitutional:      Appearance: Normal appearance.  HENT:     Head: Atraumatic.     Right Ear: Tympanic membrane and external ear normal.     Left Ear: Tympanic membrane and external ear normal.     Nose: Rhinorrhea present.     Mouth/Throat:     Mouth: Mucous membranes are moist.     Pharynx: Posterior oropharyngeal erythema present.  Eyes:     Extraocular Movements: Extraocular movements intact.     Conjunctiva/sclera: Conjunctivae normal.  Cardiovascular:     Rate and Rhythm: Normal rate and regular rhythm.     Heart sounds: Normal heart sounds.  Pulmonary:     Effort: Pulmonary effort is normal.     Breath sounds: Normal breath sounds. No wheezing.  Musculoskeletal:        General: Normal range of motion.     Cervical back: Normal range of motion and neck supple.  Skin:    General: Skin is warm and dry.  Neurological:     Mental Status: She is alert and oriented to person, place, and time.  Psychiatric:        Mood and Affect: Mood normal.        Thought Content: Thought content normal.     UC Treatments / Results  Labs (all labs ordered are listed, but only abnormal results are displayed) Labs Reviewed  COVID-19, FLU A+B NAA    EKG   Radiology No results found.  Procedures Procedures (including critical care time)  Medications  Ordered in UC Medications - No data to display  Initial Impression / Assessment and Plan / UC Course  I have reviewed the triage vital signs and the nursing notes.  Pertinent labs & imaging results that were available during my care of the patient were reviewed by me and considered in my medical decision making (see chart for details).     Vitals and exam overall reassuring, suspect viral upper respiratory infection likely influenza given symptoms and community exposures.  We will start Tamiflu proactively while waiting COVID and flu results.  Phenergan DM, over-the-counter fever reducers and supportive home care.  Return for acutely worsening symptoms.  Work note given.  Final Clinical Impressions(s) / UC Diagnoses   Final diagnoses:  Viral URI with cough  Fever, unspecified   Discharge Instructions   None    ED Prescriptions     Medication Sig Dispense Auth. Provider   oseltamivir (TAMIFLU) 75 MG capsule Take 1 capsule (75 mg total) by mouth every 12 (twelve) hours. 10 capsule Particia Nearing, [place]   promethazine-dextromethorphan (PROMETHAZINE-DM) 6.25-15 MG/5ML syrup Take 5 mLs by mouth 4 (four) times daily as needed. 100 mL Particia Nearing, [place]      PDMP not reviewed this encounter.  Particia Nearing, [place] 11/17/21 1130

## 2021-11-18 LAB — COVID-19, FLU A+B NAA
Influenza A, NAA: NOT DETECTED
Influenza B, NAA: NOT DETECTED
SARS-CoV-2, NAA: NOT DETECTED

## 2021-12-09 DIAGNOSIS — F419 Anxiety disorder, unspecified: Secondary | ICD-10-CM | POA: Diagnosis not present

## 2022-01-06 DIAGNOSIS — R631 Polydipsia: Secondary | ICD-10-CM | POA: Diagnosis not present

## 2022-01-06 DIAGNOSIS — R600 Localized edema: Secondary | ICD-10-CM | POA: Diagnosis not present

## 2022-01-06 DIAGNOSIS — Z6841 Body Mass Index (BMI) 40.0 and over, adult: Secondary | ICD-10-CM | POA: Diagnosis not present

## 2022-01-06 DIAGNOSIS — R350 Frequency of micturition: Secondary | ICD-10-CM | POA: Diagnosis not present

## 2022-03-19 DIAGNOSIS — J0110 Acute frontal sinusitis, unspecified: Secondary | ICD-10-CM | POA: Diagnosis not present

## 2022-03-19 DIAGNOSIS — F419 Anxiety disorder, unspecified: Secondary | ICD-10-CM | POA: Diagnosis not present

## 2022-06-24 DIAGNOSIS — I872 Venous insufficiency (chronic) (peripheral): Secondary | ICD-10-CM | POA: Diagnosis not present

## 2022-06-24 DIAGNOSIS — Z6841 Body Mass Index (BMI) 40.0 and over, adult: Secondary | ICD-10-CM | POA: Diagnosis not present

## 2022-06-24 DIAGNOSIS — R600 Localized edema: Secondary | ICD-10-CM | POA: Diagnosis not present

## 2022-06-24 DIAGNOSIS — R631 Polydipsia: Secondary | ICD-10-CM | POA: Diagnosis not present

## 2022-06-24 DIAGNOSIS — J0110 Acute frontal sinusitis, unspecified: Secondary | ICD-10-CM | POA: Diagnosis not present

## 2022-06-24 DIAGNOSIS — R350 Frequency of micturition: Secondary | ICD-10-CM | POA: Diagnosis not present

## 2022-06-24 DIAGNOSIS — G43909 Migraine, unspecified, not intractable, without status migrainosus: Secondary | ICD-10-CM | POA: Diagnosis not present

## 2022-06-24 DIAGNOSIS — M79604 Pain in right leg: Secondary | ICD-10-CM | POA: Diagnosis not present

## 2022-06-24 DIAGNOSIS — G894 Chronic pain syndrome: Secondary | ICD-10-CM | POA: Diagnosis not present

## 2022-06-24 DIAGNOSIS — F419 Anxiety disorder, unspecified: Secondary | ICD-10-CM | POA: Diagnosis not present

## 2022-06-25 ENCOUNTER — Other Ambulatory Visit (HOSPITAL_COMMUNITY): Payer: Self-pay | Admitting: Internal Medicine

## 2022-06-25 ENCOUNTER — Other Ambulatory Visit: Payer: Self-pay | Admitting: Internal Medicine

## 2022-06-25 DIAGNOSIS — M79604 Pain in right leg: Secondary | ICD-10-CM

## 2022-07-09 ENCOUNTER — Ambulatory Visit (HOSPITAL_COMMUNITY)
Admission: RE | Admit: 2022-07-09 | Discharge: 2022-07-09 | Disposition: A | Discharge status: Home / Self Care | Payer: Medicaid Other | Source: Ambulatory Visit | Attending: Internal Medicine | Admitting: Internal Medicine

## 2022-07-09 DIAGNOSIS — M79604 Pain in right leg: Secondary | ICD-10-CM | POA: Diagnosis not present

## 2022-07-09 DIAGNOSIS — M79605 Pain in left leg: Secondary | ICD-10-CM | POA: Insufficient documentation

## 2022-08-05 IMAGING — DX DG ANKLE COMPLETE 3+V*R*
3 series · 3 of 3 positions shown · non-contrast
Comparison: Right ankle radiographs 09/10/2015

CLINICAL DATA: Pt c/o bilateral ankle pain x 7-8 months with NKI.
Hurts to bear weight on both feet.

EXAM:
RIGHT ANKLE - COMPLETE 3+ VIEW

[ankle ap]
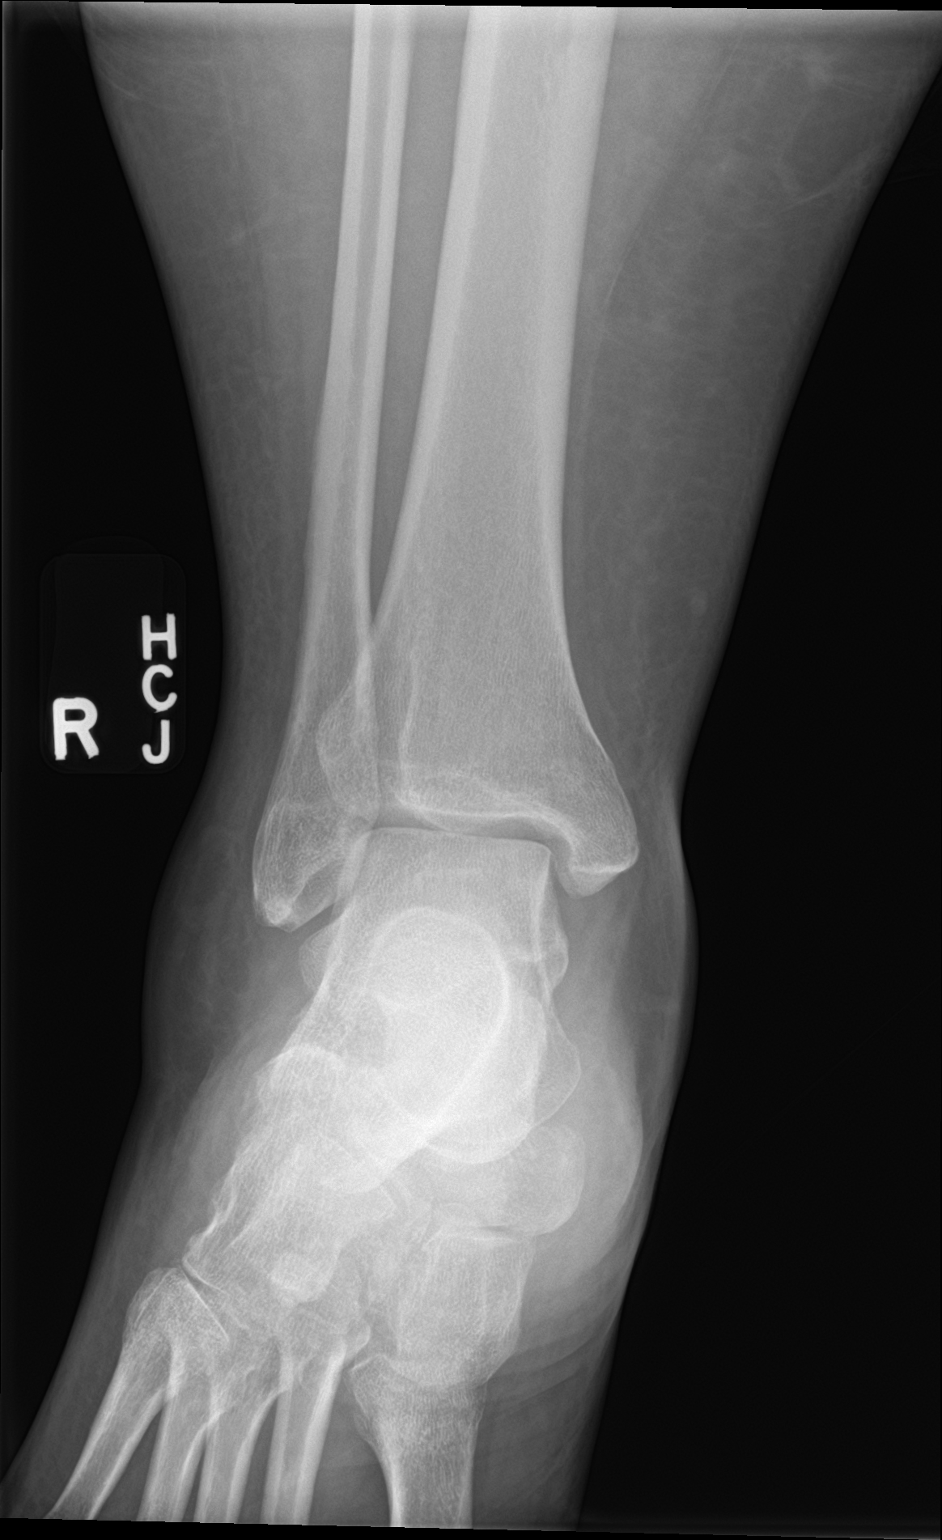

[ankle obl]
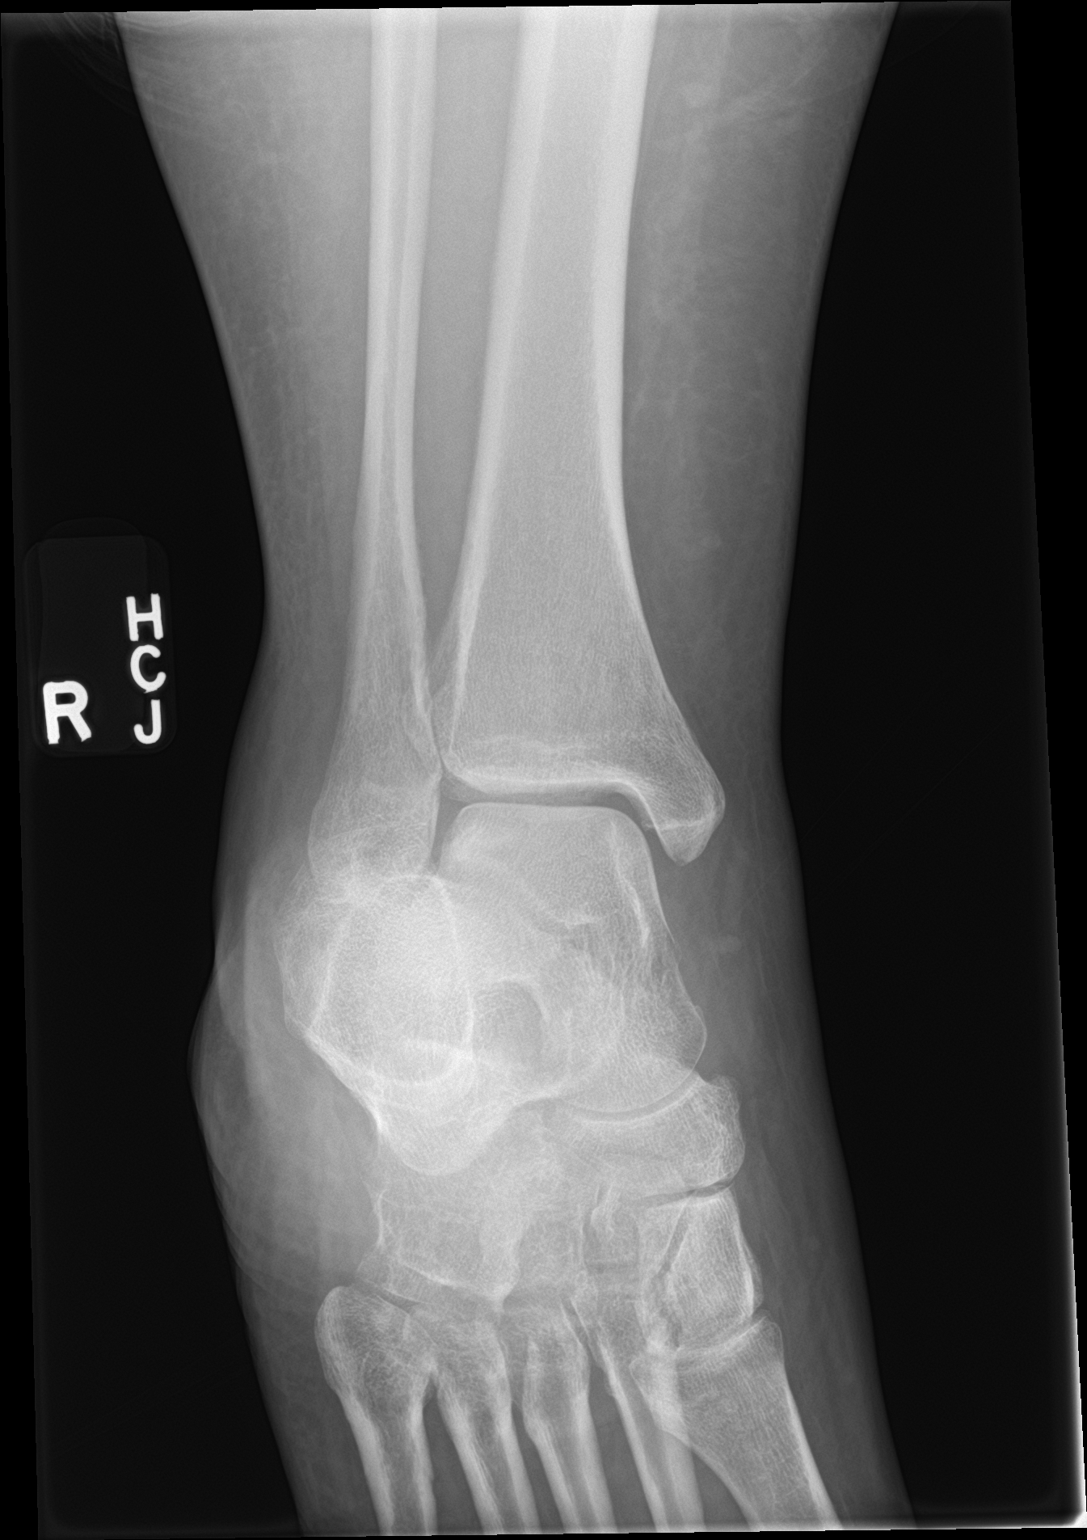

[ankle lat]
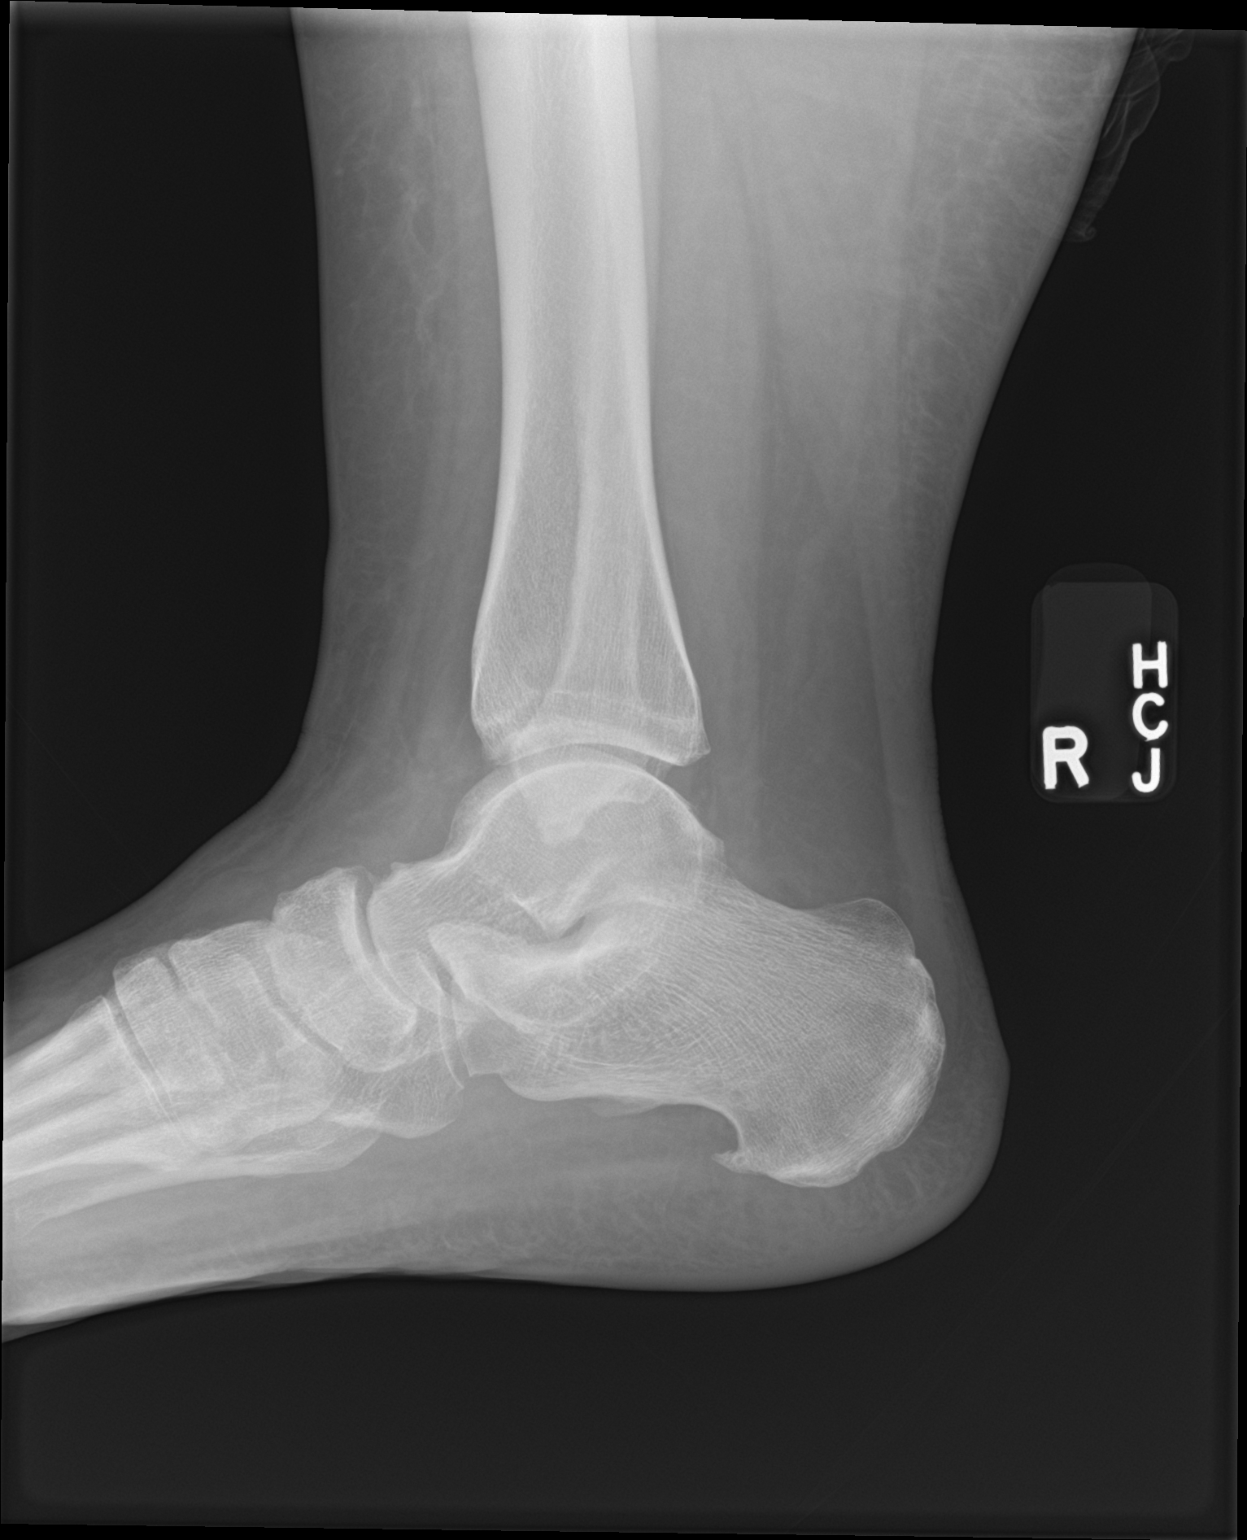

[3 of 3 positions shown; findings below may reference images not displayed]

FINDINGS: There is no evidence of fracture, dislocation, or joint effusion.
There is no evidence of arthropathy or other focal bone abnormality.
Tiny inferior calcaneal spur. Soft tissues are unremarkable.
IMPRESSION: No radiographic finding to explain the patient's pain.

## 2022-10-21 DIAGNOSIS — F419 Anxiety disorder, unspecified: Secondary | ICD-10-CM | POA: Diagnosis not present

## 2022-10-21 DIAGNOSIS — E2749 Other adrenocortical insufficiency: Secondary | ICD-10-CM | POA: Diagnosis not present

## 2022-10-21 DIAGNOSIS — G43909 Migraine, unspecified, not intractable, without status migrainosus: Secondary | ICD-10-CM | POA: Diagnosis not present

## 2023-01-08 DIAGNOSIS — J00 Acute nasopharyngitis [common cold]: Secondary | ICD-10-CM | POA: Diagnosis not present

## 2023-02-17 ENCOUNTER — Encounter: Payer: Self-pay | Admitting: Family Medicine

## 2023-02-17 ENCOUNTER — Ambulatory Visit: Payer: Medicaid Other | Admitting: Family Medicine

## 2023-02-17 VITALS — BP 132/72 | HR 62 | Temp 97.8°F | Ht 66.0 in | Wt 315.8 lb

## 2023-02-17 DIAGNOSIS — R600 Localized edema: Secondary | ICD-10-CM

## 2023-02-17 DIAGNOSIS — R29898 Other symptoms and signs involving the musculoskeletal system: Secondary | ICD-10-CM

## 2023-02-17 DIAGNOSIS — I890 Lymphedema, not elsewhere classified: Secondary | ICD-10-CM

## 2023-02-17 DIAGNOSIS — Z809 Family history of malignant neoplasm, unspecified: Secondary | ICD-10-CM

## 2023-02-17 DIAGNOSIS — I872 Venous insufficiency (chronic) (peripheral): Secondary | ICD-10-CM

## 2023-02-17 MED ORDER — DULOXETINE HCL 20 MG PO CPEP: 20.0000 mg | ORAL_CAPSULE | Freq: Two times a day (BID) | ORAL | 1 refills | Status: DC

## 2023-02-17 NOTE — Progress Notes (Signed)
Subjective:  Patient ID: Barbara Jennings, female    DOB: 06-03-1981, 42 y.o.   MRN: [id number]  Patient Care Team: Baruch Gouty, FNP as PCP - General (Family Medicine)   Chief Complaint:  New Patient (Initial Visit) [facility] medical /), Establish Care, and Edema (Bilateral lower legs x 1 year)   HPI: Barbara Jennings is a 43 y.o. female presenting on 02/17/2023 for New Patient (Initial Visit) ([place] /), Establish Care, and Edema (Bilateral lower legs x 1 year)   Pt presents today to establish care with new PCP and for evaluation of ongoing BLE swelling with pain and fatigue for the last year. Prior medical records have been requested. She reports a history of partial hysterectomy for DUB, one ovary remains. She states she has had the swelling in her lower legs for the last year. Has tried diuretics without relief of swelling. Has not been able to tolerate compression stockings. States US evaluating for DVT was negative. No other workup for symptoms. Strong family history of cancer and pt has had abnormal PAPs in the past. She states her legs are painful all of the time and feel very tired as the day progresses. No chest pain, shortness or breath, orthopnea, PND, palpitations, or syncope. No changes in urine output. Has not been evaluated by the lymphedema clinic.     Relevant past medical, surgical, family, and social history reviewed and updated as indicated.  Allergies and medications reviewed and updated. Data reviewed: Chart in Epic.   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 02/02/2014   Had ascus with +HPV will get colpo   Abnormal Pap smear    Abnormal uterine bleeding (AUB) 10/11/2014   Anxiety    Asthma    Depression    Dysmenorrhea 10/11/2014   Headache    HSV-2 (herpes simplex virus 2) infection    Hx of migraines 02/09/2014   Has ?aura, will rx POP   Ilioinguinal neuralgia     left   Meningitis    Migraine    Other and unspecified ovarian cyst 02/06/2014   Ovarian cyst 10/18/2014   Panic attack    Thickened endometrium 10/18/2014   Unspecified symptom associated with female genital organs 01/27/2014   Vaginal Pap smear, abnormal    Vulvar abscess 03/09/2014   I&D 3/9 in ER [facility] was rx'd bactrim ds x 10 days    Past Surgical History:  Procedure Laterality Date   ABDOMINAL HYSTERECTOMY     APPENDECTOMY     CESAREAN SECTION     CHOLECYSTECTOMY     DILATION AND CURETTAGE OF UTERUS     LAPAROSCOPIC SUPRACERVICAL HYSTERECTOMY N/A 01/02/2015   Procedure: ATTEMPTED LAPAROSCOPIC SUPRACERVICAL HYSTERECTOMY CONVERTED TO OPEN AT [id number];  Surgeon: Jonnie Kind, MD;  Location: [facility];  Service: Gynecology;  Laterality: N/A;   OVARIAN CYST REMOVAL     SCAR REVISION N/A 06/26/2015   Procedure: ABDOMINAL SCAR REVISION;  Surgeon: Jonnie Kind, MD;  Location: [facility];  Service: Gynecology;  Laterality: N/A;   SUPRACERVICAL ABDOMINAL HYSTERECTOMY N/A 01/02/2015   Procedure: HYSTERECTOMY SUPRACERVICAL ABDOMINAL;  Surgeon: Jonnie Kind, MD;  Location: [facility];  Service: Gynecology;  Laterality: N/A;    Social History   Socioeconomic History   Marital status: Divorced    Spouse name: Not on file   Number of children: 1   Years of education: Not on  file   Highest education level: Not on file  Occupational History   Not on file  Tobacco Use   Smoking status: Former    Packs/day: 0.00    Years: 12.00    Total pack years: 0.00    Types: E-cigarettes, Cigarettes    Quit date: 2010    Years since quitting: 14.1   Smokeless tobacco: Never  Vaping Use   Vaping Use: Every day   Substances: Flavoring  Substance and Sexual Activity   Alcohol use: Yes    Comment: socially   Drug use: No   Sexual activity: Not Currently    Partners: Male    Birth control/protection: Surgical    Comment: hyst  Other Topics Concern   Not on file  Social History Narrative    Not on file   Social Determinants of Health   Financial Resource Strain: Not on file  Food Insecurity: Not on file  Transportation Needs: Not on file  Physical Activity: Not on file  Stress: Not on file  Social Connections: Not on file  Intimate Partner Violence: Not on file    Outpatient Encounter Medications as of 02/17/2023  Medication Sig   ALPRAZolam (XANAX) 0.5 MG tablet Take 0.5 mg by mouth every 8 (eight) hours as needed for anxiety.   butalbital-acetaminophen-caffeine (FIORICET) 50-325-40 MG tablet Take 1 tablet by mouth 4 (four) times daily as needed for headache or migraine.   furosemide (LASIX) 20 MG tablet Take 20 mg by mouth 4 (four) times daily.   oseltamivir (TAMIFLU) 75 MG capsule Take 1 capsule (75 mg total) by mouth every 12 (twelve) hours.   OVER THE COUNTER MEDICATION Womens daily vit   OVER THE COUNTER MEDICATION Hair, skin & nails   OVER THE COUNTER MEDICATION Glucosamine econdrodin   rosuvastatin (CRESTOR) 10 MG tablet Take 10 mg by mouth daily.   Turmeric (QC TUMERIC COMPLEX PO) Take by mouth.   VENTOLIN HFA 108 (90 Base) MCG/ACT inhaler Inhale 1-2 puffs into the lungs every 4 (four) hours as needed.   Vitamin D, Ergocalciferol, (DRISDOL) 1.25 MG (50000 UNIT) CAPS capsule Take 50,000 Units by mouth once a week.   [DISCONTINUED] DULoxetine (CYMBALTA) 20 MG capsule Take 20 mg by mouth 2 (two) times daily.   DULoxetine (CYMBALTA) 20 MG capsule Take 1 capsule (20 mg total) by mouth 2 (two) times daily.   [DISCONTINUED] cephALEXin (KEFLEX) 500 MG capsule Take 1 capsule (500 mg total) by mouth 3 (three) times daily. (Patient not taking: Reported on 05/16/2021)   [DISCONTINUED] citalopram (CELEXA) 20 MG tablet Take 20 mg by mouth daily.     [DISCONTINUED] dicyclomine (BENTYL) 20 MG tablet Take 1 tablet (20 mg total) by mouth 2 (two) times daily as needed for spasms. (Patient not taking: Reported on 05/16/2021)   [DISCONTINUED] ondansetron (ZOFRAN) 4 MG tablet Take 1  tablet (4 mg total) by mouth every 6 (six) hours. As needed for nausea vomiting   [DISCONTINUED] Oxycodone HCl 10 MG TABS 1/2 tablet q 6 hours. (Patient not taking: Reported on 05/16/2021)   [DISCONTINUED] promethazine-dextromethorphan (PROMETHAZINE-DM) 6.25-15 MG/5ML syrup Take 5 mLs by mouth 4 (four) times daily as needed.   [DISCONTINUED] traMADol (ULTRAM) 50 MG tablet Take 1 tablet (50 mg total) by mouth every 6 (six) hours as needed. (Patient not taking: Reported on 05/16/2021)   No facility-administered encounter medications on file as of 02/17/2023.    Allergies  Allergen Reactions   Coconut Flavor Anaphylaxis    Review of Systems  Constitutional:  Positive for activity change. Negative for appetite change, chills, diaphoresis, fatigue, fever and unexpected weight change.  HENT: Negative.    Eyes: Negative.  Negative for photophobia and visual disturbance.  Respiratory:  Negative for cough, chest tightness and shortness of breath.   Cardiovascular:  Positive for leg swelling. Negative for chest pain and palpitations.  Gastrointestinal:  Negative for abdominal pain, blood in stool, constipation, diarrhea, nausea and vomiting.  Endocrine: Negative.  Negative for polydipsia, polyphagia and polyuria.  Genitourinary:  Negative for decreased urine volume, difficulty urinating, dysuria, frequency and urgency.  Musculoskeletal:  Positive for arthralgias and myalgias.  Skin: Negative.   Allergic/Immunologic: Negative.   Neurological:  Negative for dizziness, tremors, seizures, syncope, facial asymmetry, speech difficulty, weakness, light-headedness, numbness and headaches.  Hematological: Negative.   Psychiatric/Behavioral:  Negative for confusion, hallucinations, sleep disturbance and suicidal ideas.   All other systems reviewed and are negative.       Objective:  BP 132/72   Pulse 62   Temp 97.8 F (36.6 C) (Temporal)   Ht 5' 6"$  (1.676 m)   Wt (!) 315 lb 12.8 oz (143.2 kg)   LMP   (LMP Unknown)   SpO2 98%   BMI 50.97 kg/m    Wt Readings from Last 3 Encounters:  02/17/23 (!) 315 lb 12.8 oz (143.2 kg)  05/16/21 266 lb (120.7 kg)  12/30/20 296 lb (134.3 kg)    Physical Exam Vitals and nursing note reviewed.  Constitutional:      General: She is not in acute distress.    Appearance: Normal appearance. She is morbidly obese. She is not ill-appearing, toxic-appearing or diaphoretic.  HENT:     Head: Normocephalic and atraumatic.     Mouth/Throat:     Mouth: Mucous membranes are moist.  Eyes:     Conjunctiva/sclera: Conjunctivae normal.     Pupils: Pupils are equal, round, and reactive to light.  Cardiovascular:     Rate and Rhythm: Normal rate and regular rhythm.     Heart sounds: Normal heart sounds.     Comments: BLE varicose veins to lower legs, not ruptured Pulmonary:     Effort: Pulmonary effort is normal.     Breath sounds: Normal breath sounds.  Musculoskeletal:     Right lower leg: 4+ Edema present.     Left lower leg: 4+ Edema present.  Skin:    General: Skin is warm and dry.     Capillary Refill: Capillary refill takes less than 2 seconds.  Neurological:     General: No focal deficit present.     Mental Status: She is alert and oriented to person, place, and time.  Psychiatric:        Mood and Affect: Mood normal.        Behavior: Behavior normal.        Thought Content: Thought content normal.        Judgment: Judgment normal.     Results for orders placed or performed during the hospital encounter of 11/17/21  Covid-19, Flu A+B ([facility])   Specimen: Nasopharyngeal Swab   Naso  Result Value Ref Range   SARS-CoV-2, NAA Not Detected Not Detected   Influenza A, NAA Not Detected Not Detected   Influenza B, NAA Not Detected Not Detected   Test Information: Comment        Pertinent labs & imaging results that were available during my care of the patient were reviewed by me and considered in my medical decision making.  Assessment &  Plan:  Alonnah was seen today for new patient (initial visit), establish care and edema.  Diagnoses and all orders for this visit:  Bilateral lower extremity edema Leg fatigue Lymphedema Family history of cancer Ongoing for a year or more. Prior records have been requested. Will check baseline labs along with CA 125 as pt has a strong family history of cancer and a personal history of ovarian cyst and abnormal PAPs in the past. Will check for venous insufficiency and refer to lymphedema clinic. Compression hose use encouraged. If work-up is unremarkable, will consider CT abdomen/pelvis to evaluate for potential underlying causes of lymphedema such an obstructing tumor. Pt aware of plans.  -     CMP14+EGFR -     CBC with Differential/Platelet -     Brain natriuretic peptide -     CA 125 -     Thyroid Panel With TSH -     VAS [place] LOWER EXTREMITY VENOUS REFLUX; Future -     Ambulatory referral to Physical Therapy   Morbid obesity ([place]) Diet and exercise encouraged. Labs pending. -     CMP14+EGFR -     CBC with Differential/Platelet -     Thyroid Panel With TSH -     Hemoglobin A1c    Continue all other maintenance medications.  Follow up plan: Return in about 4 weeks (around 03/17/2023) for edema.   Continue healthy lifestyle choices, including diet (rich in fruits, vegetables, and lean proteins, and low in salt and simple carbohydrates) and exercise (at least 30 minutes of moderate physical activity daily).  Educational handout given for lymphedema   The above assessment and management plan was discussed with the patient. The patient verbalized understanding of and has agreed to the management plan. Patient is aware to call the clinic if they develop any new symptoms or if symptoms persist or worsen. Patient is aware when to return to the clinic for a follow-up visit. Patient educated on when it is appropriate to go to the emergency department.   Monia Pouch, FNP-C [facility] [phone]

## 2023-02-18 ENCOUNTER — Encounter: Payer: Self-pay | Admitting: Pharmacist

## 2023-02-18 LAB — CBC WITH DIFFERENTIAL/PLATELET
Basophils Absolute: 0.1 10*3/uL (ref 0.0–0.2)
Basos: 1 %
EOS (ABSOLUTE): 0.4 10*3/uL (ref 0.0–0.4)
Eos: 6 %
Hematocrit: 37 % (ref 34.0–46.6)
Hemoglobin: 12.2 g/dL (ref 11.1–15.9)
Immature Grans (Abs): 0 10*3/uL (ref 0.0–0.1)
Immature Granulocytes: 1 %
Lymphocytes Absolute: 1.6 10*3/uL (ref 0.7–3.1)
Lymphs: 26 %
MCH: 27 pg (ref 26.6–33.0)
MCHC: 33 g/dL (ref 31.5–35.7)
MCV: 82 fL (ref 79–97)
Monocytes Absolute: 0.5 10*3/uL (ref 0.1–0.9)
Monocytes: 7 %
Neutrophils Absolute: 3.7 10*3/uL (ref 1.4–7.0)
Neutrophils: 59 %
Platelets: 288 10*3/uL (ref 150–450)
RBC: 4.52 x10E6/uL (ref 3.77–5.28)
RDW: 13.8 % (ref 11.7–15.4)
WBC: 6.2 10*3/uL (ref 3.4–10.8)

## 2023-02-18 LAB — CMP14+EGFR
ALT: 49 IU/L — ABNORMAL HIGH (ref 0–32)
AST: 34 IU/L (ref 0–40)
Albumin/Globulin Ratio: 1.6 (ref 1.2–2.2)
Albumin: 4.5 g/dL (ref 3.9–4.9)
Alkaline Phosphatase: 73 IU/L (ref 44–121)
BUN/Creatinine Ratio: 18 (ref 9–23)
BUN: 9 mg/dL (ref 6–24)
Bilirubin Total: 0.5 mg/dL (ref 0.0–1.2)
CO2: 25 mmol/L (ref 20–29)
Calcium: 9.7 mg/dL (ref 8.7–10.2)
Chloride: 98 mmol/L (ref 96–106)
Creatinine, Ser: 0.51 mg/dL — ABNORMAL LOW (ref 0.57–1.00)
Globulin, Total: 2.9 g/dL (ref 1.5–4.5)
Glucose: 90 mg/dL (ref 70–99)
Potassium: 4.7 mmol/L (ref 3.5–5.2)
Sodium: 137 mmol/L (ref 134–144)
Total Protein: 7.4 g/dL (ref 6.0–8.5)
eGFR: 120 mL/min/{1.73_m2} (ref >59)

## 2023-02-18 LAB — CA 125: Cancer Antigen (CA) 125: 4.5 U/mL (ref 0.0–38.1)

## 2023-02-18 LAB — BRAIN NATRIURETIC PEPTIDE: BNP: 11.1 pg/mL (ref 0.0–100.0)

## 2023-02-18 LAB — THYROID PANEL WITH TSH
Free Thyroxine Index: 2.7 (ref 1.2–4.9)
T3 Uptake Ratio: 25 % (ref 24–39)
T4, Total: 10.9 ug/dL (ref 4.5–12.0)
TSH: 2.8 u[IU]/mL (ref 0.450–4.500)

## 2023-02-18 NOTE — Progress Notes (Signed)
    02/18/2023 Name: UNDINE VANEATON MRN: [id number] DOB: [birth year]   S:  56 yoF presents for metabolic syndrome evaluation, education, and management Patient was referred and last seen by Primary Care Provider on 02/17/23. Patient is interested in pharmacotherapy for metabolic syndrome and weight loss.  She is currently 315lbs and struggles with PCOS.  She also has a significant family cardiac history.  Insurance coverage/medication affordability: Medicaid  Patient-reported exercise habits: encouraged; dealing with lower leg edema    O:  Labs all pending BP 132/77  Clinical Atherosclerotic Cardiovascular Disease (ASCVD): No   The ASCVD Risk score (Arnett DK, et al., 2019) failed to calculate for the following reasons:   Cannot find a previous HDL lab   Cannot find a previous total cholesterol lab    A/P:  Awaiting labs and A1c  Patient interested in starting GLP1 for weight loss and metabolic syndrome.  Sample of semaglutide was given.  Denies personal and family history of Medullary thyroid cancer (MTC).  Will attempt to get it covered on insurance pending A1c vs prescribing GLP1 for weight loss. Will follow up to prescribed GLP1 per pending diagnoses.  Discussed with PCP     Written patient instructions provided.  Total time in face to face counseling 20 minutes.     Regina Eck, PharmD, BCPS, BCACP Clinical Pharmacist, [place]  II  T [phone]

## 2023-02-18 NOTE — Addendum Note (Signed)
Addended by: Baruch Gouty on: 02/18/2023 11:07 AM   Modules accepted: Orders

## 2023-02-20 LAB — HGB A1C W/O EAG: Hgb A1c MFr Bld: 5.6 % (ref 4.8–5.6)

## 2023-02-20 LAB — SPECIMEN STATUS REPORT

## 2023-02-27 ENCOUNTER — Encounter: Payer: Self-pay | Admitting: Family Medicine

## 2023-03-02 ENCOUNTER — Encounter: Payer: Self-pay | Admitting: Family Medicine

## 2023-03-05 ENCOUNTER — Telehealth: Payer: Self-pay | Admitting: Pharmacist

## 2023-03-05 ENCOUNTER — Encounter: Payer: Self-pay | Admitting: Family Medicine

## 2023-03-05 ENCOUNTER — Telehealth: Payer: Medicaid Other | Admitting: Family Medicine

## 2023-03-05 DIAGNOSIS — F411 Generalized anxiety disorder: Secondary | ICD-10-CM

## 2023-03-05 MED ORDER — ONDANSETRON HCL 4 MG PO TABS: 4.0000 mg | ORAL_TABLET | Freq: Three times a day (TID) | ORAL | 0 refills | Status: DC | PRN

## 2023-03-05 MED ORDER — BUSPIRONE HCL 7.5 MG PO TABS: 7.5000 mg | ORAL_TABLET | Freq: Two times a day (BID) | ORAL | 3 refills | Status: DC

## 2023-03-05 NOTE — Telephone Encounter (Signed)
Nausea with semaglutide Will send in zofran

## 2023-03-05 NOTE — Progress Notes (Signed)
Virtual Visit via MyChart Video Note Due to COVID-19 pandemic this visit was conducted virtually. This visit type was conducted due to national recommendations for restrictions regarding the COVID-19 Pandemic (e.g. social distancing, sheltering in place) in an effort to limit this patient's exposure and mitigate transmission in our community. All issues noted in this document were discussed and addressed.  A physical exam was not performed with this format.   I connected with Barbara Jennings on 03/05/2023 at 0830 by MyChart Video and verified that I am speaking with the correct person using two identifiers. SELIA MCELMURRY is currently located at work and patient is currently with them during visit. The provider, Monia Pouch, FNP is located in their office at time of visit.  I discussed the limitations, risks, security and privacy concerns of performing an evaluation and management service by virtual visit and the availability of in person appointments. I also discussed with the patient that there may be a patient responsible charge related to this service. The patient expressed understanding and agreed to proceed.  Subjective:  Patient ID: Barbara Jennings, female    DOB: 1981/12/19, 42 y.o.   MRN: [id number]  Chief Complaint:  Anxiety   HPI: Barbara Jennings is a 42 y.o. female presenting on 03/05/2023 for Anxiety   Pt presents today for ongoing anxiety. She was previously on Xanax as needed for anxiety. She would like something for as needed anxiety. She is on Cymbalta and reports she takes this seasonally. Educated on proper dosing of this medication.      Relevant past medical, surgical, family, and social history reviewed and updated as indicated.  Allergies and medications reviewed and updated.   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 02/02/2014   Had ascus with +HPV will get colpo   Abnormal Pap  smear    Abnormal uterine bleeding (AUB) 10/11/2014   Anxiety    Asthma    Depression    Dysmenorrhea 10/11/2014   Headache    HSV-2 (herpes simplex virus 2) infection    Hx of migraines 02/09/2014   Has ?aura, will rx POP   Ilioinguinal neuralgia    left   Meningitis    Migraine    Other and unspecified ovarian cyst 02/06/2014   Ovarian cyst 10/18/2014   Panic attack    Thickened endometrium 10/18/2014   Unspecified symptom associated with female genital organs 01/27/2014   Vaginal Pap smear, abnormal    Vulvar abscess 03/09/2014   I&D 3/9 in ER at [facility] was rx'd bactrim ds x 10 days    Past Surgical History:  Procedure Laterality Date   ABDOMINAL HYSTERECTOMY     APPENDECTOMY     CESAREAN SECTION     CHOLECYSTECTOMY     DILATION AND CURETTAGE OF UTERUS     LAPAROSCOPIC SUPRACERVICAL HYSTERECTOMY N/A 01/02/2015   Procedure: ATTEMPTED LAPAROSCOPIC SUPRACERVICAL HYSTERECTOMY CONVERTED TO OPEN AT [id number];  Surgeon: Jonnie Kind, MD;  Location: [facility];  Service: Gynecology;  Laterality: N/A;   OVARIAN CYST REMOVAL     SCAR REVISION N/A 06/26/2015   Procedure: ABDOMINAL SCAR REVISION;  Surgeon: Jonnie Kind, MD;  Location: [facility];  Service: Gynecology;  Laterality: N/A;   SUPRACERVICAL ABDOMINAL HYSTERECTOMY N/A 01/02/2015   Procedure: HYSTERECTOMY SUPRACERVICAL ABDOMINAL;  Surgeon: Jonnie Kind, MD;  Location: [facility];  Service: Gynecology;  Laterality: N/A;    Social History  Socioeconomic History   Marital status: Divorced    Spouse name: Not on file   Number of children: 1   Years of education: Not on file   Highest education level: Not on file  Occupational History   Not on file  Tobacco Use   Smoking status: Former    Packs/day: 0.00    Years: 12.00    Total pack years: 0.00    Types: E-cigarettes, Cigarettes    Quit date: 2010    Years since quitting: 14.1   Smokeless tobacco: Never  Vaping Use   Vaping Use: Every day   Substances: Flavoring   Substance and Sexual Activity   Alcohol use: Yes    Comment: socially   Drug use: No   Sexual activity: Not Currently    Partners: Male    Birth control/protection: Surgical    Comment: hyst  Other Topics Concern   Not on file  Social History Narrative   Not on file   Social Determinants of Health   Financial Resource Strain: Not on file  Food Insecurity: Not on file  Transportation Needs: Not on file  Physical Activity: Not on file  Stress: Not on file  Social Connections: Not on file  Intimate Partner Violence: Not on file    Outpatient Encounter Medications as of 03/05/2023  Medication Sig   busPIRone (BUSPAR) 7.5 MG tablet Take 1 tablet (7.5 mg total) by mouth 2 (two) times daily.   ALPRAZolam (XANAX) 0.5 MG tablet Take 0.5 mg by mouth every 8 (eight) hours as needed for anxiety.   butalbital-acetaminophen-caffeine (FIORICET) 50-325-40 MG tablet Take 1 tablet by mouth 4 (four) times daily as needed for headache or migraine.   DULoxetine (CYMBALTA) 20 MG capsule Take 1 capsule (20 mg total) by mouth 2 (two) times daily.   furosemide (LASIX) 20 MG tablet Take 20 mg by mouth 4 (four) times daily.   OVER THE COUNTER MEDICATION Womens daily vit   OVER THE COUNTER MEDICATION Hair, skin & nails   OVER THE COUNTER MEDICATION Glucosamine econdrodin   rosuvastatin (CRESTOR) 10 MG tablet Take 10 mg by mouth daily.   Semaglutide,0.25 or 0.'5MG'$ /DOS, 2 MG/1.5ML SOPN Inject 0.25 mg into the skin once a week. Sample given   Turmeric (QC TUMERIC COMPLEX PO) Take by mouth.   VENTOLIN HFA 108 (90 Base) MCG/ACT inhaler Inhale 1-2 puffs into the lungs every 4 (four) hours as needed.   Vitamin D, Ergocalciferol, (DRISDOL) 1.25 MG (50000 UNIT) CAPS capsule Take 50,000 Units by mouth once a week.   [DISCONTINUED] citalopram (CELEXA) 20 MG tablet Take 20 mg by mouth daily.     No facility-administered encounter medications on file as of 03/05/2023.    Allergies  Allergen Reactions   Coconut  Flavor Anaphylaxis    Review of Systems  Constitutional:  Positive for activity change, appetite change and fatigue. Negative for chills, diaphoresis, fever and unexpected weight change.  Respiratory:  Negative for cough and shortness of breath.   Cardiovascular:  Positive for leg swelling. Negative for chest pain and palpitations.  Genitourinary:  Negative for decreased urine volume and difficulty urinating.  Psychiatric/Behavioral:  Positive for agitation and sleep disturbance. Negative for behavioral problems, confusion, decreased concentration, dysphoric mood, hallucinations, self-injury and suicidal ideas. The patient is nervous/anxious. The patient is not hyperactive.   All other systems reviewed and are negative.        Observations/Objective: No vital signs or physical exam, this was a virtual health encounter.  Pt  alert and oriented, answers all questions appropriately, and able to speak in full sentences.    Assessment and Plan: Darianna was seen today for anxiety.  Diagnoses and all orders for this visit:  Generalized anxiety disorder Educated on proper dosing of Cymbalta. Will prescribed below for better control of anxiety. Pt to follow up in 4 weeks for reevaluation.  -     busPIRone (BUSPAR) 7.5 MG tablet; Take 1 tablet (7.5 mg total) by mouth 2 (two) times daily.     Follow Up Instructions: Return in about 4 weeks (around 04/02/2023) for anxiety.    I discussed the assessment and treatment plan with the patient. The patient was provided an opportunity to ask questions and all were answered. The patient agreed with the plan and demonstrated an understanding of the instructions.   The patient was advised to call back or seek an in-person evaluation if the symptoms worsen or if the condition fails to improve as anticipated.  The above assessment and management plan was discussed with the patient. The patient verbalized understanding of and has agreed to the management  plan. Patient is aware to call the clinic if they develop any new symptoms or if symptoms persist or worsen. Patient is aware when to return to the clinic for a follow-up visit. Patient educated on when it is appropriate to go to the emergency department.    I provided 12 minutes of time during this MyChart Video encounter.   Monia Pouch, FNP-C [facility] [address] [place], [place] [address] [phone] 03/05/2023

## 2023-03-10 MED ORDER — ONDANSETRON 4 MG PO TBDP: 4.0000 mg | ORAL_TABLET | Freq: Three times a day (TID) | ORAL | 1 refills | Status: DC | PRN

## 2023-03-10 NOTE — Addendum Note (Signed)
Addended by: Lottie Dawson D on: 03/10/2023 10:37 AM   Modules accepted: Orders

## 2023-03-11 ENCOUNTER — Ambulatory Visit (HOSPITAL_COMMUNITY)
Admission: RE | Admit: 2023-03-11 | Discharge: 2023-03-11 | Disposition: A | Discharge status: Home / Self Care | Payer: Medicaid Other | Source: Ambulatory Visit | Attending: Family Medicine | Admitting: Family Medicine

## 2023-03-11 DIAGNOSIS — I890 Lymphedema, not elsewhere classified: Secondary | ICD-10-CM | POA: Insufficient documentation

## 2023-03-11 DIAGNOSIS — R29898 Other symptoms and signs involving the musculoskeletal system: Secondary | ICD-10-CM | POA: Insufficient documentation

## 2023-03-11 DIAGNOSIS — R600 Localized edema: Secondary | ICD-10-CM | POA: Diagnosis not present

## 2023-03-11 NOTE — Addendum Note (Signed)
Addended by: Baruch Gouty on: 03/11/2023 04:39 PM   Modules accepted: Orders

## 2023-03-18 NOTE — Progress Notes (Unsigned)
VASCULAR [facility] VEIN SPECIALISTS           OF [place]  History and Physical   Barbara Jennings is a 42 y.o. female who presents with bilateral lower extremity swelling and discomfort.  She has been seen by her PCP and duplex ordered, which revealed venous reflux and she was referred to vascular surgery for further evaluation. She explains that over past year she has noticed swelling in her legs. The swelling is about the same in both legs, some days one leg more than the other. She also has a lot of soreness and tenderness, itching, pressure and feeling like " my ankles are going to break". She tries to elevate a little but explains that between her two young kids and work she has limited time to do this. She also has tried knee high compression stockings from [place] but she could not tolerate them and ended up actually having to cut them off her leg. 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 She is currently trying to lose weight because her PCP told her that was her may issue. The pt has not had previous venous procedures.The patient has no history of DVT. Pt does not have history of varicose vein.  Pt does not have history of skin changes in lower legs.  There is some family history of venous disorders in her paternal grandmother    The pt is on a statin for cholesterol management.  The pt is not on a daily aspirin.   Other AC:  none The pt is on diuretic for hypertension.   The pt is not on medication for diabetes Tobacco hx:  former  Pt does not have family hx of AAA.  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 02/02/2014   Had ascus with +HPV will get colpo   Abnormal Pap smear    Abnormal uterine bleeding (AUB) 10/11/2014   Anxiety    Asthma    Depression    Dysmenorrhea 10/11/2014   Headache    HSV-2 (herpes simplex virus 2) infection     Hx of migraines 02/09/2014   Has ?aura, will rx POP   Ilioinguinal neuralgia    left   Meningitis    Migraine    Other and unspecified ovarian cyst 02/06/2014   Ovarian cyst 10/18/2014   Panic attack    Thickened endometrium 10/18/2014   Unspecified symptom associated with female genital organs 01/27/2014   Vaginal Pap smear, abnormal    Vulvar abscess 03/09/2014   I&D 3/9 in ER at [facility] was rx'd bactrim ds x 10 days    Past Surgical History:  Procedure Laterality Date   ABDOMINAL HYSTERECTOMY     APPENDECTOMY     CESAREAN SECTION     CHOLECYSTECTOMY     DILATION AND CURETTAGE OF UTERUS     LAPAROSCOPIC SUPRACERVICAL HYSTERECTOMY N/A 01/02/2015   Procedure: ATTEMPTED LAPAROSCOPIC SUPRACERVICAL HYSTERECTOMY CONVERTED TO OPEN AT [id number];  Surgeon: Jonnie Kind, MD;  Location: [facility];  Service: Gynecology;  Laterality: N/A;   OVARIAN CYST REMOVAL     SCAR REVISION N/A 06/26/2015   Procedure: ABDOMINAL SCAR REVISION;  Surgeon: Jonnie Kind, MD;  Location: [facility];  Service: Gynecology;  Laterality: N/A;   SUPRACERVICAL ABDOMINAL HYSTERECTOMY N/A 01/02/2015   Procedure: HYSTERECTOMY SUPRACERVICAL ABDOMINAL;  Surgeon: Mallory Shirk  V, MD;  Location: [facility];  Service: Gynecology;  Laterality: N/A;    Social History   Socioeconomic History   Marital status: Divorced    Spouse name: Not on file   Number of children: 1   Years of education: Not on file   Highest education level: Not on file  Occupational History   Not on file  Tobacco Use   Smoking status: Former    Packs/day: 0.00    Years: 12.00    Additional pack years: 0.00    Total pack years: 0.00    Types: E-cigarettes, Cigarettes    Quit date: 2010    Years since quitting: 14.2   Smokeless tobacco: Never  Vaping Use   Vaping Use: Every day   Substances: Flavoring  Substance and Sexual Activity   Alcohol use: Yes    Comment: socially   Drug use: No   Sexual activity: Not Currently    Partners: Male    Birth  control/protection: Surgical    Comment: hyst  Other Topics Concern   Not on file  Social History Narrative   Not on file   Social Determinants of Health   Financial Resource Strain: Not on file  Food Insecurity: Not on file  Transportation Needs: Not on file  Physical Activity: Not on file  Stress: Not on file  Social Connections: Not on file  Intimate Partner Violence: Not on file     Family History  Problem Relation Age of Onset   Cancer - Lung Mother        lump nodule   Cancer Mother        brain   Arthritis Mother    Ulcers Father    Heart attack Father    Breast cancer Maternal Aunt    Breast cancer Maternal Aunt    Diabetes Maternal Aunt    Cancer Paternal Aunt        cervical   Diabetes Maternal Grandmother    Miscarriages / Stillbirths Maternal Grandmother    Diabetes Maternal Grandfather    Hearing loss Maternal Grandfather    Heart disease Maternal Grandfather    Alzheimer's disease Paternal Grandmother     Current Outpatient Medications  Medication Sig Dispense Refill   ondansetron (ZOFRAN-ODT) 4 MG disintegrating tablet Take 1 tablet (4 mg total) by mouth every 8 (eight) hours as needed for nausea or vomiting. 20 tablet 1   ALPRAZolam (XANAX) 0.5 MG tablet Take 0.5 mg by mouth every 8 (eight) hours as needed for anxiety.     busPIRone (BUSPAR) 7.5 MG tablet Take 1 tablet (7.5 mg total) by mouth 2 (two) times daily. 60 tablet 3   butalbital-acetaminophen-caffeine (FIORICET) 50-325-40 MG tablet Take 1 tablet by mouth 4 (four) times daily as needed for headache or migraine.     DULoxetine (CYMBALTA) 20 MG capsule Take 1 capsule (20 mg total) by mouth 2 (two) times daily. 90 capsule 1   furosemide (LASIX) 20 MG tablet Take 20 mg by mouth 4 (four) times daily.     OVER THE COUNTER MEDICATION Womens daily vit     OVER THE COUNTER MEDICATION Hair, skin & nails     OVER THE COUNTER MEDICATION Glucosamine econdrodin     rosuvastatin (CRESTOR) 10 MG tablet Take  10 mg by mouth daily.     Semaglutide,0.25 or 0.5MG /DOS, 2 MG/1.5ML SOPN Inject 0.25 mg into the skin once a week. Sample given     Turmeric (QC TUMERIC COMPLEX PO) Take by  mouth.     VENTOLIN HFA 108 (90 Base) MCG/ACT inhaler Inhale 1-2 puffs into the lungs every 4 (four) hours as needed.     Vitamin D, Ergocalciferol, (DRISDOL) 1.25 MG (50000 UNIT) CAPS capsule Take 50,000 Units by mouth once a week.     No current facility-administered medications for this visit.    Allergies  Allergen Reactions   Coconut Flavor Anaphylaxis    REVIEW OF SYSTEMS:   [X]  denotes positive finding, [ ]  denotes negative finding Cardiac  Comments:  Chest pain or chest pressure:    Shortness of breath upon exertion:    Short of breath when lying flat:    Irregular heart rhythm:        Vascular    Pain in calf, thigh, or hip brought on by ambulation:    Pain in feet at night that wakes you up from your sleep:     Blood clot in your veins:    Leg swelling:  x       Pulmonary    Oxygen at home:    Productive cough:     Wheezing:         Neurologic    Sudden weakness in arms or legs:     Sudden numbness in arms or legs:     Sudden onset of difficulty speaking or slurred speech:    Temporary loss of vision in one eye:     Problems with dizziness:         Gastrointestinal    Blood in stool:     Vomited blood:         Genitourinary    Burning when urinating:     Blood in urine:        Psychiatric    Major depression:         Hematologic    Bleeding problems:    Problems with blood clotting too easily:        Skin    Rashes or ulcers:        Constitutional    Fever or chills:      PHYSICAL EXAMINATION:  Vitals:   03/19/23 1012  BP: 133/78  Pulse: 73  Resp: 20  Temp: 97.6 F (36.4 C)  SpO2: 100%    General:  obese female in NAD; vital signs documented above Gait: Normal HENT: WNL, normocephalic Pulmonary: normal non-labored breathing without wheezing Cardiac: regular  HR Abdomen: soft Vascular Exam/Pulses: DP 2+ (normal) 2+ (normal)  PT 2+ (normal) 2+ (normal)   Extremities: bilateral lower extremities are obese, no appreciable edema. She does have scattered spider veins bilaterally. No cellulitis or phlebitis present  Neurologic: A&O X 3;  moving all extremities equally Psychiatric:  The pt has Normal affect.   Non-Invasive Vascular Imaging:   Venous duplex on 03/11/2023: +-------------+---------+------+---------+------------+--------------------  LEFT        Reflux NoReflux Reflux  Diameter cmsComments                                          Yes    Time                                    +-------------+---------+------+---------+------------+--------------------  CFV         no                                                       +-------------+---------+------+---------+------------+--------------------  FV prox      no                                                       +-------------+---------+------+---------+------------+--------------------   FV mid       no                                                       +-------------+---------+------+---------+------------+--------------------  FV dist      no                                                       +-------------+---------+------+---------+------------+--------------------  Popliteal   no                                                       +-------------+---------+------+---------+------------+--------------------  GSV at SFJ   no                          .93                          +-------------+---------+------+---------+------------+--------------------  GSV prox     no                          .64    thigh                     +-------------+---------+------+---------+------------+--------------------  GSV mid thigh          yes   >500 ms     .32     insuffiencey in  perforator   +-------------+---------+------+---------+------------+--------------------  GSV dist     no                          .31    thigh                               +-------------+---------+------+---------+------------+--------------------  GSV at knee            yes   >500 ms     .42                          +-------------+---------+------+---------+------------+--------------------   GSV prox calf          yes   >500 ms     .34                          +-------------+---------+------+---------+------------+--------------------  GSV mid calf  not visualized       +-------------+---------+------+---------+------------+--------------------  GSV dist calf                                    not visualized       +-------------+---------+------+---------+------------+--------------------  SSV Pop Fossano                          .18                          +-------------+---------+------+---------+------------+--------------------  SSV prox calfno                          .35                           +-------------+---------+------+---------+------------+--------------------  SSV mid calf no                          .32                          +-------------+---------+------+---------+------------+--------------------   Summary:  Left:  - No evidence of deep vein thrombosis seen in the left lower extremity, from the common femoral through the popliteal veins.  - No evidence of superficial venous thrombosis in the left lower extremity.  - Venous reflux is noted in the left greater saphenous vein in the thigh.  - Venous reflux is noted in the left greater saphenous vein in the calf.  - Venous reflux is noted in the left perforator vein.    Comparisons: There is no previous study available for comparison.   Recommendations: VASCULAR CONSULT IS INDICATED.    Barbara Jennings is a 42 y.o. female who presents with:  Chronic venous insufficiency of BLE with discomfort.    Her recent duplex shows that she does not have evidence of DVT or SVT.  Pt does have venous reflux in the mid GSV and GSV at the knee and the calf.  She does not have any reflux at the [facility] or the deep venous system. We did not observe her RLE however would expect very similar findings --pt has 2+ pedal pulses. She has no concern for arterial disease -discussed with pt about wearing 15-20 mmHg knee high compression stockings and pt was measured for these today.  -discussed the importance of leg elevation and how to elevate properly elevate with her legs above level of her heart  -pt is advised to continue as much walking as possible and avoid sitting or standing for long periods of time.  -discussed importance of weight loss and exercise -handout with recommendations given -pt will f/u as needed if she has any new or concerning symptoms    Paulo Fruit  [facility] [phone]  Clinic MD:  Scot Dock

## 2023-03-19 ENCOUNTER — Ambulatory Visit: Payer: Medicaid Other | Admitting: Physician Assistant

## 2023-03-19 VITALS — BP 133/78 | HR 73 | Temp 97.6°F | Resp 20 | Ht 66.0 in | Wt 318.1 lb

## 2023-03-19 DIAGNOSIS — I872 Venous insufficiency (chronic) (peripheral): Secondary | ICD-10-CM | POA: Diagnosis not present

## 2023-04-18 ENCOUNTER — Other Ambulatory Visit: Payer: Self-pay | Admitting: Family Medicine

## 2023-04-21 ENCOUNTER — Ambulatory Visit (INDEPENDENT_AMBULATORY_CARE_PROVIDER_SITE_OTHER): Payer: Medicaid Other | Admitting: Pharmacist

## 2023-04-21 MED ORDER — ONDANSETRON 4 MG PO TBDP: 4.0000 mg | ORAL_TABLET | Freq: Three times a day (TID) | ORAL | 1 refills | Status: DC | PRN

## 2023-04-21 NOTE — Progress Notes (Signed)
      04/21/23 Name: Barbara Jennings MRN: [id number]       DOB: 1981-02-13     S:  76 yoF presents for metabolic syndrome evaluation, education, and management Patient was referred and last seen by Primary Care Provider on 02/17/23. Patient continues on semaglutide for cardiac, PCOS and obesity treatment, however continues to battle nausea.  She has lost approximately 20lbs.  She also has a significant family cardiac history.   Insurance coverage/medication affordability: Medicaid   Patient-reported exercise habits: encouraged; dealing with lower leg edema       O:   A1c 5.6 on 02/17/23 BP 130-140-80    A/P:   Continue on semaglutide  sq weekly Will send in zofran for nausea--use sparingly Denies personal and family history of Medullary thyroid cancer (MTC) Will attempt to get it covered on insurance pending A1c vs prescribing GLP1 for weight loss. Will follow up to prescribed GLP1 per pending diagnoses.  Discussed with PCP       Written patient instructions provided.  Total time in face to face counseling 20 minutes.        Kieth Brightly, PharmD, BCPS, BCACP Clinical Pharmacist, [facility]  II  T [phone]

## 2023-05-13 ENCOUNTER — Other Ambulatory Visit: Payer: Self-pay | Admitting: Family Medicine

## 2023-06-16 ENCOUNTER — Other Ambulatory Visit: Payer: Self-pay | Admitting: Family Medicine

## 2023-06-16 DIAGNOSIS — I890 Lymphedema, not elsewhere classified: Secondary | ICD-10-CM

## 2023-06-17 NOTE — Addendum Note (Signed)
Addended by: Sonny Masters on: 06/17/2023 03:57 PM   Modules accepted: Orders

## 2023-06-24 ENCOUNTER — Other Ambulatory Visit: Payer: Self-pay | Admitting: Pharmacist

## 2023-06-24 ENCOUNTER — Encounter: Payer: Self-pay | Admitting: Pharmacist

## 2023-06-24 MED ORDER — ONDANSETRON 4 MG PO TBDP
4.0000 mg | ORAL_TABLET | Freq: Three times a day (TID) | ORAL | 1 refills | Status: DC | PRN
Start: 2023-06-24 — End: 2023-08-04

## 2023-06-24 NOTE — Progress Notes (Signed)
             06/24/23 Name: Barbara Jennings MRN: [id number]       DOB: 12-02-1981     S:  2 yoF presents for metabolic syndrome evaluation, education, and management Patient was referred and last seen by Primary Care Provider on 03/05/23. Patient continues on semaglutide for cardiac, PCOS and obesity treatment, however continues to battle nausea.  She reports this has improved since initiation of semglutide and still is taking Zofran as needed.  She has lost approximately 40lbs.  She also has a significant family cardiac history.   Insurance coverage/medication affordability: Medicaid   Patient-reported exercise habits: encouraged; dealing with lower leg edema       O:   A1c 5.6 on 02/17/23 Starting weight prior to semaglutide -->318lbs Current weight-->278.6lbs BP 130-140/80  Current Outpatient Medications on File Prior to Visit  Medication Sig Dispense Refill   ALPRAZolam (XANAX) 0.5 MG tablet Take 0.5 mg by mouth every 8 (eight) hours as needed for anxiety.     busPIRone (BUSPAR) 7.5 MG tablet Take 1 tablet (7.5 mg total) by mouth 2 (two) times daily. 60 tablet 3   butalbital-acetaminophen-caffeine (FIORICET) 50-325-40 MG tablet Take 1 tablet by mouth 4 (four) times daily as needed for headache or migraine.     DULoxetine (CYMBALTA) 20 MG capsule Take 1 capsule (20 mg total) by mouth 2 (two) times daily. 90 capsule 1   furosemide (LASIX) 20 MG tablet Take 20 mg by mouth 4 (four) times daily.     OVER THE COUNTER MEDICATION Womens daily vit     OVER THE COUNTER MEDICATION Hair, skin & nails     OVER THE COUNTER MEDICATION Glucosamine econdrodin     rosuvastatin (CRESTOR) 10 MG tablet Take 10 mg by mouth daily.     Semaglutide,0.25 or 0.5MG /DOS, 2 MG/1.5ML SOPN Inject 1 mg into the skin once a week. Sample given     Turmeric (QC TUMERIC COMPLEX PO) Take by mouth.     VENTOLIN HFA 108 (90 Base) MCG/ACT inhaler Inhale 1-2 puffs into the lungs every 4 (four) hours as needed.      Vitamin D, Ergocalciferol, (DRISDOL) 1.25 MG (50000 UNIT) CAPS capsule Take 50,000 Units by mouth once a week.     [DISCONTINUED] citalopram (CELEXA) 20 MG tablet Take 20 mg by mouth daily.       No current facility-administered medications on file prior to visit.       A/P:   Continue on semaglutide 1mg  sq weekly Continue zofran for nausea as needed--use sparingly Denies personal and family history of Medullary thyroid cancer (MTC) FOLLOWING A HEART HEALTHY DIET/HEALTHY PLATE METHOD Continue to increase exercise/water intake Will attempt to get it covered on insurance pending A1c vs prescribing GLP1 for weight loss. Will follow up to prescribe GLP1 per pending diagnoses.  Discussed with PCP       Written patient instructions provided.  Total time in counseling 20 minutes.        Kieth Brightly, PharmD, BCACP Clinical Pharmacist, [facility]  II  T [phone]

## 2023-06-25 NOTE — Addendum Note (Signed)
Addended by: Sonny Masters on: 06/25/2023 11:56 AM   Modules accepted: Orders

## 2023-08-04 ENCOUNTER — Telehealth: Payer: Self-pay | Admitting: Pharmacist

## 2023-08-04 MED ORDER — ONDANSETRON 4 MG PO TBDP
4.0000 mg | ORAL_TABLET | Freq: Three times a day (TID) | ORAL | 1 refills | Status: DC | PRN
Start: 2023-08-04 — End: 2023-09-22

## 2023-08-04 NOTE — Telephone Encounter (Signed)
      08/04/23 Name: Barbara Jennings MRN: [id number]       DOB: October 04, 1981     S:  89 yoF presents for metabolic syndrome evaluation, education, and management Patient continues on semaglutide for cardiac, PCOS and obesity treatment, however continues to battle nausea.  She has lost approximately 20lbs.  She also has a significant family cardiac history.   Insurance coverage/medication affordability: Medicaid   Patient-reported exercise habits: encouraged; dealing with lower leg edema     O:   A1c 5.6 on 02/17/23 BP 130-140-80    A/P:   Continue on semaglutide 1mg  sq weekly (tried to escalate to 1.5mg  weekly, but patient experienced GI side effects) Will continue Zofran for nausea--use sparingly patient denies personal or family history of medullary thyroid carcinoma (MTC) or in patients with Multiple Endocrine Neoplasia syndrome type 2 (MEN 2) Will attempt to get it covered on insurance pending A1c vs prescribing GLP1 for weight loss. Will follow up to prescribed GLP1 per pending diagnoses.  Discussed with PCP       Written patient instructions provided.  Total time in face to face counseling 20 minutes.        Kieth Brightly, PharmD, BCPS, BCACP Clinical Pharmacist, [facility]  II  T [phone]

## 2023-08-05 ENCOUNTER — Telehealth: Payer: Self-pay | Admitting: Pharmacist

## 2023-08-05 ENCOUNTER — Other Ambulatory Visit: Payer: Medicaid Other | Admitting: Pharmacist

## 2023-08-05 MED ORDER — WEGOVY 1 MG/0.5ML ~~LOC~~ SOAJ
1.0000 mg | SUBCUTANEOUS | 3 refills | Status: DC
Start: 2023-08-05 — End: 2023-09-04

## 2023-08-05 NOTE — Telephone Encounter (Signed)
[facility] submitted BMI 44.55 patient denies personal or family history of medullary thyroid carcinoma (MTC) or in patients with Multiple Endocrine Neoplasia syndrome type 2 (MEN 2)

## 2023-08-05 NOTE — Progress Notes (Unsigned)
08/05/2023 Name: ROISIN VILHAUER MRN: [id number] DOB: 1981/09/13  Chief Complaint  Patient presents with   Obesity    MONTA ZISMAN is a 42 y.o. year old female who presented for a telephone visit.   They were referred to the pharmacist by their PCP for assistance in managing  obesity .   Subjective:  Care Team: Primary Care Provider: Sonny Masters, FNP   Medication Access/Adherence  Current Pharmacy:  [facility] - [place], [place] - [id number] [place] [address] Phone: [phone] Fax: [phone]   Patient reports affordability concerns with their medications: No  Patient reports access/transportation concerns to their pharmacy: No  Patient reports adherence concerns with their medications:  No    Obesity/Overweight, Complicated by ***:   Weight Management treatments previously prescribed: has been on Ozempic samples  Current meal patterns:  The patient is asked to make an attempt to improve diet and exercise patterns to aid in medical management of this problem. Discussed meal planning options and Plate method for healthy eating Avoid sugary drinks and desserts Incorporate balanced protein, non starchy veggies, 1 serving of carbohydrate with each meal Increase water intake Increase physical activity as able  Current physical activity: encouraged as able  Current medication access support: *** [phone] docs   Objective:  Lab Results  Component Value Date   HGBA1C 5.6 02/17/2023    Lab Results  Component Value Date   CREATININE 0.51 (L) 02/17/2023   BUN 9 02/17/2023   NA 137 02/17/2023   K 4.7 02/17/2023   CL 98 02/17/2023   CO2 25 02/17/2023    No results found for: "CHOL", "HDL", "LDLCALC", "LDLDIRECT", "TRIG", "CHOLHDL"  Medications Reviewed Today     Reviewed by Danella Maiers, [facility] (Pharmacist) on 08/05/23 at 1239  Med List Status: <None>   Medication Order Taking? Sig  Documenting Provider Last Dose Status Informant  Patient not taking:  Discontinued 08/05/23 1238 (Change in therapy) busPIRone (BUSPAR) 7.5 MG tablet [id number] No Take 1 tablet (7.5 mg total) by mouth 2 (two) times daily. Sonny Masters, FNP Taking Active   butalbital-acetaminophen-caffeine (FIORICET) 50-325-40 MG tablet [id number] No Take 1 tablet by mouth 4 (four) times daily as needed for headache or migraine. [provider] Taking Active Self  Discontinued 03/04/12 2154 (Non-compliance) DULoxetine (CYMBALTA) 20 MG capsule [id number] No Take 1 capsule (20 mg total) by mouth 2 (two) times daily. Sonny Masters, FNP Taking Active   furosemide (LASIX) 20 MG tablet [id number] No Take 20 mg by mouth 4 (four) times daily. [provider] Taking Active Self  ondansetron (ZOFRAN-ODT) 4 MG disintegrating tablet [id number]  Take 1 tablet (4 mg total) by mouth every 8 (eight) hours as needed for nausea or vomiting. Sonny Masters, FNP  Active   OVER THE COUNTER MEDICATION [id number] No Womens daily vit [provider] Taking Active   OVER THE COUNTER MEDICATION [id number] No Hair, skin & nails [provider] Taking Active   OVER THE COUNTER MEDICATION [id number] No Glucosamine econdrodin [provider] Taking Active   rosuvastatin (CRESTOR) 10 MG tablet [id number] No Take 10 mg by mouth daily. [provider] Taking Active   Semaglutide-Weight Management [facility]) 1 MG/0.5ML Ivory Broad [id number] Yes Inject 1 mg into the skin once a week. Sonny Masters, FNP  Active   Turmeric (QC TUMERIC COMPLEX PO) [id number] No Take by mouth. [provider] Taking Active   VENTOLIN HFA 108 (90 Base)  MCG/ACT inhaler [id number] No Inhale 1-2 puffs into the lungs every 4 (four) hours as needed. [provider] Taking Active   Vitamin D, Ergocalciferol, (DRISDOL) 1.25 MG (50000 UNIT) CAPS capsule [id number] No Take 50,000 Units by mouth once a week. [provider] Taking Active   Med List Note Moss Mc, RN 06/12/17 1201): Ke              Assessment/Plan:   {Pharmacy A/P Choices:26421}  Follow Up Plan: ***  ***

## 2023-08-06 ENCOUNTER — Encounter: Payer: Self-pay | Admitting: Pharmacist

## 2023-09-04 ENCOUNTER — Other Ambulatory Visit: Payer: Self-pay | Admitting: Pharmacist

## 2023-09-04 MED ORDER — WEGOVY 1.7 MG/0.75ML ~~LOC~~ SOAJ: 1.7000 mg | SUBCUTANEOUS | 3 refills | Status: DC

## 2023-09-04 NOTE — Progress Notes (Unsigned)
09/04/2023 Name: Barbara Jennings MRN: [id number]       DOB: 02-13-1981      Chief Complaint  Patient presents with   Obesity      Barbara Jennings is a 42 y.o. year old female who presented for a telephone visit.   They were referred to the pharmacist by their PCP for assistance in managing  obesity .  Patient's current baseline BMI is 44.55 kg/m2.  Her current weight  remains at 276lbs.  She is ready to increase Wegovy to aid with weight loss and family cardiac history.  She is enrolled in a diet/exercise plan (working to eat healthy).  The patient denies personal or family history of medullary thyroid carcinoma (MTC) or in patients with Multiple Endocrine Neoplasia syndrome type 2 (MEN 2).     Subjective:   Care Team: Primary Care Provider: Sonny Masters, FNP    Medication Access/Adherence   Current Pharmacy:  [facility] - [place], [place] - [phone] [place] [address] Phone: [phone] Fax: [phone]     Patient reports affordability concerns with their medications: No  Patient reports access/transportation concerns to their pharmacy: No  Patient reports adherence concerns with their medications:  No     Obesity/Overweight   Weight Management treatments previously prescribed: wegovy   Current meal patterns:  The patient is asked to make an attempt to improve diet and exercise patterns to aid in medical management of this problem. Discussed meal planning options and Plate method for healthy eating Avoid sugary drinks and desserts Incorporate balanced protein, non starchy veggies, 1 serving of carbohydrate with each meal Increase water intake Increase physical activity as able   Current physical activity: encouraged as able   Current medication access support: n/a; medicaid   Objective:   Recent Labs       Lab Results  Component Value Date    HGBA1C 5.6 02/17/2023         Recent Labs       Lab Results   Component Value Date    CREATININE 0.51 (L) 02/17/2023    BUN 9 02/17/2023    NA 137 02/17/2023    K 4.7 02/17/2023    CL 98 02/17/2023    CO2 25 02/17/2023        Medications Reviewed Today       Reviewed by Danella Maiers, [facility] (Pharmacist) on 08/05/23 at 1239  Med List Status: <None>    Medication Order Taking? Sig Documenting Provider Last Dose Status Informant  Patient not taking:  Discontinued 08/05/23 1238 (Change in therapy) busPIRone (BUSPAR) 7.5 MG tablet [id number] No Take 1 tablet (7.5 mg total) by mouth 2 (two) times daily. Sonny Masters, FNP Taking Active    butalbital-acetaminophen-caffeine (FIORICET) 50-325-40 MG tablet [id number] No Take 1 tablet by mouth 4 (four) times daily as needed for headache or migraine. [provider] Taking Active Self  Discontinued 03/04/12 2154 (Non-compliance) DULoxetine (CYMBALTA) 20 MG capsule [id number] No Take 1 capsule (20 mg total) by mouth 2 (two) times daily. Sonny Masters, FNP Taking Active    furosemide (LASIX) 20 MG tablet [id number] No Take 20 mg by mouth 4 (four) times daily. [provider] Taking Active Self  ondansetron (ZOFRAN-ODT) 4 MG disintegrating tablet [id number]   Take 1 tablet (4 mg total) by mouth every 8 (eight) hours as needed for nausea or vomiting. Sonny Masters, FNP   Active  OVER THE COUNTER MEDICATION [id number] No Womens daily vit [provider] Taking Active    OVER THE COUNTER MEDICATION [id number] No Hair, skin & nails [provider] Taking Active    OVER THE COUNTER MEDICATION [id number] No Glucosamine econdrodin [provider] Taking Active    rosuvastatin (CRESTOR) 10 MG tablet [id number] No Take 10 mg by mouth daily. [provider] Taking Active    Semaglutide-Weight Management [facility]) 1 MG/0.5ML Ivory Broad [id number] Yes Inject 1 mg into the skin once a week. Sonny Masters, FNP   Active    Turmeric (QC TUMERIC COMPLEX PO) [id number] No Take by mouth.  [provider] Taking Active    VENTOLIN HFA 108 (90 Base) MCG/ACT inhaler [id number] No Inhale 1-2 puffs into the lungs every 4 (four) hours as needed. [provider] Taking Active    Vitamin D, Ergocalciferol, (DRISDOL) 1.25 MG (50000 UNIT) CAPS capsule [id number] No Take 50,000 Units by mouth once a week. [provider] Taking Active    Med List Note Moss Mc, RN 06/12/17 1201): Ke                  Assessment/Plan:    Obesity/Overweight: - Currently unable to achieve goal weight loss of 5-10% through diet and lifestyle modifications alone - Extensive dietary counseling including education on focus on lean proteins, fruits and vegetables, whole grains and increased fiber consumption, adequate hydration - Extensive exercise counseling including eventual goal of 150 minutes of moderate intensity exercise weekly - Provided motivational interviewing. Discussed setting non-weight based goals - Recommend to start wegovy Increase Wegovy to 1.7mg  weekly Patient meets ALL criteria set forth for [facility] by [place] medicaid and the FDA approval for obesity BMI 44.55 Current weight 276lbs Family cardiac history patient denies personal or family history of medullary thyroid carcinoma (MTC) or in patients with Multiple Endocrine Neoplasia syndrome type 2 (MEN 2)    Follow Up Plan: 1 month     Kieth Brightly, PharmD, BCACP Clinical Pharmacist, [facility]

## 2023-09-21 ENCOUNTER — Other Ambulatory Visit: Payer: Self-pay | Admitting: Family Medicine

## 2023-09-21 DIAGNOSIS — F411 Generalized anxiety disorder: Secondary | ICD-10-CM

## 2023-09-22 ENCOUNTER — Telehealth: Payer: Self-pay | Admitting: Pharmacist

## 2023-09-22 MED ORDER — ONDANSETRON 4 MG PO TBDP: 4.0000 mg | ORAL_TABLET | Freq: Three times a day (TID) | ORAL | 1 refills | Status: DC | PRN

## 2023-09-22 NOTE — Telephone Encounter (Signed)
09/22/23 Name: Barbara Jennings MRN: [id number]       DOB: 08-01-1981   Continue on semaglutide [facility]) 1.7mg  sq weekly  Will continue Zofran for nausea--use sparingly patient denies personal or family history of medullary thyroid carcinoma (MTC) or in patients with Multiple Endocrine Neoplasia syndrome type 2 (MEN 2) Patient encouraged to make PCP f/u appt.  Kieth Brightly, PharmD, BCACP Clinical Pharmacist, [facility]

## 2023-09-28 ENCOUNTER — Other Ambulatory Visit: Payer: Self-pay | Admitting: Family Medicine

## 2024-01-21 ENCOUNTER — Ambulatory Visit (INDEPENDENT_AMBULATORY_CARE_PROVIDER_SITE_OTHER): Payer: Medicaid Other | Admitting: Pharmacist

## 2024-01-21 ENCOUNTER — Telehealth: Payer: Self-pay | Admitting: Pharmacist

## 2024-01-21 MED ORDER — ONDANSETRON 4 MG PO TBDP: 4.0000 mg | ORAL_TABLET | Freq: Three times a day (TID) | ORAL | 1 refills | Status: DC | PRN

## 2024-01-21 MED ORDER — WEGOVY 2.4 MG/0.75ML ~~LOC~~ SOAJ
2.4000 mg | SUBCUTANEOUS | 3 refills | Status: DC
Start: 2024-01-21 — End: 2024-03-31

## 2024-01-21 NOTE — Progress Notes (Signed)
01/21/2024 Name: Barbara Jennings MRN: [id number] DOB: July 23, 1981  Chief Complaint  Patient presents with   Obesity    Barbara Jennings is a 43 y.o. year old female who was referred for medication management by their primary care provider, Rakes, Doralee Albino, FNP. They presented for a face to face visit today.   They were referred to the pharmacist by their PCP for assistance in managing  obesity .  Patient's current baseline BMI is 44.55 kg/m2.  Her current weight  remains at 276lbs.  She is ready to increase Wegovy to aid with weight loss and family cardiac history.  She is enrolled in a diet/exercise plan (working to eat healthy).  The patient denies personal or family history of medullary thyroid carcinoma (MTC) or in patients with Multiple Endocrine Neoplasia syndrome type 2 (MEN 2).     Subjective:  Care Team: Primary Care Provider: Sonny Masters, FNP   Medication Access/Adherence  Current Pharmacy:  [facility] [place], [place] - [id number] [place] [address] Phone: [phone] Fax: [phone]   Patient reports affordability concerns with their medications: No  Patient reports access/transportation concerns to their pharmacy: No  Patient reports adherence concerns with their medications:  No     Obesity/Overweight   Weight Management treatments previously prescribed: wegovy   Current meal patterns:  The patient is asked to make an attempt to improve diet and exercise patterns to aid in medical management of this problem. Discussed meal planning options and Plate method for healthy eating Avoid sugary drinks and desserts Incorporate balanced protein, non starchy veggies, 1 serving of carbohydrate with each meal Increase water intake Increase physical activity as able   Current physical activity: encouraged as able   Current medication access support: n/a; medicaid  Current weight: 274.5lbs; BMI 44.31 (baseline BMI was  50.97)   Objective:  Lab Results  Component Value Date   HGBA1C 5.6 02/17/2023    Lab Results  Component Value Date   CREATININE 0.51 (L) 02/17/2023   BUN 9 02/17/2023   NA 137 02/17/2023   K 4.7 02/17/2023   CL 98 02/17/2023   CO2 25 02/17/2023    Medications Reviewed Today     Reviewed by Danella Maiers, [facility] (Pharmacist) on 01/21/24 at 1457  Med List Status: <None>   Medication Order Taking? Sig Documenting Provider Last Dose Status Informant  busPIRone (BUSPAR) 7.5 MG tablet [id number]  Take 1 tablet (7.5 mg total) by mouth 2 (two) times daily. **NEEDS TO BE SEEN BEFORE NEXT REFILL** Rakes, Doralee Albino, FNP  Active   butalbital-acetaminophen-caffeine (FIORICET) 50-325-40 MG tablet [id number] No Take 1 tablet by mouth 4 (four) times daily as needed for headache or migraine. [provider] Taking Active Self  Discontinued 03/04/12 2154 (Non-compliance) DULoxetine (CYMBALTA) 20 MG capsule [id number]  Take 1 capsule (20 mg total) by mouth 2 (two) times daily. **NEEDS TO BE SEEN BEFORE NEXT REFILL** Rakes, Doralee Albino, FNP  Active   furosemide (LASIX) 20 MG tablet [id number] No Take 20 mg by mouth 4 (four) times daily. [provider] Taking Active Self  ondansetron (ZOFRAN-ODT) 4 MG disintegrating tablet [id number]  Take 1 tablet (4 mg total) by mouth every 8 (eight) hours as needed for nausea or vomiting. Sonny Masters, FNP  Active   OVER THE COUNTER MEDICATION [id number] No Womens daily vit [provider] Taking Active   OVER THE COUNTER MEDICATION [id number] No Hair, skin & nails [provider] Taking Active   OVER THE COUNTER MEDICATION [id number] No Glucosamine econdrodin [provider] Taking Active   rosuvastatin (CRESTOR) 10 MG tablet [id number] No Take 10 mg by mouth daily. [provider] Taking Active   Semaglutide-Weight Management (WEGOVY) 2.4 MG/0.75ML SOAJ [id number]  Inject 2.4 mg into the skin once a week. Sonny Masters, FNP   Active   Turmeric (QC TUMERIC COMPLEX PO) [id number] No Take by mouth. [provider] Taking Active   VENTOLIN HFA 108 (90 Base) MCG/ACT inhaler [id number] No Inhale 1-2 puffs into the lungs every 4 (four) hours as needed. [provider] Taking Active   Vitamin D, Ergocalciferol, (DRISDOL) 1.25 MG (50000 UNIT) CAPS capsule [id number] No Take 50,000 Units by mouth once a week. [provider] Taking Active   Med List Note Moss Mc, RN 06/12/17 1201): Ke            Assessment/Plan:   Obesity/Overweight: - Currently unable to achieve goal weight loss of 5-10% through diet and lifestyle modifications alone - Extensive dietary counseling including education on focus on lean proteins, fruits and vegetables, whole grains and increased fiber consumption, adequate hydration - Extensive exercise counseling including eventual goal of 150 minutes of moderate intensity exercise weekly - Provided motivational interviewing. Discussed setting non-weight based goals - Recommend to: Increase Wegovy to 2.4mg  weekly Patient meets ALL criteria set forth for [facility] by [place] medicaid and the FDA approval for obesity BMI 44.55-->44.31 Current weight 274.5lbs Family cardiac history patient denies personal or family history of medullary thyroid carcinoma (MTC) or in patients with Multiple Endocrine Neoplasia syndrome type 2 (MEN 2)    Follow Up Plan: PCP on 02/04/24    Kieth Brightly, PharmD, BCACP, CPP Clinical Pharmacist, [facility]

## 2024-01-21 NOTE — Telephone Encounter (Signed)
Patient increasing to wegovy 2.4mg   See pharmd visit note

## 2024-01-27 ENCOUNTER — Ambulatory Visit: Payer: Medicaid Other | Admitting: Family Medicine

## 2024-02-02 ENCOUNTER — Other Ambulatory Visit: Payer: Self-pay | Admitting: Family Medicine

## 2024-02-04 ENCOUNTER — Telehealth: Payer: Self-pay | Admitting: Pharmacist

## 2024-02-04 ENCOUNTER — Ambulatory Visit: Payer: Medicaid Other | Admitting: Family Medicine

## 2024-02-09 ENCOUNTER — Ambulatory Visit: Payer: Medicaid Other | Admitting: Family Medicine

## 2024-02-16 ENCOUNTER — Ambulatory Visit: Payer: Medicaid Other | Admitting: Family Medicine

## 2024-02-16 ENCOUNTER — Other Ambulatory Visit (HOSPITAL_COMMUNITY): Payer: Self-pay

## 2024-02-16 VITALS — BP 139/75 | HR 75 | Temp 97.7°F | Ht 66.0 in | Wt 276.2 lb

## 2024-02-16 DIAGNOSIS — G43719 Chronic migraine without aura, intractable, without status migrainosus: Secondary | ICD-10-CM

## 2024-02-16 DIAGNOSIS — F411 Generalized anxiety disorder: Secondary | ICD-10-CM

## 2024-02-16 MED ORDER — NURTEC 75 MG PO TBDP: 75.0000 mg | ORAL_TABLET | ORAL | 6 refills | Status: DC

## 2024-02-16 MED ORDER — KETOROLAC TROMETHAMINE 30 MG/ML IJ SOLN
30.0000 mg | Freq: Once | INTRAMUSCULAR | Status: AC
Start: 2024-02-16 — End: 2024-02-16
  Administered 2024-02-16: 30 mg via INTRAMUSCULAR

## 2024-02-16 MED ORDER — DULOXETINE HCL 20 MG PO CPEP
20.0000 mg | ORAL_CAPSULE | Freq: Two times a day (BID) | ORAL | 1 refills | Status: DC
Start: 2024-02-16 — End: 2024-08-01

## 2024-02-16 MED ORDER — BUSPIRONE HCL 7.5 MG PO TABS: 7.5000 mg | ORAL_TABLET | Freq: Two times a day (BID) | ORAL | 1 refills | Status: AC

## 2024-02-16 NOTE — Progress Notes (Signed)
 Subjective:  Patient ID: Barbara Jennings, female    DOB: February 07, 1981, 43 y.o.   MRN: [id number]  Patient Care Team: Sonny Masters, FNP as PCP - General (Family Medicine)   Chief Complaint:  Migraine (Patient states she has been having migraines for years and would like to know some medication options. )   HPI: Barbara Jennings is a 43 y.o. female presenting on 02/16/2024 for Migraine (Patient states she has been having migraines for years and would like to know some medication options. )   Discussed the use of AI scribe software for clinical note transcription with the patient, who gave verbal consent to proceed.  History of Present Illness   Barbara Jennings is a 43 year old female with migraines who presents with a three-day headache.  She has been experiencing a severe headache for the past three days, described as a 'pounding' sensation. She took Nurtec yesterday, which provided some relief, but she experiences rebound headaches. She has not taken Nurtec daily, only once yesterday. Previously, she was prescribed Fioricet by another doctor but avoided using it due to prolonged drowsiness. She has tried Imitrex injections and a beta blocker in the past without success. She has not used Nurtec as a preventative treatment before.  She experiences approximately ten to twelve headache days per month, with headaches occurring three to four times a week. Poor sleep exacerbates the frequency of her headaches. She does not experience an aura before the onset of her headaches and wishes she had a warning sign to preemptively treat them. Sound and light sensitivity are present during her headaches, but she manages to cope due to her children.  She experiences nausea with her headaches but uses Zofran, which she has for another condition, Wegovy, to manage this symptom. She does not typically vomit but feels very nauseous. She has not visited a headache clinic or seen a neurologist in a long time.  She  is currently taking duloxetine 20 mg twice daily and Buspar once daily, although she acknowledges that taking Buspar once daily may be affecting her sleep quality. She plans to adjust her Buspar intake to twice daily.         02/16/2024   11:28 AM 02/17/2023   12:21 PM 12/01/2018    8:45 AM  Depression screen PHQ 2/9  Decreased Interest 1 1 1   Down, Depressed, Hopeless 1 2 1   PHQ - 2 Score 2 3 2   Altered sleeping 3 2 1   Tired, decreased energy 2 1 0  Change in appetite 1 2 0  Feeling bad or failure about yourself  1 1 1   Trouble concentrating 1 1 0  Moving slowly or fidgety/restless 0  0  Suicidal thoughts 0 0 0  PHQ-9 Score 10 10 4   Difficult doing work/chores Somewhat difficult Somewhat difficult Somewhat difficult      02/16/2024   11:28 AM 02/17/2023   12:21 PM  GAD 7 : Generalized Anxiety Score  Nervous, Anxious, on Edge 3 2  Control/stop worrying 3 3  Worry too much - different things 2 3  Trouble relaxing 2 2  Restless 1 2  Easily annoyed or irritable 2 2  Afraid - awful might happen 2 3  Total GAD 7 Score 15 17  Anxiety Difficulty Somewhat difficult Somewhat difficult        Relevant past medical, surgical, family, and social history reviewed and updated as indicated.  Allergies and medications reviewed and updated. Data reviewed:  Chart in Epic.   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 02/02/2014   Had ascus with +HPV will get colpo   Abnormal Pap smear    Abnormal uterine bleeding (AUB) 10/11/2014   Anxiety    Asthma    Depression    Dysmenorrhea 10/11/2014   Headache    HSV-2 (herpes simplex virus 2) infection    Hx of migraines 02/09/2014   Has ?aura, will rx POP   Ilioinguinal neuralgia    left   Meningitis    Migraine    Other and unspecified ovarian cyst 02/06/2014   Ovarian cyst 10/18/2014   Panic attack    Thickened endometrium 10/18/2014   Unspecified  symptom associated with female genital organs 01/27/2014   Vaginal Pap smear, abnormal    Vulvar abscess 03/09/2014   [facility]&D 3/9 in ER [facility] was rx'd bactrim ds x 10 days    Past Surgical History:  Procedure Laterality Date   ABDOMINAL HYSTERECTOMY     APPENDECTOMY     CESAREAN SECTION     CHOLECYSTECTOMY     DILATION AND CURETTAGE OF UTERUS     LAPAROSCOPIC SUPRACERVICAL HYSTERECTOMY N/A 01/02/2015   Procedure: ATTEMPTED LAPAROSCOPIC SUPRACERVICAL HYSTERECTOMY CONVERTED TO OPEN AT 9604;  Surgeon: Tilda Burrow, MD;  Location: [facility];  Service: Gynecology;  Laterality: N/A;   OVARIAN CYST REMOVAL     SCAR REVISION N/A 06/26/2015   Procedure: ABDOMINAL SCAR REVISION;  Surgeon: Tilda Burrow, MD;  Location: [facility];  Service: Gynecology;  Laterality: N/A;   SUPRACERVICAL ABDOMINAL HYSTERECTOMY N/A 01/02/2015   Procedure: HYSTERECTOMY SUPRACERVICAL ABDOMINAL;  Surgeon: Tilda Burrow, MD;  Location: [facility];  Service: Gynecology;  Laterality: N/A;    Social History   Socioeconomic History   Marital status: Divorced    Spouse name: Not on file   Number of children: 1   Years of education: Not on file   Highest education level: GED or equivalent  Occupational History   Not on file  Tobacco Use   Smoking status: Every Day    Current packs/day: 0.00    Types: E-cigarettes, Cigarettes    Last attempt to quit: 1998    Years since quitting: 27.1    Passive exposure: Never   Smokeless tobacco: Never  Vaping Use   Vaping status: Every Day   Substances: Flavoring  Substance and Sexual Activity   Alcohol use: Yes    Comment: socially   Drug use: No   Sexual activity: Not Currently    Partners: Male    Birth control/protection: Surgical    Comment: hyst  Other Topics Concern   Not on file  Social History Narrative   Not on file   Social Drivers of Health   Financial Resource Strain: Low Risk  (02/16/2024)   Overall Financial Resource Strain (CARDIA)    Difficulty of  Paying Living Expenses: Not very hard  Food Insecurity: No Food Insecurity (02/16/2024)   Hunger Vital Sign    Worried About Running Out of Food in the Last Year: Never true    Ran Out of Food in the Last Year: Never true  Transportation Needs: No Transportation Needs (02/16/2024)   PRAPARE - Administrator, Civil Service (Medical): No    Lack of Transportation (Non-Medical): No  Physical Activity: Insufficiently Active (02/16/2024)   Exercise Vital Sign    Days of Exercise per Week:  2 days    Minutes of Exercise per Session: 30 min  Stress: Stress Concern Present (02/16/2024)   [employer] of Occupational Health - Occupational Stress Questionnaire    Feeling of Stress : Rather much  Social Connections: Moderately Integrated (02/16/2024)   Social Connection and Isolation Panel [NHANES]    Frequency of Communication with Friends and Family: Twice a week    Frequency of Social Gatherings with Friends and Family: Once a week    Attends Religious Services: More than 4 times per year    Active Member of [employer] or Organizations: No    Attends Banker Meetings: Not on file    Marital Status: Living with partner  Intimate Partner Violence: Not on file    Outpatient Encounter Medications as of 02/16/2024  Medication Sig   butalbital-acetaminophen-caffeine (FIORICET) 50-325-40 MG tablet Take 1 tablet by mouth 4 (four) times daily as needed for headache or migraine.   furosemide (LASIX) 20 MG tablet Take 20 mg by mouth 4 (four) times daily.   ondansetron (ZOFRAN-ODT) 4 MG disintegrating tablet Take 1 tablet (4 mg total) by mouth every 8 (eight) hours as needed for nausea or vomiting.   OVER THE COUNTER MEDICATION Womens daily vit   OVER THE COUNTER MEDICATION Hair, skin & nails   OVER THE COUNTER MEDICATION Glucosamine econdrodin   Rimegepant Sulfate (NURTEC) 75 MG TBDP Take 1 tablet (75 mg total) by mouth every other day.   rosuvastatin (CRESTOR) 10 MG tablet Take 10  mg by mouth daily.   Semaglutide-Weight Management (WEGOVY) 2.4 MG/0.75ML SOAJ Inject 2.4 mg into the skin once a week.   Turmeric (QC TUMERIC COMPLEX PO) Take by mouth.   VENTOLIN HFA 108 (90 Base) MCG/ACT inhaler Inhale 1-2 puffs into the lungs every 4 (four) hours as needed.   Vitamin D, Ergocalciferol, (DRISDOL) 1.25 MG (50000 UNIT) CAPS capsule Take 50,000 Units by mouth once a week.   [DISCONTINUED] busPIRone (BUSPAR) 7.5 MG tablet Take 1 tablet (7.5 mg total) by mouth 2 (two) times daily. **NEEDS TO BE SEEN BEFORE NEXT REFILL**   [DISCONTINUED] DULoxetine (CYMBALTA) 20 MG capsule Take 1 capsule (20 mg total) by mouth 2 (two) times daily. **NEEDS TO BE SEEN BEFORE NEXT REFILL**   busPIRone (BUSPAR) 7.5 MG tablet Take 1 tablet (7.5 mg total) by mouth 2 (two) times daily. **NEEDS TO BE SEEN BEFORE NEXT REFILL**   DULoxetine (CYMBALTA) 20 MG capsule Take 1 capsule (20 mg total) by mouth 2 (two) times daily. **NEEDS TO BE SEEN BEFORE NEXT REFILL**   [DISCONTINUED] citalopram (CELEXA) 20 MG tablet Take 20 mg by mouth daily.     [EXPIRED] ketorolac (TORADOL) 30 MG/ML injection 30 mg    No facility-administered encounter medications on file as of 02/16/2024.    Allergies  Allergen Reactions   Coconut Flavoring Agent (Non-Screening) Anaphylaxis    Pertinent ROS per HPI, otherwise unremarkable      Objective:  BP 139/75   Pulse 75   Temp 97.7 F (36.5 C)   Ht 5\' 6"  (1.676 m)   Wt 276 lb 3.2 oz (125.3 kg)   LMP  (LMP Unknown)   SpO2 100%   BMI 44.58 kg/m    Wt Readings from Last 3 Encounters:  02/16/24 276 lb 3.2 oz (125.3 kg)  02/04/24 274 lb 12.8 oz (124.6 kg)  01/21/24 274 lb 8 oz (124.5 kg)    Physical Exam Vitals and nursing note reviewed.  Constitutional:      Appearance:  Normal appearance. She is morbidly obese.  HENT:     Head: Normocephalic and atraumatic.     Nose: Nose normal.     Mouth/Throat:     Mouth: Mucous membranes are moist.     Pharynx: Oropharynx is  clear.  Eyes:     Conjunctiva/sclera: Conjunctivae normal.     Pupils: Pupils are equal, round, and reactive to light.  Cardiovascular:     Rate and Rhythm: Normal rate and regular rhythm.     Heart sounds: Normal heart sounds.  Pulmonary:     Effort: Pulmonary effort is normal.     Breath sounds: Normal breath sounds.  Musculoskeletal:     Cervical back: Neck supple.  Skin:    General: Skin is warm and dry.     Capillary Refill: Capillary refill takes less than 2 seconds.  Neurological:     General: No focal deficit present.     Mental Status: She is alert and oriented to person, place, and time.  Psychiatric:        Mood and Affect: Mood normal.        Behavior: Behavior normal. Behavior is cooperative.        Thought Content: Thought content normal.        Judgment: Judgment normal.     Results for orders placed or performed in visit on 02/17/23  CMP14+EGFR   Collection Time: 02/17/23 12:40 PM  Result Value Ref Range   Glucose 90 70 - 99 mg/dL   BUN 9 6 - 24 mg/dL   Creatinine, Ser 3.08 (L) 0.57 - 1.00 mg/dL   eGFR 657 >84 ON/GEX/5.28   BUN/Creatinine Ratio 18 9 - 23   Sodium 137 134 - 144 mmol/L   Potassium 4.7 3.5 - 5.2 mmol/L   Chloride 98 96 - 106 mmol/L   CO2 25 20 - 29 mmol/L   Calcium 9.7 8.7 - 10.2 mg/dL   Total Protein 7.4 6.0 - 8.5 g/dL   Albumin 4.5 3.9 - 4.9 g/dL   Globulin, Total 2.9 1.5 - 4.5 g/dL   Albumin/Globulin Ratio 1.6 1.2 - 2.2   Bilirubin Total 0.5 0.0 - 1.2 mg/dL   Alkaline Phosphatase 73 44 - 121 IU/L   AST 34 0 - 40 IU/L   ALT 49 (H) 0 - 32 IU/L  CBC with Differential/Platelet   Collection Time: 02/17/23 12:40 PM  Result Value Ref Range   WBC 6.2 3.4 - 10.8 x10E3/uL   RBC 4.52 3.77 - 5.28 x10E6/uL   Hemoglobin 12.2 11.1 - 15.9 g/dL   Hematocrit 41.3 24.4 - 46.6 %   MCV 82 79 - 97 fL   MCH 27.0 26.6 - 33.0 pg   MCHC 33.0 31.5 - 35.7 g/dL   RDW 01.0 27.2 - 53.6 %   Platelets 288 150 - 450 x10E3/uL   Neutrophils 59 Not Estab. %    Lymphs 26 Not Estab. %   Monocytes 7 Not Estab. %   Eos 6 Not Estab. %   Basos 1 Not Estab. %   Neutrophils Absolute 3.7 1.4 - 7.0 x10E3/uL   Lymphocytes Absolute 1.6 0.7 - 3.1 x10E3/uL   Monocytes Absolute 0.5 0.1 - 0.9 x10E3/uL   EOS (ABSOLUTE) 0.4 0.0 - 0.4 x10E3/uL   Basophils Absolute 0.1 0.0 - 0.2 x10E3/uL   Immature Granulocytes 1 Not Estab. %   Immature Grans (Abs) 0.0 0.0 - 0.1 x10E3/uL  Brain natriuretic peptide   Collection Time: 02/17/23 12:40 PM  Result Value Ref  Range   BNP 11.1 0.0 - 100.0 pg/mL  CA 125   Collection Time: 02/17/23 12:40 PM  Result Value Ref Range   Cancer Antigen (CA) 125 4.5 0.0 - 38.1 U/mL  Thyroid Panel With TSH   Collection Time: 02/17/23 12:40 PM  Result Value Ref Range   TSH 2.800 0.450 - 4.500 uIU/mL   T4, Total 10.9 4.5 - 12.0 ug/dL   T3 Uptake Ratio 25 24 - 39 %   Free Thyroxine Index 2.7 1.2 - 4.9  Hgb A1c w/o eAG   Collection Time: 02/17/23 12:40 PM  Result Value Ref Range   Hgb A1c MFr Bld 5.6 4.8 - 5.6 %  Specimen status report   Collection Time: 02/17/23 12:40 PM  Result Value Ref Range   specimen status report Comment        Pertinent labs & imaging results that were available during my care of the patient were reviewed by me and considered in my medical decision making.  Assessment & Plan:  Dorothee was seen today for migraine.  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 -     ketorolac (TORADOL) 30 MG/ML injection 30 mg  Generalized anxiety disorder -     busPIRone (BUSPAR) 7.5 MG tablet; Take 1 tablet (7.5 mg total) by mouth 2 (two) times daily. **NEEDS TO BE SEEN BEFORE NEXT REFILL** -     DULoxetine (CYMBALTA) 20 MG capsule; Take 1 capsule (20 mg total) by mouth 2 (two) times daily. **NEEDS TO BE SEEN BEFORE NEXT REFILL**     Assessment and  Plan    Migraine Headaches Chronic migraines with 10-12 headache days per month. Previous treatments (Fioricet, Topamax, Imitrex, beta blockers) were ineffective or had intolerable side effects. Nurtec has been effective with minimal side effects (no brain fog or drowsiness). Experiences nausea managed with Zofran, and light/sound sensitivity. Discussed Nurtec as a preventative treatment with every other day dosing. Recommended Excedrin Tension for abortive therapy due to lack of NSAIDs, which can cause rebound headaches. Emphasized hydration and maintaining a headache diary. Plan to reassess in 2-3 months. - Administer Toradol injection today - Prescribe Nurtec every other day for prevention - Recommend Excedrin Tension for abortive therapy - Encourage hydration - Maintain a headache diary - Follow-up in 2-3 months  Generalized Anxiety Disorder Currently on duloxetine and buspirone (Buspar). Reports effectiveness but has been taking Buspar once daily, contributing to sleep disturbances and nighttime anxiety. Advised to take Buspar twice daily for better symptom management. - Refill duloxetine 20 mg twice daily - Refill buspirone with instructions to take twice daily  Follow-up - Follow-up in 2-3 months - Contact office if symptoms worsen before follow-up.          Continue all other maintenance medications.  Follow up plan: Return in about 3 months (around 05/15/2024), or if symptoms worsen or fail to improve, for migraine .   Continue healthy lifestyle choices, including diet (rich in fruits, vegetables, and lean proteins, and low in salt and simple carbohydrates) and exercise (at least 30 minutes of moderate physical activity daily).  Educational handout given for migraine headaches, headache diary, Nurtec  The above assessment and management plan was discussed with the patient. The patient verbalized understanding of and has agreed to the management plan. Patient is aware to call  the clinic if they develop any new symptoms or if symptoms persist or worsen. Patient is aware when to return to the clinic for a follow-up visit. Patient educated on when it is appropriate to go to the emergency department.   Kari Baars, FNP-C [facility] [phone]

## 2024-02-18 ENCOUNTER — Telehealth: Payer: Self-pay

## 2024-02-18 NOTE — Telephone Encounter (Signed)
 Pharmacy Patient Advocate Encounter   Received notification from Onbase that prior authorization for Nurtec 75MG  dispersible tablets is required/requested.   Insurance verification completed.   The patient is insured through [facility] .   Per test claim: PA required; PA submitted to above mentioned insurance via CoverMyMeds Key/confirmation #/EOC [id number] Status is pending

## 2024-02-19 ENCOUNTER — Encounter: Payer: Self-pay | Admitting: Family Medicine

## 2024-02-19 ENCOUNTER — Other Ambulatory Visit: Payer: Self-pay | Admitting: Family Medicine

## 2024-02-22 NOTE — Telephone Encounter (Signed)
 Pharmacy Patient Advocate Encounter  Received notification from [facility] that Prior Authorization for NURTEC ODT 75MG  TABLETS has been DENIED.  Full denial letter will be uploaded to the media tab. See denial reason below.   PA #/Case ID/Reference #: [id number]

## 2024-02-23 ENCOUNTER — Telehealth: Payer: Self-pay

## 2024-02-23 ENCOUNTER — Other Ambulatory Visit (HOSPITAL_COMMUNITY): Payer: Self-pay

## 2024-02-23 ENCOUNTER — Telehealth: Payer: Self-pay | Admitting: Pharmacist

## 2024-02-23 DIAGNOSIS — Z8669 Personal history of other diseases of the nervous system and sense organs: Secondary | ICD-10-CM

## 2024-02-23 MED ORDER — EMGALITY 120 MG/ML ~~LOC~~ SOAJ: 240.0000 mg | Freq: Once | SUBCUTANEOUS | 0 refills | Status: AC

## 2024-02-23 MED ORDER — EMGALITY 120 MG/ML ~~LOC~~ SOAJ
120.0000 mg | SUBCUTANEOUS | 5 refills | Status: DC
Start: 2024-02-23 — End: 2024-03-30

## 2024-02-23 NOTE — Telephone Encounter (Signed)
 lmtcb

## 2024-02-23 NOTE — Telephone Encounter (Signed)
 Pharmacy Patient Advocate Encounter   Received notification from CoverMyMeds that prior authorization for Emgality 120MG /ML auto-injectors is required/requested.   Insurance verification completed.   The patient is insured through [facility] .   Per test claim: PA required; PA submitted to above mentioned insurance via CoverMyMeds Key/confirmation #/EOC [id number] Status is pending

## 2024-02-23 NOTE — Telephone Encounter (Signed)
 Patient okay with switching to Emgality per insurance Loading dose of 240mg  then followed by 120mg  every 30 days Discussed with PCP

## 2024-02-24 ENCOUNTER — Encounter: Payer: Self-pay | Admitting: Family Medicine

## 2024-02-24 NOTE — Telephone Encounter (Signed)
  Patient is currently on an SNRI and SSRI for other comorbidities She is not a candidate for a beta blocker, anti-epileptic, CCB or ACEi/ARB due to blood pressure, heart rate and other comorbid conditions. Please appeal

## 2024-02-26 ENCOUNTER — Encounter: Payer: Self-pay | Admitting: Family Medicine

## 2024-02-29 NOTE — Telephone Encounter (Signed)
 lmtcb

## 2024-02-29 NOTE — Telephone Encounter (Signed)
 PCP responded to patient in another Mychart encounter. Please see other encounter

## 2024-03-01 ENCOUNTER — Telehealth: Payer: Self-pay | Admitting: Pharmacist

## 2024-03-01 NOTE — Telephone Encounter (Signed)
 I'm not seeing where the patient has MOH (med overuse headache) She does not  Am I missing something? Did not see that button anywhere on the original PA either Thanks, sorry this is such a mess.

## 2024-03-01 NOTE — Telephone Encounter (Signed)
 Pharmacy Patient Advocate Encounter  Received notification from [facility] that Prior Authorization for Emgality 120MG /ML auto-injectors (migraine) has been DENIED.  See denial reason below. No denial letter attached in CMM. Will attach denial letter to Media tab once received.   PA #/Case ID/Reference #: [id number]

## 2024-03-02 ENCOUNTER — Telehealth: Payer: Self-pay | Admitting: Pharmacist

## 2024-03-02 NOTE — Telephone Encounter (Signed)
 Appeal has been submitted for Emgality. Will advise when response is received, please be advised that most companies may take 30 days to make a decision. Appeal letter and supporting documentation have been faxed [phone] on 03/02/2024 @2 :15 pm  Thank you, Dellie Burns, PharmD Clinical Pharmacist  [facility]  Direct Dial: [phone]

## 2024-03-07 ENCOUNTER — Other Ambulatory Visit: Payer: Self-pay | Admitting: Family Medicine

## 2024-03-07 NOTE — Telephone Encounter (Signed)
 Patient has been called several times with no call back . This encounter will be closed.

## 2024-03-16 ENCOUNTER — Other Ambulatory Visit (HOSPITAL_COMMUNITY): Payer: Self-pay

## 2024-03-16 ENCOUNTER — Telehealth: Payer: Self-pay | Admitting: Pharmacist

## 2024-03-16 NOTE — Telephone Encounter (Signed)
 Can we f/u appeal; patient called in  I know 30 days, but usually medicaid is sooner Thanks!

## 2024-03-16 NOTE — Telephone Encounter (Signed)
 Patient never called/scheduled by lymphedema clinic in [place] from last year Can you f/u referral and reach out to patient to let her know how to proceed? Is new referral needed? Thank you!

## 2024-03-22 ENCOUNTER — Other Ambulatory Visit: Payer: Self-pay | Admitting: Family Medicine

## 2024-03-30 ENCOUNTER — Encounter: Payer: Self-pay | Admitting: Family Medicine

## 2024-03-30 ENCOUNTER — Telehealth: Payer: Self-pay | Admitting: Pharmacist

## 2024-03-30 DIAGNOSIS — Z8669 Personal history of other diseases of the nervous system and sense organs: Secondary | ICD-10-CM

## 2024-03-30 MED ORDER — AIMOVIG 70 MG/ML ~~LOC~~ SOAJ
70.0000 mg | SUBCUTANEOUS | 30 refills | Status: DC
Start: 2024-03-30 — End: 2024-04-14

## 2024-03-30 MED ORDER — AIMOVIG 70 MG/ML ~~LOC~~ SOAJ: 70.0000 mg | SUBCUTANEOUS | 30 refills | Status: DC

## 2024-03-30 NOTE — Telephone Encounter (Signed)
 [place] PA for Medicaid Emgality was appealed but is still pending

## 2024-03-31 ENCOUNTER — Telehealth: Payer: Self-pay | Admitting: Pharmacist

## 2024-03-31 MED ORDER — WEGOVY 2.4 MG/0.75ML ~~LOC~~ SOAJ: 2.4000 mg | SUBCUTANEOUS | 5 refills | Status: DC

## 2024-03-31 NOTE — Telephone Encounter (Signed)
 Refills for wegovy sent in  Patient stable on current dose F/u with PCP next month

## 2024-03-31 NOTE — Telephone Encounter (Signed)
  Patient notified Pharmacy to order

## 2024-03-31 NOTE — Telephone Encounter (Signed)
 PLEASE BE ADVISED APPROVAL LETTER HAS BEEN SCANNED IN MEDIA OF CHART   Melanee Spry CPhT Rx Patient Advocate [phone] (F) [phone]

## 2024-04-05 ENCOUNTER — Other Ambulatory Visit (HOSPITAL_COMMUNITY): Payer: Self-pay

## 2024-04-06 ENCOUNTER — Other Ambulatory Visit (HOSPITAL_COMMUNITY): Payer: Self-pay

## 2024-04-06 NOTE — Telephone Encounter (Signed)
 The appeal for Emgality was denied for the following reason:     The appeal letter very clearly stated:  She has been evaluated and it was determined her headaches are not medication overuse headaches.  Please let me know how you would like to proceed.  The denial letter has been uploaded to the patient's media file.

## 2024-04-07 NOTE — Telephone Encounter (Signed)
 Patient aware and verbalizes understanding.

## 2024-04-08 ENCOUNTER — Other Ambulatory Visit: Payer: Self-pay | Admitting: Family Medicine

## 2024-04-14 ENCOUNTER — Telehealth: Payer: Self-pay | Admitting: Pharmacist

## 2024-04-14 DIAGNOSIS — Z8669 Personal history of other diseases of the nervous system and sense organs: Secondary | ICD-10-CM

## 2024-04-14 MED ORDER — AIMOVIG 140 MG/ML ~~LOC~~ SOAJ: 140.0000 mg | SUBCUTANEOUS | 5 refills | Status: DC

## 2024-04-14 NOTE — Telephone Encounter (Signed)
 Patient reports dose increase may be necessary.  Aimovig does not appear to be working as well as Nurtec, but will increase dose to determine if more efficacious.  Patient counseling provided.

## 2024-04-23 ENCOUNTER — Other Ambulatory Visit: Payer: Self-pay | Admitting: Family Medicine

## 2024-04-29 ENCOUNTER — Encounter: Payer: Self-pay | Admitting: Pharmacist

## 2024-04-29 ENCOUNTER — Other Ambulatory Visit (INDEPENDENT_AMBULATORY_CARE_PROVIDER_SITE_OTHER): Payer: Self-pay | Admitting: Pharmacist

## 2024-04-29 MED ORDER — TIRZEPATIDE-WEIGHT MANAGEMENT 5 MG/0.5ML ~~LOC~~ SOLN: 5.0000 mg | SUBCUTANEOUS | 0 refills | Status: DC

## 2024-04-29 MED ORDER — TIRZEPATIDE-WEIGHT MANAGEMENT 7.5 MG/0.5ML ~~LOC~~ SOLN: 7.5000 mg | SUBCUTANEOUS | 0 refills | Status: DC

## 2024-04-29 NOTE — Progress Notes (Unsigned)
 04/29/2024 Name: Barbara Jennings MRN: [id number] DOB: Sep 29, 1981  Chief Complaint  Patient presents with   Obesity    WEGOVY  TO ZEPBOUND SWITCH    Barbara Jennings is a 43 y.o. year old female who presented for a CLINIC visit.   They were referred to the pharmacist by their PCP for assistance in managing weight management.   They were referred to the pharmacist by their PCP for assistance in managing  obesity .  Patient's baseline BMI WAS 44.55 kg/m2.  Her current weight  remains at 274lbs.  She is ready to transition to Zepbound for additional weight loss and cardiac support (significant family cardiac history).  She has lost over 40 lbs and >5% of her baseline body weight.  She is enrolled in a diet/exercise plan (working to eat healthy).  The patient denies personal or family history of medullary thyroid  carcinoma (MTC) or in patients with Multiple Endocrine Neoplasia syndrome type 2 (MEN 2).   Subjective:  Care Team: Primary Care Provider: Galvin Jules, FNP {careteamprovider:[id number]}  Medication Access/Adherence  Current Pharmacy:  [facility] [place], [place] - [id number] [place] [address] Phone: [phone] Fax: [phone]  Patient reports affordability concerns with their medications: No  Patient reports access/transportation concerns to their pharmacy: No  Patient reports adherence concerns with their medications:  No    Obesity/Overweight   Weight Management treatments previously prescribed: wegovy    Current meal patterns:  The patient is asked to make an attempt to improve diet and exercise patterns to aid in medical management of this problem. Discussed meal planning options and Plate method for healthy eating Avoid sugary drinks and desserts Incorporate balanced protein, non starchy veggies, 1 serving of carbohydrate with each meal Increase water intake Increase physical activity as able   Current physical activity:  encouraged as able   Current medication access support: n/a; medicaid   Current weight: 274.5lbs; BMI 44.31 (baseline BMI was 50.97)     Objective:  Lab Results  Component Value Date   HGBA1C 5.6 02/17/2023    Lab Results  Component Value Date   CREATININE 0.51 (L) 02/17/2023   BUN 9 02/17/2023   NA 137 02/17/2023   K 4.7 02/17/2023   CL 98 02/17/2023   CO2 25 02/17/2023   Medications Reviewed Today     Reviewed by Delilah Fend, [facility] (Pharmacist) on 04/29/24 at [phone]  Med List Status: <None>   Medication Order Taking? Sig Documenting Provider Last Dose Status Informant  busPIRone  (BUSPAR ) 7.5 MG tablet [id number]  Take 1 tablet (7.5 mg total) by mouth 2 (two) times daily. **NEEDS TO BE SEEN BEFORE NEXT REFILL** Rakes, Georgeann Kindred, FNP  Active   butalbital-acetaminophen -caffeine (FIORICET) 50-325-40 MG tablet [id number] No Take 1 tablet by mouth 4 (four) times daily as needed for headache or migraine. [provider] Taking Active Self  Discontinued 03/04/12 2154 (Non-compliance) DULoxetine  (CYMBALTA ) 20 MG capsule [id number]  Take 1 capsule (20 mg total) by mouth 2 (two) times daily. **NEEDS TO BE SEEN BEFORE NEXT REFILL** Rakes, Georgeann Kindred, FNP  Active   Erenumab -aooe (AIMOVIG ) 140 MG/ML SOAJ [id number]  Inject 140 mg into the skin every 30 (thirty) days. Galvin Jules, FNP  Active   furosemide (LASIX) 20 MG tablet [id number] No Take 20 mg by mouth 4 (four) times daily. [provider] Taking Active Self  ondansetron  (ZOFRAN -ODT) 4 MG disintegrating tablet [id number]  DIX ONE TAB IN THE TONGUE EVERY 8 HOURS  AS NEEDED FOR NAUSEA AND VOMITING Galvin Jules, FNP  Active   OVER THE COUNTER MEDICATION [id number] No Womens daily vit [provider] Taking Active   OVER THE COUNTER MEDICATION [id number] No Hair, skin & nails [provider] Taking Active   OVER THE COUNTER MEDICATION [id number] No Glucosamine econdrodin [provider] Taking Active    rosuvastatin (CRESTOR) 10 MG tablet [id number] No Take 10 mg by mouth daily. [provider] Taking Active   Turmeric (QC TUMERIC COMPLEX PO) [id number] No Take by mouth. [provider] Taking Active   VENTOLIN  HFA 108 (90 Base) MCG/ACT inhaler [id number] No Inhale 1-2 puffs into the lungs every 4 (four) hours as needed. [provider] Taking Active   Vitamin D, Ergocalciferol, (DRISDOL) 1.25 MG (50000 UNIT) CAPS capsule [id number] No Take 50,000 Units by mouth once a week. [provider] Taking Active   Med List Note Myrl Askew, RN 06/12/17 1201): Ke            Assessment/Plan:   Assessment/Plan:    Obesity/Overweight: - Currently unable to achieve goal weight loss of 5-10% through diet and lifestyle modifications alone - Extensive dietary counseling including education on focus on lean proteins, fruits and vegetables, whole grains and increased fiber consumption, adequate hydration - Extensive exercise counseling including eventual goal of 150 minutes of moderate intensity exercise weekly - Provided motivational interviewing. Discussed setting non-weight based goals - Recommend to: Transition to Zepbound 5mg  weekly Patient meets ALL criteria set forth for Zepbound by [place] medicaid and the FDA approval for obesity BMI decreased to>44.22 Current weight 274lbs (>40lb weight loss) Family cardiac history patient denies personal or family history of medullary thyroid  carcinoma (MTC) or in patients with Multiple Endocrine Neoplasia syndrome type 2 (MEN 2)     Marvell Slider, PharmD, BCACP, CPP Clinical Pharmacist, [facility]

## 2024-05-02 ENCOUNTER — Other Ambulatory Visit (HOSPITAL_COMMUNITY): Payer: Self-pay

## 2024-05-05 ENCOUNTER — Telehealth: Payer: Self-pay | Admitting: Pharmacist

## 2024-05-18 ENCOUNTER — Ambulatory Visit: Payer: Medicaid Other | Admitting: Family Medicine

## 2024-05-18 ENCOUNTER — Encounter: Payer: Self-pay | Admitting: Family Medicine

## 2024-05-18 VITALS — BP 133/74 | HR 70 | Temp 97.7°F | Ht 66.0 in | Wt 289.6 lb

## 2024-05-18 DIAGNOSIS — G43719 Chronic migraine without aura, intractable, without status migrainosus: Secondary | ICD-10-CM | POA: Insufficient documentation

## 2024-05-18 NOTE — Progress Notes (Signed)
 Subjective:  Patient ID: Barbara Jennings, female    DOB: 1981-05-21, 43 y.o.   MRN: [id number]  Patient Care Team: Galvin Jules, FNP as PCP - General (Family Medicine)   Chief Complaint:  Migraine (3 month follow up )   HPI: Barbara Jennings is a 43 y.o. female presenting on 05/18/2024 for Migraine (3 month follow up )   Barbara Jennings is a 43 year old female with migraines who presents for follow-up on headache management.  She has started Aimovig , initially at a lower dose and then increased to a higher dose, which has reduced her headache days to approximately eight per month. The headaches are now less frequent and shorter in duration. However, she experienced rebound headaches when she first started the medication, with headaches increasing as the medication wore off before the next dose.  She has not identified specific triggers for her headaches but suspects lighting may be a factor. She uses blue blocker glasses to help mitigate this. For breakthrough headaches, she isolates herself, uses Tylenol  or ibuprofen , and applies a cold compress to her head and neck. She experiences nausea with headaches, for which she takes Zofran , which provides relief.  She has been unable to obtain Nurtec due to insurance issues but has been using Fioricet for abortive therapy. She recently switched from Wegovy  to Zepbound for weight management, noting that Zepbound better controls her appetite without the weight gain she experienced on Wegovy .          Relevant past medical, surgical, family, and social history reviewed and updated as indicated.  Allergies and medications reviewed and updated. Data reviewed: Chart in Epic.   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 02/02/2014   Had ascus with +HPV will get colpo   Abnormal Pap smear    Abnormal uterine bleeding (AUB) 10/11/2014   Anxiety    Asthma     Depression    Dysmenorrhea 10/11/2014   Headache    HSV-2 (herpes simplex virus 2) infection    Hx of migraines 02/09/2014   Has ?aura, will rx POP   Ilioinguinal neuralgia    left   Meningitis    Migraine    Other and unspecified ovarian cyst 02/06/2014   Ovarian cyst 10/18/2014   Panic attack    Thickened endometrium 10/18/2014   Unspecified symptom associated with female genital organs 01/27/2014   Vaginal Pap smear, abnormal    Vulvar abscess 03/09/2014   [facility] 3/9 in [facility] was rx'd bactrim ds x 10 days    Past Surgical History:  Procedure Laterality Date   ABDOMINAL HYSTERECTOMY     APPENDECTOMY     CESAREAN SECTION     CHOLECYSTECTOMY     DILATION AND CURETTAGE OF UTERUS     LAPAROSCOPIC SUPRACERVICAL HYSTERECTOMY N/A 01/02/2015   Procedure: ATTEMPTED LAPAROSCOPIC SUPRACERVICAL HYSTERECTOMY CONVERTED TO OPEN AT 7846;  Surgeon: Albino Hum, MD;  Location: [facility];  Service: Gynecology;  Laterality: N/A;   OVARIAN CYST REMOVAL     SCAR REVISION N/A 06/26/2015   Procedure: ABDOMINAL SCAR REVISION;  Surgeon: Albino Hum, MD;  Location: [facility];  Service: Gynecology;  Laterality: N/A;   SUPRACERVICAL ABDOMINAL HYSTERECTOMY N/A 01/02/2015   Procedure: HYSTERECTOMY SUPRACERVICAL ABDOMINAL;  Surgeon: Albino Hum, MD;  Location: [facility];  Service: Gynecology;  Laterality: N/A;    Social History   Socioeconomic History  Marital status: Divorced    Spouse name: Not on file   Number of children: 1   Years of education: Not on file   Highest education level: GED or equivalent  Occupational History   Not on file  Tobacco Use   Smoking status: Every Day    Current packs/day: 0.00    Types: E-cigarettes, Cigarettes    Last attempt to quit: 1998    Years since quitting: 27.4    Passive exposure: Never   Smokeless tobacco: Never  Vaping Use   Vaping status: Every Day   Substances: Flavoring  Substance and Sexual Activity   Alcohol use: Yes    Comment:  socially   Drug use: No   Sexual activity: Not Currently    Partners: Male    Birth control/protection: Surgical    Comment: hyst  Other Topics Concern   Not on file  Social History Narrative   Not on file   Social Drivers of Health   Financial Resource Strain: Low Risk  (02/16/2024)   Overall Financial Resource Strain (CARDIA)    Difficulty of Paying Living Expenses: Not very hard  Food Insecurity: No Food Insecurity (02/16/2024)   Hunger Vital Sign    Worried About Running Out of Food in the Last Year: Never true    Ran Out of Food in the Last Year: Never true  Transportation Needs: No Transportation Needs (02/16/2024)   PRAPARE - Administrator, Civil Service (Medical): No    Lack of Transportation (Non-Medical): No  Physical Activity: Insufficiently Active (02/16/2024)   Exercise Vital Sign    Days of Exercise per Week: 2 days    Minutes of Exercise per Session: 30 min  Stress: Stress Concern Present (02/16/2024)   [employer] of Occupational Health - Occupational Stress Questionnaire    Feeling of Stress : Rather much  Social Connections: Moderately Integrated (02/16/2024)   Social Connection and Isolation Panel [NHANES]    Frequency of Communication with Friends and Family: Twice a week    Frequency of Social Gatherings with Friends and Family: Once a week    Attends Religious Services: More than 4 times per year    Active Member of [employer] or Organizations: No    Attends Banker Meetings: Not on file    Marital Status: Living with partner  Intimate Partner Violence: Not on file    Outpatient Encounter Medications as of 05/18/2024  Medication Sig   busPIRone  (BUSPAR ) 7.5 MG tablet Take 1 tablet (7.5 mg total) by mouth 2 (two) times daily. **NEEDS TO BE SEEN BEFORE NEXT REFILL**   DULoxetine  (CYMBALTA ) 20 MG capsule Take 1 capsule (20 mg total) by mouth 2 (two) times daily. **NEEDS TO BE SEEN BEFORE NEXT REFILL**   Erenumab -aooe (AIMOVIG ) 140  MG/ML SOAJ Inject 140 mg into the skin every 30 (thirty) days.   furosemide (LASIX) 20 MG tablet Take 20 mg by mouth 4 (four) times daily.   ondansetron  (ZOFRAN -ODT) 4 MG disintegrating tablet DIX ONE TAB IN THE TONGUE EVERY 8 HOURS AS NEEDED FOR NAUSEA AND VOMITING   OVER THE COUNTER MEDICATION Womens daily vit   OVER THE COUNTER MEDICATION Hair, skin & nails   OVER THE COUNTER MEDICATION Glucosamine econdrodin   rosuvastatin (CRESTOR) 10 MG tablet Take 10 mg by mouth daily.   tirzepatide 5 MG/0.5ML injection vial Inject 5 mg into the skin once a week.   tirzepatide 7.5 MG/0.5ML injection vial Inject 7.5 mg into the skin once  a week.   Turmeric (QC TUMERIC COMPLEX PO) Take by mouth.   VENTOLIN  HFA 108 (90 Base) MCG/ACT inhaler Inhale 1-2 puffs into the lungs every 4 (four) hours as needed.   Vitamin D, Ergocalciferol, (DRISDOL) 1.25 MG (50000 UNIT) CAPS capsule Take 50,000 Units by mouth once a week.   [DISCONTINUED] butalbital-acetaminophen -caffeine (FIORICET) 50-325-40 MG tablet Take 1 tablet by mouth 4 (four) times daily as needed for headache or migraine.   [DISCONTINUED] citalopram (CELEXA) 20 MG tablet Take 20 mg by mouth daily.     No facility-administered encounter medications on file as of 05/18/2024.    Allergies  Allergen Reactions   Coconut Flavoring Agent (Non-Screening) Anaphylaxis    Pertinent ROS per HPI, otherwise unremarkable      Objective:  BP 133/74   Pulse 70   Temp 97.7 F (36.5 C)   Ht 5\' 6"  (1.676 m)   Wt 289 lb 9.6 oz (131.4 kg)   LMP  (LMP Unknown)   SpO2 100%   BMI 46.74 kg/m    Wt Readings from Last 3 Encounters:  05/18/24 289 lb 9.6 oz (131.4 kg)  04/29/24 274 lb (124.3 kg)  02/16/24 276 lb 3.2 oz (125.3 kg)    Physical Exam Vitals and nursing note reviewed.  Constitutional:      General: She is not in acute distress.    Appearance: Normal appearance. She is morbidly obese. She is not ill-appearing, toxic-appearing or diaphoretic.  HENT:      Head: Normocephalic and atraumatic.     Nose: Nose normal.     Mouth/Throat:     Mouth: Mucous membranes are moist.     Pharynx: Oropharynx is clear.  Eyes:     Conjunctiva/sclera: Conjunctivae normal.     Pupils: Pupils are equal, round, and reactive to light.  Cardiovascular:     Rate and Rhythm: Normal rate and regular rhythm.     Heart sounds: Normal heart sounds.  Pulmonary:     Effort: Pulmonary effort is normal.     Breath sounds: Normal breath sounds.  Musculoskeletal:     Right lower leg: Edema present.     Left lower leg: Edema present.  Skin:    General: Skin is warm and dry.     Capillary Refill: Capillary refill takes less than 2 seconds.  Neurological:     General: No focal deficit present.     Mental Status: She is alert and oriented to person, place, and time.  Psychiatric:        Mood and Affect: Mood normal.        Behavior: Behavior normal. Behavior is cooperative.        Thought Content: Thought content normal.        Judgment: Judgment normal.     Results for orders placed or performed in visit on 02/17/23  CMP14+EGFR   Collection Time: 02/17/23 12:40 PM  Result Value Ref Range   Glucose 90 70 - 99 mg/dL   BUN 9 6 - 24 mg/dL   Creatinine, Ser 6.57 (L) 0.57 - 1.00 mg/dL   eGFR 846 >96 EX/BMW/4.13   BUN/Creatinine Ratio 18 9 - 23   Sodium 137 134 - 144 mmol/L   Potassium 4.7 3.5 - 5.2 mmol/L   Chloride 98 96 - 106 mmol/L   CO2 25 20 - 29 mmol/L   Calcium 9.7 8.7 - 10.2 mg/dL   Total Protein 7.4 6.0 - 8.5 g/dL   Albumin 4.5 3.9 - 4.9 g/dL  Globulin, Total 2.9 1.5 - 4.5 g/dL   Albumin/Globulin Ratio 1.6 1.2 - 2.2   Bilirubin Total 0.5 0.0 - 1.2 mg/dL   Alkaline Phosphatase 73 44 - 121 IU/L   AST 34 0 - 40 IU/L   ALT 49 (H) 0 - 32 IU/L  CBC with Differential/Platelet   Collection Time: 02/17/23 12:40 PM  Result Value Ref Range   WBC 6.2 3.4 - 10.8 x10E3/uL   RBC 4.52 3.77 - 5.28 x10E6/uL   Hemoglobin 12.2 11.1 - 15.9 g/dL   Hematocrit  16.1 09.6 - 46.6 %   MCV 82 79 - 97 fL   MCH 27.0 26.6 - 33.0 pg   MCHC 33.0 31.5 - 35.7 g/dL   RDW 04.5 40.9 - 81.1 %   Platelets 288 150 - 450 x10E3/uL   Neutrophils 59 Not Estab. %   Lymphs 26 Not Estab. %   Monocytes 7 Not Estab. %   Eos 6 Not Estab. %   Basos 1 Not Estab. %   Neutrophils Absolute 3.7 1.4 - 7.0 x10E3/uL   Lymphocytes Absolute 1.6 0.7 - 3.1 x10E3/uL   Monocytes Absolute 0.5 0.1 - 0.9 x10E3/uL   EOS (ABSOLUTE) 0.4 0.0 - 0.4 x10E3/uL   Basophils Absolute 0.1 0.0 - 0.2 x10E3/uL   Immature Granulocytes 1 Not Estab. %   Immature Grans (Abs) 0.0 0.0 - 0.1 x10E3/uL  Brain natriuretic peptide   Collection Time: 02/17/23 12:40 PM  Result Value Ref Range   BNP 11.1 0.0 - 100.0 pg/mL  CA 125   Collection Time: 02/17/23 12:40 PM  Result Value Ref Range   Cancer Antigen (CA) 125 4.5 0.0 - 38.1 U/mL  Thyroid  Panel With TSH   Collection Time: 02/17/23 12:40 PM  Result Value Ref Range   TSH 2.800 0.450 - 4.500 uIU/mL   T4, Total 10.9 4.5 - 12.0 ug/dL   T3 Uptake Ratio 25 24 - 39 %   Free Thyroxine Index 2.7 1.2 - 4.9  Hgb A1c w/o eAG   Collection Time: 02/17/23 12:40 PM  Result Value Ref Range   Hgb A1c MFr Bld 5.6 4.8 - 5.6 %  Specimen status report   Collection Time: 02/17/23 12:40 PM  Result Value Ref Range   specimen status report Comment        Pertinent labs & imaging results that were available during my care of the patient were reviewed by me and considered in my medical decision making.  Assessment & Plan:  Barbara Jennings was seen today for migraine.  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  Morbid obesity (HCC) -     CBC with Differential/Platelet -     CMP14+EGFR -     Lipid panel -     Thyroid  Panel With TSH -     VITAMIN D 25 Hydroxy (Vit-D Deficiency, Fractures)       Chronic Migraine Chronic migraines with a reduction in headache days to  approximately eight per month after starting Aimovig . Initial rebound headaches occurred with Aimovig  initiation, but improvement was noted with a higher dose. Lighting is suspected as a potential trigger. Nausea is a significant symptom during headaches, managed with Zofran . Current abortive therapy includes Fioricet, but insurance issues have prevented the use of Nurtec. A sample pack of Nurtec will be provided for abortive therapy to assess its effectiveness. - Provide sample pack of Nurtec for abortive therapy. - Continue Aimovig   for migraine prevention. - Use Tylenol  or ibuprofen  for breakthrough pain. - Use Zofran  for nausea associated with migraines. - Advise use of cold compress for headache relief.  Obesity Obesity management with tirzepatide (Zepbound) showing better appetite control compared to previous medication (Wegovy ). Monitoring of liver function is necessary due to potential interference by Zepbound. - Order thyroid , renal, and liver function tests.        Continue all other maintenance medications.  Follow up plan: Return in about 3 months (around 08/18/2024), or if symptoms worsen or fail to improve, for BMI.   Continue healthy lifestyle choices, including diet (rich in fruits, vegetables, and lean proteins, and low in salt and simple carbohydrates) and exercise (at least 30 minutes of moderate physical activity daily).  Educational handout given for migraine headaches   The above assessment and management plan was discussed with the patient. The patient verbalized understanding of and has agreed to the management plan. Patient is aware to call the clinic if they develop any new symptoms or if symptoms persist or worsen. Patient is aware when to return to the clinic for a follow-up visit. Patient educated on when it is appropriate to go to the emergency department.   Kattie Parrot, FNP-C [facility] [phone]

## 2024-05-19 ENCOUNTER — Encounter: Payer: Self-pay | Admitting: Family Medicine

## 2024-05-19 ENCOUNTER — Ambulatory Visit: Payer: Self-pay | Admitting: Family Medicine

## 2024-05-19 LAB — CMP14+EGFR
ALT: 21 IU/L (ref 0–32)
AST: 22 IU/L (ref 0–40)
Albumin: 4.1 g/dL (ref 3.9–4.9)
Alkaline Phosphatase: 83 IU/L (ref 44–121)
BUN/Creatinine Ratio: 17 (ref 9–23)
BUN: 9 mg/dL (ref 6–24)
Bilirubin Total: 0.5 mg/dL (ref 0.0–1.2)
CO2: 24 mmol/L (ref 20–29)
Calcium: 8.9 mg/dL (ref 8.7–10.2)
Chloride: 101 mmol/L (ref 96–106)
Creatinine, Ser: 0.54 mg/dL — ABNORMAL LOW (ref 0.57–1.00)
Globulin, Total: 2.9 g/dL (ref 1.5–4.5)
Glucose: 101 mg/dL — ABNORMAL HIGH (ref 70–99)
Potassium: 4.2 mmol/L (ref 3.5–5.2)
Sodium: 139 mmol/L (ref 134–144)
Total Protein: 7 g/dL (ref 6.0–8.5)
eGFR: 118 mL/min/{1.73_m2} (ref >59)

## 2024-05-19 LAB — CBC WITH DIFFERENTIAL/PLATELET
Basophils Absolute: 0.1 10*3/uL (ref 0.0–0.2)
Basos: 1 %
EOS (ABSOLUTE): 0.3 10*3/uL (ref 0.0–0.4)
Eos: 5 %
Hematocrit: 32.4 % — ABNORMAL LOW (ref 34.0–46.6)
Hemoglobin: 10.8 g/dL — ABNORMAL LOW (ref 11.1–15.9)
Immature Grans (Abs): 0 10*3/uL (ref 0.0–0.1)
Immature Granulocytes: 0 %
Lymphocytes Absolute: 1.4 10*3/uL (ref 0.7–3.1)
Lymphs: 27 %
MCH: 27.3 pg (ref 26.6–33.0)
MCHC: 33.3 g/dL (ref 31.5–35.7)
MCV: 82 fL (ref 79–97)
Monocytes Absolute: 0.3 10*3/uL (ref 0.1–0.9)
Monocytes: 6 %
Neutrophils Absolute: 3.2 10*3/uL (ref 1.4–7.0)
Neutrophils: 61 %
Platelets: 305 10*3/uL (ref 150–450)
RBC: 3.95 x10E6/uL (ref 3.77–5.28)
RDW: 13.3 % (ref 11.7–15.4)
WBC: 5.2 10*3/uL (ref 3.4–10.8)

## 2024-05-19 LAB — THYROID PANEL WITH TSH
Free Thyroxine Index: 2.9 (ref 1.2–4.9)
T3 Uptake Ratio: 27 % (ref 24–39)
T4, Total: 10.9 ug/dL (ref 4.5–12.0)
TSH: 2.4 u[IU]/mL (ref 0.450–4.500)

## 2024-05-19 LAB — LIPID PANEL
Chol/HDL Ratio: 4.5 ratio — ABNORMAL HIGH (ref 0.0–4.4)
Cholesterol, Total: 184 mg/dL (ref 100–199)
HDL: 41 mg/dL (ref >39)
LDL Chol Calc (NIH): 104 mg/dL — ABNORMAL HIGH (ref 0–99)
Triglycerides: 228 mg/dL — ABNORMAL HIGH (ref 0–149)
VLDL Cholesterol Cal: 39 mg/dL (ref 5–40)

## 2024-05-19 LAB — VITAMIN D 25 HYDROXY (VIT D DEFICIENCY, FRACTURES): Vit D, 25-Hydroxy: 15 ng/mL — ABNORMAL LOW (ref 30.0–100.0)

## 2024-05-19 MED ORDER — VITAMIN D (ERGOCALCIFEROL) 1.25 MG (50000 UNIT) PO CAPS: 50000.0000 [IU] | ORAL_CAPSULE | ORAL | 1 refills | Status: DC

## 2024-05-19 NOTE — Addendum Note (Signed)
 Addended by: Wynelle Heather on: 05/19/2024 01:28 PM   Modules accepted: Orders

## 2024-05-26 ENCOUNTER — Other Ambulatory Visit (HOSPITAL_COMMUNITY): Payer: Self-pay

## 2024-05-26 ENCOUNTER — Other Ambulatory Visit (INDEPENDENT_AMBULATORY_CARE_PROVIDER_SITE_OTHER): Admitting: Pharmacist

## 2024-05-26 ENCOUNTER — Telehealth: Payer: Self-pay

## 2024-05-26 MED ORDER — TIRZEPATIDE 10 MG/0.5ML ~~LOC~~ SOAJ
10.0000 mg | SUBCUTANEOUS | 2 refills | Status: DC
Start: 2024-05-26 — End: 2024-05-26

## 2024-05-26 MED ORDER — TIRZEPATIDE-WEIGHT MANAGEMENT 10 MG/0.5ML ~~LOC~~ SOAJ: 10.0000 mg | SUBCUTANEOUS | 2 refills | Status: DC

## 2024-05-26 NOTE — Telephone Encounter (Signed)
 Please archive and don't complete PA for mounjaro  [facility] clinical okay to delete message Will send in Zepbound  No further action or PA needed

## 2024-05-26 NOTE — Addendum Note (Signed)
 Addended by: Fred Hammes D on: 05/26/2024 05:37 PM   Modules accepted: Orders

## 2024-05-26 NOTE — Progress Notes (Signed)
 05/26/2024 Name: Barbara Jennings MRN: [id number] DOB: 1981/10/25  Chief Complaint  Patient presents with   Obesity    Barbara Jennings is a 43 y.o. year old female who presented for a telephone visit.   They were referred to the pharmacist by their PCP for assistance in managing medication access and weight management.    Subjective:  Patient's baseline BMI WAS 44.55 kg/m2.  Her current weight remains at 274lbs.  She is tolerating Zepbound  and reports less side effects She is ready to increase her Zepbound  dose for additional weight loss (significant family cardiac history).  She has lost over 40 lbs and >5% of her baseline body weight.  She is enrolled in a diet/exercise plan (working to eat healthy).  The patient denies personal or family history of medullary thyroid  carcinoma (MTC) or in patients with Multiple Endocrine Neoplasia syndrome type 2 (MEN 2).   Care Team: Primary Care Provider: Galvin Jules, FNP    Medication Access/Adherence  Current Pharmacy:  [facility] [place], [place] - [address] [place] [address] Phone: [phone] Fax: [phone]   Patient reports affordability concerns with their medications: No  Patient reports access/transportation concerns to their pharmacy: No  Patient reports adherence concerns with their medications:  No    Obesity/Overweight:  Current medications: Zepbound  7.5mg -->10mg   Weight Management treatments previously prescribed: Wegovy  2.4  Current meal patterns:  The patient is asked to make an attempt to improve diet and exercise patterns to aid in medical management of this problem. Discussed meal planning options and Plate method for healthy eating Avoid sugary drinks and desserts Incorporate balanced protein, non starchy veggies, 1 serving of carbohydrate with each meal Increase water intake Increase physical activity as able   Current physical activity: encouraged as able    Current medication access support: n/a; medicaid   Current weight: 274.5lbs; BMI 44.31 (baseline BMI was 50.97)  Objective:  Lab Results  Component Value Date   HGBA1C 5.6 02/17/2023    Lab Results  Component Value Date   CREATININE 0.54 (L) 05/18/2024   BUN 9 05/18/2024   NA 139 05/18/2024   K 4.2 05/18/2024   CL 101 05/18/2024   CO2 24 05/18/2024    Lab Results  Component Value Date   CHOL 184 05/18/2024   HDL 41 05/18/2024   LDLCALC 104 (H) 05/18/2024   TRIG 228 (H) 05/18/2024   CHOLHDL 4.5 (H) 05/18/2024    Medications Reviewed Today     Reviewed by Delilah Fend, [facility] (Pharmacist) on 05/26/24 at [phone]  Med List Status: <None>   Medication Order Taking? Sig Documenting Provider Last Dose Status Informant  busPIRone  (BUSPAR ) 7.5 MG tablet [id number] No Take 1 tablet (7.5 mg total) by mouth 2 (two) times daily. **NEEDS TO BE SEEN BEFORE NEXT REFILL** Galvin Jules, FNP Taking Active   Discontinued 03/04/12 2154 (Non-compliance) DULoxetine  (CYMBALTA ) 20 MG capsule [id number] No Take 1 capsule (20 mg total) by mouth 2 (two) times daily. **NEEDS TO BE SEEN BEFORE NEXT REFILL** Rakes, Georgeann Kindred, FNP Taking Active   Erenumab -aooe (AIMOVIG ) 140 MG/ML SOAJ [id number] No Inject 140 mg into the skin every 30 (thirty) days. Galvin Jules, FNP Taking Active   furosemide (LASIX) 20 MG tablet [id number] No Take 20 mg by mouth 4 (four) times daily. [provider] Taking Active Self  ondansetron  (ZOFRAN -ODT) 4 MG disintegrating tablet [id number] No DIX ONE TAB IN THE TONGUE EVERY 8 HOURS AS NEEDED  FOR NAUSEA AND VOMITING Galvin Jules, FNP Taking Active   OVER THE COUNTER MEDICATION [id number] No Womens daily vit [provider] Taking Active   OVER THE COUNTER MEDICATION [id number] No Hair, skin & nails [provider] Taking Active   OVER THE COUNTER MEDICATION [id number] No Glucosamine econdrodin [provider] Taking Active   rosuvastatin (CRESTOR)  10 MG tablet [id number] No Take 10 mg by mouth daily. [provider] Taking Active   tirzepatide [facility]) 10 MG/0.5ML Pen [id number] Yes Inject 10 mg into the skin once a week. Galvin Jules, FNP  Active   Turmeric (QC TUMERIC COMPLEX PO) [id number] No Take by mouth. [provider] Taking Active   VENTOLIN  HFA 108 (90 Base) MCG/ACT inhaler [id number] No Inhale 1-2 puffs into the lungs every 4 (four) hours as needed. [provider] Taking Active   Vitamin D, Ergocalciferol, (DRISDOL) 1.25 MG (50000 UNIT) CAPS capsule [id number]  Take 1 capsule (50,000 Units total) by mouth once a week. Galvin Jules, FNP  Active   Med List Note Randel Buss, Hayes Lipps, RN 06/12/17 1201): Ke             Assessment/Plan:   Obesity/Overweight: - Currently unable to achieve goal weight loss of 5-10% through diet and lifestyle modifications alone - Extensive dietary counseling including education on focus on lean proteins, fruits and vegetables, whole grains and increased fiber consumption, adequate hydration - Extensive exercise counseling including eventual goal of 150 minutes of moderate intensity exercise weekly - Provided motivational interviewing. Discussed setting non-weight based goals - Recommend to: INCREASE to Zepbound 10mg  weekly Patient meets ALL criteria set forth for Zepbound by [place] medicaid and the FDA approval for obesity BMI decreased to-->44.22 Current weight 274lbs (>40lb weight loss) Family cardiac history patient denies personal or family history of medullary thyroid  carcinoma (MTC) or in patients with Multiple Endocrine Neoplasia syndrome type 2 (MEN 2)  Follow Up Plan: 1-2 months   Marvell Slider, PharmD, BCACP, CPP Clinical Pharmacist, [facility]

## 2024-05-26 NOTE — Telephone Encounter (Signed)
 Ozempic/Mounjaro  is approved exclusively as an adjunct to diet and exercise to improve glycemic control in adults with type 2 diabetes mellitus. A review of patient's medical chart reveals no documented diagnosis of type 2 diabetes or an A1C indicative of diabetes. Therefore, they do not currently meet the criteria for prior authorization of this medication. If clinically appropriate, alternative options such as Saxenda, Zepbound , or Wegovy  may be considered for this patient.  Patient chart notes say she was tolerating Zepbound  well. Mounjaro  is the prescription sent

## 2024-05-27 ENCOUNTER — Telehealth: Payer: Self-pay

## 2024-05-27 ENCOUNTER — Telehealth: Payer: Self-pay | Admitting: Pharmacist

## 2024-05-27 ENCOUNTER — Other Ambulatory Visit (HOSPITAL_COMMUNITY): Payer: Self-pay

## 2024-05-27 NOTE — Telephone Encounter (Signed)
 Pharmacy Patient Advocate Encounter   Received notification from Physician's Office that prior authorization for Zepbound 10 is required/requested.   Insurance verification completed.   The patient is insured through [facility] .   Per test claim: unable to run test bill. [facility] to re-run with [facility] and [facility]. Prior authorization is not needed at this time per Cover My Meds

## 2024-05-27 NOTE — Telephone Encounter (Signed)
No PA needed at this time ?

## 2024-05-27 NOTE — Telephone Encounter (Signed)
NURTEC PA

## 2024-05-30 ENCOUNTER — Other Ambulatory Visit (HOSPITAL_COMMUNITY): Payer: Self-pay

## 2024-05-30 NOTE — Telephone Encounter (Signed)
 Pharmacy Patient Advocate Encounter  Received notification from [facility] that Prior Authorization for Nurtec 75 mg ODT tablets has been DENIED.  Full denial letter will be uploaded to the media tab. See denial reason below.   PA #/Case ID/Reference #: Barbara [id number]

## 2024-05-31 ENCOUNTER — Telehealth: Payer: Self-pay | Admitting: Pharmacy Technician

## 2024-05-31 ENCOUNTER — Other Ambulatory Visit (HOSPITAL_COMMUNITY): Payer: Self-pay

## 2024-05-31 NOTE — Telephone Encounter (Signed)
 Pharmacy Patient Advocate Encounter   Received notification from CoverMyMeds that prior authorization for Aimovig  70MG /ML auto-injectors is due for renewal.   Insurance verification completed.   The patient is insured through [facility].  Action: Medication is now available without a prior authorization.  **Previous PA expires 06/28/2024.**

## 2024-06-08 ENCOUNTER — Other Ambulatory Visit: Payer: Self-pay | Admitting: Family Medicine

## 2024-06-13 ENCOUNTER — Other Ambulatory Visit (HOSPITAL_COMMUNITY): Payer: Self-pay

## 2024-06-13 ENCOUNTER — Telehealth: Payer: Self-pay

## 2024-06-13 NOTE — Telephone Encounter (Signed)
 Pharmacy Patient Advocate Encounter   Received notification from CoverMyMeds that prior authorization for Zepbound  is required/requested.   Insurance verification completed.   The patient is insured through [facility] .   Per test claim: Refill too soon. PA is not needed at this time. Medication was filled 05/31/24. Next eligible fill date is 06/21/24.

## 2024-06-17 ENCOUNTER — Other Ambulatory Visit (HOSPITAL_COMMUNITY): Payer: Self-pay

## 2024-06-17 ENCOUNTER — Telehealth: Payer: Self-pay

## 2024-06-17 ENCOUNTER — Other Ambulatory Visit (INDEPENDENT_AMBULATORY_CARE_PROVIDER_SITE_OTHER): Payer: Self-pay | Admitting: Pharmacist

## 2024-06-17 DIAGNOSIS — Z7985 Long-term (current) use of injectable non-insulin antidiabetic drugs: Secondary | ICD-10-CM | POA: Diagnosis not present

## 2024-06-17 MED ORDER — ZEPBOUND 12.5 MG/0.5ML ~~LOC~~ SOAJ: 12.5000 mg | SUBCUTANEOUS | 2 refills | Status: DC

## 2024-06-17 NOTE — Telephone Encounter (Signed)
 Pharmacy Patient Advocate Encounter   Received notification from CoverMyMeds that prior authorization for Zepbound  12.5MG /0.5ML pen-injectors  is required/requested.   Insurance verification completed.   The patient is insured through [facility] .   Per test claim: PA required; PA started via CoverMyMeds. KEY [id number] . Waiting for clinical questions to populate.

## 2024-06-17 NOTE — Progress Notes (Signed)
 06/17/2024 Name: Barbara Jennings MRN: [id number]       DOB: 01/09/1981      Chief Complaint  Patient presents with   Obesity      Barbara Jennings is a 43 y.o. year old female who presented for a telephone visit.  I connected with  Barbara Jennings on 06/17/24 by telephone and verified that I am speaking with the correct person using two identifiers.  I discussed the limitations of evaluation and management by telemedicine. The patient expressed understanding and agreed to proceed.  Patient was located in her home and PharmD in PCP office during this visit.    They were referred to the pharmacist by their PCP for assistance in managing medication access and weight management.      Subjective:   Patient's baseline BMI WAS 44.55 kg/m2.  Her current weight remains at 274lbs.  She is tolerating Zepbound  and reports less side effects than when she was on Wegovy .  She is ready to titrate her Zepbound  dose for additional supportive outcomes (significant family cardiac history).  She has lost over 43 lbs and >5% of her baseline body weight.  She is enrolled in a diet/exercise plan (working to eat healthy).  The patient denies personal or family history of medullary thyroid  carcinoma (MTC) or in patients with Multiple Endocrine Neoplasia syndrome type 2 (MEN 2).    Care Team: Primary Care Provider: Severa Rock CHRISTELLA, FNP      Medication Access/Adherence   Current Pharmacy:  [facility] [place], [place] - [address] [place] [address] Phone: [phone] Fax: [phone]     Patient reports affordability concerns with their medications: No  Patient reports access/transportation concerns to their pharmacy: No  Patient reports adherence concerns with their medications:  No     Obesity/Overweight:   Current medications: Zepbound  10mg -->12.5mg  weekly   Weight Management treatments previously prescribed: Wegovy  2.4   Current meal patterns:  The  patient is asked to make an attempt to improve diet and exercise patterns to aid in medical management of this problem. Discussed meal planning options and Plate method for healthy eating Avoid sugary drinks and desserts Incorporate balanced protein, non starchy veggies, 1 serving of carbohydrate with each meal Increase water intake Increase physical activity as able   Current physical activity: encouraged as able   Current medication access support: n/a; medicaid   Current weight: 274.5lbs; BMI 44.31 (baseline BMI was 50.97)   Objective:   Recent Labs       Lab Results  Component Value Date    HGBA1C 5.6 02/17/2023        Recent Labs       Lab Results  Component Value Date    CREATININE 0.54 (L) 05/18/2024    BUN 9 05/18/2024    NA 139 05/18/2024    K 4.2 05/18/2024    CL 101 05/18/2024    CO2 24 05/18/2024        Recent Labs       Lab Results  Component Value Date    CHOL 184 05/18/2024    HDL 41 05/18/2024    LDLCALC 104 (H) 05/18/2024    TRIG 228 (H) 05/18/2024    CHOLHDL 4.5 (H) 05/18/2024        Medications Reviewed Today       Reviewed by Barbara Jennings, [facility] (Pharmacist) on 05/26/24 at [phone]  Med List Status: <None>    Medication Order Taking?  Sig Documenting Provider Last Dose Status Informant  busPIRone  (BUSPAR ) 7.5 MG tablet [id number] No Take 1 tablet (7.5 mg total) by mouth 2 (two) times daily. **NEEDS TO BE SEEN BEFORE NEXT REFILL** Barbara Rock HERO, FNP Taking Active    Discontinued 03/04/12 2154 (Non-compliance) DULoxetine  (CYMBALTA ) 20 MG capsule [id number] No Take 1 capsule (20 mg total) by mouth 2 (two) times daily. **NEEDS TO BE SEEN BEFORE NEXT REFILL** Rakes, Rock HERO, FNP Taking Active    Erenumab -aooe (AIMOVIG ) 140 MG/ML SOAJ [id number] No Inject 140 mg into the skin every 30 (thirty) days. Barbara Rock HERO, FNP Taking Active    furosemide (LASIX) 20 MG tablet [id number] No Take 20 mg by mouth 4 (four) times daily. [provider] Taking  Active Self  ondansetron  (ZOFRAN -ODT) 4 MG disintegrating tablet [id number] No DIX ONE TAB IN THE TONGUE EVERY 8 HOURS AS NEEDED FOR NAUSEA AND VOMITING Rakes, Rock HERO, FNP Taking Active    OVER THE COUNTER MEDICATION [id number] No Womens daily vit [provider] Taking Active    OVER THE COUNTER MEDICATION [id number] No Hair, skin & nails [provider] Taking Active    OVER THE COUNTER MEDICATION [id number] No Glucosamine econdrodin [provider] Taking Active    rosuvastatin (CRESTOR) 10 MG tablet [id number] No Take 10 mg by mouth daily. [provider] Taking Active    tirzepatide  (MOUNJARO ) 10 MG/0.5ML Pen [id number] Yes Inject 10 mg into the skin once a week. Barbara Rock HERO, FNP   Active    Turmeric (QC TUMERIC COMPLEX PO) [id number] No Take by mouth. [provider] Taking Active    VENTOLIN  HFA 108 (90 Base) MCG/ACT inhaler [id number] No Inhale 1-2 puffs into the lungs every 4 (four) hours as needed. [provider] Taking Active    Vitamin D , Ergocalciferol , (DRISDOL ) 1.25 MG (50000 UNIT) CAPS capsule [id number]   Take 1 capsule (50,000 Units total) by mouth once a week. Barbara Rock HERO, FNP   Active    Med List Note Barbara Jennings, Barbara Jennings HERO, RN 06/12/17 1201): Ke                    Assessment/Plan:    Obesity/Overweight: - Currently unable to achieve goal weight loss of 5-10% through diet and lifestyle modifications alone - Extensive dietary counseling including education on focus on lean proteins, fruits and vegetables, whole grains and increased fiber consumption, adequate hydration - Extensive exercise counseling including eventual goal of 150 minutes of moderate intensity exercise weekly - Provided motivational interviewing. Discussed setting non-weight based goals - Recommend to: INCREASE to Zepbound  12.5mg  weekly Patient meets ALL criteria set forth for Zepbound  by [place] medicaid and the FDA approval for obesity BMI decreased  to-->44.22 Current weight 274lbs (>40lb weight loss) Family cardiac history patient denies personal or family history of medullary thyroid  carcinoma (MTC) or in patients with Multiple Endocrine Neoplasia syndrome type 2 (MEN 2)   Follow Up Plan: 1-2 months     Mliss Tarry Griffin, PharmD, BCACP, CPP Clinical Pharmacist, [facility]

## 2024-06-20 ENCOUNTER — Other Ambulatory Visit (HOSPITAL_COMMUNITY): Payer: Self-pay

## 2024-06-20 NOTE — Telephone Encounter (Signed)
 Pharmacy Patient Advocate Encounter  Received notification from [facility] Medicaid that Prior Authorization for Zepbound  12.5MG /0.5ML pen-injectors  has been OUTCOME FROM PLAN MESSAGE from Plan The member recently filled this medication and will be able to return for their next refill according to their plan limits. PA NOT NEED

## 2024-06-22 ENCOUNTER — Encounter: Payer: Self-pay | Admitting: Family Medicine

## 2024-06-30 NOTE — Addendum Note (Signed)
 Addended by: Anikin Prosser D on: 06/30/2024 07:28 AM   Modules accepted: Level of Service

## 2024-07-05 ENCOUNTER — Telehealth: Payer: Self-pay | Admitting: Pharmacist

## 2024-07-05 NOTE — Telephone Encounter (Signed)
 Date: 07/05/2024 To: Dr. Rock Bruns Fax number: [phone] Subject: Pharmacy prior authorization request number [id number] Regarding member: [id number]; Barbara Jennings; 04-Mar-1981 Dear Dr. Rock Bruns: [employer] reviewed your request for AIMOVIG  140 MG/ML AUTOINJECTOR for the aboveidentified member, and it is approved as follows: AIMOVIG  140 MG/ML AUTOINJECTOR: quantity/billable units of 1 approved for 07/05/2024- 07/05/2025. Approved J code (if applicable): [phone] Thank you, [facility] Departmen

## 2024-07-05 NOTE — Telephone Encounter (Signed)
 Barbara Jennings

## 2024-07-05 NOTE — Telephone Encounter (Signed)
 The member recently filled this medication and will be able to return for their next refill according to their plan limits. Patient notified Fill too soon PA should still be active

## 2024-07-06 ENCOUNTER — Other Ambulatory Visit (HOSPITAL_COMMUNITY): Payer: Self-pay

## 2024-07-07 ENCOUNTER — Other Ambulatory Visit: Payer: Self-pay | Admitting: Family Medicine

## 2024-07-14 ENCOUNTER — Other Ambulatory Visit: Payer: Self-pay | Admitting: Family Medicine

## 2024-07-28 ENCOUNTER — Telehealth: Payer: Self-pay | Admitting: Pharmacist

## 2024-07-28 MED ORDER — ONDANSETRON 4 MG PO TBDP: ORAL_TABLET | ORAL | 5 refills | Status: AC

## 2024-07-28 NOTE — Telephone Encounter (Signed)
 Zofran  ODT needed for reported nausea from Zepbound  increased dose Encouraged small, low fat meals and increase water intake Encouraged increased physical activity as able

## 2024-07-31 ENCOUNTER — Other Ambulatory Visit: Payer: Self-pay | Admitting: Family Medicine

## 2024-07-31 DIAGNOSIS — F411 Generalized anxiety disorder: Secondary | ICD-10-CM

## 2024-08-05 ENCOUNTER — Encounter: Payer: Self-pay | Admitting: Family Medicine

## 2024-08-05 ENCOUNTER — Other Ambulatory Visit (INDEPENDENT_AMBULATORY_CARE_PROVIDER_SITE_OTHER)

## 2024-08-05 MED ORDER — ZEPBOUND 15 MG/0.5ML ~~LOC~~ SOAJ
15.0000 mg | SUBCUTANEOUS | 4 refills | Status: AC
Start: 2024-08-05 — End: ?

## 2024-08-05 NOTE — Progress Notes (Signed)
 08/05/2024 Name: Barbara Jennings MRN: [id number] DOB: 1981-02-01  Chief Complaint  Patient presents with   Obesity    Barbara Jennings is a 43 y.o. year old female who presented for weight management dose increase   They were referred to the pharmacist by their PCP for assistance in managing weight management.    Subjective:  Patient's baseline BMI WAS 50 kg/m2.  Her current weight has increased to 286lb.  She is tolerating Zepbound  and reports less side effects than when she was on Wegovy .  She is ready to titrate her Zepbound  dose for additional supportive outcomes (significant family cardiac history).  She has lost over 43 lbs and >5% of her baseline body weight.  She is enrolled in a diet/exercise plan (working to eat healthy).  The patient denies personal or family history of medullary thyroid  carcinoma (MTC) or in patients with Multiple Endocrine Neoplasia syndrome type 2 (MEN 2).   Care Team: Primary Care Provider: Severa Rock CHRISTELLA, FNP ; Next Scheduled Visit: 07/2024   Medication Access/Adherence  Current Pharmacy:  [facility] [place], [place] - [address] [place] [address] Phone: [phone] Fax: [phone]   Obesity/Overweight:   Current medications: Zepbound  12.5mg -->15mg  weekly   Weight Management treatments previously prescribed: Wegovy  2.4   Current meal patterns:  The patient is asked to make an attempt to improve diet and exercise patterns to aid in medical management of this problem. Discussed meal planning options and Plate method for healthy eating Avoid sugary drinks and desserts Incorporate balanced protein, non starchy veggies, 1 serving of carbohydrate with each meal Increase water intake Increase physical activity as able   Current physical activity: encouraged as able   Current medication access support: n/a; medicaid   Current weight: 286lbs; BMI 46(baseline BMI was 50.97)   Objective:  Lab  Results  Component Value Date   HGBA1C 5.6 02/17/2023    Lab Results  Component Value Date   CREATININE 0.54 (L) 05/18/2024   BUN 9 05/18/2024   NA 139 05/18/2024   K 4.2 05/18/2024   CL 101 05/18/2024   CO2 24 05/18/2024    Lab Results  Component Value Date   CHOL 184 05/18/2024   HDL 41 05/18/2024   LDLCALC 104 (H) 05/18/2024   TRIG 228 (H) 05/18/2024   CHOLHDL 4.5 (H) 05/18/2024    Medications Reviewed Today     Reviewed by Billee Mliss BIRCH, [facility] (Pharmacist) on 08/05/24 at 1224  Med List Status: <None>   Medication Order Taking? Sig Documenting Provider Last Dose Status Informant  busPIRone  (BUSPAR ) 7.5 MG tablet [id number] No Take 1 tablet (7.5 mg total) by mouth 2 (two) times daily. **NEEDS TO BE SEEN BEFORE NEXT REFILL** Severa Rock CHRISTELLA, FNP Taking Active   Discontinued 03/04/12 2154 (Non-compliance) DULoxetine  (CYMBALTA ) 20 MG capsule [id number]  Take 1 capsule (20 mg total) by mouth 2 (two) times daily. Severa Rock CHRISTELLA, FNP  Active   Erenumab -aooe (AIMOVIG ) 140 MG/ML SOAJ [id number] No Inject 140 mg into the skin every 30 (thirty) days. Severa Rock CHRISTELLA, FNP Taking Active   furosemide (LASIX) 20 MG tablet [id number] No Take 20 mg by mouth 4 (four) times daily. [provider] Taking Active Self  ondansetron  (ZOFRAN -ODT) 4 MG disintegrating tablet [id number]  DISSOLVE ONE TAB ON THE TONGUE EVERY EIGHT HOURS AS NEEDED FOR NAUSEA AND VOMITING Severa Rock CHRISTELLA, FNP  Active   OVER THE COUNTER MEDICATION [id number] No Womens daily vit [provider] Taking Active   OVER THE COUNTER MEDICATION [id number] No Hair, skin & nails [provider] Taking Active   OVER THE COUNTER MEDICATION [id number] No Glucosamine econdrodin [provider] Taking Active   rosuvastatin (CRESTOR) 10 MG tablet [id number] No Take 10 mg by mouth daily. [provider] Taking Active     Discontinued 08/05/24 0909   tirzepatide  (ZEPBOUND ) 15 MG/0.5ML Pen [id number] Yes  Inject 15 mg into the skin once a week. Severa Rock HERO, FNP  Active   Turmeric (QC TUMERIC COMPLEX PO) [id number] No Take by mouth. [provider] Taking Active   VENTOLIN  HFA 108 (90 Base) MCG/ACT inhaler [id number] No Inhale 1-2 puffs into the lungs every 4 (four) hours as needed. [provider] Taking Active   Vitamin D , Ergocalciferol , (DRISDOL ) 1.25 MG (50000 UNIT) CAPS capsule [id number]  Take 1 capsule (50,000 Units total) by mouth once a week. Severa Rock HERO, FNP  Active   Med List Note Amalia, Rutherford HERO, RN 06/12/17 1201): Ke              Assessment/Plan:   Obesity/Overweight: - Currently unable to achieve goal weight loss of 5-10% through diet and lifestyle modifications alone - Extensive dietary counseling including education on focus on lean proteins, fruits and vegetables, whole grains and increased fiber consumption, adequate hydration - Extensive exercise counseling including eventual goal of 150 minutes of moderate intensity exercise weekly - Provided motivational interviewing. Discussed setting non-weight based goals - Recommend to: INCREASE to Zepbound  15mg  weekly Patient meets ALL criteria set forth for Zepbound  by [place] medicaid and the FDA approval for obesity BMI increased Current weight 286lbs (still has achieved >40lb weight loss) Family cardiac history patient denies personal or family history of medullary thyroid  carcinoma (MTC) or in patients with Multiple Endocrine Neoplasia syndrome type 2 (MEN 2)   Follow Up Plan: 2 months  Mliss Tarry Griffin, PharmD, BCACP, CPP Clinical Pharmacist, [facility]

## 2024-08-18 ENCOUNTER — Telehealth: Admitting: Family Medicine

## 2024-08-24 ENCOUNTER — Telehealth: Payer: Self-pay

## 2024-08-24 ENCOUNTER — Telehealth (INDEPENDENT_AMBULATORY_CARE_PROVIDER_SITE_OTHER): Admitting: Family Medicine

## 2024-08-24 ENCOUNTER — Encounter: Payer: Self-pay | Admitting: Family Medicine

## 2024-08-24 ENCOUNTER — Other Ambulatory Visit (HOSPITAL_COMMUNITY): Payer: Self-pay

## 2024-08-24 DIAGNOSIS — G43719 Chronic migraine without aura, intractable, without status migrainosus: Secondary | ICD-10-CM

## 2024-08-24 DIAGNOSIS — Z6841 Body Mass Index (BMI) 40.0 and over, adult: Secondary | ICD-10-CM

## 2024-08-24 NOTE — Progress Notes (Signed)
 Virtual Visit via Video   I connected with patient on 08/24/24 at 0955 by a video enabled telemedicine application and verified that I am speaking with the correct person using two identifiers.  Location patient: Home Location provider: [facility] Persons participating in the virtual visit: Patient and Provider  I discussed the limitations of evaluation and management by telemedicine and the availability of in person appointments. The patient expressed understanding and agreed to proceed.  Subjective:   HPI:  Pt presents today for  Chief Complaint  Patient presents with   Weight Check   Headache   Barbara Jennings who presents for follow-up regarding her weight management and medication use.  She has been effectively using Nurtec and Movii to manage her migraines, which improves her functionality, particularly towards the end of the month. Despite the absence of menstrual cycles following a partial hysterectomy in 2021, she retains one ovary.  Her weight was 266 pounds last Friday, a decrease from 289 pounds in May. She began a 15 mg dose of Zepbound  in the second week of August and has been able to obtain it without issues. She experiences occasional nausea with this dosage, but it is infrequent. She previously lost weight with Wegovy  but regained it, and she is hopeful for sustained weight loss with Zepbound . No frequent nausea with the current medication dose.       ROS per HPI   Patient Active Problem List   Diagnosis Date Noted   Intractable chronic migraine without aura and without status migrainosus 05/18/2024   Lymphedema 02/17/2023   Morbid obesity (HCC) 06/01/2017   Ilioinguinal neuralgia of left side 07/14/2016   Knee pain, chronic 09/12/2014    Social History   Tobacco Use   Smoking status: Every Day    Current packs/day: 0.00    Types: E-cigarettes, Cigarettes    Last attempt to quit: 1998    Years  since quitting: 27.6    Passive exposure: Never   Smokeless tobacco: Never  Substance Use Topics   Alcohol use: Yes    Comment: socially    Current Outpatient Medications:    NURTEC 75 MG TBDP, Take 1 tablet by mouth every other day., Disp: , Rfl:    busPIRone  (BUSPAR ) 7.5 MG tablet, Take 1 tablet (7.5 mg total) by mouth 2 (two) times daily. **NEEDS TO BE SEEN BEFORE NEXT REFILL**, Disp: 180 tablet, Rfl: 1   DULoxetine  (CYMBALTA ) 20 MG capsule, Take 1 capsule (20 mg total) by mouth 2 (two) times daily., Disp: 180 capsule, Rfl: 0   Erenumab -aooe (AIMOVIG ) 140 MG/ML SOAJ, Inject 140 mg into the skin every 30 (thirty) days., Disp: 1.12 mL, Rfl: 5   furosemide (LASIX) 20 MG tablet, Take 20 mg by mouth 4 (four) times daily., Disp: , Rfl:    ondansetron  (ZOFRAN -ODT) 4 MG disintegrating tablet, DISSOLVE ONE TAB ON THE TONGUE EVERY EIGHT HOURS AS NEEDED FOR NAUSEA AND VOMITING, Disp: 60 tablet, Rfl: 5   OVER THE COUNTER MEDICATION, Womens daily vit, Disp: , Rfl:    OVER THE COUNTER MEDICATION, Hair, skin & nails, Disp: , Rfl:    OVER THE COUNTER MEDICATION, Glucosamine econdrodin, Disp: , Rfl:    rosuvastatin (CRESTOR) 10 MG tablet, Take 10 mg by mouth daily., Disp: , Rfl:    tirzepatide  (ZEPBOUND ) 15 MG/0.5ML Pen, Inject 15 mg into the skin once a week., Disp: 6 mL, Rfl: 4   Turmeric (QC TUMERIC COMPLEX PO),  Take by mouth., Disp: , Rfl:    VENTOLIN  HFA 108 (90 Base) MCG/ACT inhaler, Inhale 1-2 puffs into the lungs every 4 (four) hours as needed., Disp: , Rfl:    Vitamin D , Ergocalciferol , (DRISDOL ) 1.25 MG (50000 UNIT) CAPS capsule, Take 1 capsule (50,000 Units total) by mouth once a week., Disp: 5 capsule, Rfl: 6  Allergies  Allergen Reactions   Coconut Flavoring Agent (Non-Screening) Anaphylaxis    Objective:   Wt 266 lb (120.7 kg)   LMP  (LMP Unknown)   BMI 42.93 kg/m   Patient is well-developed, well-nourished in no acute distress.  Resting comfortably at home.  Head is  normocephalic, atraumatic.  No labored breathing.  Speech is clear and coherent with logical content.  Patient is alert and oriented at baseline.   Assessment and Plan:   Barbara Jennings was seen today for weight check and headache.  Diagnoses and all orders for this visit:  Morbid obesity (HCC)  Intractable chronic migraine without aura and without status migrainosus      Migraine Migraines are increasing in frequency towards the end of the month. Current treatment with Nurtec and Movii provides functional symptom management. Hormonal fluctuations may contribute to migraine frequency despite the absence of menstrual cycles due to partial hysterectomy. - Continue Nurtec and Movii for migraine management.  Obesity Current weight is 266 lbs, down from 289 lbs in May. She is on 15 mg of Zepbound  since the second week of August. Nausea is infrequent with the current dosing. Previous weight loss with Wegovy  was followed by weight regain, but there is optimism for sustained weight loss with Zepbound . Insurance coverage is crucial for continued treatment. Zepbound  is preferred over Wegovy  due to better success rates observed. - Continue Zepbound  15 mg. - Schedule follow-up in 3 months for labs to check thyroid  and kidney function.        Return in about 3 months (around 11/24/2024) for chronic follow up.  Barbara Bruns, [facility] [address] [place], [place] [address] [phone]  08/24/2024  Time spent with the patient: 12 minutes, of which >50% was spent in obtaining information about symptoms, reviewing previous labs, evaluations, and treatments, counseling about condition (please see the discussed topics above), and developing a plan to further investigate it; had a number of questions which I addressed.

## 2024-08-24 NOTE — Telephone Encounter (Signed)
 Pharmacy Patient Advocate Encounter   Received notification from CoverMyMeds that prior authorization for Nurtec 75MG  dispersible tablets is due for renewal.   Insurance verification completed.   The patient is insured through [employer].  Action: Refill too soon. PA is not needed at this time. Medication was filled 08/05/2024. Next eligible fill date is 08/29/2024. PLEASE BE ADVISED I SPOKE TO PHARMACY AND THEY DID OVERRIDE AND SAW THAT IT WAS JUST TO SOON PT IS APPROVED UNTIL 07/05/2025

## 2024-09-05 ENCOUNTER — Other Ambulatory Visit: Payer: Self-pay | Admitting: Family Medicine

## 2024-09-05 DIAGNOSIS — Z8669 Personal history of other diseases of the nervous system and sense organs: Secondary | ICD-10-CM

## 2024-10-14 ENCOUNTER — Other Ambulatory Visit (HOSPITAL_COMMUNITY): Payer: Self-pay

## 2024-10-24 ENCOUNTER — Telehealth: Payer: Self-pay | Admitting: Pharmacist

## 2024-10-24 NOTE — Telephone Encounter (Signed)
 PA needed Attempted to complete PA, however [place] Medicaid dropped obesity coverage Patient will need to have OSA or ASCVD to qualify for Zepbound  or Wegovy  respectively  Contacted patient to review details

## 2024-10-25 ENCOUNTER — Other Ambulatory Visit: Payer: Self-pay | Admitting: *Deleted

## 2024-10-25 DIAGNOSIS — F411 Generalized anxiety disorder: Secondary | ICD-10-CM

## 2024-10-25 MED ORDER — DULOXETINE HCL 20 MG PO CPEP: 20.0000 mg | ORAL_CAPSULE | Freq: Two times a day (BID) | ORAL | 0 refills | Status: AC

## 2024-11-02 ENCOUNTER — Other Ambulatory Visit: Payer: Self-pay | Admitting: *Deleted

## 2024-11-03 ENCOUNTER — Other Ambulatory Visit (HOSPITAL_COMMUNITY): Payer: Self-pay

## 2024-11-04 ENCOUNTER — Other Ambulatory Visit (HOSPITAL_COMMUNITY): Payer: Self-pay

## 2024-11-07 ENCOUNTER — Other Ambulatory Visit: Payer: Self-pay | Admitting: *Deleted

## 2024-11-07 DIAGNOSIS — Z8669 Personal history of other diseases of the nervous system and sense organs: Secondary | ICD-10-CM

## 2024-11-08 ENCOUNTER — Other Ambulatory Visit (HOSPITAL_COMMUNITY): Payer: Self-pay

## 2024-11-09 ENCOUNTER — Other Ambulatory Visit (HOSPITAL_COMMUNITY): Payer: Self-pay

## 2024-11-15 ENCOUNTER — Telehealth: Admitting: Emergency Medicine

## 2024-11-15 DIAGNOSIS — J069 Acute upper respiratory infection, unspecified: Secondary | ICD-10-CM | POA: Diagnosis not present

## 2024-11-15 MED ORDER — BENZONATATE 100 MG PO CAPS: 100.0000 mg | ORAL_CAPSULE | Freq: Two times a day (BID) | ORAL | 0 refills | Status: AC | PRN

## 2024-11-15 MED ORDER — AZELASTINE HCL 0.1 % NA SOLN: 2.0000 | Freq: Two times a day (BID) | NASAL | 0 refills | Status: AC

## 2024-11-15 NOTE — Patient Instructions (Signed)
 [name], thank you for joining [name], NP for today's virtual visit.  While this provider is not your primary care provider (PCP), if your PCP is located in our provider database this encounter information will be shared with them immediately following your visit.   A [facility] MyChart account gives you access to today's visit and all your visits, tests, and labs performed at [facility]  click here if you don't have a [facility] MyChart account or go to [email or web address]  Consent: (Patient) Barbara Jennings provided verbal consent for this virtual visit at the beginning of the encounter.  Current Medications:  Current Outpatient Medications:    azelastine (ASTELIN) 0.1 % nasal spray, Place 2 sprays into both nostrils 2 (two) times daily. Use in each nostril as directed, Disp: 30 mL, Rfl: 0   benzonatate (TESSALON) 100 MG capsule, Take 1 capsule (100 mg total) by mouth 2 (two) times daily as needed for cough., Disp: 20 capsule, Rfl: 0   busPIRone  (BUSPAR ) 7.5 MG tablet, Take 1 tablet (7.5 mg total) by mouth 2 (two) times daily. **NEEDS TO BE SEEN BEFORE NEXT REFILL**, Disp: 180 tablet, Rfl: 1   DULoxetine  (CYMBALTA ) 20 MG capsule, Take 1 capsule (20 mg total) by mouth 2 (two) times daily., Disp: 180 capsule, Rfl: 0   Erenumab -aooe (AIMOVIG ) 140 MG/ML SOAJ, Inject 140 mg into the skin every 30 (thirty) days. **NEEDS TO BE SEEN BEFORE NEXT REFILL**, Disp: 1 mL, Rfl: 0   furosemide (LASIX) 20 MG tablet, Take 20 mg by mouth 4 (four) times daily., Disp: , Rfl:    NURTEC 75 MG TBDP, Take 1 tablet by mouth every other day., Disp: , Rfl:    ondansetron  (ZOFRAN -ODT) 4 MG disintegrating tablet, DISSOLVE ONE TAB ON THE TONGUE EVERY EIGHT HOURS AS NEEDED FOR NAUSEA AND VOMITING, Disp: 60 tablet, Rfl: 5   OVER THE COUNTER MEDICATION, Womens daily vit, Disp: , Rfl:    OVER THE COUNTER MEDICATION, Hair, skin & nails, Disp: , Rfl:    OVER THE COUNTER MEDICATION, Glucosamine  econdrodin, Disp: , Rfl:    rosuvastatin (CRESTOR) 10 MG tablet, Take 10 mg by mouth daily., Disp: , Rfl:    tirzepatide  (ZEPBOUND ) 15 MG/0.5ML Pen, Inject 15 mg into the skin once a week., Disp: 6 mL, Rfl: 4   Turmeric (QC TUMERIC COMPLEX PO), Take by mouth., Disp: , Rfl:    VENTOLIN  HFA 108 (90 Base) MCG/ACT inhaler, INHALE 1-2 PUFFS EVERY 4 HOURS AS NEEDED., Disp: 18 g, Rfl: 11   Vitamin D , Ergocalciferol , (DRISDOL ) 1.25 MG (50000 UNIT) CAPS capsule, Take 1 capsule (50,000 Units total) by mouth once a week., Disp: 5 capsule, Rfl: 6   Medications ordered in this encounter:  Meds ordered this encounter  Medications   benzonatate (TESSALON) 100 MG capsule    Sig: Take 1 capsule (100 mg total) by mouth 2 (two) times daily as needed for cough.    Dispense:  20 capsule    Refill:  0   azelastine (ASTELIN) 0.1 % nasal spray    Sig: Place 2 sprays into both nostrils 2 (two) times daily. Use in each nostril as directed    Dispense:  30 mL    Refill:  0     *If you need refills on other medications prior to your next appointment, please contact your pharmacy*  Follow-Up: Call back or seek an in-person evaluation if the symptoms worsen or if the condition fails to improve as anticipated.  [facility] [phone]  Other Instructions  Try Dayquil instead of nyquil, even at night.   Try using saline irrigation, such as with a neti pot, several times a day while you are sick. Many neti pots come with salt packets premeasured to use to make saline. If you use your own salt, make sure it is kosher salt or sea salt (don't use table salt as it has iodine in it and you don't need that in your nose). Use distilled water to make saline. If you mix your own saline using your own salt, the recipe is 1/4 teaspoon salt in 1 cup warm water. Using saline irrigation can help prevent and treat sinus infections.    If you have been instructed to have an in-person evaluation today at a local  Urgent Care facility, please use the link below. It will take you to a list of all of our available [facility], including address, phone number and hours of operation. Please do not delay care.  [facility]  If you or a family member do not have a primary care provider, use the link below to schedule a visit and establish care. When you choose a [facility] primary care physician or advanced practice provider, you gain a long-term partner in health. Find a Primary Care Provider  Learn more about [facility]'s in-office and virtual care options: [facility] - Get Care Now

## 2024-11-15 NOTE — Progress Notes (Signed)
 Virtual Visit Consent   Barbara Jennings, you are scheduled for a virtual visit with a [facility] provider today. Just as with appointments in the office, your consent must be obtained to participate. Your consent will be active for this visit and any virtual visit you may have with one of our providers in the next 365 days. If you have a MyChart account, a copy of this consent can be sent to you electronically.  As this is a virtual visit, video technology does not allow for your provider to perform a traditional examination. This may limit your provider's ability to fully assess your condition. If your provider identifies any concerns that need to be evaluated in person or the need to arrange testing (such as labs, EKG, etc.), we will make arrangements to do so. Although advances in technology are sophisticated, we cannot ensure that it will always work on either your end or our end. If the connection with a video visit is poor, the visit may have to be switched to a telephone visit. With either a video or telephone visit, we are not always able to ensure that we have a secure connection.  By engaging in this virtual visit, you consent to the provision of healthcare and authorize for your insurance to be billed (if applicable) for the services provided during this visit. Depending on your insurance coverage, you may receive a charge related to this service.  I need to obtain your verbal consent now. Are you willing to proceed with your visit today? [name] has provided verbal consent on 11/15/2024 for a virtual visit (video or telephone). [name], NP  Date: 11/15/2024 4:08 PM   Virtual Visit via Video Note   I, [name], connected with  Barbara Jennings  ([id number], October 27, 1981) on 11/15/24 at  4:00 PM EST by a video-enabled telemedicine application and verified that I am speaking with the correct person using two identifiers.  Location: Patient: Virtual Visit Location Patient:  Other: work in [employer] Provider: Virtual Visit Location Provider: Home Office   I discussed the limitations of evaluation and management by telemedicine and the availability of in person appointments. The patient expressed understanding and agreed to proceed.    History of Present Illness: Barbara Jennings is a 43 y.o. who identifies as a female who was assigned female at birth, and is being seen today for sick since yesterday afternoon.   Stuffy, sore throat, coughing. No fever, headaches, body aches. No SOB or wheezing. Covid/flu home test negative.   Has tried mucinex and nyquil.   HPI: HPI  Problems:  Patient Active Problem List   Diagnosis Date Noted   Intractable chronic migraine without aura and without status migrainosus 05/18/2024   Lymphedema 02/17/2023   Morbid obesity (HCC) 06/01/2017   Ilioinguinal neuralgia of left side 07/14/2016   Knee pain, chronic 09/12/2014    Allergies:  Allergies  Allergen Reactions   Coconut Flavoring Agent (Non-Screening) Anaphylaxis   Medications:  Current Outpatient Medications:    azelastine (ASTELIN) 0.1 % nasal spray, Place 2 sprays into both nostrils 2 (two) times daily. Use in each nostril as directed, Disp: 30 mL, Rfl: 0   benzonatate (TESSALON) 100 MG capsule, Take 1 capsule (100 mg total) by mouth 2 (two) times daily as needed for cough., Disp: 20 capsule, Rfl: 0   busPIRone  (BUSPAR ) 7.5 MG tablet, Take 1 tablet (7.5 mg total) by mouth 2 (two) times daily. **NEEDS TO BE SEEN BEFORE NEXT  REFILL**, Disp: 180 tablet, Rfl: 1   DULoxetine  (CYMBALTA ) 20 MG capsule, Take 1 capsule (20 mg total) by mouth 2 (two) times daily., Disp: 180 capsule, Rfl: 0   Erenumab -aooe (AIMOVIG ) 140 MG/ML SOAJ, Inject 140 mg into the skin every 30 (thirty) days. **NEEDS TO BE SEEN BEFORE NEXT REFILL**, Disp: 1 mL, Rfl: 0   furosemide (LASIX) 20 MG tablet, Take 20 mg by mouth 4 (four) times daily., Disp: , Rfl:    NURTEC 75 MG TBDP, Take 1 tablet by mouth every other  day., Disp: , Rfl:    ondansetron  (ZOFRAN -ODT) 4 MG disintegrating tablet, DISSOLVE ONE TAB ON THE TONGUE EVERY EIGHT HOURS AS NEEDED FOR NAUSEA AND VOMITING, Disp: 60 tablet, Rfl: 5   OVER THE COUNTER MEDICATION, Womens daily vit, Disp: , Rfl:    OVER THE COUNTER MEDICATION, Hair, skin & nails, Disp: , Rfl:    OVER THE COUNTER MEDICATION, Glucosamine econdrodin, Disp: , Rfl:    rosuvastatin (CRESTOR) 10 MG tablet, Take 10 mg by mouth daily., Disp: , Rfl:    tirzepatide  (ZEPBOUND ) 15 MG/0.5ML Pen, Inject 15 mg into the skin once a week., Disp: 6 mL, Rfl: 4   Turmeric (QC TUMERIC COMPLEX PO), Take by mouth., Disp: , Rfl:    VENTOLIN  HFA 108 (90 Base) MCG/ACT inhaler, INHALE 1-2 PUFFS EVERY 4 HOURS AS NEEDED., Disp: 18 g, Rfl: 11   Vitamin D , Ergocalciferol , (DRISDOL ) 1.25 MG (50000 UNIT) CAPS capsule, Take 1 capsule (50,000 Units total) by mouth once a week., Disp: 5 capsule, Rfl: 6  Observations/Objective: Patient is well-developed, well-nourished in no acute distress.  Resting comfortably  at work in [place].  Head is normocephalic, atraumatic.  No labored breathing.  Speech is clear and coherent with logical content.  Patient is alert and oriented at baseline.    Assessment and Plan: 1. Upper respiratory tract infection, unspecified type (Primary)  Rec saline irrigation. Declined work note offer.   Follow Up Instructions: I discussed the assessment and treatment plan with the patient. The patient was provided an opportunity to ask questions and all were answered. The patient agreed with the plan and demonstrated an understanding of the instructions.  A copy of instructions were sent to the patient via MyChart unless otherwise noted below.   Patient has requested to receive PHI (AVS, Work Notes, etc) pertaining to this video visit through e-mail as they are currently without active MyChart. They have voiced understand that email is not considered secure and their health information could be  viewed by someone other than the patient.   The patient was advised to call back or seek an in-person evaluation if the symptoms worsen or if the condition fails to improve as anticipated.    [name], NP

## 2024-11-27 ENCOUNTER — Other Ambulatory Visit: Payer: Self-pay | Admitting: Family Medicine

## 2024-11-27 DIAGNOSIS — F411 Generalized anxiety disorder: Secondary | ICD-10-CM

## 2024-11-28 ENCOUNTER — Other Ambulatory Visit (HOSPITAL_COMMUNITY): Payer: Self-pay

## 2024-12-04 ENCOUNTER — Other Ambulatory Visit: Payer: Self-pay | Admitting: Family Medicine

## 2024-12-04 DIAGNOSIS — Z8669 Personal history of other diseases of the nervous system and sense organs: Secondary | ICD-10-CM

## 2024-12-05 NOTE — Telephone Encounter (Signed)
 Michelle pt NTBS 30-d given 11/07/24

## 2024-12-06 ENCOUNTER — Encounter: Payer: Self-pay | Admitting: Family Medicine

## 2024-12-06 NOTE — Telephone Encounter (Signed)
 Letter sent

## 2024-12-13 ENCOUNTER — Encounter: Payer: Self-pay | Admitting: Pharmacist

## 2024-12-13 ENCOUNTER — Telehealth: Payer: Self-pay | Admitting: Pharmacist

## 2024-12-13 ENCOUNTER — Other Ambulatory Visit (HOSPITAL_COMMUNITY): Payer: Self-pay

## 2024-12-13 NOTE — Telephone Encounter (Signed)
° °  PRIOR AUTHORIZATION Date: 12/13/2024 Medication: Wegovy  (semaglutide ) 0.5MG  WEEKLY Indication: Morbid Obesity BMI: 42.93 ICD-10 Code: E66.01 - Morbid (severe) obesity due to excess calories Prior Authorization Status: Completed by PharmD  Details:  Prior authorization for Wegovy  submitted for patient with BMI of 42.93, meeting criteria for obesity management. Documentation includes diagnosis, BMI, and clinical justification per payer requirements. Patient has a history of unsuccessful weight-loss attempts through lifestyle modifications, including: Dietary changes (calorie restriction, structured meal plans) Increased physical activity Behavioral counseling  Despite these interventions, patient continues to meet criteria for pharmacologic therapy. Patient notified of approval and next steps for medication initiation.  Plan: [place] Medicaid Healthy Blue  Coordinate prescription fulfillment through [employer]. Schedule follow-up for monitoring efficacy and tolerability.  Nykeria Mealing Dattero Rodriques Badie, PharmD, BCACP, CPP Clinical Pharmacist, [facility]

## 2024-12-20 ENCOUNTER — Other Ambulatory Visit (HOSPITAL_COMMUNITY): Payer: Self-pay

## 2024-12-23 ENCOUNTER — Encounter

## 2024-12-23 ENCOUNTER — Other Ambulatory Visit (HOSPITAL_COMMUNITY): Payer: Self-pay

## 2024-12-24 ENCOUNTER — Telehealth: Admitting: Nurse Practitioner

## 2024-12-24 DIAGNOSIS — J029 Acute pharyngitis, unspecified: Secondary | ICD-10-CM | POA: Diagnosis not present

## 2024-12-24 MED ORDER — LIDOCAINE VISCOUS HCL 2 % MT SOLN: 15.0000 mL | OROMUCOSAL | 1 refills | Status: AC | PRN

## 2024-12-24 NOTE — Progress Notes (Signed)
 We are sorry that you are not feeling well.  Here is how we plan to help!  Your symptoms indicate a likely viral infection (Pharyngitis).   Pharyngitis is inflammation in the back of the throat which can cause a sore throat, scratchiness and sometimes difficulty swallowing.   Pharyngitis is typically caused by a respiratory virus and will just run its course.  Please keep in mind that your symptoms could last up to 10 days.    For throat pain, we recommend over the counter oral pain relief medications such as acetaminophen or aspirin, or anti-inflammatory medications such as ibuprofen or naproxen  sodium.  Topical treatments such as oral throat lozenges or sprays may be used as needed. For throat pain, I have prescribed a Viscous Lidocaine  2% solution. Swallow 5-10 mL every 4-6 hours as needed for sore throat. DO NOT eat or drink anything for 15-20 minutes after swallowing to allow the medication to coat the throat.SABRA  Avoid close contact with loved ones, especially the very young and elderly.  Remember to wash your hands thoroughly throughout the day as this is the number one way to prevent the spread of infection! We also recommend that you periodically wipe down door knobs and counters with disinfectant.  After careful review of your answers, I would not recommend and antibiotic for your condition.  Antibiotics should not be used to treat conditions that we suspect are caused by viruses like the virus that causes the common cold or flu.  Providers prescribe antibiotics to treat infections caused by bacteria. Antibiotics are very powerful in treating bacterial infections when they are used properly.  To maintain their effectiveness, they should be used only when necessary.  Overuse of antibiotics has resulted in the development of super bugs that are resistant to treatment!    Some people with strep throat, however, can have atypical symptoms. As such, if anything continued to progress despite treatment  recommendations, you may need formal testing in clinic or office.  Home Care: Only take medications as instructed by your medical team. Do not drink alcohol while taking these medications. A steam or ultrasonic humidifier can help congestion.  You can place a towel over your head and breathe in the steam from hot water coming from a faucet. Avoid close contacts especially the very young and the elderly. Cover your mouth when you cough or sneeze. Always remember to wash your hands.  Get Help Right Away If: You develop worsening fever or throat pain. You develop a severe head ache or visual changes. Your symptoms persist after you have completed your treatment plan.  Make sure you Understand these instructions. Will watch your condition. Will get help right away if you are not doing well or get worse.  Your e-visit answers were reviewed by a board certified advanced clinical practitioner to complete your personal care plan.  Depending on the condition, your plan could have included both over the counter or prescription medications.  If there is a problem please reply once you have received a response from your provider.  Your safety is important to us .  If you have drug allergies check your prescription carefully.    You can use MyChart to ask questions about todays visit, request a non-urgent call back, or ask for a work or school excuse for 24 hours related to this e-Visit. If it has been greater than 24 hours you will need to follow up with your provider, or enter a new e-Visit to address those concerns.  You will get an e-mail in the next two days asking about your experience.  I hope that your e-visit has been valuable and will speed your recovery. Thank you for using e-visits.   I have spent 5 minutes in review of e-visit questionnaire, review and updating patient chart, medical decision making and response to patient.   Quintasha Gren W Jimmy Stipes, NP

## 2024-12-26 ENCOUNTER — Other Ambulatory Visit (HOSPITAL_COMMUNITY): Payer: Self-pay

## 2025-01-03 ENCOUNTER — Telehealth: Payer: Self-pay | Admitting: Pharmacist

## 2025-01-03 MED ORDER — ZEPBOUND 2.5 MG/0.5ML ~~LOC~~ SOAJ: 2.5000 mg | SUBCUTANEOUS | 0 refills | Status: AC

## 2025-01-04 ENCOUNTER — Telehealth: Payer: Self-pay | Admitting: Pharmacist

## 2025-01-04 NOTE — Telephone Encounter (Signed)
 New PA submitted  Reached out to member to see if new insurance available Would like to get her started back on Zepbound  She has lost 60lbs total  Mliss Tarry Griffin, PharmD, [place], CPP Clinical Pharmacist, [facility]

## 2025-01-16 NOTE — Telephone Encounter (Signed)
 Attempting to get Zepbound  for a patient, however her Medicaid is not active yet.  It is pending.  Will follow up next month
